# Patient Record
Sex: Male | Born: 1995 | Hispanic: No
Health system: Midwestern US, Community
[De-identification: ages and names within clinical notes are randomized; demographics above are authoritative.]

## PROBLEM LIST (undated history)

## (undated) ENCOUNTER — Emergency Department (HOSPITAL_COMMUNITY): Admission: EM | Payer: Self-pay

## (undated) DIAGNOSIS — J302 Other seasonal allergic rhinitis: Secondary | ICD-10-CM

## (undated) DIAGNOSIS — F201 Disorganized schizophrenia: Secondary | ICD-10-CM

## (undated) DIAGNOSIS — F5101 Primary insomnia: Secondary | ICD-10-CM

## (undated) HISTORY — PX: TONSILLECTOMY: SUR1361

---
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
## 2019-05-31 DIAGNOSIS — F29 Unspecified psychosis not due to a substance or known physiological condition: Secondary | ICD-10-CM

## 2019-05-31 HISTORY — DX: Unspecified psychosis not due to a substance or known physiological condition: F29

## 2019-06-14 NOTE — ED Notes (Signed)
 ED Patient Summary              [facility] Emergency Department  [address], [place] [address]  [phone]  Discharge Instructions (Patient)  _______________________________________     Name: Dale Maldonado, Dale Maldonado  DOB: 1995-11-21                   MRN: [id number]                   FIN: [id number]  Reason For Visit: WANTS SURGERY ON FINGERS- HAS NO PAIN  Final Diagnosis:      Visit Date: 06/14/2019 17:21:00  Address:   Phone:      Emergency Department Providers:         Primary Physician:            [facility] would like to thank you for allowing us  to assist you with your healthcare needs. The following includes patient education materials and information regarding your injury/illness.     Follow-up Instructions:  You were seen today on an emergency basis. Please contact your primary care doctor for a follow up appointment. If you received a referral to a specialist doctor, it is important you follow-up as instructed.    It is important that you call your follow-up doctor to schedule and confirm the location of your next appointment. Your doctor may practice at multiple locations. The office location of your follow-up appointment may be different to the one written on your discharge instructions.    If you do not have a primary care doctor, please call [phone] for help in finding a [facility] Provider. For help in finding a specialist doctor, please call [phone].    The [facility] "Ask a Nurse" line in staffed by Registered Nurses and is a free service to the community. We are available Monday - Friday from 8am to 5pm to answer your questions about your health. Please call [phone].    If your condition gets worse before your follow-up with your primary care doctor or specialist, please return to the Emergency Department.        Coronavirus 2019 (COVID-19) Reminders:     Patients 65 and older can contact their [facility] doctors' offices to schedule appointments to receive the COVID-19 vaccine at the [facility] or send us  an email at [email or web address].            Scan this code with your phone camera to send an email to the address above.      Patients who are 81 and older who do not have a [facility] physician can call [phone] to schedule vaccination appointments.         Follow Up Appointments:  Primary Care Provider:      Name: PCP,  NONE      Phone:           Printed Prescriptions:    Patient Education Materials:  Discharge Orders       Comment:             Allergy Info:      Medication Information:  [facility] provided you with a complete list of medications post discharge, if you have been instructed to stop taking a medication please ensure you also follow up with this information to your Primary Care Physician.  Unless otherwise noted, patient will continue to  take medications as prescribed prior to the Emergency Room visit.  Any specific questions regarding your chronic medications and dosages should be discussed with your physician(s) and pharmacist.          No Medications Documented      Medications Administered During Visit:       Major Tests and Procedures:  The following procedures and tests were performed during your ED visit.  COMMON PROCEDURES%>  COMMON PROCEDURES COMMENTS%>          Laboratory Orders  No laboratory orders were placed.              Radiology Orders  No radiology orders were placed.              Patient Care Orders  Name Status Details   ED Triage Adult Ordered 06/14/19 17:21:51 EST, 06/14/19 17:21:51 EST       ---------------------------------------------------------------------------------------------------------------------  [facility]) encourages you to self-enroll in the [facility] Patient Portal.  [facility] Patient Portal will allow you to manage your personal health information  securely from your own electronic device now and in the future.  To begin your Patient Portal enrollment process, please visit [email or web address] Click on "Sign up now" under [facility].  If you find that you need additional assistance on the [facility] Patient Portal or need a copy of your medical records, please call the [facility] Medical Records Office at [phone].  Comment:

## 2019-06-14 NOTE — ED Notes (Signed)
ED Patient Education Note     Patient Education Materials Follows:

## 2019-06-14 NOTE — Discharge Summary (Signed)
ED Clinical Summary                     [facility]  [address]  [place], [place] [address]  [phone]          PERSON INFORMATION  Name: Dale Maldonado, Dale Maldonado Age:  24 Years DOB: 1995/10/22   Sex: Male Language:  PCP: PCP,  NONE   Marital Status:  Phone:  Med Service: MED-Medicine   MRN: [id number] Acct# [id number] Arrival: 06/14/2019 17:21:00   Visit Reason: WANTS SURGERY ON FINGERS- HAS NO PAIN Acuity:  LOS: 000 00:13   Address:       Diagnosis:      Medications:    Medications Administered During Visit:              Allergies      No Allergies Documented      Major Tests and Procedures:  The following procedures and tests were performed during your ED visit.  COMMON PROCEDURES%>  COMMON PROCEDURES COMMENTS%>                PROVIDER INFORMATION     Attending Physician:  DEFAULT,  DOCTOR      Admit Doc  DEFAULT,  DOCTOR     Consulting Doc       VITALS INFORMATION  Vital Sign Triage Latest   Temp Oral ORAL_1%> ORAL%>   Temp Temporal TEMPORAL_1%> TEMPORAL%>   Temp Intravascular INTRAVASCULAR_1%> INTRAVASCULAR%>   Temp Axillary AXILLARY_1%> AXILLARY%>   Temp Rectal RECTAL_1%> RECTAL%>   02 Sat     Respiratory Rate RATE_1%> RATE%>   Peripheral Pulse Rate PULSE RATE_1%> PULSE RATE%>   Apical Heart Rate HEART RATE_1%> HEART RATE%>   Blood Pressure BLOOD PRESSURE_1%>/ BLOOD PRESSURE_1%> BLOOD PRESSURE%> / BLOOD PRESSURE%>                 Immunizations      No Immunizations Documented This Visit          DISCHARGE INFORMATION   Discharge Disposition: L Left Without Treatment   Discharge Location:  Left prior to triage   Discharge Date and Time:  06/14/2019 17:34:10   ED Checkout Date and Time:  06/14/2019 17:34:10     DEPART REASON INCOMPLETE INFORMATION               Depart Action Incomplete Reason   Interactive View/I&O Patient left prior to Dr exam   Diagnosis Patient left prior to Dr exam   Patient Education Patient left prior to Dr exam   Follow-up Patient left prior to Dr exam   Nursing  Admit/Discharge/Transfer Summary Patient left prior to Dr exam   Patient Understanding Patient left prior to Dr exam               Problems      No Problems Documented              Smoking Status      No Smoking Status Documented         PATIENT EDUCATION INFORMATION  Instructions:          Follow up:            ED PROVIDER DOCUMENTATION

## 2019-06-15 NOTE — ED Notes (Signed)
ED Triage Note       ED Secondary Triage Entered On:  06/15/2019 23:58 EST    Performed On:  06/15/2019 23:57 EST by Cliffton Asters, RN, Reola Mosher               General Information   Barriers to Learning :   Lack of insight   ED Home Meds Section :   Document assessment   [facility] ED Fall Risk Section :   Document assessment   ED Advance Directives Section :   Document assessment   ED Palliative Screen :   N/A (prefilled for <23yo)   Cliffton Asters RN, Reola Mosher - 06/15/2019 23:57 EST   (As Of: 06/15/2019 23:58:18 EST)   Diagnoses(Active)    Psychiatric problem  Date:   06/15/2019 ; Diagnosis Type:   Reason For Visit ; Confirmation:   Complaint of ; Clinical Dx:   Psychiatric problem ; Classification:   Medical ; Clinical Service:   Emergency medicine ; Code:   PNED ; Probability:   0 ; Diagnosis Code:   [id number]             -    Procedure History   (As Of: 06/15/2019 23:58:18 EST)     Phoebe Perch Fall Risk Assessment Tool   Hx of falling last 3 months ED Fall :   No   Patient confused or disoriented ED Fall :   No   Patient intoxicated or sedated ED Fall :   No   Patient impaired gait ED Fall :   No   Use a mobility assistance device ED Fall :   No   Patient altered elimination ED Fall :   No   [facility] ED Fall Score :   0    Guss Bunde - 06/15/2019 23:57 EST   ED Advance Directive   Advance Directive :   No   White, RN, Reola Mosher - 06/15/2019 23:57 EST   Med Hx   Medication List   (As Of: 06/15/2019 23:58:18 EST)   Normal Order    diphenhydrAMINE 50 mg/mL Inj Soln 1 mL  :   diphenhydrAMINE 50 mg/mL Inj Soln 1 mL ; Status:   Ordered ; Ordered As Mnemonic:   Benadryl ; Simple Display Line:   25 mg, 0.5 mL, IM, Once ; Ordering Provider:   Lucia Estelle E; Catalog Code:   diphenhydrAMINE ; Order Dt/Tm:   06/15/2019 22:28:41 EST          haloperidol 5 mg/mL Inj Soln 1 mL  :   haloperidol 5 mg/mL Inj Soln 1 mL ; Status:   Ordered ; Ordered As Mnemonic:   Haldol ; Simple Display Line:   5 mg, 1 mL, IM, Once ;  Ordering Provider:   Lucia Estelle E; Catalog Code:   haloperidol ; Order Dt/Tm:   06/15/2019 22:28:10 EST          LORazepam 2 mg/mL Inj Soln 1 mL  :   LORazepam 2 mg/mL Inj Soln 1 mL ; Status:   Ordered ; Ordered As Mnemonic:   Ativan ; Simple Display Line:   1 mg, 0.5 mL, IM, Once ; Ordering Provider:   Lucia Estelle E; Catalog Code:   LORazepam ; Order Dt/Tm:   06/15/2019 22:28:10 EST
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
## 2019-06-16 LAB — CBC WITH AUTO DIFFERENTIAL
Hematocrit: 46.2 % (ref 38.0–52.0)
Hemoglobin: 16 g/dL (ref 13.0–17.3)
MCH: 30.2 pg (ref 27.0–34.5)
MCHC: 34.6 g/dL (ref 32.0–36.0)
MCV: 87.2 fL (ref 84.0–100.0)
MPV: 11.1 fL (ref 7.2–13.2)
NRBC Absolute: 0 10*3/uL (ref 0.000–0.012)
NRBC Automated: 0 % (ref 0.0–0.2)
Platelets: 249 10*3/uL (ref 140–440)
RBC: 5.3 x10e6/mcL (ref 4.00–5.60)
RDW: 12.4 % (ref 11.0–16.0)
WBC: 4.3 10*3/uL (ref 3.8–10.6)

## 2019-06-16 LAB — BASIC METABOLIC PANEL
Anion Gap: 12 mmol/L (ref 2–17)
BUN: 9 mg/dL (ref 6–20)
CO2: 26 mmol/L (ref 22–29)
Calcium: 9.3 mg/dL (ref 8.6–10.0)
Chloride: 102 mmol/L (ref 98–107)
Creatinine: 0.6 mg/dL — ABNORMAL LOW (ref 0.7–1.3)
GFR African American: 164 mL/min/{1.73_m2} (ref >=90)
GFR Non-African American: 142 mL/min/{1.73_m2} (ref >=90)
Glucose: 86 mg/dL (ref 70–99)
OSMOLALITY CALCULATED: 277 mOsm/kg (ref 270–287)
Potassium: 3.8 mmol/L (ref 3.5–5.3)
Sodium: 140 mmol/L (ref 135–145)

## 2019-06-16 LAB — URINE DRUG SCREEN
Amphetamine Screen, Urine: NEGATIVE
Barbiturate Screen, Ur: NEGATIVE
Benzodiazepine Screen, Urine: NEGATIVE
Cannabinoid Scrn, Ur: NEGATIVE
Cocaine Screen Urine: NEGATIVE
Opiate Screen, Urine: NEGATIVE

## 2019-06-16 LAB — HBSAG: Hepatitis B Surface Ag: NONREACTIVE

## 2019-06-16 LAB — HIV 1/2 AG/AB COMBO: HIV Antigen Antibody: NONREACTIVE

## 2019-06-16 LAB — HEPATITIS B CORE ANTIBODY, IGM: Hep B Core Ab, IgM: NONREACTIVE

## 2019-06-16 LAB — HEPATITIS C ANTIBODY: Hep C Virus Ab: NONREACTIVE

## 2019-06-16 LAB — ETHANOL: Ethanol Lvl: 42.8 mg/dL — ABNORMAL HIGH (ref 0.0–10.0)

## 2019-06-16 LAB — HEPATITIS A ANTIBODY, IGM: Hep A IgM: NONREACTIVE

## 2019-06-16 NOTE — ED Notes (Signed)
 ED Note-Nursing               Pt became agitated when he asked to leave and he was told that he can not leave at this time.  Pt stated   Fine then, I'm just going to kill myself if y'all don't let me leave.  Pt denied a plan for SI; EOC conducted, and security at bedside aware of his verbal statement.  Case management notified of his statement.   Signature [employer]

## 2019-06-16 NOTE — ED Provider Notes (Signed)
Addendum *ED        Patient:   Dale Maldonado, Dale Maldonado             MRN: [id number]            FIN: [id number]               Age:   24 years     Sex:  Male     DOB:  11/28/1995   Associated Diagnoses:   Psychoses   Author:   Caleb Popp      Basic Information   Addendum: Time of addendum:: 06/16/2019 16:06:00 , Assumed care from: Kevin Fenton, Time  06/16/2019 06:00:00, Pertinent history: Patient awaiting consult.      Medical Decision Making   Results review:  Lab results : Lab View   06/15/2019 23:45 EST Estimated Creatinine Clearance 159.79 mL/min   06/15/2019 23:08 EST WBC 4.3 x10e3/mcL    RBC 5.30 x10e6/mcL    Hgb 16.0 g/dL    HCT 46.2 %    MCV 87.2 fL    MCH 30.2 pg    MCHC 34.6 g/dL    RDW 12.4 %    Platelet 249 x10e3/mcL    MPV 11.1 fL    NRBC Absolute Auto 0.000 x10e3/mcL    NRBC Percent Auto 0.0 %    Sodium Lvl 140 mmol/L    Potassium Lvl 3.8 mmol/L    Chloride 102 mmol/L    CO2 26 mmol/L    Glucose Random 86 mg/dL    BUN 9 mg/dL    Creatinine Lvl 0.6 mg/dL  LOW    AGAP 12 mmol/L    Osmolality Calc 277 mOsm/kg    Calcium Lvl 9.3 mg/dL    eGFR AA 164 mL/min/1.24m???    eGFR Non-AA 142 mL/min/1.747m??    Hep A IGM Ab Non-Reactive    HepBCore IGM Ab Non-Reactive    Hep B Surf Ag Non-Reactive    Hep C Ab Non-Reactive    HIV AG AB Nonreactive    Ethanol Lvl 42.8 mg/dL  HI    Amphetamine U Negative    Barbiturate U Negative    Benzodiazepin U Negative    Cannabinoid U Negative    Cocaine U Negative    Opiate U Negative   .      Reexamination/ Reevaluation   Vital signs   Basic Oxygen Information   06/16/2019 13:41 EST Oxygen Therapy Room air    SpO2 100 %   06/16/2019 7:50 EST Oxygen Therapy Room air    SpO2 100 %   06/15/2019 23:58 EST Oxygen Therapy Room air   06/15/2019 22:21 EST Oxygen Therapy Room air    SpO2 100 %      This patient with a known history of schizophrenia presented to the emergency room last night at which time he was found to be combative and agitated on part 1 papers from mobile crisis.  Patient had to  receive 25 mg of Benadryl parenterally, 1 mg of Haldol parenterally and otherwise 1 mg of Ativan parenterally.  Patient calmed down through the night and was assessed by psychiatrist on call including Dr. CoZola Buttonho felt that the patient lacked insight and judgment otherwise with noncompliance of medications at home.  She wanted the patient committed and the patient was committed however we were unable to find a facility on this patient i as he is uninsured.  Patient will be kept under observation until reevaluation in the morning for placement.  At the time of this note patient has part 1 commitment papers in part 2 commitment papers were done by me.      Impression and Plan   Diagnosis   Psychoses (ICD10-CM F29, Discharge, Medical)   Plan   Condition: Stable.    Disposition: Patient care transitioned to: Time: 06/16/2019 16:09:00, PARASCHOS-MD,  THEODORE.    Signature Line     Electronically Signed on 06/16/2019 04:09 PM EST   ________________________________________________   Caleb Popp

## 2019-06-16 NOTE — Case Communication (Signed)
 CM Discharge Planning Assessment - Text       CM Progress Note Entered On:  06/16/2019 18:37 EST    Performed On:  06/16/2019 18:25 EST by [facility]ERW[facility]ICE               CM Progress Note   CM Home/Lay Caregiver Name/Relationship :   Dale Maldonado  Dale Maldonado-[phone]   CM Progress Note :   06/16/2019 KJM/MSW consultation requested by MD for disposition of care needs.  Patient reportedly presenting from home with increased aggression and altered mental status.  Patient assessed by Dr. Heyward and placement indicated.  MSW initiated bed search and spoke with the patient's parents.  Mother and Father reporting confusion about the patient's behavior, talking out of his head, running away from the house, increased aggression and irritability.  MSW inquired if there had been a history of similar presentation which initially was denied but Father then did note a diagosis of Schizophrenia in the past and a hospitalization.  MSW informed them of MD plan of care.  Family asking about long term length of stay and MSW indicated stabilization is generally short term.     MSW unable to secure a bed for the patient.  [facility] had no beds and 12 holding in their ED.  [facility] was not able to review the chart because of their staffing issues.  No response from other facilities.  Patient is unfunded which is a barrier for placement as many facilities will not accept charity placement.  Will continue to work on placement.  Patient notified and parents updated that patient will remain in the ED overnight.  Further disposition pending.     Dale Maldonado - 06/16/2019 18:25 EST

## 2019-06-16 NOTE — ED Notes (Signed)
ED Note-Nursing               Per night shift hand off communication, pt was agreeable with staff in ED and was not given medications; provider notified by staff over night.    Signature [employer]

## 2019-06-17 LAB — COVID-19, SURVEILLANCE (ASYMPTOMATIC/NO EXPOSURE, OR TEST OF CURE)
Lot/Kit Number: 706271
SARS Cov2 Ag FIA: NEGATIVE

## 2019-06-17 NOTE — ED Provider Notes (Signed)
Addendum *ED        Patient:   Dale Maldonado, Dale Maldonado             MRN: [id number]            FIN: [id number]               Age:   24 years     Sex:  Male     DOB:  11/10/1995   Associated Diagnoses:   None   Author:   Yolanda Manges      Basic Information   Addendum: Time of addendum:: 06/17/2019 23:05:00 .      Medical Decision Making   Notes:  Pt still awaiting psych placement.  He is in no distress and will be watched overnight.[employer]ed forces training and education officer Line     Electronically Signed on 06/17/2019 11:06 PM EST   ________________________________________________   Yolanda Manges

## 2019-06-17 NOTE — ED Provider Notes (Signed)
Addendum *ED        Patient:   Dale Maldonado, Dale Maldonado             MRN: [id number]            FIN: [id number]               Age:   24 years     Sex:  Male     DOB:  May 19, 1995   Associated Diagnoses:   None   Author:   Kassie Mends      Basic Information   Addendum: Pertinent history: Patient signed out to me awaiting placement for psychiatric care no complaints through the night as such patient.to my successor pending case management and psychiatric disposition plan on part 1 part 2.      Reexamination/ Reevaluation   Vital signs   Basic Oxygen Information   06/16/2019 13:41 EST Oxygen Therapy Room air    SpO2 100 %   06/16/2019 7:50 EST Oxygen Therapy Room air    SpO2 100 %      Signature Line     Electronically Signed on 06/17/2019 01:47 AM EST   ________________________________________________   Kassie Mends, MD

## 2019-06-17 NOTE — Case Communication (Signed)
 CM Discharge Planning Assessment - Text       CM Progress Note Entered On:  06/17/2019 19:28 EST    Performed On:  06/17/2019 19:24 EST by [facility]               CM Progress Note   CM Home/Lay Caregiver Name/Relationship :   Orval Dortch  Zenineh Ochsner-Mother-[phone]   CM Progress Note :   06/16/2019 KJM/MSW consultation requested by MD for disposition of care needs.  Patient reportedly presenting from home with increased aggression and altered mental status.  Patient assessed by Dr. Heyward and placement indicated.  MSW initiated bed search and spoke with the patient's parents.  Mother and Father reporting confusion about the patient's behavior, talking out of his head, running away from the house, increased aggression and irritability.  MSW inquired if there had been a history of similar presentation which initially was denied but Father then did note a diagosis of Schizophrenia in the past and a hospitalization.  MSW informed them of MD plan of care.  Family asking about long term length of stay and MSW indicated stabilization is generally short term.     MSW unable to secure a bed for the patient.  [facility] had no beds and 12 holding in their ED.  [facility] was not able to review the chart because of their staffing issues.  No response from other facilities.  Patient is unfunded which is a barrier for placement as many facilities will not accept charity placement.  Will continue to work on placement.  Patient notified and parents updated that patient will remain in the ED overnight.  Further disposition pending.    06/17/2019 KJM/MSW follow up  consultation for placement of patient.  MSW resent referrals.  MSW spoke with [facility] and [facility], neither of them have any beds.  Options are limited due to lack of funding but will continue efforts.    Extensive phone calls this morning with the patient's father and then the patient's Mother.  MSW inquired if there had a concern about drugs in  the past and Father thought there may have been but he wasn't certain.  MSW inquired about symptoms:  He noted that the patient has no feelings one way or the other-noting lack of response when a friend died; he described the patient's moods as erratic and variable; he noted the impulsivity, poor judgement and elopement; he was not aware of any active hallucinations. He reported that over the past few weeks the patient's personal hygiene has declined and he will wear the same dirty clothes all week.  He reportedly will not bathe or brush his teeth.  Father denied that they were fearful for their safety in the home and reported that the patient has not been violent with them.  He continues to support placement and medications; MSW addressed limitations and with regard to medication noted, addressed how ownership of medication compliance will be with the patient which has posed problems in the past for compliance.  Patient's Mother continues to plead to see her son and MSW updates as often as able sometimes 3 times in the day.  MSW reviewed efforts to maintain patient's calm environment and paperwork.  He again notes that there was no indication of any of these behaviors when the patient was younger and references back to early adulthood with the onset.  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  MSW provided education and support regarding this diagnosis as the patient has refused medications in the past.    The patient remains evasive about his situation and presenting issues.  He was able to deescalated and has a moderate level of agitation/irritability.    1309 KJM/MSW follow up with [facility]; they have no beds and have one unit closed.  [facility]; they do not have any beds; [facility]: no beds.  [facility]-requested them to assess patient.  [facility]-no response.  Limitations are funding and lack of available beds.    1840 Bed situation reassessed in the  afternoon and again in the evening.  Patient restless about on going delays and assessed again by Dr. Andrey, who also conferred with his Father who came to the hospital.  Patient noted to be responding to internal stimuli during MD assessment this evening, which had not been noted earlier.  He continues to deny that he has any mental health issues and that he just was trying to leave this parent's home because they are so controlling.  However, the patient can not produce any other reliable party and no safety plan able to establish.  Per MD request, CM resubmitted referrals again to [facility], [facility] and [facility].  [facility] again has no beds and neither does [facility].  [facility] considering but family requesting closer to home.  Awaiting follow up from [facility] and then will notify family of acceptances.  Further disposition pending.     1924 [facility] has no beds and although [facility] has no charity beds, the patient's Father had thought he could do self pay. However, [facility] requires $4000.00 down and $980 a day; the upfront costs are not an option for the patient's Father who works as a Lawyer.  He has a PhD in Building surveyor but he is retired.  MSW noted that we would explore the options for beds.  Family remains focused on beds in [place] despite CM efforts to educate them regarding the limitations therein.  SBAR to Dr. Conny, Dr. Andrey and CM Dagoberto for further coordination of care needs.      [name],  [name] - 06/17/2019 19:24 EST

## 2019-06-17 NOTE — Case Communication (Signed)
 CM Discharge Planning Assessment - Text       CM Progress Note Entered On:  06/17/2019 10:48 EST    Performed On:  06/17/2019 10:35 EST by [facility]               CM Progress Note   CM Home/Lay Caregiver Name/Relationship :   Dale Maldonado  Dale Maldonado-[phone]   CM Progress Note :   06/16/2019 KJM/MSW consultation requested by MD for disposition of care needs.  Patient reportedly presenting from home with increased aggression and altered mental status.  Patient assessed by Dr. Heyward and placement indicated.  MSW initiated bed search and spoke with the patient's parents.  Mother and Father reporting confusion about the patient's behavior, talking out of his head, running away from the house, increased aggression and irritability.  MSW inquired if there had been a history of similar presentation which initially was denied but Father then did note a diagosis of Schizophrenia in the past and a hospitalization.  MSW informed them of MD plan of care.  Family asking about long term length of stay and MSW indicated stabilization is generally short term.     MSW unable to secure a bed for the patient.  [facility] had no beds and 12 holding in their ED.  [facility] was not able to review the chart because of their staffing issues.  No response from other facilities.  Patient is unfunded which is a barrier for placement as many facilities will not accept charity placement.  Will continue to work on placement.  Patient notified and parents updated that patient will remain in the ED overnight.  Further disposition pending.    06/17/2019 KJM/MSW follow up  consultation for placement of patient.  MSW resent referrals.  MSW spoke with [facility] and [facility], neither of them have any beds.  Options are limited due to lack of funding but will continue efforts.    Extensive phone calls this morning with the patient's father and then the patient's Mother.  MSW inquired if there had a concern about drugs in  the past and Father thought there may have been but he wasn't certain.  MSW inquired about symptoms:  He noted that the patient has no feelings one way or the other-noting lack of response when a friend died; he described the patient's moods as erratic and variable; he noted the impulsivity, poor judgement and elopement; he was not aware of any active hallucinations. He reported that over the past few weeks the patient's personal hygiene has declined and he will wear the same dirty clothes all week.  He reportedly will not bathe or brush his teeth.  Father denied that they were fearful for their safety in the home and reported that the patient has not been violent with them.  He continues to support placement and medications; MSW addressed limitations and with regard to medication noted, addressed how ownership of medication compliance will be with the patient which has posed problems in the past for compliance.  Patient's Mother continues to plead to see her son and MSW updates as often as able sometimes 3 times in the day.  MSW reviewed efforts to maintain patient's calm environment and paperwork.  He again notes that there was no indication of any of these behaviors when the patient was younger and references back to early adulthood with the onset.  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  MSW provided education and support regarding this diagnosis as the patient has refused medications in the past.    The patient remains evasive about his situation and presenting issues.  He was able to deescalated and has a moderate level of agitation/irritability.     Dale Maldonado - 06/17/2019 10:35 EST

## 2019-06-17 NOTE — Case Communication (Signed)
 CM Discharge Planning Assessment - Text       CM Progress Note Entered On:  06/17/2019 13:11 EST    Performed On:  06/17/2019 13:09 EST by [facility] SERV[facility]               CM Progress Note   CM Home/Lay Caregiver Name/Relationship :   Warwick Nick  Zenineh Gesner-Mother-[phone]   CM Progress Note :   06/16/2019 KJM/MSW consultation requested by MD for disposition of care needs.  Patient reportedly presenting from home with increased aggression and altered mental status.  Patient assessed by Dr. Heyward and placement indicated.  MSW initiated bed search and spoke with the patient's parents.  Mother and Father reporting confusion about the patient's behavior, talking out of his head, running away from the house, increased aggression and irritability.  MSW inquired if there had been a history of similar presentation which initially was denied but Father then did note a diagosis of Schizophrenia in the past and a hospitalization.  MSW informed them of MD plan of care.  Family asking about long term length of stay and MSW indicated stabilization is generally short term.     MSW unable to secure a bed for the patient.  [facility] had no beds and 12 holding in their ED.  [facility] was not able to review the chart because of their staffing issues.  No response from other facilities.  Patient is unfunded which is a barrier for placement as many facilities will not accept charity placement.  Will continue to work on placement.  Patient notified and parents updated that patient will remain in the ED overnight.  Further disposition pending.    06/17/2019 KJM/MSW follow up  consultation for placement of patient.  MSW resent referrals.  MSW spoke with [facility] and [facility], neither of them have any beds.  Options are limited due to lack of funding but will continue efforts.    Extensive phone calls this morning with the patient's father and then the patient's Mother.  MSW inquired if there had a concern about drugs in  the past and Father thought there may have been but he wasn't certain.  MSW inquired about symptoms:  He noted that the patient has no feelings one way or the other-noting lack of response when a friend died; he described the patient's moods as erratic and variable; he noted the impulsivity, poor judgement and elopement; he was not aware of any active hallucinations. He reported that over the past few weeks the patient's personal hygiene has declined and he will wear the same dirty clothes all week.  He reportedly will not bathe or brush his teeth.  Father denied that they were fearful for their safety in the home and reported that the patient has not been violent with them.  He continues to support placement and medications; MSW addressed limitations and with regard to medication noted, addressed how ownership of medication compliance will be with the patient which has posed problems in the past for compliance.  Patient's Mother continues to plead to see her son and MSW updates as often as able sometimes 3 times in the day.  MSW reviewed efforts to maintain patient's calm environment and paperwork.  He again notes that there was no indication of any of these behaviors when the patient was younger and references back to early adulthood with the onset.  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  MSW provided education and support regarding this diagnosis as the patient has refused medications in the past.    The patient remains evasive about his situation and presenting issues.  He was able to deescalated and has a moderate level of agitation/irritability.    1309 KJM/MSW follow up with [facility]; they have no beds and have one unit closed.  [facility]; they do not have any beds; [facility]: no beds.  [facility]-requested them to assess patient.  [facility]-no response.  Limitations are funding and lack of available beds.     [name],  [name] - 06/17/2019 13:09 EST

## 2019-06-17 NOTE — ED Notes (Signed)
ED Note-Nursing       ED RN Reassessment Entered On:  06/17/2019 19:06 EST    Performed On:  06/17/2019 19:05 EST by Milagros Evener, RN, BLAIR A               ED RN Reassessment   ED RN Progress Note :   Report from [place], [place], assumed care of pt.   Milagros Evener, RN, BLAIR A - 06/17/2019 19:05 EST

## 2019-06-17 NOTE — Case Communication (Signed)
 CM Discharge Planning Assessment - Text       CM Progress Note Entered On:  06/17/2019 18:45 EST    Performed On:  06/17/2019 18:39 EST by [facility] SERV[facility]               CM Progress Note   CM Home/Lay Caregiver Name/Relationship :   Arkel Cartwright  Zenineh Ratchford-Mother-[phone]   CM Progress Note :   06/16/2019 KJM/MSW consultation requested by MD for disposition of care needs.  Patient reportedly presenting from home with increased aggression and altered mental status.  Patient assessed by Dr. Heyward and placement indicated.  MSW initiated bed search and spoke with the patient's parents.  Mother and Father reporting confusion about the patient's behavior, talking out of his head, running away from the house, increased aggression and irritability.  MSW inquired if there had been a history of similar presentation which initially was denied but Father then did note a diagosis of Schizophrenia in the past and a hospitalization.  MSW informed them of MD plan of care.  Family asking about long term length of stay and MSW indicated stabilization is generally short term.     MSW unable to secure a bed for the patient.  [facility] had no beds and 12 holding in their ED.  [facility] was not able to review the chart because of their staffing issues.  No response from other facilities.  Patient is unfunded which is a barrier for placement as many facilities will not accept charity placement.  Will continue to work on placement.  Patient notified and parents updated that patient will remain in the ED overnight.  Further disposition pending.    06/17/2019 KJM/MSW follow up  consultation for placement of patient.  MSW resent referrals.  MSW spoke with [facility] and [facility], neither of them have any beds.  Options are limited due to lack of funding but will continue efforts.    Extensive phone calls this morning with the patient's father and then the patient's Mother.  MSW inquired if there had a concern about drugs in  the past and Father thought there may have been but he wasn't certain.  MSW inquired about symptoms:  He noted that the patient has no feelings one way or the other-noting lack of response when a friend died; he described the patient's moods as erratic and variable; he noted the impulsivity, poor judgement and elopement; he was not aware of any active hallucinations. He reported that over the past few weeks the patient's personal hygiene has declined and he will wear the same dirty clothes all week.  He reportedly will not bathe or brush his teeth.  Father denied that they were fearful for their safety in the home and reported that the patient has not been violent with them.  He continues to support placement and medications; MSW addressed limitations and with regard to medication noted, addressed how ownership of medication compliance will be with the patient which has posed problems in the past for compliance.  Patient's Mother continues to plead to see her son and MSW updates as often as able sometimes 3 times in the day.  MSW reviewed efforts to maintain patient's calm environment and paperwork.  He again notes that there was no indication of any of these behaviors when the patient was younger and references back to early adulthood with the onset.  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  MSW provided education and support regarding this diagnosis as the patient has refused medications in the past.    The patient remains evasive about his situation and presenting issues.  He was able to deescalated and has a moderate level of agitation/irritability.    1309 KJM/MSW follow up with [facility]; they have no beds and have one unit closed.  [facility]; they do not have any beds; [facility]: no beds.  [facility]-requested them to assess patient.  [facility]-no response.  Limitations are funding and lack of available beds.    1840 Bed situation reassessed in the  afternoon and again in the evening.  Patient restless about on going delays and assessed again by Dr. Andrey, who also conferred with his Father who came to the hospital.  Patient noted to be responding to internal stimuli during MD assessment this evening, which had not been noted earlier.  He continues to deny that he has any mental health issues and that he just was trying to leave this parent's home because they are so controlling.  However, the patient can not produce any other reliable party and no safety plan able to establish.  Per MD request, CM resubmitted referrals again to [facility] and [facility].  [facility] again has no beds and neither does [facility].  [facility] considering but family requesting closer to home.  Awaiting follow up from [facility] and then will notify family of acceptances.  Further disposition pending.      [name],  [name] - 06/17/2019 18:39 EST

## 2019-06-17 NOTE — ED Notes (Signed)
ED Note-Nursing       ED RN Reassessment Entered On:  06/17/2019 20:40 EST    Performed On:  06/17/2019 20:40 EST by Milagros Evener, RN, BLAIR A               ED RN Reassessment   ED Patient condition :   No change from previous assessment   ED Behavioral Activity Rating Scale :   4 - Quiet and awake (normal level of activity)   ED RN Progress Note :   Sandwich, chips and drink provided at pt request.   Milagros Evener, RN, BLAIR A - 06/17/2019 20:40 EST

## 2019-06-17 NOTE — ED Provider Notes (Signed)
Addendum *ED        Patient:   Dale Maldonado, Dale Maldonado             MRN: [id number]            FIN: [id number]               Age:   24 years     Sex:  Male     DOB:  [birth year]   Associated Diagnoses:   None   Author:   Grayce Sessions,  Braiden Presutti HOWARD      Medical Decision Making   Patient was signed out to me change of shift pending the completion formal psychiatric evaluation.  Patient was seen 2 times over the course of the day by Dr. Janina Mayo from department of psychiatry and it was felt that he does require inpatient placement.  Patient is under a part 1 and part 2 commitment.  Patient has not required additional medication however his behavior has become somewhat labile.  Ultimately patient was signed out to oncoming physician pending bed search.      Reexamination/ Reevaluation   Vital signs   Basic Oxygen Information   06/17/2019 8:00 EST Oxygen Therapy Room air    SpO2 99 %   06/17/2019 2:08 EST Oxygen Therapy Room air    SpO2 100 %   06/16/2019 13:41 EST Oxygen Therapy Room air    SpO2 100 %   06/16/2019 7:50 EST Oxygen Therapy Room air    SpO2 100 %      Signature Line     Electronically Signed on 06/17/2019 03:54 PM EST   ________________________________________________   Grayce Sessions,  Dolan Xia HOWARD

## 2019-06-18 NOTE — ED Provider Notes (Signed)
Addendum *ED        Patient:   BOLTON, CANUPP             MRN: [id number]            FIN: [id number]               Age:   24 years     Sex:  Male     DOB:  1995/08/04   Associated Diagnoses:   Psychoses   Author:   Jessy Oto      Medical Decision Making   Patient was signed out to me at change of pending completion of bed search.  This patient unfortunately is on his third day of awaiting bed search at the present time.  Unfortunately no beds are available at the present time and after patient was seen by psychiatry earlier today he was deemed to be too labile for discharge to home.  At this time the patient will be signed out to oncoming physician for further management.      Reexamination/ Reevaluation   Vital signs   Basic Oxygen Information   06/18/2019 8:21 EST Oxygen Therapy Room air    SpO2 100 %   06/17/2019 19:24 EST Oxygen Therapy Room air    SpO2 99 %   06/17/2019 8:00 EST Oxygen Therapy Room air    SpO2 99 %   06/17/2019 2:08 EST Oxygen Therapy Room air    SpO2 100 %         Impression and Plan   Diagnosis   Psychoses (ICD10-CM F29, Discharge, Medical)   Signature Line     Electronically Signed on 06/18/2019 03:19 PM EST   ________________________________________________   Grayce Sessions,  Keondra Haydu HOWARD

## 2019-06-18 NOTE — Case Communication (Signed)
 CM Discharge Planning Assessment - Text       CM Progress Note Entered On:  06/18/2019 11:30 EST    Performed On:  06/18/2019 11:27 EST by Dagoberto Fore Corinne               CM Progress Note   CM Home/Lay Caregiver Name/Relationship :   Osman Blinder-Father-[phone]  Zenineh Yjddjw-Fnuyzm-[phone]   CM Progress Note :   06/16/2019 KJM/MSW consultation requested by MD for disposition of care needs.  Patient reportedly presenting from home with increased aggression and altered mental status.  Patient assessed by Dr. Heyward and placement indicated.  MSW initiated bed search and spoke with the patient's parents.  Mother and Father reporting confusion about the patient's behavior, talking out of his head, running away from the house, increased aggression and irritability.  MSW inquired if there had been a history of similar presentation which initially was denied but Father then did note a diagosis of Schizophrenia in the past and a hospitalization.  MSW informed them of MD plan of care.  Family asking about long term length of stay and MSW indicated stabilization is generally short term.     MSW unable to secure a bed for the patient.  [facility] had no beds and 12 holding in their ED.  [facility] was not able to review the chart because of their staffing issues.  No response from other facilities.  Patient is unfunded which is a barrier for placement as many facilities will not accept charity placement.  Will continue to work on placement.  Patient notified and parents updated that patient will remain in the ED overnight.  Further disposition pending.    06/17/2019 KJM/MSW follow up  consultation for placement of patient.  MSW resent referrals.  MSW spoke with [facility] and [facility], neither of them have any beds.  Options are limited due to lack of funding but will continue efforts.    Extensive phone calls this morning with the patient's father and then the patient's Mother.  MSW inquired if there had a concern about  drugs in the past and Father thought there may have been but he wasn't certain.  MSW inquired about symptoms:  He noted that the patient has no feelings one way or the other-noting lack of response when a friend died; he described the patient's moods as erratic and variable; he noted the impulsivity, poor judgement and elopement; he was not aware of any active hallucinations. He reported that over the past few weeks the patient's personal hygiene has declined and he will wear the same dirty clothes all week.  He reportedly will not bathe or brush his teeth.  Father denied that they were fearful for their safety in the home and reported that the patient has not been violent with them.  He continues to support placement and medications; MSW addressed limitations and with regard to medication noted, addressed how ownership of medication compliance will be with the patient which has posed problems in the past for compliance.  Patient's Mother continues to plead to see her son and MSW updates as often as able sometimes 3 times in the day.  MSW reviewed efforts to maintain patient's calm environment and paperwork.  He again notes that there was no indication of any of these behaviors when the patient was younger and references back to early adulthood with the onset.  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  MSW provided education and support regarding this diagnosis as the patient has refused medications in the past.    The patient remains evasive about his situation and presenting issues.  He was able to deescalated and has a moderate level of agitation/irritability.    1309 KJM/MSW follow up with [facility]; they have no beds and have one unit closed.  [facility]; they do not have any beds; [facility]: no beds.  [facility]-requested them to assess patient.  [facility]-no response.  Limitations are funding and lack of available beds.    1840 Bed situation reassessed  in the afternoon and again in the evening.  Patient restless about on going delays and assessed again by Dr. Andrey, who also conferred with his Father who came to the hospital.  Patient noted to be responding to internal stimuli during MD assessment this evening, which had not been noted earlier.  He continues to deny that he has any mental health issues and that he just was trying to leave this parent's home because they are so controlling.  However, the patient can not produce any other reliable party and no safety plan able to establish.  Per MD request, CM resubmitted referrals again to [facility], [facility] and [facility].  [facility] again has no beds and neither does [facility].  [facility] considering but family requesting closer to home.  Awaiting follow up from [facility] and then will notify family of acceptances.  Further disposition pending.     1924 [facility] has no beds and although [facility] has no charity beds, the patient's Father had thought he could do self pay. However, [facility] requires $4000.00 down and $980 a day; the upfront costs are not an option for the patient's Father who works as a Lawyer.  He has a PhD in Building surveyor but he is retired.  MSW noted that we would explore the options for beds.  Family remains focused on beds in [place] despite CM efforts to educate them regarding the limitations therein.  SBAR to Dr. Conny, Dr. Andrey and CM Dagoberto for further coordination of care needs.     06/18/19  1128: [facility] still has no beds; [facility] will review again now and update cm with bed availability. cm rec'd call from [facility]- McKenzie who stated that pt's father called her upset bc someone called him and told him he was being discharged from ED due to no beds being availabile. cm updated MH that per [facility] placement was still needed and for cm to continue the pursut. Pt is being monitored and eval for possible bed at [facility] today as well.      Judy,  Deanie Corinne - 06/18/2019 11:27  EST

## 2019-06-18 NOTE — ED Notes (Signed)
ED Note-Nursing       ED RN Reassessment Entered On:  06/18/2019 21:22 EST    Performed On:  06/18/2019 20:15 EST by [facility], RN, ASHLEY C               ED RN Reassessment   Pasero Opioid Induced Sedation Scale :   1 = Awake and alert   ED Behavioral Activity Rating Scale :   6 - Extremely or continuously active, not requiring restraint   ED RN Progress Note :   Patient noted to be pacing room again, verbal redirection provided, patient appears to be escalating increasing agitation.     ED RN Misc Notes :   Security remains at bedside, safety maintained.    MALECKI, RN, ASHLEY C - 06/18/2019 21:19 EST

## 2019-06-18 NOTE — ED Provider Notes (Signed)
Addendum *ED        Patient:   Dale Maldonado, Dale Maldonado             MRN: [id number]            FIN: [id number]               Age:   24 years     Sex:  Male     DOB:  [birth year]   Associated Diagnoses:   None   Author:   Kassie Mends      Basic Information   Addendum: Pertinent history: Signed out to me pending placement by case management patient remained stable through the night signed out to my successor pending said case management.      Reexamination/ Reevaluation   Vital signs   Basic Oxygen Information   06/17/2019 19:24 EST Oxygen Therapy Room air    SpO2 99 %   06/17/2019 8:00 EST Oxygen Therapy Room air    SpO2 99 %   06/17/2019 2:08 EST Oxygen Therapy Room air    SpO2 100 %      Signature Line     Electronically Signed on 06/18/2019 02:11 AM EST   ________________________________________________   Kassie Mends, MD

## 2019-06-18 NOTE — Case Communication (Signed)
 CM Discharge Planning Assessment - Text       CM Progress Note Entered On:  06/18/2019 15:02 EST    Performed On:  06/18/2019 15:00 EST by Dagoberto Fore Corinne               CM Progress Note   CM Home/Lay Caregiver Name/Relationship :   Osman Lopezperez-Father-[phone]  Zenineh Yjddjw-Fnuyzm-[phone]   CM Progress Note :   06/16/2019 KJM/MSW consultation requested by MD for disposition of care needs.  Patient reportedly presenting from home with increased aggression and altered mental status.  Patient assessed by Dr. Heyward and placement indicated.  MSW initiated bed search and spoke with the patient's parents.  Mother and Father reporting confusion about the patient's behavior, talking out of his head, running away from the house, increased aggression and irritability.  MSW inquired if there had been a history of similar presentation which initially was denied but Father then did note a diagosis of Schizophrenia in the past and a hospitalization.  MSW informed them of MD plan of care.  Family asking about long term length of stay and MSW indicated stabilization is generally short term.     MSW unable to secure a bed for the patient.  [facility] had no beds and 12 holding in their ED.  [facility] was not able to review the chart because of their staffing issues.  No response from other facilities.  Patient is unfunded which is a barrier for placement as many facilities will not accept charity placement.  Will continue to work on placement.  Patient notified and parents updated that patient will remain in the ED overnight.  Further disposition pending.    06/17/2019 KJM/MSW follow up  consultation for placement of patient.  MSW resent referrals.  MSW spoke with [facility] and [facility], neither of them have any beds.  Options are limited due to lack of funding but will continue efforts.    Extensive phone calls this morning with the patient's father and then the patient's Mother.  MSW inquired if there had a concern about  drugs in the past and Father thought there may have been but he wasn't certain.  MSW inquired about symptoms:  He noted that the patient has no feelings one way or the other-noting lack of response when a friend died; he described the patient's moods as erratic and variable; he noted the impulsivity, poor judgement and elopement; he was not aware of any active hallucinations. He reported that over the past few weeks the patient's personal hygiene has declined and he will wear the same dirty clothes all week.  He reportedly will not bathe or brush his teeth.  Father denied that they were fearful for their safety in the home and reported that the patient has not been violent with them.  He continues to support placement and medications; MSW addressed limitations and with regard to medication noted, addressed how ownership of medication compliance will be with the patient which has posed problems in the past for compliance.  Patient's Mother continues to plead to see her son and MSW updates as often as able sometimes 3 times in the day.  MSW reviewed efforts to maintain patient's calm environment and paperwork.  He again notes that there was no indication of any of these behaviors when the patient was younger and references back to early adulthood with the onset.  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  MSW provided education and support regarding this diagnosis as the patient has refused medications in the past.    The patient remains evasive about his situation and presenting issues.  He was able to deescalated and has a moderate level of agitation/irritability.    1309 KJM/MSW follow up with [facility]; they have no beds and have one unit closed.  [facility]; they do not have any beds; [facility]: no beds.  [facility]-requested them to assess patient.  [facility]-no response.  Limitations are funding and lack of available beds.    1840 Bed situation reassessed  in the afternoon and again in the evening.  Patient restless about on going delays and assessed again by Dr. Andrey, who also conferred with his Father who came to the hospital.  Patient noted to be responding to internal stimuli during MD assessment this evening, which had not been noted earlier.  He continues to deny that he has any mental health issues and that he just was trying to leave this parent's home because they are so controlling.  However, the patient can not produce any other reliable party and no safety plan able to establish.  Per MD request, CM resubmitted referrals again to [facility], [facility] and [facility].  [facility] again has no beds and neither does [facility].  [facility] considering but family requesting closer to home.  Awaiting follow up from [facility] and then will notify family of acceptances.  Further disposition pending.     1924 [facility] has no beds and although [facility] has no charity beds, the patient's Father had thought he could do self pay. However, [facility] requires $4000.00 down and $980 a day; the upfront costs are not an option for the patient's Father who works as a Lawyer.  He has a PhD in Building surveyor but he is retired.  MSW noted that we would explore the options for beds.  Family remains focused on beds in [place] despite CM efforts to educate them regarding the limitations therein.  SBAR to Dr. Conny, Dr. Andrey and CM Dagoberto for further coordination of care needs.     06/18/19  1128: [facility] still has no beds; [facility] will review again now and update cm with bed availability. cm rec'd call from [facility]- McKenzie who stated that pt's father called her upset bc someone called him and told him he was being discharged from ED due to no beds being availabile. cm updated MH that per [facility] placement was still needed and for cm to continue the pursut. Pt is being monitored and eval for possible bed at [facility] today as well.     1500: [facility] doesn't have an appropriate  bed for pt.      Judy,  Deanie Corinne - 06/18/2019 15:00 EST

## 2019-06-18 NOTE — ED Notes (Signed)
ED Note-Nursing       ED RN Reassessment Entered On:  06/18/2019 21:19 EST    Performed On:  06/18/2019 20:01 EST by [facility], RN, Carlyon Shadow               ED RN Reassessment   ED Patient condition :   Alert   ED Behavioral Activity Rating Scale :   6 - Extremely or continuously active, not requiring restraint   ED RN Progress Note :   Patient remains alert, pacing room, able to redirect and provide verbal reassurance to patient. Cooperative, ambulates to bed, lies down, provided blanket.     ED RN Misc Notes :   Will continue to monitor - safety maintained, security at bedside.    MALECKI, RN, ASHLEY C - 06/18/2019 21:16 EST

## 2019-06-18 NOTE — ED Provider Notes (Signed)
Addendum *ED        Patient:   Dale Maldonado, Dale Maldonado             MRN: [id number]            FIN: [id number]               Age:   24 years     Sex:  Male     DOB:  09-01-1995   Associated Diagnoses:   Psychoses   Author:   Meryle Ready CLIVE      Basic Information   Addendum: Assumed care from: Glendell Docker, Pertinent history: Psychiatric placement pending.      Medical Decision Making   Documents reviewed:  Emergency department nurses' notes, emergency department records.    Results review:  Lab results : Lab View   06/17/2019 12:32 EST SARS Cov2 Ag FIA Negative   06/15/2019 23:45 EST Estimated Creatinine Clearance 159.79 mL/min   06/15/2019 23:08 EST WBC 4.3 x10e3/mcL    RBC 5.30 x10e6/mcL    Hgb 16.0 g/dL    HCT 46.2 %    MCV 87.2 fL    MCH 30.2 pg    MCHC 34.6 g/dL    RDW 12.4 %    Platelet 249 x10e3/mcL    MPV 11.1 fL    NRBC Absolute Auto 0.000 x10e3/mcL    NRBC Percent Auto 0.0 %    Sodium Lvl 140 mmol/L    Potassium Lvl 3.8 mmol/L    Chloride 102 mmol/L    CO2 26 mmol/L    Glucose Random 86 mg/dL    BUN 9 mg/dL    Creatinine Lvl 0.6 mg/dL  LOW    AGAP 12 mmol/L    Osmolality Calc 277 mOsm/kg    Calcium Lvl 9.3 mg/dL    eGFR AA 164 mL/min/1.41m???    eGFR Non-AA 142 mL/min/1.746m??    Hep A IGM Ab Non-Reactive    HepBCore IGM Ab Non-Reactive    Hep B Surf Ag Non-Reactive    Hep C Ab Non-Reactive    HIV AG AB Nonreactive    Ethanol Lvl 42.8 mg/dL  HI    Amphetamine U Negative    Barbiturate U Negative    Benzodiazepin U Negative    Cannabinoid U Negative    Cocaine U Negative    Opiate U Negative   .      Reexamination/ Reevaluation   Vital signs   Basic Oxygen Information   06/18/2019 18:08 EST Oxygen Therapy Room air    SpO2 100 %   06/18/2019 8:21 EST Oxygen Therapy Room air    SpO2 100 %   06/17/2019 19:24 EST Oxygen Therapy Room air    SpO2 99 %   06/17/2019 8:00 EST Oxygen Therapy Room air    SpO2 99 %   06/17/2019 2:08 EST Oxygen Therapy Room air    SpO2 100 %      per nurse's notes   Interventions: Order  Profile (Selected)   Inpatient Orders  Ordered  Ativan: 1 mg, 1 tabs, Oral, Once  Completed  Ativan: 1 mg, 0.5 mL, IM, Once  Benadryl: 25 mg, 0.5 mL, IM, Once  Haldol: 5 mg, 1 mL, IM, Once  Haldol: 5 mg, 1 mL, IM, Once.   Notes: MEDICAL DECISION MAKING.   DISCUSSION & PLAN     2020 extreme agitation.  Physically violent.  Required multiple security officers for physical restraint until he was able to be given IM Haldol,  Ativan, Benadryl and placed in four-point soft restraints.  Psychiatric placement pending,   06/16/19 psychiatric consultation note  Assessment/Plan 1. Psychoses He is guarded and minimally cooperative. He does not have a safety plan and was so agitated he required numerous staff and medications to help de-escalate patient. Continue to closely monitor. Not safe for discharge at this time. Per family he has episodes as he "runs off". Unclear if substance related though UDS -.   Psychiatric problem Since arrival he is now reporting SI and plan to harm self in ER. Security watch increased. continue haldol 66m/ativan 229mbenadryl 2533ms needed for agitation/psychosis. Pt will be held involuntarily and will seek inpatient treatment for acute stabilization.   case and plan discussed with CM and Dr. ChaFloydene Flocklease call with any questions  .      Impression and Plan   Diagnosis   Psychoses (ICD10-CM F29, Discharge, Medical)   Signature Line     Electronically Signed on 06/19/2019 12:12 AM EST   ________________________________________________   GREMeryle ReadyIVE               Modified by: GREMeryle ReadyIVE on 06/19/2019 12:12 AM EST

## 2019-06-18 NOTE — ED Notes (Signed)
ED Note-Nursing       ED RN Reassessment Entered On:  06/18/2019 21:24 EST    Performed On:  06/18/2019 20:25 EST by [facility], RN, Carlyon Shadow               ED RN Reassessment   ED Patient condition :   Alert   ED Behavioral Activity Rating Scale :   7 - Violent, requires restraint   ED RN Progress Note :   Patient unable to be redirected, refusing to calm down and get back in bed, attempts to flee room, security restrained patient.   Patient is physically swinging and kicking staff, spitting in the face of staff.  4-point restraints placed.    ED RN Misc Notes :   Security remains at bedside,    Lytle Michaels, RN, Carlyon Shadow - 06/18/2019 21:22 EST

## 2019-06-19 NOTE — ED Notes (Signed)
ED Note-Nursing       ED RN Reassessment Entered On:  06/19/2019 22:06 EST    Performed On:  06/19/2019 22:06 EST by Dan Humphreys, RN, [facility]               ED RN Reassessment   ED RN Progress Note :   RESTING WITHOUT DISTRESS OR DEFICITS--CALM, COOPERATIVE--SECURITY AT BEDSIDE.   Dan Humphreys RN, [facility] - 06/19/2019 22:06 EST

## 2019-06-19 NOTE — ED Provider Notes (Signed)
Addendum *ED        Patient:   Dale Maldonado, Dale Maldonado             MRN: [id number]            FIN: [id number]               Age:   24 years     Sex:  Male     DOB:  09-Mar-1996   Associated Diagnoses:   Psychoses   Author:   Soyla Dryer      Basic Information   Addendum: Time of addendum:: 06/19/2019 15:50:00 , Assumed care from: Sander Nephew, Time  06/19/2019 15:50:00.      Medical Decision Making   Radiology results:  Rad Results (ST)   No qualifying data available..      Reexamination/ Reevaluation   Vital signs   Basic Oxygen Information   06/19/2019 13:22 EST Oxygen Therapy Room air    SpO2 100 %   06/19/2019 6:20 EST Oxygen Therapy Room air    SpO2 99 %   06/19/2019 2:15 EST Oxygen Therapy Room air    SpO2 100 %   06/19/2019 0:35 EST Oxygen Therapy Room air    SpO2 100 %   06/18/2019 18:08 EST Oxygen Therapy Room air    SpO2 100 %   06/18/2019 8:21 EST Oxygen Therapy Room air    SpO2 100 %      This patient with a previous diagnosis of undifferentiated schizophrenia presented to the emergency room extremely agitated 3 to 4 days ago and was also found to be agitated last night.  This is day #4 for this patient in the emergency room and we are unable to place this patient at any facility at this point both for insurance reasons and otherwise bed unavailability.  This patient otherwise will be reassessed tomorrow morning for hopefully a placement in the morning.      Impression and Plan   Diagnosis   Psychoses (ICD10-CM F29, Discharge, Medical)   Plan   Disposition: Patient care transitioned to: Time: 06/19/2019 15:52:00, PARASCHOS-MD,  THEODORE.    Signature Line     Electronically Signed on 06/19/2019 03:52 PM EST   ________________________________________________   Soyla Dryer

## 2019-06-19 NOTE — ED Notes (Signed)
ED Note-Nursing       ED RN Reassessment Entered On:  06/19/2019 5:53 EST    Performed On:  06/19/2019 5:53 EST by Donnal Debar, RN, SCOTT A               ED RN Reassessment   ED RN Progress Note :   Pt asleep with even, unlabored respirations.  EOC safety verified with security at bedside.   BAYS, RN, SC[facility] A - 06/19/2019 5:53 EST

## 2019-06-19 NOTE — ED Notes (Signed)
ED Note-Nursing       ED RN Reassessment Entered On:  06/19/2019 20:55 EST    Performed On:  06/19/2019 20:54 EST by Dan Humphreys, RN, [facility]               ED RN Reassessment   ED RN Progress Note :   PT. AMBULATED TO BATHROOM WITH SECURITY --CALM, COOPERATIVE   Dan Humphreys RN, [facility] - 06/19/2019 20:54 EST

## 2019-06-19 NOTE — ED Notes (Signed)
ED Note-Nursing       ED RN Reassessment Entered On:  06/19/2019 22:51 EST    Performed On:  06/19/2019 22:51 EST by Dan Humphreys, RN, [facility]               ED RN Reassessment   ED RN Progress Note :   RESTING WITHOUT DISTRESS--SECURITY AT Sharlynn Oliphant, RN, [facility] - 06/19/2019 22:51 EST

## 2019-06-19 NOTE — ED Notes (Signed)
ED Note-Nursing       ED RN Reassessment Entered On:  06/19/2019 19:40 EST    Performed On:  06/19/2019 19:39 EST by Dan Humphreys, RN, [facility]               ED RN Reassessment   ED RN Progress Note :   RESTING WITHOUT DISTRESS--APPEARS TO BE SLEEPING--SECURITY AT [facility] IS SAFE   Dan Humphreys RN, [facility] - 06/19/2019 19:39 EST

## 2019-06-19 NOTE — ED Provider Notes (Signed)
Addendum *ED        Patient:   Dale Maldonado, Dale Maldonado             MRN: [id number]            FIN: [id number]               Age:   24 years     Sex:  Male     DOB:  1996/04/25   Associated Diagnoses:   Psychoses   Author:   Lucia Estelle E      Basic Information   Addendum: Time of addendum:: 06/19/2019 05:44:00 , Assumed care from: Leana Gamer, Time  06/19/2019 00:00:00, Pertinent history: Patient awaiting consult, Patient known to me from initial evaluation. Still awaiting plan/placement from psychiatry. He became aggressive and required sedation again. Will continue safe observation in the ER. Marland Kitchen      Reexamination/ Reevaluation   Vital signs   results included from flowsheet : Vital Signs   06/19/2019 2:15 EST Systolic Blood Pressure 109 mmHg    Diastolic Blood Pressure 71 mmHg    Heart Rate Monitored 72 bpm    Respiratory Rate 16 br/min    SpO2 100 %      Basic Oxygen Information   06/19/2019 2:15 EST Oxygen Therapy Room air    SpO2 100 %   06/19/2019 0:35 EST Oxygen Therapy Room air    SpO2 100 %   06/18/2019 18:08 EST Oxygen Therapy Room air    SpO2 100 %   06/18/2019 8:21 EST Oxygen Therapy Room air    SpO2 100 %      Course: progressing as expected.      Impression and Plan   Diagnosis   Psychoses (ICD10-CM F29, Discharge, Medical)   Plan   Condition: Stable.    Disposition: Patient care transitioned to: Time: 06/19/2019 06:00:00, CHAG-MD,  Gerre Scull    Signature Line     Electronically Signed on 06/19/2019 05:46 AM EST   ________________________________________________   Lucia Estelle E

## 2019-06-20 NOTE — ED Notes (Signed)
ED Note-Nursing       ED RN Reassessment Entered On:  06/20/2019 6:09 EST    Performed On:  06/20/2019 6:04 EST by Dan Humphreys, RN, [facility]               ED RN Reassessment   ED RN Progress Note :   ERROR--WRITEN ON WRONG PT.       Dan Humphreys, RN, [facility] - 06/20/2019 6:10 EST

## 2019-06-20 NOTE — ED Notes (Signed)
ED Note-Nursing       ED RN Reassessment Entered On:  06/20/2019 4:14 EST    Performed On:  06/20/2019 4:14 EST by Dan Humphreys, RN, [facility]               ED RN Reassessment   ED RN Progress Note :   NO CHANGES--SECURITY AT Sharlynn Oliphant, RN, [facility] - 06/20/2019 4:14 EST
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
## 2019-06-20 NOTE — ED Provider Notes (Signed)
Addendum *ED        Patient:   DENO, SIDA             MRN: [id number]            FIN: [id number]               Age:   24 years     Sex:  Male     DOB:  20-Jan-1996   Associated Diagnoses:   Psychoses   Author:   Lucia Estelle E      Basic Information   Addendum: Time of addendum:: 06/20/2019 05:29:00 , Assumed care from: PARASCHOS-MD,  THEODORE, Time  06/20/2019 00:00:00, Pertinent history: Patient known to me from previous night shift and my initial evaluation when he presented in the ER. He is still awaiting placement. .      Reexamination/ Reevaluation   Time: 06/20/2019 05:30:00 .   Vital signs   Basic Oxygen Information   06/19/2019 22:51 EST Oxygen Therapy Room air    SpO2 98 %   06/19/2019 13:22 EST Oxygen Therapy Room air    SpO2 100 %   06/19/2019 6:20 EST Oxygen Therapy Room air    SpO2 99 %   06/19/2019 2:15 EST Oxygen Therapy Room air    SpO2 100 %   06/19/2019 0:35 EST Oxygen Therapy Room air    SpO2 100 %      Course: progressing as expected.      Impression and Plan   Diagnosis   Psychoses (ICD10-CM F29, Discharge, Medical)   Plan   Disposition: Patient care transitioned to: Time: 06/20/2019 06:00:00, CHAG-MD,  Gerre Scull    Signature Line     Electronically Signed on 06/20/2019 07:12 AM EST   ________________________________________________   Lucia Estelle E

## 2019-06-20 NOTE — ED Notes (Signed)
ED Note-Nursing       ED RN Reassessment Entered On:  06/20/2019 11:56 EST    Performed On:  06/20/2019 11:55 EST by Jed Limerick, RN, [facility]               ED RN Reassessment   ED Patient condition :   Alert   ED RN Progress Note :   Pt belongings returned to patient including clothes, drivers license, and shoes. Pt discharged home with parents as primary care givers and transportation home. Discharge instructions reviewed with patient & patient parents. Verbal teach back provided.    Jed Limerick, RN, Victor[facility] Dike A - 06/20/2019 11:55 EST

## 2019-06-20 NOTE — ED Notes (Signed)
ED Note-Nursing       ED RN Reassessment Entered On:  06/20/2019 5:22 EST    Performed On:  06/20/2019 5:21 EST by Dan Humphreys, RN, [facility]               ED RN Reassessment   ED RN Progress Note :   [facility] WITHOUT DISTRESS--SECURITY AT Sharlynn Oliphant, RN, [facility] - 06/20/2019 5:21 EST

## 2019-06-20 NOTE — ED Notes (Signed)
ED Note-Nursing       ED RN Reassessment Entered On:  06/20/2019 1:37 EST    Performed On:  06/20/2019 1:36 EST by Dan Humphreys, RN, [facility]               ED RN Reassessment   ED RN Progress Note :   RESTING WITHOUT DISTRESS--APPEARS TO BE SLEEPING--GOOD RISE AND FALL OF CHEST--SECURITY AT [facility] IS SAFE   Dan Humphreys, RN, [facility] - 06/20/2019 1:36 EST

## 2019-06-20 NOTE — ED Provider Notes (Signed)
Addendum *ED        Patient:   Dale Maldonado, Dale Maldonado             MRN: [id number]            FIN: [id number]               Age:   24 years     Sex:  Male     DOB:  [birth year]   Associated Diagnoses:   Psychoses   Author:   Lucia Estelle E      Basic Information   Addendum: Time of addendum:: 06/20/2019 05:35:00 , Assumed care from: PARASCHOS-MD,  THEODORE, Time  06/20/2019 00:00:00, Pertinent history: Patient known to me from previous shift and my initial evaluation. He is still awaiting disposition plan per psychiatry.      Reexamination/ Reevaluation   Time: 06/20/2019 05:20:00 .   Vital signs   Basic Oxygen Information   06/19/2019 22:51 EST Oxygen Therapy Room air    SpO2 98 %   06/19/2019 13:22 EST Oxygen Therapy Room air    SpO2 100 %   06/19/2019 6:20 EST Oxygen Therapy Room air    SpO2 99 %   06/19/2019 2:15 EST Oxygen Therapy Room air    SpO2 100 %   06/19/2019 0:35 EST Oxygen Therapy Room air    SpO2 100 %      Course: progressing as expected.   Notes: No events over night.      Impression and Plan   Diagnosis   Psychoses (ICD10-CM F29, Discharge, Medical)   Plan   Disposition: Patient care transitioned to: Time: 06/20/2019 06:00:00, CHAG-MD,  Gerre Scull    Signature Line     Electronically Signed on 06/20/2019 05:38 AM EST   ________________________________________________   Lucia Estelle E

## 2019-06-20 NOTE — Case Communication (Signed)
 CM Discharge Planning Assessment - Text       CM Progress Note Entered On:  06/20/2019 10:17 EST    Performed On:  06/20/2019 10:06 EST by Dagoberto Fore Corinne               CM Progress Note   CM Home/Lay Caregiver Name/Relationship :   Osman Cahoon-[phone]  Zenineh Yjddjw-Fnuyzm-[phone]   CM Progress Note :   06/16/2019 KJM/MSW consultation requested by MD for disposition of care needs.  Patient reportedly presenting from home with increased aggression and altered mental status.  Patient assessed by Dr. Heyward and placement indicated.  MSW initiated bed search and spoke with the patient's parents.  Mother and Father reporting confusion about the patient's behavior, talking out of his head, running away from the house, increased aggression and irritability.  MSW inquired if there had been a history of similar presentation which initially was denied but Father then did note a diagosis of Schizophrenia in the past and a hospitalization.  MSW informed them of MD plan of care.  Family asking about long term length of stay and MSW indicated stabilization is generally short term.     MSW unable to secure a bed for the patient.  [facility] had no beds and 12 holding in their ED.  [facility] was not able to review the chart because of their staffing issues.  No response from other facilities.  Patient is unfunded which is a barrier for placement as many facilities will not accept charity placement.  Will continue to work on placement.  Patient notified and parents updated that patient will remain in the ED overnight.  Further disposition pending.    06/17/2019 KJM/MSW follow up  consultation for placement of patient.  MSW resent referrals.  MSW spoke with [facility] and [facility], neither of them have any beds.  Options are limited due to lack of funding but will continue efforts.    Extensive phone calls this morning with the patient's father and then the patient's Mother.  MSW inquired if there had a concern about  drugs in the past and Father thought there may have been but he wasn't certain.  MSW inquired about symptoms:  He noted that the patient has no feelings one way or the other-noting lack of response when a friend died; he described the patient's moods as erratic and variable; he noted the impulsivity, poor judgement and elopement; he was not aware of any active hallucinations. He reported that over the past few weeks the patient's personal hygiene has declined and he will wear the same dirty clothes all week.  He reportedly will not bathe or brush his teeth.  Father denied that they were fearful for their safety in the home and reported that the patient has not been violent with them.  He continues to support placement and medications; MSW addressed limitations and with regard to medication noted, addressed how ownership of medication compliance will be with the patient which has posed problems in the past for compliance.  Patient's Mother continues to plead to see her son and MSW updates as often as able sometimes 3 times in the day.  MSW reviewed efforts to maintain patient's calm environment and paperwork.  He again notes that there was no indication of any of these behaviors when the patient was younger and references back to early adulthood with the onset.  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  MSW provided education and support regarding this diagnosis as the patient has refused medications in the past.    The patient remains evasive about his situation and presenting issues.  He was able to deescalated and has a moderate level of agitation/irritability.    1309 KJM/MSW follow up with [facility]; they have no beds and have one unit closed.  [facility]; they do not have any beds; [facility]: no beds.  [facility]-requested them to assess patient.  [facility]-no response.  Limitations are funding and lack of available beds.    1840 Bed situation reassessed  in the afternoon and again in the evening.  Patient restless about on going delays and assessed again by Dr. Andrey, who also conferred with his Father who came to the hospital.  Patient noted to be responding to internal stimuli during MD assessment this evening, which had not been noted earlier.  He continues to deny that he has any mental health issues and that he just was trying to leave this parent's home because they are so controlling.  However, the patient can not produce any other reliable party and no safety plan able to establish.  Per MD request, CM resubmitted referrals again to [facility], [facility] and [facility].  [facility] again has no beds and neither does [facility].  [facility] considering but family requesting closer to home.  Awaiting follow up from [facility] and then will notify family of acceptances.  Further disposition pending.     1924 [facility] has no beds and although [facility] has no charity beds, the patient's Father had thought he could do self pay. However, [facility] requires $4000.00 down and $980 a day; the upfront costs are not an option for the patient's Father who works as a Lawyer.  He has a PhD in Building surveyor but he is retired.  MSW noted that we would explore the options for beds.  Family remains focused on beds in [place] despite CM efforts to educate them regarding the limitations therein.  SBAR to Dr. Conny, Dr. Andrey and CM Dagoberto for further coordination of care needs.     06/18/19  1128: [facility] still has no beds; [facility] will review again now and update cm with bed availability. cm rec'd call from [facility] [place] who stated that pt's father called her upset bc someone called him and told him he was being discharged from ED due to no beds being availabile. cm updated MH that per Jesus placement was still needed and for cm to continue the pursut. Pt is being monitored and eval for possible bed at [facility] today as well.     1500: [facility] doesn't have an appropriate  bed for pt.     06/19/19 (Late Entry) referral sent again and mulitple calls made to facilities- no beds for pt. pt in restraints last night so no acceptance--DJL    06/20/19: Pt remains in the ED overnight. No restraints last night and pt remains calm and cooperative and restless at times.     Referrals sent again this morning:   ---[facility]- NO BED  -- [facility]- NO Charity BED, asked cm to stop sending referral  ---[facility]- NO CHARITY BED- pt can self-pay (fam can't) $4000 down; $900+/day  ---[facility]- no answer on phone, referral faxed again  ---[facility]- NO bed today, have planned dc's on Monday but pt has been declined earlier in week d/t violence concerns  ---[facility]: NO bed at this time- will call cm back today (Sunday) if any thing opens up-- cm  will check in later this afternoon  --[facility]- no answer on phone, referral faxed- no return call  --[facility]- NO UNFUNDED  --[facility]- NO BEDS today    PLAN: call [facility], [facility], & [facility] back this afternoon for bed update; if no bed secured today- cm to f/u with [facility] about beds opening up Monday. CM to staff again with psych about [facility] bed.          Judy,  Deanie Corinne - 06/20/2019 10:06 EST

## 2019-06-20 NOTE — ED Notes (Signed)
ED Patient Education Note     Patient Education Materials Follows:  Behavioral Health     Psychosis    Psychosis refers to a severe loss of contact with reality. During a psychotic episode, a person is not able to think clearly, and his or her emotions and responses do not match up with what is actually happening. Someone may have false beliefs about what is happening or who they are (delusions). Someone may see, hear, taste, smell, or feel things that are not present (hallucinations).     Psychosis usually occurs with very serious mental health (psychiatric) conditions such as schizophrenia, bipolar disorder, or major depression. It can sometimes also be the result of drug use or certain medical conditions.    SYMPTOMS    Symptoms of a psychotic episode include:     Delusions, such as:    ? Feeling excessive fear or suspicion (paranoia).    ? Believing something that is odd, unrealistic, or false, such as having a false belief about being someone else.     Hallucinations.     Disorganized thinking, such as thoughts that jump from one to another that do not make sense to others.     Disorganized speech, such as saying things that do not make sense to others.     Inappropriate behavior, such as talking to oneself or intruding on unfamiliar people.    DIAGNOSIS    A diagnosis of psychosis is made through an assessment by a health care provider, who will ask questions about thoughts, feelings, behavior, drug use, and medical conditions. The health care provider may also do one or more of the following:     Physical exam.     Blood tests.     Brain imaging, such as a CT scan or MRI.     Brain wave study (EEG).    The health care provider may make a referral for further evaluation by a mental health professional.    TREATMENT    Treatment depends on the cause of the psychosis. Treatment may include one or more of the following:     Monitoring and supportive care in the emergency room or hospital. ?     Taking medicines  (antipsychotic medicine) to reduce symptoms and to balance chemicals in the brain.     Treating an underlying medical condition.     Stopping or reducing drugs that are causing psychosis.     Therapy and other supportive programs outside of the hospital.    HOME CARE INSTRUCTIONS     Over-the-counter and prescription medicines should be taken only as told by the health care provider.     The health care provider should be consulted before over-the-counter medicines, herbs, or supplements are used.     All follow-up visits should be kept as told by the health care provider. This is important.     A healthy lifestyle should be maintained. This includes:    ? Eating a healthy diet.    ? Getting enough sleep.    ? Exercising regularly.    ? Avoiding alcohol and recreational drugs as told by the health care provider.    SEEK MEDICAL CARE IF:     Medicines do not seem to be helping.     The person hears voices telling him or her to do things.     The person continues to see, smell, or feel things that are not there.     The person feels extremely fearful  and suspicious that someone or something will harm him or her.     The person feels unable to leave his or her house.     The person has trouble taking care of himself or herself.     The person experiences side effects of medicines, such as:    ? Changes in sleep patterns.    ? Dizziness.    ? Weight gain.    ? Restlessness.    ? Movement changes.    ? Muscle spasms.    ? Tremors.    SEEK IMMEDIATE MEDICAL CARE IF:     Serious thoughts occur about self-harm or about hurting others.     There are serious side effects of medicine, such as:    ? Swelling of the face, lips, tongue, or throat.    ? Fever, confusion, muscle spasms, or seizures.    This information is not intended to replace advice given to you by your health care provider. Make sure you discuss any questions you have with your health care provider.    Document Released: 10/03/2009 Document Revised: 08/30/2014  Document Reviewed: 04/19/2014  Elsevier Interactive Patient Education ?2016 [employer]
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
## 2019-06-20 NOTE — ED Notes (Signed)
ED Note-Nursing       ED RN Reassessment Entered On:  06/20/2019 8:48 EST    Performed On:  06/20/2019 8:47 EST by Lyndon Code, RN, Rolin Barry               ED RN Reassessment   ED Patient condition :   Awaiting Disposition, Alert   Numeric Rating Pain Scale :   0 = No pain   Pasero Opioid Induced Sedation Scale :   1 = Awake and alert   ED Behavioral Activity Rating Scale :   4 - Quiet and awake (normal level of activity)   ED RN Progress Note :   Breakfast tray given.  Pt. asks when he will be transferred.  Pt. advised the case mgmt. will be consulted.  Security remains at bedside.   Lyndon Code, RN, LAURA L - 06/20/2019 8:47 EST

## 2019-06-20 NOTE — ED Notes (Signed)
ED Note-Nursing       ED RN Reassessment Entered On:  06/20/2019 2:55 EST    Performed On:  06/20/2019 2:55 EST by Dan Humphreys, RN, [facility]               ED RN Reassessment   ED RN Progress Note :   RESTING WITHOUT DISTRESS--SLEEPING--SECURITY AT [facility].   Dan Humphreys RN, [facility] - 06/20/2019 2:55 EST

## 2019-06-20 NOTE — ED Notes (Signed)
 ED Patient Summary       ;       [facility] Emergency Department  [address] [place] [address]  [phone]  Discharge Instructions (Patient)  _______________________________________     Name: Dale Maldonado, Dale Maldonado  DOB: 1995/11/04                   MRN: [id number]                   FIN: [id number]  Reason For Visit: Psychiatric problem; [facility]  Final Diagnosis: 1:Psychoses     Visit Date: 06/15/2019 22:19:00  Address: [address] [place] [address]  Phone: [phone]     Emergency Department Providers:         Primary Physician:     RODGERS ILA LULLA [facility] would like to thank you for allowing us  to assist you with your healthcare needs. The following includes patient education materials and information regarding your injury/illness.     Follow-up Instructions:  You were seen today on an emergency basis. Please contact your primary care doctor for a follow up appointment. If you received a referral to a specialist doctor, it is important you follow-up as instructed.    It is important that you call your follow-up doctor to schedule and confirm the location of your next appointment. Your doctor may practice at multiple locations. The office location of your follow-up appointment may be different to the one written on your discharge instructions.    If you do not have a primary care doctor, please call [phone] for help in finding a [facility] Provider. For help in finding a specialist doctor, please call [phone].    The [facility] "Ask a Nurse" line in staffed by Registered Nurses and is a free service to the community. We are available Monday - Friday from 8am to 5pm to answer your questions about your health. Please call [phone].    If your condition gets worse before your follow-up with your primary care doctor or specialist, please return to the Emergency Department.        Coronavirus 2019 (COVID-19)  Reminders:     Patients 65 and older can contact their [facility] doctors' offices to schedule appointments to receive the COVID-19 vaccine at the [facility] or send us  an email at [email or web address].            Scan this code with your phone camera to send an email to the address above.      Patients who are 67 and older who do not have a [facility] physician can call [phone] to schedule vaccination appointments.         Follow Up Appointments:  Primary Care Provider:      Name: PCP,  NONE      Phone:                  With: Address: When:   [facility] [address] [place], [place] [address]  [phone] Business (1) Within 5 to 7 days       With: Address: When:   Follow-up in Emergency Department  , only if needed   Comments:   Return to ED if symptoms worsen     You were evaluated by psychiatrist on call including Dr. Jesus who feels  comfortable you being discharged as long as you are with the family member at home     You are strongly encouraged to take medications as prescribed as per Dr. Jesus     Follow-up to the emergency room if you have any thoughts of harming yourself or others you become agitated     Please make an appointment with [facility] mental health on Monday for a follow-up within the next 5 to 7 days              Printed Prescriptions:    Patient Education Materials:  Discharge Orders          Discharge Patient 06/20/19 11:36:00 EST         Comment:      Psychosis     Psychosis    Psychosis refers to a severe loss of contact with reality. During a psychotic episode, a person is not able to think clearly, and his or her emotions and responses do not match up with what is actually happening. Someone may have false beliefs about what is happening or who they are (delusions). Someone may see, hear, taste, smell, or feel things that are not present (hallucinations).     Psychosis usually occurs with very serious mental  health (psychiatric) conditions such as schizophrenia, bipolar disorder, or major depression. It can sometimes also be the result of drug use or certain medical conditions.    SYMPTOMS    Symptoms of a psychotic episode include:     Delusions, such as:    ? Feeling excessive fear or suspicion (paranoia).    ? Believing something that is odd, unrealistic, or false, such as having a false belief about being someone else.     Hallucinations.     Disorganized thinking, such as thoughts that jump from one to another that do not make sense to others.     Disorganized speech, such as saying things that do not make sense to others.     Inappropriate behavior, such as talking to oneself or intruding on unfamiliar people.    DIAGNOSIS    A diagnosis of psychosis is made through an assessment by a health care provider, who will ask questions about thoughts, feelings, behavior, drug use, and medical conditions. The health care provider may also do one or more of the following:     Physical exam.     Blood tests.     Brain imaging, such as a CT scan or MRI.     Brain wave study (EEG).    The health care provider may make a referral for further evaluation by a mental health professional.    TREATMENT    Treatment depends on the cause of the psychosis. Treatment may include one or more of the following:     Monitoring and supportive care in the emergency room or hospital. ?     Taking medicines (antipsychotic medicine) to reduce symptoms and to balance chemicals in the brain.     Treating an underlying medical condition.     Stopping or reducing drugs that are causing psychosis.     Therapy and other supportive programs outside of the hospital.    HOME CARE INSTRUCTIONS     Over-the-counter and prescription medicines should be taken only as told by the health care provider.     The health care provider should be consulted before over-the-counter medicines, herbs, or supplements are used.     All follow-up visits should be kept as  told by the health care provider. This is important.     A healthy lifestyle should be maintained. This includes:    ? Eating a healthy diet.    ? Getting enough sleep.    ? Exercising regularly.    ? Avoiding alcohol and recreational drugs as told by the health care provider.    SEEK MEDICAL CARE IF:     Medicines do not seem to be helping.     The person hears voices telling him or her to do things.     The person continues to see, smell, or feel things that are not there.     The person feels extremely fearful and suspicious that someone or something will harm him or her.     The person feels unable to leave his or her house.     The person has trouble taking care of himself or herself.     The person experiences side effects of medicines, such as:    ? Changes in sleep patterns.    ? Dizziness.    ? Weight gain.    ? Restlessness.    ? Movement changes.    ? Muscle spasms.    ? Tremors.    SEEK IMMEDIATE MEDICAL CARE IF:     Serious thoughts occur about self-harm or about hurting others.     There are serious side effects of medicine, such as:    ? Swelling of the face, lips, tongue, or throat.    ? Fever, confusion, muscle spasms, or seizures.    This information is not intended to replace advice given to you by your health care provider. Make sure you discuss any questions you have with your health care provider.    Document Released: 10/03/2009 Document Revised: 08/30/2014 Document Reviewed: 04/19/2014  Elsevier Interactive Patient Education ?2016 Elsevier Inc.         Allergy Info: No Known Allergies     Medication Information:  [facility] provided you with a complete list of medications post discharge, if you have been instructed to stop taking a medication please ensure you also follow up with this information to your Primary Care Physician.  Unless otherwise noted, patient will continue to take medications as prescribed prior to the Emergency Room visit.  Any specific questions  regarding your chronic medications and dosages should be discussed with your physician(s) and pharmacist.          risperiDONE (RisperDAL 2 mg oral tablet) 1 Tabs Oral (given by mouth) 2 times a day for 14 Days. Refills: 0.      Medications Administered During Visit:              Medication Dose Route   lorazepam 1 mg IM   haloperidol 5 mg IM   diphenhydrAMINE 25 mg IM   haloperidol 5 mg IM   risperidone 2 mg Oral          Major Tests and Procedures:  The following procedures and tests were performed during your ED visit.  COMMON PROCEDURES%>  COMMON PROCEDURES COMMENTS%>          Laboratory Orders  Name Status Details   BMP Completed Blood, Stat, ST - Stat, 06/15/19 22:28:00 EST, 06/15/19 22:28:00 EST, Nurse collect, MILLER-DO,  MEGAN E, Print label Y/N   CBCDIFF Completed Blood, Stat, ST - Stat, 06/15/19 22:28:00 EST, 06/15/19 22:28:00 EST, Nurse collect, MILLER-DO,  MEGAN E, Print label Y/N   COV FIA Completed Nasal  Swab, Stat, ST - Stat, 06/17/19 10:29:00 EST, 06/17/19 10:29:00 EST, Nurse collect, MANDEL-DO,  ADAM HOWARD, Print label Y/N   Ethanol Completed Blood, Stat, ST - Stat, 06/15/19 22:28:00 EST, 06/15/19 22:28:00 EST, Nurse collect, MILLER-DO,  MEGAN E, Print label Y/N   HAV IgM Completed Blood, Stat, ST - Stat, 06/15/19 22:39:00 EST, 06/15/19 22:39:00 EST, Nurse collect, MILLER-DO,  MEGAN E, Print label Y/N   HBCore IgM Completed Blood, Stat, ST - Stat, 06/15/19 22:39:00 EST, 06/15/19 22:39:00 EST, Nurse collect, MILLER-DO,  MEGAN E, Print label Y/N   HBSAG Completed Blood, Stat, ST - Stat, 06/15/19 22:39:00 EST, 06/15/19 22:39:00 EST, Nurse collect, MILLER-DO,  MEGAN E, Print label Y/N   HCV Completed Blood, Stat, ST - Stat, 06/15/19 22:39:00 EST, 06/15/19 22:39:00 EST, Nurse collect, MILLER-DO,  MEGAN E, Print label Y/N   HIV AB/AG Completed Blood, Stat, ST - Stat, 06/15/19 22:38:00 EST, 06/15/19 22:39:00 EST, Nurse collect, MILLER-DO,  MEGAN E, Print label Y/N   U Drug Completed Urine, Stat, ST -  Stat, 06/15/19 22:28:00 EST, 06/15/19 22:28:00 EST, Nurse collect               Radiology Orders  No radiology orders were placed.              Patient Care Orders  Name Status Details   Behavioral Health Precautions Completed 06/15/19 22:28:00 EST, Once, 06/15/19 22:28:00 EST   COVID-19 Status Ordered 06/17/19 10:29:56 EST, NOT VALID FOR pharmacy, laboratory, radiology., 06/17/19 10:29:56 EST, COVID-19 Not Detected   Communication to Nursing Completed 06/15/19 22:28:00 EST, Document Valuables and Belongings on [facility] form, 06/15/19 22:28:00 EST, 06/15/19 22:28:00 EST   DC ISO Order/Icons Ordered 06/17/19 12:58:34 EST, 06/17/19 12:58:34 EST   Discharge Patient Ordered 06/20/19 11:36:00 EST   ED Assessment Adult Completed 06/15/19 22:24:33 EST, 06/15/19 22:24:33 EST   ED Secondary Triage Completed 06/15/19 22:24:33 EST, 06/15/19 22:24:33 EST   ED Triage Adult Completed 06/15/19 22:19:50 EST, 06/15/19 22:19:50 EST   Immunizations Quality Measures Ordered 06/16/19 23:52:23 EST, 06/16/19 23:52:23 EST   Low Risk HD Precaution Ordered 06/19/19 13:25:02 EST, Constant Order   Notify Provider Completed 06/17/19 10:29:56 EST, This message can only be seen by Nursing, it is not visible to Pharmacy, Laboratory, or Radiology., 06/17/19 10:29:56 EST   Security to Bedside Completed 06/15/19 22:28:00 EST, This message can only be seen by Nursing, it is not visible to Pharmacy, Laboratory, or Radiology., 06/15/19 22:28:00 EST, 06/15/19 22:28:00 EST, Once   VTE Quality Measures Ordered 06/16/19 23:51:59 EST, 06/16/19 23:51:59 EST       ---------------------------------------------------------------------------------------------------------------------  [facility]) encourages you to self-enroll in the [facility] Patient Portal.  [facility] Patient Portal will allow you to manage your personal health information securely from your own electronic device now and in the future.  To begin your Patient  Portal enrollment process, please visit [email or web address] Click on "Sign up now" under [facility].  If you find that you need additional assistance on the [facility] Patient Portal or need a copy of your medical records, please call the [facility] Medical Records Office at [phone].  Comment:

## 2019-06-20 NOTE — Case Communication (Signed)
 CM Discharge Planning Assessment - Text       CM Discharge Plan Entered On:  06/20/2019 11:32 EST    Performed On:  06/20/2019 11:29 EST by Judy,  Deanie Corinne               CM Discharge Plan   Discharge To :   Family support   CM Discharge Service Agency Selected :   Out Patient services with [facility]    CM Home/Lay Caregiver Name/Relationship :   Christofer Shen [phone]   CM Progress Note :   06/16/2019 KJM/MSW consultation requested by MD for disposition of care needs.  Patient reportedly presenting from home with increased aggression and altered mental status.  Patient assessed by Dr. Heyward and placement indicated.  MSW initiated bed search and spoke with the patient's parents.  Mother and Father reporting confusion about the patient's behavior, talking out of his head, running away from the house, increased aggression and irritability.  MSW inquired if there had been a history of similar presentation which initially was denied but Father then did note a diagosis of Schizophrenia in the past and a hospitalization.  MSW informed them of MD plan of care.  Family asking about long term length of stay and MSW indicated stabilization is generally short term.     MSW unable to secure a bed for the patient.  [facility] had no beds and 12 holding in their ED.  [facility] was not able to review the chart because of their staffing issues.  No response from other facilities.  Patient is unfunded which is a barrier for placement as many facilities will not accept charity placement.  Will continue to work on placement.  Patient notified and parents updated that patient will remain in the ED overnight.  Further disposition pending.    06/17/2019 KJM/MSW follow up  consultation for placement of patient.  MSW resent referrals.  MSW spoke with [facility] and [facility], neither of them have any beds.  Options are limited due to lack of funding but will continue  efforts.    Extensive phone calls this morning with the patient's father and then the patient's Mother.  MSW inquired if there had a concern about drugs in the past and Father thought there may have been but he wasn't certain.  MSW inquired about symptoms:  He noted that the patient has no feelings one way or the other-noting lack of response when a friend died; he described the patient's moods as erratic and variable; he noted the impulsivity, poor judgement and elopement; he was not aware of any active hallucinations. He reported that over the past few weeks the patient's personal hygiene has declined and he will wear the same dirty clothes all week.  He reportedly will not bathe or brush his teeth.  Father denied that they were fearful for their safety in the home and reported that the patient has not been violent with them.  He continues to support placement and medications; MSW addressed limitations and with regard to medication noted, addressed how ownership of medication compliance will be with the patient which has posed problems in the past for compliance.  Patient's Mother continues to plead to see her son and MSW updates as often as able sometimes 3 times in the day.  MSW reviewed efforts to maintain patient's calm environment and paperwork.  He again notes that there was no indication of any of these behaviors when the patient was younger and references back  to early adulthood with the onset.  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  MSW provided education and support regarding this diagnosis as the patient has refused medications in the past.    The patient remains evasive about his situation and presenting issues.  He was able to deescalated and has a moderate level of agitation/irritability.    1309 KJM/MSW follow up with [facility]; they have no beds and have one unit closed.  [facility]; they do not have any beds; [facility]: no beds.   [facility]-requested them to assess patient.  [facility]-no response.  Limitations are funding and lack of available beds.    1840 Bed situation reassessed in the afternoon and again in the evening.  Patient restless about on going delays and assessed again by Dr. Andrey, who also conferred with his Father who came to the hospital.  Patient noted to be responding to internal stimuli during MD assessment this evening, which had not been noted earlier.  He continues to deny that he has any mental health issues and that he just was trying to leave this parent's home because they are so controlling.  However, the patient can not produce any other reliable party and no safety plan able to establish.  Per MD request, CM resubmitted referrals again to [facility], [facility] and [facility].  [facility] again has no beds and neither does [facility].  [facility] considering but family requesting closer to home.  Awaiting follow up from [facility] and then will notify family of acceptances.  Further disposition pending.     1924 [facility] has no beds and although [facility] has no charity beds, the patient's Father had thought he could do self pay. However, [facility] requires $4000.00 down and $980 a day; the upfront costs are not an option for the patient's Father who works as a Lawyer.  He has a PhD in Building surveyor but he is retired.  MSW noted that we would explore the options for beds.  Family remains focused on beds in [place] despite CM efforts to educate them regarding the limitations therein.  SBAR to Dr. Conny, Dr. Andrey and CM Dagoberto for further coordination of care needs.     06/18/19  1128: [facility] still has no beds; [facility] will review again now and update cm with bed availability. cm rec'd call from [facility] who stated that pt's father called her upset bc someone called him and told him he was being discharged from ED due to no beds being availabile. cm updated MH that per Jesus placement was still needed  and for cm to continue the pursut. Pt is being monitored and eval for possible bed at [facility] today as well.     1500: [facility] doesn't have an appropriate bed for pt.     06/19/19 (Late Entry) referral sent again and mulitple calls made to facilities- no beds for pt. pt in restraints last night so no acceptance--DJL    06/20/19: Pt remains in the ED overnight. No restraints last night and pt remains calm and cooperative and restless at times.     Referrals sent again this morning:   ---[facility]- NO BED  -- [facility]- NO Charity BED, asked cm to stop sending referral  ---[facility]- NO CHARITY BED- pt can self-pay (fam can't) $4000 down; $900+/day  ---[facility]- no answer on phone, referral faxed again  ---[facility]- NO bed today, have planned dc's on Monday but pt  has been declined earlier in week d/t violence concerns  ---[facility]: NO bed at this time- will call cm back today (Sunday) if any thing opens up-- cm will check in later this afternoon  --[facility]- no answer on phone, referral faxed- no return call  --[facility]- NO UNFUNDED  --[facility]- NO BEDS today    PLAN: call [facility], [facility], & [facility] back this afternoon for bed update; if no bed secured today- cm to f/u with [facility] about beds opening up Monday. CM to staff again with psych about [facility] bed.     11 30: pt seen by Dr Jesus again this morning. pt doing very well after few doses of Risperdal. pt is safe to dc to support of parents w/ continued Risperdal and f/u with [facility] outpatient on Monday. cm talked with mr Huss is agreeable to this plan and willing to take son home. cm rounded on pt and updated on him being dc today to parents and stressed the importance of continuing to take his medication. Rosen called and updated MD Chag. pt will be discharge. cm gave RN flyer for [employer] MH info. [facility]       Discharge Planning Assessment Complete :   Yes   Judy,  Deanie Corinne - 06/20/2019 11:29 EST

## 2019-09-05 ENCOUNTER — Other Ambulatory Visit: Payer: Self-pay

## 2019-09-05 ENCOUNTER — Emergency Department (HOSPITAL_COMMUNITY)
Admission: EM | Admit: 2019-09-05 | Discharge: 2019-09-07 | Disposition: A | Discharge status: Other Acute Inpatient Hospital | Payer: Self-pay | Attending: Emergency Medicine | Admitting: Emergency Medicine

## 2019-09-05 ENCOUNTER — Encounter (HOSPITAL_COMMUNITY): Payer: Self-pay | Admitting: *Deleted

## 2019-09-05 DIAGNOSIS — F23 Brief psychotic disorder: Secondary | ICD-10-CM | POA: Insufficient documentation

## 2019-09-05 DIAGNOSIS — G4700 Insomnia, unspecified: Secondary | ICD-10-CM | POA: Insufficient documentation

## 2019-09-05 DIAGNOSIS — Z20822 Contact with and (suspected) exposure to covid-19: Secondary | ICD-10-CM | POA: Insufficient documentation

## 2019-09-05 DIAGNOSIS — F17210 Nicotine dependence, cigarettes, uncomplicated: Secondary | ICD-10-CM | POA: Insufficient documentation

## 2019-09-05 DIAGNOSIS — R451 Restlessness and agitation: Secondary | ICD-10-CM

## 2019-09-05 LAB — RAPID URINE DRUG SCREEN, HOSP PERFORMED
Amphetamines: NOT DETECTED
Barbiturates: NOT DETECTED
Benzodiazepines: NOT DETECTED
Cocaine: NOT DETECTED
Opiates: NOT DETECTED
Tetrahydrocannabinol: NOT DETECTED

## 2019-09-05 LAB — COMPREHENSIVE METABOLIC PANEL
ALT: 12 U/L (ref 0–44)
AST: 18 U/L (ref 15–41)
Albumin: 4.5 g/dL (ref 3.5–5.0)
Alkaline Phosphatase: 79 U/L (ref 38–126)
Anion gap: 13 (ref 5–15)
BUN: 12 mg/dL (ref 6–20)
CO2: 24 mmol/L (ref 22–32)
Calcium: 9.5 mg/dL (ref 8.9–10.3)
Chloride: 102 mmol/L (ref 98–111)
Creatinine, Ser: 0.79 mg/dL (ref 0.61–1.24)
GFR calc Af Amer: >60 mL/min (ref >60)
GFR calc non Af Amer: >60 mL/min (ref >60)
Glucose, Bld: 94 mg/dL (ref 70–99)
Potassium: 4.1 mmol/L (ref 3.5–5.1)
Sodium: 139 mmol/L (ref 135–145)
Total Bilirubin: 0.7 mg/dL (ref 0.3–1.2)
Total Protein: 7.1 g/dL (ref 6.5–8.1)

## 2019-09-05 LAB — SALICYLATE LEVEL: Salicylate Lvl: <7 mg/dL — ABNORMAL LOW (ref 7.0–30.0)

## 2019-09-05 LAB — CBC
HCT: 49.1 % (ref 39.0–52.0)
Hemoglobin: 16.2 g/dL (ref 13.0–17.0)
MCH: 29.5 pg (ref 26.0–34.0)
MCHC: 33 g/dL (ref 30.0–36.0)
MCV: 89.3 fL (ref 80.0–100.0)
Platelets: 169 10*3/uL (ref 150–400)
RBC: 5.5 MIL/uL (ref 4.22–5.81)
RDW: 11.9 % (ref 11.5–15.5)
WBC: 4.6 10*3/uL (ref 4.0–10.5)
nRBC: 0 % (ref 0.0–0.2)

## 2019-09-05 LAB — ETHANOL: Alcohol, Ethyl (B): <10 mg/dL (ref <10)

## 2019-09-05 LAB — RESPIRATORY PANEL BY RT PCR (FLU A&B, COVID)
Influenza A by PCR: NEGATIVE
Influenza B by PCR: NEGATIVE
SARS Coronavirus 2 by RT PCR: NEGATIVE

## 2019-09-05 LAB — ACETAMINOPHEN LEVEL: Acetaminophen (Tylenol), Serum: <10 ug/mL — ABNORMAL LOW (ref 10–30)

## 2019-09-05 MED ORDER — ALUM & MAG HYDROXIDE-SIMETH 200-200-20 MG/5ML PO SUSP: 30.0000 mL | Freq: Four times a day (QID) | ORAL | Status: DC | PRN

## 2019-09-05 MED ORDER — LORAZEPAM 1 MG PO TABS
1.0000 mg | ORAL_TABLET | Freq: Once | ORAL | Status: DC
  Filled 2019-09-05: qty 1

## 2019-09-05 MED ORDER — ZOLPIDEM TARTRATE 5 MG PO TABS: 5.0000 mg | ORAL_TABLET | Freq: Every evening | ORAL | Status: DC | PRN

## 2019-09-05 MED ORDER — ONDANSETRON HCL 4 MG PO TABS: 4.0000 mg | ORAL_TABLET | Freq: Three times a day (TID) | ORAL | Status: DC | PRN

## 2019-09-05 MED ORDER — IBUPROFEN 400 MG PO TABS: 600.0000 mg | ORAL_TABLET | Freq: Three times a day (TID) | ORAL | Status: DC | PRN

## 2019-09-05 MED ORDER — ZIPRASIDONE MESYLATE 20 MG IM SOLR
20.0000 mg | Freq: Once | INTRAMUSCULAR | Status: AC
Start: 2019-09-05 — End: 2019-09-05
  Administered 2019-09-05: 08:00:00 20 mg via INTRAMUSCULAR
  Filled 2019-09-05: qty 20
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
## 2019-09-05 NOTE — ED Provider Notes (Signed)
  Physical Exam  BP 126/72   Pulse 77   Temp (!) 97.4 F (36.3 C) (Oral)   Resp 18   SpO2 99%   Physical Exam Vitals and nursing note reviewed.  Constitutional:      Appearance: Normal appearance.  HENT:     Head: Normocephalic.  Eyes:     Conjunctiva/sclera: Conjunctivae normal.  Pulmonary:     Effort: Pulmonary effort is normal.  Skin:    General: Skin is dry.  Neurological:     Mental Status: He is alert.  Psychiatric:        Mood and Affect: Mood normal.     ED Course/Procedures   Clinical Course as of Sep 05 1447  Wynelle Link Sep 05, 2019  9323 Patient more relaxed with geodon but still slightly agitated and pacing room and trying to leave. PO ativan offered but reports he wants anesthesia instead. Explained to him we cannot do this. Patient asked for a mattress to sleep on the floor. Advised we do not have that. Patient then put covers over his head and said he would try to sleep.    [KM]    Clinical Course User Index [KM] Arlyn Dunning, PA-C    Procedures  MDM  Care assumed from [facility] PA due to change of shift. See her note for full HPI. Briefly this ia an acutely agitated male who meets inpatient criteria for psych treatment. Currently under IVC, Geodon given. Awaiting psych placement.       Arlyn Dunning, PA-C 09/05/19 1449    Maia Plan, MD 09/09/19 [phone]

## 2019-09-05 NOTE — ED Notes (Signed)
Staffing Office aware pt IVC'd. No Sitter available at this time.
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
## 2019-09-05 NOTE — ED Triage Notes (Addendum)
Pt has been having anger problems and not being able to sleep per pt and parents. Pt said he does not want to hurt anyone or himself. Pt said not hearing any voices or hallucinations. Parents are saying he has psychosis issues and wanting him help. Pt came up and said he is not his real father and wanted him to leave him alone and started yelling.

## 2019-09-05 NOTE — ED Notes (Signed)
RN noted pt talking to mother in an agitated tone. Mother at bedside with head down on bed crying. Uncertain of topic of conversation. Pt agitated at that time.   Mother had left at this time. Taking all pt's belongings. Pt now calm.  PA Mia informed
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
## 2019-09-05 NOTE — ED Notes (Signed)
TTS at bedside. 

## 2019-09-05 NOTE — Progress Notes (Signed)
Nira Conn, NP recommends inpt tx. EDP McDonald, Mia A, PA-C has been advised

## 2019-09-05 NOTE — ED Notes (Addendum)
Pt's father brought D/C paperwork from [facility]  - [facility] [phone] - copy faxed to [facility] - and advised pt was there in 05/2019 and was diagnosed w/psychosis. States pt had agreed prior to be d/c'd that he would take his meds but pt failed to follow through and has not been taking them. States pt has not been sleeping and has been aggressive at home.

## 2019-09-05 NOTE — ED Notes (Signed)
Upon assessment pt states he has been feeling angry. He states he feels like he cannot control his emotions. He only feels sad and angry. Denies SI/HI. Pt cooperative with assessment. PA Mia informed.

## 2019-09-05 NOTE — ED Notes (Signed)
Pt woke - ate snack given - lying back down at this time.

## 2019-09-05 NOTE — Progress Notes (Signed)
Patient meets criteria for inpatient treatment per Nira Conn, NP. No appropriate beds at [facility] currently. CSW faxed referrals to the following facilities for review:  [facility]   [facility]   [facility]   [facility]   [facility]   [facility]   [facility]   [facility]   [facility]   [facility]     TTS will continue to seek bed placement.     Ruthann Cancer MSW, [facility] Social Worker Disposition  [facility] Ph: [phone] Fax: [phone] 09/05/2019 9:10 AM

## 2019-09-05 NOTE — ED Notes (Signed)
Pt coming to doorway frequently asking for something to help him sleep. Pt reports feeling paranoid and restless. Tresa Endo PA made aware

## 2019-09-06 ENCOUNTER — Emergency Department (HOSPITAL_COMMUNITY): Payer: Self-pay

## 2019-09-06 NOTE — ED Notes (Signed)
Pt awake and ambulated to restroom. Polite and pleasant at this time. Asked for something to eat and drink. Gave pt a coke and cheese and crackers. No sandwiches available at this time. Pt watching TV without complaints.

## 2019-09-06 NOTE — ED Notes (Signed)
Ate breakfast then returned to sleeping.

## 2019-09-06 NOTE — ED Notes (Signed)
Pt lying on bed - alert. Aware of CT ordered - Pt voiced understanding and agreement.

## 2019-09-06 NOTE — ED Notes (Signed)
Pt returned from CT °

## 2019-09-06 NOTE — ED Notes (Signed)
Pt being transported to [facility] [employer] w/pt.

## 2019-09-06 NOTE — ED Notes (Signed)
Pt talked on phone at nurses' desk w/father. Father aware of and agreement w/tx plan - BH seeking Inpt Placement.

## 2019-09-06 NOTE — Progress Notes (Signed)
Patient ID: Brendan Hogan, male   DOB: 06-11-1995, 24 y.o.   MRN: [id number]   Dr. Lucianne Muss has recommended CT of the head due to new onset of psychosis/paranoid delusions. Order has been placed.

## 2019-09-06 NOTE — ED Provider Notes (Signed)
CT scan of this patient was obtained showing:   IMPRESSION:  1. No acute intracranial pathology.  2. 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      Placido Sou, PA-C 09/06/19 1541    Pricilla Loveless, MD 09/07/19 [phone]

## 2019-09-06 NOTE — ED Notes (Signed)
Pt in shower.  

## 2019-09-06 NOTE — ED Notes (Signed)
CT-Head- IVC- Inpt Dinner ordered

## 2019-09-07 ENCOUNTER — Inpatient Hospital Stay (HOSPITAL_COMMUNITY)
Admission: AD | Admit: 2019-09-07 | Discharge: 2019-09-10 | DRG: 885 | Disposition: A | Discharge status: Home / Self Care | Payer: Federal, State, Local not specified - Other | Attending: Psychiatry | Admitting: Psychiatry

## 2019-09-07 ENCOUNTER — Encounter (HOSPITAL_COMMUNITY): Payer: Self-pay | Admitting: Psychiatry

## 2019-09-07 ENCOUNTER — Other Ambulatory Visit: Payer: Self-pay

## 2019-09-07 DIAGNOSIS — F3189 Other bipolar disorder: Secondary | ICD-10-CM | POA: Diagnosis not present

## 2019-09-07 DIAGNOSIS — F17210 Nicotine dependence, cigarettes, uncomplicated: Secondary | ICD-10-CM | POA: Diagnosis present

## 2019-09-07 DIAGNOSIS — F23 Brief psychotic disorder: Secondary | ICD-10-CM | POA: Diagnosis present

## 2019-09-07 DIAGNOSIS — F909 Attention-deficit hyperactivity disorder, unspecified type: Secondary | ICD-10-CM | POA: Diagnosis present

## 2019-09-07 MED ORDER — IBUPROFEN 600 MG PO TABS: 600.0000 mg | ORAL_TABLET | Freq: Three times a day (TID) | ORAL | Status: DC | PRN

## 2019-09-07 MED ORDER — OLANZAPINE 10 MG PO TBDP: 10.0000 mg | ORAL_TABLET | Freq: Three times a day (TID) | ORAL | Status: DC | PRN

## 2019-09-07 MED ORDER — ZIPRASIDONE MESYLATE 20 MG IM SOLR: 20.0000 mg | INTRAMUSCULAR | Status: DC | PRN

## 2019-09-07 MED ORDER — LORAZEPAM 1 MG PO TABS: 1.0000 mg | ORAL_TABLET | ORAL | Status: DC | PRN

## 2019-09-07 MED ORDER — OLANZAPINE 5 MG PO TABS
5.0000 mg | ORAL_TABLET | Freq: Every day | ORAL | Status: DC
  Administered 2019-09-07: 20:00:00 5 mg via ORAL
  Filled 2019-09-07 (×3): qty 1
  Filled 2019-09-07: qty 2

## 2019-09-07 MED ORDER — OLANZAPINE 5 MG PO TBDP
5.0000 mg | ORAL_TABLET | Freq: Three times a day (TID) | ORAL | Status: DC | PRN
  Administered 2019-09-08 – 2019-09-09 (×2): 5 mg via ORAL
  Filled 2019-09-07 (×2): qty 1

## 2019-09-07 MED ORDER — ONDANSETRON HCL 4 MG PO TABS: 4.0000 mg | ORAL_TABLET | Freq: Three times a day (TID) | ORAL | Status: DC | PRN

## 2019-09-07 MED ORDER — ALUM & MAG HYDROXIDE-SIMETH 200-200-20 MG/5ML PO SUSP: 30.0000 mL | Freq: Four times a day (QID) | ORAL | Status: DC | PRN

## 2019-09-07 MED ORDER — ZOLPIDEM TARTRATE 5 MG PO TABS: 5.0000 mg | ORAL_TABLET | Freq: Every evening | ORAL | Status: DC | PRN

## 2019-09-07 MED ORDER — OLANZAPINE 2.5 MG PO TABS
2.5000 mg | ORAL_TABLET | ORAL | Status: DC
  Administered 2019-09-08: 2.5 mg via ORAL
  Filled 2019-09-07 (×4): qty 1

## 2019-09-07 NOTE — Discharge Instructions (Signed)
Patient discharged to behavioral health under IVC order work in police custody

## 2019-09-07 NOTE — Progress Notes (Signed)
   09/07/19 2015  Psych Admission Type (Psych Patients Only)  Admission Status Involuntary  Psychosocial Assessment  Patient Complaints None  Eye Contact Brief  Facial Expression Anxious;Animated  Affect Appropriate to circumstance  Speech Pressured;Rapid;English as a second language teacher;Restless  Appearance/Hygiene In scrubs  Behavior Characteristics Anxious  Mood Anxious  Thought Process  Coherency Circumstantial  Content WDL  Delusions Paranoid  Perception WDL  Hallucination None reported or observed  Judgment Impaired  Confusion None  Danger to Self  Current suicidal ideation? Denies  Danger to Others  Danger to Others None reported or observed

## 2019-09-07 NOTE — Tx Team (Signed)
Initial Treatment Plan 09/07/2019 3:57 PM [name] [id number]    PATIENT STRESSORS: Financial difficulties Medication change or noncompliance Occupational concerns   PATIENT STRENGTHS: Capable of independent living Communication skills Physical Health Religious Affiliation Supportive family/friends Work skills   PATIENT IDENTIFIED PROBLEMS: Alterations in mood (Anxiety & Irritability) "no, I don't want you calling nobody, I said nobody, I don't have no one and I don't trust them anymore".    Risk for self harm (suspicious and impulsive)                 DISCHARGE CRITERIA:  Improved stabilization in mood, thinking, and/or behavior Verbal commitment to aftercare and medication compliance  PRELIMINARY DISCHARGE PLAN: Outpatient therapy Return to previous living arrangement  PATIENT/FAMILY INVOLVEMENT: This treatment plan has been presented to and reviewed with the patient, Brendan Hogan. The patient have been given the opportunity to ask questions and make suggestions.  Sherryl Manges, RN 09/07/2019, 3:57 PM

## 2019-09-07 NOTE — ED Notes (Signed)
Lunch Tray Ordered 
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
## 2019-09-07 NOTE — Progress Notes (Signed)
Admission DAR Note:  Pt is a 24 y/o male transferred to [facility] from [facility] for continuation of care. Presents animated, anxious / restless and irritable on interactions, especially when family is mentioned "I don't have nobody, I don't want to talk about them". Denies SI, HI, AVH and pain when assessed. Per pt "My family took me to the hospital and said I need to see the doctor and then I never left, I don't trust nobody" when asked about events leading to admission. Per pt he's originally from [place], [place] in 2000 and lived in [place] till March 2021 when they moved to [place]. Stated he recently stopped taking his medications about "one month ago because it makes me feel lazy, it made me sit home all the time, I could not do anything" however, pt could not recall any of his home medications to Clinical research associate. He also stated that he recently quit his job at [employer] street "because it was stressful" since then he stated that he has not been able to sleep for "two weeks now, I lie down sleep for one hour, wake up, sleep for one hour wake up; I can't sleep all through the night". Currently lives with parents and other relatives who are reportedly supportive towards him. Skin assessment done and pt's skin is intact without areas of breakdown. Pt had no belongings at time of admission to [facility]. Unit orientation done, routines discussed and care plan reviewed with pt, reinforcement needed. Q 15 minutes safety checks initiated without self harm gestures or outburst to note thus far.

## 2019-09-07 NOTE — Plan of Care (Signed)
Pt accepted to [facility]; bed 504-01   Garlan Fillers, NP is the accepting provider.     Dr.Kumar is the attending doctor.     Call report to [phone]    [facility] ED Nurse to be notified ASAP.      Pt is involuntary and will be transported via [name].   Pt is scheduled to arrive at [facility] at 1 pm   [facility] Bed Placement/Flow Manager [employer] [facility] [phone]

## 2019-09-07 NOTE — ED Provider Notes (Signed)
Emergency Medicine Observation Re-evaluation Note  Brendan Hogan is a 24 y.o. male, seen on rounds today.  Pt initially presented to the ED for complaints of Medical Clearance Currently, the patient is sleeping. Per RN he has been calm and cooperative. No overnight events reported  Physical Exam  BP 115/70 (BP Location: Right Arm)   Pulse 70   Temp 97.8 F (36.6 C) (Oral)   Resp 18   SpO2 100%  Physical Exam PE: Constitutional: well-developed, well-nourished, no apparent distress HENT: normocephalic, atraumatic.  Cardiovascular: normal rate  Pulmonary/Chest: effort normal Abdominal: soft and nontender Musculoskeletal: full ROM, no edema Neurological: sleeping Skin: warm and dry, no rash, no diaphoresis Psychiatric: normal mood   ED Course / MDM  EKG:   I have reviewed the labs performed to date as well as medications administered while in observation.  Recent changes in the last 24 hours include none. Plan  Current plan is for psych reassessment.  Patient is under full IVC at this time.   Sherene Sires, PA-C 09/07/19 6222    Pricilla Loveless, MD 09/07/19 1034

## 2019-09-07 NOTE — BH Assessment (Signed)
[facility] Assessment Progress Note  Per Denzil Magnuson, NP, this pt requires psychiatric hospitalization at this time.  Jasmine has assigned pt to [facility] Rm 504-1; [facility] will be ready to receive pt at 13:00.  Pt's nurse, Lanora Manis, has been notified, and agrees  to call report to [phone].  Doylene Canning, [place] Behavioral Health Coordinator [phone]

## 2019-09-08 LAB — LIPID PANEL
Cholesterol: 159 mg/dL (ref 0–200)
HDL: 53 mg/dL (ref >40)
LDL Cholesterol: 92 mg/dL (ref 0–99)
Total CHOL/HDL Ratio: 3 RATIO
Triglycerides: 68 mg/dL (ref <150)
VLDL: 14 mg/dL (ref 0–40)

## 2019-09-08 LAB — HEMOGLOBIN A1C
Hgb A1c MFr Bld: 5 % (ref 4.8–5.6)
Mean Plasma Glucose: 96.8 mg/dL

## 2019-09-08 LAB — TSH: TSH: 1.56 u[IU]/mL (ref 0.350–4.500)

## 2019-09-08 MED ORDER — OLANZAPINE 7.5 MG PO TABS
7.5000 mg | ORAL_TABLET | Freq: Every day | ORAL | Status: DC
  Administered 2019-09-08: 22:00:00 7.5 mg via ORAL
  Filled 2019-09-08 (×2): qty 1

## 2019-09-08 NOTE — Progress Notes (Signed)
   09/08/19 2030  Psych Admission Type (Psych Patients Only)  Admission Status Involuntary  Psychosocial Assessment  Patient Complaints Hyperactivity  Eye Contact Fair  Facial Expression Animated;Anxious  Affect Anxious;Appropriate to circumstance  Speech Logical/coherent;Rapid;Pressured  Interaction Assertive  Motor Activity Fidgety;Restless  Appearance/Hygiene In scrubs  Behavior Characteristics Cooperative;Fidgety;Guarded  Mood Pleasant;Anxious  Thought Process  Coherency Circumstantial  Content WDL  Delusions None reported or observed  Perception WDL  Hallucination None reported or observed  Judgment Poor  Confusion None  Danger to Self  Current suicidal ideation? Denies  Danger to Others  Danger to Others None reported or observed   Pt seen at nurse's station. Pt's hyperactivity more pronounced since dayroom closed today. Pt seen in dayroom (once reopened at 1900) interacting in milieu. Pt takes medication without issue. States that medication is making him tired but also keeping mood even.

## 2019-09-08 NOTE — Plan of Care (Signed)
Progress note  D: pt found in bed. Pt wouldn't respond to this writers voice. Upon awakening later in the day, the pt was found to be animated and hyperactive. Pt seemed preoccupied with wrestling and "[employer]". Pt needed constant verbal redirection and was compliant. Pt was also found to try and deescalate other pt's. Pt is pleasant though. Pt denies si/hi/ah/vh and verbally agrees to approach staff if these become apparent or before harming themself/others while at bhh.  A: Pt provided support and encouragement. Pt given medication per protocol and standing orders. Q15m safety checks implemented and continued.  R: Pt safe on the unit. Will continue to monitor.  Pt progressing in the following metrics  Problem: Education: Goal: Knowledge of [facility] [facility] Education information/materials will improve Outcome: Progressing Goal: Emotional status will improve Outcome: Progressing Goal: Mental status will improve Outcome: Progressing Goal: Verbalization of understanding the information provided will improve Outcome: Progressing

## 2019-09-08 NOTE — Progress Notes (Signed)
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  Patient reports today that he is doing good.  He denies any suicidal homicidal ideations and denies any hallucinations.  His only complaint is that he feels that the dose of Zyprexa he took in the morning made him sleep too long during the day.  He is in agreement with taking all of it at bedtime.  Patient denies any medication side effects other than drowsiness.  He reports that he does not feel paranoid.  He continues to report that the only reason he came to the hospital was because of he was not getting enough sleep at home.  Principal Problem: Brief psychotic disorder (HCC) Diagnosis: Principal Problem:   Brief psychotic disorder (HCC)  Total Time spent with patient: 30 minutes  Past Psychiatric History:  as per collateral information from father, patient had one prior  admission in [facility] for similar presentation . At the time was not admitted to a psychiatric inpatient unit but was in the ED for several days, treated with Risperidone. At the time was diagnosed with " Psychosis".Father reports patient did not take the medication following discharge . He has had prior episodes of mood instability /irritability over the last 2-3 years , but father states that they have tended to worsen recently.  He denies history of suicide attempts or of self injurious behaviors. Patient denies history of psychosis.  Past Medical History:  Past Medical History:  Diagnosis Date  . Psychosis [facility]) 05/2019   [facility] [place]   . Seasonal allergies     Past Surgical History:  Procedure Laterality Date  . TONSILLECTOMY     Family History:  Family History  Problem Relation Age of Onset  . Hypertension Mother    Family Psychiatric  History: Denies Social History:  Social History   Substance and Sexual Activity  Alcohol Use Not  Currently   Comment: rare     Social History   Substance and Sexual Activity  Drug Use No    Social History   Socioeconomic History  . Marital status: Single    Spouse name: Not on file  . Number of children: Not on file  . Years of education: Not on file  . Highest education level: Not on file  Occupational History  . Not on file  Tobacco Use  . Smoking status: Current Every Day Smoker    Types: Cigarettes  . Smokeless tobacco: Former Engineer, water and Sexual Activity  . Alcohol use: Not Currently    Comment: rare  . Drug use: No  . Sexual activity: Not on file  Other Topics Concern  . Not on file  Social History Narrative  . Not on file   Social Determinants of Health   Financial Resource Strain:   . Difficulty of Paying Living Expenses:   Food Insecurity:   . Worried About Programme researcher, broadcasting/film/video in the Last Year:   . Barista in the Last Year:   Transportation Needs:   . Freight forwarder (Medical):   Marland Kitchen Lack of Transportation (Non-Medical):   Physical Activity:   . Days of Exercise per Week:   . Minutes of Exercise per Session:   Stress:   . Feeling of Stress :   Social Connections:   . Frequency of Communication with Friends and Family:   . Frequency  of Social Gatherings with Friends and Family:   . Attends Religious Services:   . Active Member of Clubs or Organizations:   . Attends Archivist Meetings:   Marland Kitchen Marital Status:    Additional Social History:                         Sleep: Good  Appetite:  Good  Current Medications: Current Facility-Administered Medications  Medication Dose Route Frequency Provider Last Rate Last Admin  . ibuprofen (ADVIL) tablet 600 mg  600 mg Oral Q8H PRN Mordecai Maes, NP      . OLANZapine zydis (ZYPREXA) disintegrating tablet 5 mg  5 mg Oral Q8H PRN Cobos, Myer Peer, MD   5 mg at 09/08/19 1247   And  . LORazepam (ATIVAN) tablet 1 mg  1 mg Oral PRN Cobos, Myer Peer, MD       And   . ziprasidone (GEODON) injection 20 mg  20 mg Intramuscular PRN Cobos, Myer Peer, MD      . OLANZapine (ZYPREXA) tablet 7.5 mg  7.5 mg Oral QHS Sharlyne Koeneman, Lowry Ram, FNP        Lab Results:  Results for orders placed or performed during the hospital encounter of 09/07/19 (from the past 48 hour(s))  Hemoglobin A1c     Status: None   Collection Time: 09/08/19  6:28 AM  Result Value Ref Range   Hgb A1c MFr Bld 5.0 4.8 - 5.6 %    Comment: (NOTE) Pre diabetes:          5.7%-6.4% Diabetes:              >6.4% Glycemic control for   <7.0% adults with diabetes    Mean Plasma Glucose 96.8 mg/dL    Comment: Performed at [facility], [place] [address], [place], [place] [address]  Lipid panel     Status: None   Collection Time: 09/08/19  6:28 AM  Result Value Ref Range   Cholesterol 159 0 - 200 mg/dL   Triglycerides 68 <150 mg/dL   HDL 53 >40 mg/dL   Total CHOL/HDL Ratio 3.0 RATIO   VLDL 14 0 - 40 mg/dL   LDL Cholesterol 92 0 - 99 mg/dL    Comment:        Total Cholesterol/HDL:CHD Risk Coronary Heart Disease Risk Table                     Men   Women  1/2 Average Risk   3.4   3.3  Average Risk       5.0   4.4  2 X Average Risk   9.6   7.1  3 X Average Risk  23.4   11.0        Use the calculated Patient Ratio above and the CHD Risk Table to determine the patient's CHD Risk.        ATP III CLASSIFICATION (LDL):  <100     mg/dL   Optimal  100-129  mg/dL   Near or Above                    Optimal  130-159  mg/dL   Borderline  160-189  mg/dL   High  >190     mg/dL   Very High Performed at [facility] [address] [place], [place] [address]   TSH     Status: None   Collection Time:  09/08/19  6:28 AM  Result Value Ref Range   TSH 1.560 0.350 - 4.500 uIU/mL    Comment: Performed by a 3rd Generation assay with a functional sensitivity of <=0.01 uIU/mL. Performed at [facility], [address], [place], [place] [address]     Blood  Alcohol level:  Lab Results  Component Value Date   ETH <10 09/05/2019    Metabolic Disorder Labs: Lab Results  Component Value Date   HGBA1C 5.0 09/08/2019   MPG 96.8 09/08/2019   No results found for: PROLACTIN Lab Results  Component Value Date   CHOL 159 09/08/2019   TRIG 68 09/08/2019   HDL 53 09/08/2019   CHOLHDL 3.0 09/08/2019   VLDL 14 09/08/2019   LDLCALC 92 09/08/2019    Physical Findings: AIMS: Facial and Oral Movements Muscles of Facial Expression: None, normal Lips and Perioral Area: None, normal Jaw: None, normal Tongue: None, normal,Extremity Movements Upper (arms, wrists, hands, fingers): None, normal Lower (legs, knees, ankles, toes): None, normal, Trunk Movements Neck, shoulders, hips: None, normal, Overall Severity Severity of abnormal movements (highest score from questions above): None, normal Incapacitation due to abnormal movements: None, normal Patient's awareness of abnormal movements (rate only patient's report): No Awareness, Dental Status Current problems with teeth and/or dentures?: No Does patient usually wear dentures?: No  CIWA:    COWS:     Musculoskeletal: Strength & Muscle Tone: within normal limits Gait & Station: normal Patient leans: N/A  Psychiatric Specialty Exam: Physical Exam  Nursing note and vitals reviewed. Constitutional: He is oriented to person, place, and time. He appears well-developed and well-nourished.  Cardiovascular: Normal rate.  Respiratory: Effort normal.  Musculoskeletal:        General: Normal range of motion.  Neurological: He is alert and oriented to person, place, and time.  Skin: Skin is warm.    Review of Systems  Constitutional: Negative.   HENT: Negative.   Eyes: Negative.   Respiratory: Negative.   Cardiovascular: Negative.   Gastrointestinal: Negative.   Genitourinary: Negative.   Musculoskeletal: Negative.   Skin: Negative.   Neurological: Negative.   Psychiatric/Behavioral:  Negative.     Blood pressure 106/67, pulse 88, temperature 97.7 F (36.5 C), temperature source Oral, resp. rate 18, height 5\' 7"  (1.702 m), weight 56.7 kg, SpO2 100 %.Body mass index is 19.58 kg/m.  General Appearance: Casual  Eye Contact:  Good  Speech:  Clear and Coherent and Normal Rate  Volume:  Normal  Mood:  Euthymic  Affect:  Congruent  Thought Process:  Coherent and Descriptions of Associations: Intact  Orientation:  Full (Time, Place, and Person)  Thought Content:  WDL  Suicidal Thoughts:  No  Homicidal Thoughts:  No  Memory:  Immediate;   Fair Recent;   Fair Remote;   Fair  Judgement:  Fair  Insight:  Fair  Psychomotor Activity:  Normal  Concentration:  Concentration: Fair  Recall:  of Knowledge:  Fair  Language:  Fair  Akathisia:  No  Handed:  Right  AIMS (if indicated):     Assets:  Communication Skills Desire for Improvement Housing Social Support Transportation  ADL's:  Intact  Cognition:  WNL  Sleep:  Number of Hours: 7.25   Assessment: Patient presents in his room he was pleasant, calm, cooperative.  He is continued to deny any suicidal or homicidal ideations denies any hallucinations.  Patient denies having any type of paranoia.  He reports that he feels safe here and  reports that he is sleeping well and that he has a good appetite.  Patient does show improvement with a sleep of 7.25 hours.  Patient has not shown any aggressive or agitated behavior while he has been here and has been attending groups as well.  I will have his Zyprexa changed to 7.5 mg p.o. nightly and discontinue the 2.5 daily.  If patient continues to show progress and is a has since he admitted to the hospital expect possible discharge within the next 1 to 2 days.  Treatment Plan Summary: Daily contact with patient to assess and evaluate symptoms and progress in treatment and Medication management Discontinue Zyprexa 2.5 mg daily Increase Zyprexa 7.5 mg p.o. nightly for  psychotic features Continue agitation protocol of Zyprexa, Ativan, and Geodon Encourage group therapy to this patient Continue every 15 minute safety checks  Maryfrances Bunnell, FNP 09/08/2019, 3:22 PM

## 2019-09-08 NOTE — Progress Notes (Signed)
Recreation Therapy Notes  INPATIENT RECREATION THERAPY ASSESSMENT  Patient Details Name: Brendan Hogan MRN: [id number] DOB: 14-Apr-1996 Today's Date: 09/08/2019       Information Obtained From: Patient  Able to Participate in Assessment/Interview: Yes  Patient Presentation: Alert  Reason for Admission (Per Patient): Other (Comments)(Pt stated he was having trouble sleeping.)  Patient Stressors: (None identified)  Coping Skills:   Sports, TV, Arguments, Exercise, Impulsivity, Avoidance  Leisure Interests (2+):  Exercise - Paediatric nurse, Sports - Football, Sports - Other (Comment)(Soccer)  Frequency of Recreation/Participation: Other (Comment)(Daily)  Awareness of Community Resources:  No  Expressed Interest in [employer] Information: No  [employer] of Residence:  [place]  Patient Main Form of Transportation: Walk  Patient Strengths:  "I don't know"  Patient Identified Areas of Improvement:  "I don't know"  Patient Goal for Hospitalization:  None  Current SI (including self-harm):  No  Current HI:  No  Current AVH: No  Staff Intervention Plan: Group Attendance, Collaborate with Interdisciplinary Treatment Team  Consent to Intern Participation: N/Hogan   Caroll Rancher, LRT/CTRS  Lillia Abed, Brendan Hogan 09/08/2019, 1:28 PM

## 2019-09-08 NOTE — Tx Team (Signed)
Interdisciplinary Treatment and Diagnostic Plan Update  09/08/2019 Time of Session: Brendan Hogan MRN: [id number]  Principal Diagnosis: <principal problem not specified>  Secondary Diagnoses: Active Problems:   Brief psychotic disorder ([place])   Current Medications:  Current Facility-Administered Medications  Medication Dose Route Frequency Provider Last Rate Last Admin  . ibuprofen (ADVIL) tablet 600 mg  600 mg Oral Q8H PRN Mordecai Maes, NP      . OLANZapine zydis (ZYPREXA) disintegrating tablet 5 mg  5 mg Oral Q8H PRN Cobos, Myer Peer, MD       And  . LORazepam (ATIVAN) tablet 1 mg  1 mg Oral PRN Cobos, Myer Peer, MD       And  . ziprasidone (GEODON) injection 20 mg  20 mg Intramuscular PRN Cobos, Myer Peer, MD      . OLANZapine (ZYPREXA) tablet 2.5 mg  2.5 mg Oral BH-q7a Cobos, Myer Peer, MD   2.5 mg at 09/08/19 0630  . OLANZapine (ZYPREXA) tablet 5 mg  5 mg Oral QHS Cobos, Myer Peer, MD   5 mg at 09/07/19 2020   PTA Medications: No medications prior to admission.    Patient Stressors: Financial difficulties Medication change or noncompliance Occupational concerns  Patient Strengths: Capable of independent living Armed forces logistics/support/administrative officer Physical Health Religious Affiliation Supportive family/friends Work skills  Treatment Modalities: Medication Management, Group therapy, Case management,  1 to 1 session with clinician, Psychoeducation, Recreational therapy.   Physician Treatment Plan for Primary Diagnosis: <principal problem not specified> Long Term Goal(s): Improvement in symptoms so as ready for discharge Improvement in symptoms so as ready for discharge   Short Term Goals: Ability to identify changes in lifestyle to reduce recurrence of condition will improve Ability to verbalize feelings will improve Ability to disclose and discuss suicidal ideas Ability to demonstrate self-control will improve Ability to identify and develop effective coping  behaviors will improve Ability to maintain clinical measurements within normal limits will improve Compliance with prescribed medications will improve Ability to identify changes in lifestyle to reduce recurrence of condition will improve Ability to verbalize feelings will improve Ability to disclose and discuss suicidal ideas Ability to demonstrate self-control will improve Ability to identify and develop effective coping behaviors will improve Ability to maintain clinical measurements within normal limits will improve  Medication Management: Evaluate patient's response, side effects, and tolerance of medication regimen.  Therapeutic Interventions: 1 to 1 sessions, Unit Group sessions and Medication administration.  Evaluation of Outcomes: Not Met  Physician Treatment Plan for Secondary Diagnosis: Active Problems:   Brief psychotic disorder ([place])  Long Term Goal(s): Improvement in symptoms so as ready for discharge Improvement in symptoms so as ready for discharge   Short Term Goals: Ability to identify changes in lifestyle to reduce recurrence of condition will improve Ability to verbalize feelings will improve Ability to disclose and discuss suicidal ideas Ability to demonstrate self-control will improve Ability to identify and develop effective coping behaviors will improve Ability to maintain clinical measurements within normal limits will improve Compliance with prescribed medications will improve Ability to identify changes in lifestyle to reduce recurrence of condition will improve Ability to verbalize feelings will improve Ability to disclose and discuss suicidal ideas Ability to demonstrate self-control will improve Ability to identify and develop effective coping behaviors will improve Ability to maintain clinical measurements within normal limits will improve     Medication Management: Evaluate patient's response, side effects, and tolerance of medication  regimen.  Therapeutic Interventions: 1 to 1 sessions, Unit  Group sessions and Medication administration.  Evaluation of Outcomes: Not Met   RN Treatment Plan for Primary Diagnosis: <principal problem not specified> Long Term Goal(s): Knowledge of disease and therapeutic regimen to maintain health will improve  Short Term Goals: Ability to verbalize feelings will improve, Ability to identify and develop effective coping behaviors will improve and Compliance with prescribed medications will improve  Medication Management: RN will administer medications as ordered by provider, will assess and evaluate patient's response and provide education to patient for prescribed medication. RN will report any adverse and/or side effects to prescribing provider.  Therapeutic Interventions: 1 on 1 counseling sessions, Psychoeducation, Medication administration, Evaluate responses to treatment, Monitor vital signs and CBGs as ordered, Perform/monitor CIWA, COWS, AIMS and Fall Risk screenings as ordered, Perform wound care treatments as ordered.  Evaluation of Outcomes: Not Met   LCSW Treatment Plan for Primary Diagnosis: <principal problem not specified> Long Term Goal(s): Safe transition to appropriate next level of care at discharge, Engage patient in therapeutic group addressing interpersonal concerns.  Short Term Goals: Engage patient in aftercare planning with referrals and resources, Increase social support, Facilitate acceptance of mental health diagnosis and concerns, Identify triggers associated with mental health/substance abuse issues and Increase skills for wellness and recovery  Therapeutic Interventions: Assess for all discharge needs, 1 to 1 time with Social worker, Explore available resources and support systems, Assess for adequacy in community support network, Educate family and significant other(s) on suicide prevention, Complete Psychosocial Assessment, Interpersonal group  therapy.  Evaluation of Outcomes: Not Met   Progress in Treatment: Attending groups: No. New to unit. Participating in groups: No. Taking medication as prescribed: No. Toleration medication: No. Family/Significant other contact made: No, will contact:  supports if consents are granted. Patient understands diagnosis: No. Discussing patient identified problems/goals with staff: Yes. Medical problems stabilized or resolved: Yes. Denies suicidal/homicidal ideation: Yes. Issues/concerns per patient self-inventory: Yes.  New problem(s) identified: Yes, Describe:  aggression toward family.  New Short Term/Long Term Goal(s): medication management for mood stabilization; elimination of SI thoughts; development of comprehensive mental wellness/sobriety plan.  Patient Goals:  Invited to participate, declined to attend.  Discharge Plan or Barriers: CSW assessing for appropriate referrals.   Reason for Continuation of Hospitalization: Aggression Anxiety Delusions  Mania Medication stabilization  Estimated Length of Stay: 5-7 days  Attendees: Patient: 09/08/2019 9:42 AM  Physician:  09/08/2019 9:42 AM  Nursing:  09/08/2019 9:42 AM  RN Care Manager: 09/08/2019 9:42 AM  Social Worker: Stephanie Acre, Latanya Presser 09/08/2019 9:42 AM  Recreational Therapist:  09/08/2019 9:42 AM  Other: Darletta Moll, [place] 09/08/2019 9:42 AM  Other: Marvia Pickles, NP 09/08/2019 9:42 AM  Other: 09/08/2019 9:42 AM    Scribe for Treatment Team: Joellen Jersey, [place] 09/08/2019 9:42 AM
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
## 2019-09-08 NOTE — [facility] Group Notes (Signed)
LCSW Group Note  09/08/2019   Participation Level:  Construction in the dayroom prevented CSW from facilitating group. There is not an alterative group space on the unit.    Keyani Rigdon C Stryder Poitra, LCSWA  09/08/2019 2:57 PM 

## 2019-09-08 NOTE — [facility] Suicide Risk Assessment (Signed)
[facility] INPATIENT:  Family/Significant Other Suicide Prevention Education  Suicide Prevention Education:  Patient Refusal for Family/Significant Other Suicide Prevention Education: The patient Brendan Hogan has refused to provide written consent for family/significant other to be provided Family/Significant Other Suicide Prevention Education during admission and/or prior to discharge.  Physician notified.  Darreld Mclean 09/08/2019, 2:36 PM

## 2019-09-08 NOTE — Progress Notes (Signed)
Recreation Therapy Notes  Date: 5.12.21 Time: 1000 Location: 500 Hall  Group Topic: Communication, Team Building, Problem Solving  Goal Area(s) Addresses:  Patient will effectively work with peer towards shared goal.  Patient will identify skill used to make activity successful.  Patient will identify how skills used during activity can be used to reach post d/c goals.   Intervention: Rubber Discs  Activity: Sharks in the Water.  Each person was given a rubber disc plus one extra disc for the group.  As a group, patients were to use the rubber discs to manuver the group from one end of the hall to the other and back.  If anyone stepped off the disc, the group start over.  Education: Social Skills, Discharge Planning.   Education Outcome: Acknowledges education/In group clarification offered/Needs additional education.   Clinical Observations/Feedback: Pt did not attend group.     Estefan Pattison, LRT/CTRS         Nini Cavan A 09/08/2019 11:28 AM 

## 2019-09-09 LAB — PROLACTIN: Prolactin: 26.5 ng/mL — ABNORMAL HIGH (ref 4.0–15.2)

## 2019-09-09 MED ORDER — NICOTINE POLACRILEX 2 MG MT GUM
2.0000 mg | CHEWING_GUM | OROMUCOSAL | Status: DC | PRN
  Administered 2019-09-09 – 2019-09-10 (×6): 2 mg via ORAL

## 2019-09-09 MED ORDER — OLANZAPINE 5 MG PO TBDP
ORAL_TABLET | ORAL | Status: AC
  Filled 2019-09-09: qty 2

## 2019-09-09 MED ORDER — OLANZAPINE 5 MG PO TBDP
7.5000 mg | ORAL_TABLET | Freq: Every day | ORAL | Status: DC
  Administered 2019-09-09: 7.5 mg via ORAL
  Filled 2019-09-09 (×3): qty 1.5

## 2019-09-09 MED ORDER — OLANZAPINE 5 MG PO TBDP
5.0000 mg | ORAL_TABLET | Freq: Every day | ORAL | Status: DC
  Administered 2019-09-10: 5 mg via ORAL
  Filled 2019-09-09 (×4): qty 1

## 2019-09-09 NOTE — Progress Notes (Signed)
When asked what he learned today, he said, "nothing". He did share in group that he enjoyed playing basketball with his peers. He did not share a goal for tomorrow.

## 2019-09-09 NOTE — Plan of Care (Signed)
Progress note  D: pt found in bed; compliant with medication administration. Pt continued to be hyperactive this morning and this behavior was agitating other patients. Pt was compliant with PRN medication which proved to decrease their energy level and give better insight with intrusiveness. Pt denies any physical complaints or pain. Pt continues to pace but is pleasant. Pt denies si/hi/ah/vh and verbally agrees to approach staff if these become apparent or before harming themself/others while at [facility].  A: Pt provided support and encouragement. Pt given medication per protocol and standing orders. Q65m safety checks implemented and continued.  R: Pt safe on the unit. Will continue to monitor.  Pt progressing in the following metrics  Problem: Activity: Goal: Interest or engagement in activities will improve Outcome: Progressing Goal: Sleeping patterns will improve Outcome: Progressing   Problem: Coping: Goal: Ability to verbalize frustrations and anger appropriately will improve Outcome: Progressing Goal: Ability to demonstrate self-control will improve Outcome: Progressing

## 2019-09-09 NOTE — Progress Notes (Signed)
Pt has new order for Zypexa Zydis 7.5 mg at bedtime. This med not in Pyxis or pt bin. Sent message to pharmacy and was told to override and pull (2) 5 mg Zyprexa ODT tabs and half one. Pt given 1.5 tablets of Zyprexa ODT equalling 7.5 mg.

## 2019-09-09 NOTE — Progress Notes (Signed)
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  Patient reports today that he is feeling good except for he states that he feels a little groggy and he thinks he is getting too much medication.  Patient denies any suicidal or homicidal ideations and denies any hallucinations.  Patient reports that he feels he is getting closer to being ready to go home and that he plans on living with his parents when he discharges.  He states agreement with continue his medications and following up with outpatient once he discharges.  He reports that we are allowed to contact his parents to verify where he is going to stay.  Patient denies any paranoia.  He also denies having any medication side effects and reports sleeping and eating well.  Principal Problem: Brief psychotic disorder (HCC) Diagnosis: Principal Problem:   Brief psychotic disorder (HCC)  Total Time spent with patient: 30 minutes  Past Psychiatric History: as per collateral information from father, patient had one prior admission in [facility] for similar presentation . At the time was not admitted to a psychiatric inpatient unit but was in the ED for several days, treated with Risperidone. At the time was diagnosed with " Psychosis".Father reports patient did not take the medication following discharge . He has had prior episodes of mood instability /irritability over the last 2-3 years , but father states that they have tended to worsen recently. He denies history of suicide attempts or of self injurious behaviors. Patient denies history of psychosis  Past Medical History:  Past Medical History:  Diagnosis Date  . Psychosis [facility]) 05/2019   [facility]   . Seasonal allergies     Past Surgical History:  Procedure Laterality Date  . TONSILLECTOMY     Family History:  Family History  Problem Relation Age of Onset  .  Hypertension Mother    Family Psychiatric  History: Denies Social History:  Social History   Substance and Sexual Activity  Alcohol Use Not Currently   Comment: rare     Social History   Substance and Sexual Activity  Drug Use No    Social History   Socioeconomic History  . Marital status: Single    Spouse name: Not on file  . Number of children: Not on file  . Years of education: Not on file  . Highest education level: Not on file  Occupational History  . Not on file  Tobacco Use  . Smoking status: Current Every Day Smoker    Types: Cigarettes  . Smokeless tobacco: Former Engineer, water and Sexual Activity  . Alcohol use: Not Currently    Comment: rare  . Drug use: No  . Sexual activity: Not on file  Other Topics Concern  . Not on file  Social History Narrative  . Not on file   Social Determinants of Health   Financial Resource Strain:   . Difficulty of Paying Living Expenses:   Food Insecurity:   . Worried About Programme researcher, broadcasting/film/video in the Last Year:   . Barista in the Last Year:   Transportation Needs:   . Freight forwarder (Medical):   Marland Kitchen Lack of Transportation (Non-Medical):   Physical Activity:   . Days of Exercise per Week:   . Minutes of Exercise per Session:   Stress:   . Feeling of Stress :  Social Connections:   . Frequency of Communication with Friends and Family:   . Frequency of Social Gatherings with Friends and Family:   . Attends Religious Services:   . Active Member of Clubs or Organizations:   . Attends Archivist Meetings:   Marland Kitchen Marital Status:    Additional Social History:                         Sleep: Good  Appetite:  Good  Current Medications: Current Facility-Administered Medications  Medication Dose Route Frequency Provider Last Rate Last Admin  . ibuprofen (ADVIL) tablet 600 mg  600 mg Oral Q8H PRN Mordecai Maes, NP      . OLANZapine zydis (ZYPREXA) disintegrating tablet 5 mg  5 mg  Oral Q8H PRN Cobos, Myer Peer, MD   5 mg at 09/09/19 0731   And  . LORazepam (ATIVAN) tablet 1 mg  1 mg Oral PRN Cobos, Myer Peer, MD       And  . ziprasidone (GEODON) injection 20 mg  20 mg Intramuscular PRN Cobos, Myer Peer, MD      . nicotine polacrilex (NICORETTE) gum 2 mg  2 mg Oral PRN Kristee Angus, Lowry Ram, FNP   2 mg at 09/09/19 0905  . [START ON 09/10/2019] OLANZapine zydis (ZYPREXA) disintegrating tablet 5 mg  5 mg Oral Daily Olanna Percifield, Lowry Ram, FNP      . OLANZapine zydis (ZYPREXA) disintegrating tablet 7.5 mg  7.5 mg Oral QHS Carley Strickling, Lowry Ram, FNP        Lab Results:  Results for orders placed or performed during the hospital encounter of 09/07/19 (from the past 48 hour(s))  Hemoglobin A1c     Status: None   Collection Time: 09/08/19  6:28 AM  Result Value Ref Range   Hgb A1c MFr Bld 5.0 4.8 - 5.6 %    Comment: (NOTE) Pre diabetes:          5.7%-6.4% Diabetes:              >6.4% Glycemic control for   <7.0% adults with diabetes    Mean Plasma Glucose 96.8 mg/dL    Comment: Performed at [facility], [place] [address] [place], [place] [address]  Lipid panel     Status: None   Collection Time: 09/08/19  6:28 AM  Result Value Ref Range   Cholesterol 159 0 - 200 mg/dL   Triglycerides 68 <150 mg/dL   HDL 53 >40 mg/dL   Total CHOL/HDL Ratio 3.0 RATIO   VLDL 14 0 - 40 mg/dL   LDL Cholesterol 92 0 - 99 mg/dL    Comment:        Total Cholesterol/HDL:CHD Risk Coronary Heart Disease Risk Table                     Men   Women  1/2 Average Risk   3.4   3.3  Average Risk       5.0   4.4  2 X Average Risk   9.6   7.1  3 X Average Risk  23.4   11.0        Use the calculated Patient Ratio above and the CHD Risk Table to determine the patient's CHD Risk.        ATP III CLASSIFICATION (LDL):  <100     mg/dL   Optimal  100-129  mg/dL   Near or Above  Optimal  130-159  mg/dL   Borderline  626-948  mg/dL   High  >546     mg/dL   Very High Performed at [facility], [address] [place], [place] [address]   Prolactin     Status: Abnormal   Collection Time: 09/08/19  6:28 AM  Result Value Ref Range   Prolactin 26.5 (H) 4.0 - 15.2 ng/mL    Comment: (NOTE) Performed At: [facility] [address] [place], [place] [id number] Jolene Schimke MD HB:[id number]   TSH     Status: None   Collection Time: 09/08/19  6:28 AM  Result Value Ref Range   TSH 1.560 0.350 - 4.500 uIU/mL    Comment: Performed by a 3rd Generation assay with a functional sensitivity of <=0.01 uIU/mL. Performed at [facility], [address] [place], [place] [address]     Blood Alcohol level:  Lab Results  Component Value Date   ETH <10 09/05/2019    Metabolic Disorder Labs: Lab Results  Component Value Date   HGBA1C 5.0 09/08/2019   MPG 96.8 09/08/2019   Lab Results  Component Value Date   PROLACTIN 26.5 (H) 09/08/2019   Lab Results  Component Value Date   CHOL 159 09/08/2019   TRIG 68 09/08/2019   HDL 53 09/08/2019   CHOLHDL 3.0 09/08/2019   VLDL 14 09/08/2019   LDLCALC 92 09/08/2019    Physical Findings: AIMS: Facial and Oral Movements Muscles of Facial Expression: None, normal Lips and Perioral Area: None, normal Jaw: None, normal Tongue: None, normal,Extremity Movements Upper (arms, wrists, hands, fingers): None, normal Lower (legs, knees, ankles, toes): None, normal, Trunk Movements Neck, shoulders, hips: None, normal, Overall Severity Severity of abnormal movements (highest score from questions above): None, normal Incapacitation due to abnormal movements: None, normal Patient's awareness of abnormal movements (rate only patient's report): No Awareness, Dental Status Current problems with teeth and/or dentures?: No Does patient usually wear dentures?: No  CIWA:    COWS:     Musculoskeletal: Strength & Muscle Tone: within normal limits Gait & Station: normal Patient leans:  N/A  Psychiatric Specialty Exam: Physical Exam  Nursing note and vitals reviewed. Constitutional: He is oriented to person, place, and time. He appears well-developed and well-nourished.  Cardiovascular: Normal rate.  Respiratory: Effort normal.  Musculoskeletal:        General: Normal range of motion.  Neurological: He is alert and oriented to person, place, and time.  Skin: Skin is warm.  Psychiatric: His speech is rapid and/or pressured.    Review of Systems  Constitutional: Negative.   HENT: Negative.   Eyes: Negative.   Respiratory: Negative.   Cardiovascular: Negative.   Gastrointestinal: Negative.   Genitourinary: Negative.   Musculoskeletal: Negative.   Skin: Negative.   Neurological: Negative.   Psychiatric/Behavioral: Negative.     Blood pressure 110/77, pulse 74, temperature 97.9 F (36.6 C), temperature source Oral, resp. rate 18, height 5\' 7"  (1.702 m), weight 56.7 kg, SpO2 100 %.Body mass index is 19.58 kg/m.  General Appearance: Casual  Eye Contact:  Fair  Speech:  Pressured  Volume:  Normal  Mood:  Euthymic  Affect:  Congruent  Thought Process:  Coherent and Descriptions of Associations: Intact  Orientation:  Full (Time, Place, and Person)  Thought Content:  WDL  Suicidal Thoughts:  No  Homicidal Thoughts:  No  Memory:  Immediate;   Fair Recent;   Fair Remote;   Fair  Judgement:  Fair  Insight:  Fair  Psychomotor Activity:  Increased  Concentration:  Concentration: Fair  Recall:  [employer] of Knowledge:  Fair  Language:  Fair  Akathisia:  No  Handed:  Right  AIMS (if indicated):     Assets:  Desire for Improvement Housing Social Support Transportation  ADL's:  Intact  Cognition:  WNL  Sleep:  Number of Hours: 6.75   Assessment: Patient presents interacting with peers and staff on the milieu and is pleasant, calm, cooperative.  Patient does have a little bit of hyperactivity as well as some pressured speech.  He is able to carry on a  conversation though.  This morning the patient needed a as needed Zyprexa Zydis as he appear to be getting worked up per the report.  Patient was informed of the sedation of the medication and possibly feeling groggy the next morning.  However, his dose of Zyprexa was increased last night and he also had received another dose today.  We will change the Zyprexa to Zydis 7.5 mg nightly and 5 mg p.o. daily.  With continued improvement potential discharge over the weekend.  Treatment Plan Summary: Daily contact with patient to assess and evaluate symptoms and progress in treatment and Medication management Continue Nicorette gum 2 mg as needed for smoking cessation Change to Zyprexa Zydis 7.5 mg p.o. nightly for psychosis Start Zyprexa Zydis 5 mg p.o. daily for psychosis Continue agitation protocol of Zyprexa Zydis, Ativan, and Geodon Encourage group therapy participation Continue every 15 minute safety checks Potential discharge in the next 2 to 3 days  Maryfrances Bunnell, FNP 09/09/2019, 11:12 AM

## 2019-09-09 NOTE — Progress Notes (Signed)
   09/09/19 2100  Psych Admission Type (Psych Patients Only)  Admission Status Involuntary  Psychosocial Assessment  Patient Complaints Hyperactivity;Anxiety  Eye Contact Fair  Facial Expression Animated;Anxious  Affect Anxious  Speech Logical/coherent;Pressured  Interaction Assertive;Attention-seeking;Childlike  Motor Activity Fidgety;Restless  Appearance/Hygiene In scrubs;Unremarkable  Behavior Characteristics Cooperative;Fidgety  Mood Anxious;Pleasant  Thought Process  Coherency Circumstantial;Concrete thinking  Content Obsessions  Delusions None reported or observed  Perception WDL  Hallucination None reported or observed  Judgment Poor  Confusion None  Danger to Self  Current suicidal ideation? Denies  Danger to Others  Danger to Others None reported or observed

## 2019-09-09 NOTE — [facility] Group Notes (Signed)
[facility] Mental Health Association Group Therapy 09/09/2019 2:33 PM  Type of Therapy: Mental Health Association Presentation  Participation Level: Active  Participation Quality: Attentive  Affect: Appropriate  Cognitive: Oriented  Insight: Developing/Improving  Engagement in Therapy: Engaged  Modes of Intervention: Discussion, Education and Socialization   Summary of Progress/Problems: Mental Health Association (MHA) Speaker came to talk about his personal journey with mental health. The pt processed ways by which to relate to the speaker. MHA speaker provided handouts and educational information pertaining to groups and services offered by the MHA. Pt was engaged in speaker's presentation and was receptive to resources provided.   Brady Schiller, MSW, LCSWA 09/09/2019 2:33 PM 

## 2019-09-09 NOTE — Progress Notes (Signed)
Recreation Therapy Notes  Date: 5.13.21 Time: 1000 Location: 500 Hall  Group Topic: Coping Skills  Goal Area(s) Addresses:  Patient will identify positive coping skills. Patient will identify benefit of using coping skills post d/c.  Behavioral Response: Engaged  Intervention: Game  Activity: Chartered loss adjuster.  Patients would play game according to regular rules.  In addition to identifying a coping skill for each piece they move.  Patients could not repeat any coping skill that had been named.  If the [place] tower fell over, the game would start over.  Education: Pharmacologist, Building control surveyor.   Education Outcome: Acknowledges understanding/In group clarification offered/Needs additional education.   Clinical Observations/Feedback: Pt was appropriate and engaged with activity.  Pt expressed never playing Jenga before.  Pt seemed to enjoy himself.  Pt stated some of his coping skills were watching tv, playing sports, taking medication and talking to people.    Caroll Rancher, LRT/CTRS        Lillia Abed, Emiley Digiacomo A 09/09/2019 11:15 AM

## 2019-09-10 DIAGNOSIS — F3189 Other bipolar disorder: Secondary | ICD-10-CM

## 2019-09-10 MED ORDER — OLANZAPINE 5 MG PO TBDP: 7.5000 mg | ORAL_TABLET | Freq: Every day | ORAL | 1 refills | Status: DC

## 2019-09-10 MED ORDER — OLANZAPINE 5 MG PO TBDP: 5.0000 mg | ORAL_TABLET | Freq: Every day | ORAL | 1 refills | Status: DC

## 2019-09-10 NOTE — Progress Notes (Signed)
Recreation Therapy Notes  INPATIENT RECREATION TR PLAN  Patient Details Name: Brendan Hogan MRN: [id number] DOB: 12-26-1995 Today's Date: 09/10/2019  Rec Therapy Plan Is patient appropriate for Therapeutic Recreation?: Yes Treatment times per week: about 3 days Estimated Length of Stay: 5-7 days TR Treatment/Interventions: Group participation (Comment)  Discharge Criteria Pt will be discharged from therapy if:: Discharged Treatment plan/goals/alternatives discussed and agreed upon by:: Patient/family  Discharge Summary Short term goals set: See patient care plan Short term goals met: Complete Progress toward goals comments: Groups attended Which groups?: Coping skills, Other (Comment)(Team Building) Reason goals not met: None Therapeutic equipment acquired: N/A Reason patient discharged from therapy: Discharge from hospital Pt/family agrees with progress & goals achieved: Yes Date patient discharged from therapy: 09/10/19     [name], LRT/CTRS  [name], [name] 09/10/2019, 11:58 AM

## 2019-09-10 NOTE — [facility] Suicide Risk Assessment (Signed)
[facility] Discharge Suicide Risk Assessment   Principal Problem: Brief psychotic disorder [facility]) Discharge Diagnoses: Principal Problem:   Brief psychotic disorder ([place])   Total Time spent with patient: 30 minutes  Musculoskeletal: Strength & Muscle Tone: within normal limits Gait & Station: normal Patient leans: N/A  Psychiatric Specialty Exam: Review of Systems no chest pain, no shortness of breath, no nausea, no vomiting, no rash  Blood pressure 115/61, pulse 81, temperature 97.6 F (36.4 C), temperature source Oral, resp. rate 18, height 5\' 7"  (1.702 m), weight 56.7 kg, SpO2 100 %.Body mass index is 19.58 kg/m.  General Appearance: Improving grooming  Eye Contact::  Good  Speech:  Normal Rate-not loud or pressured at this time  Volume:  Normal  Mood:  Reports "I feel okay".  Denies depression  Affect:  Reactive, not irritable or as expansive today  Thought Process:  Linear and Descriptions of Associations: Intact  Orientation:  Other:  Fully alert and attentive  Thought Content:  No hallucinations, no delusions expressed  Suicidal Thoughts:  No currently denies any suicidal or self-injurious ideations.  Denies any homicidal or violent ideations.  Specifically also denies any violent or homicidal ideations towards family  Homicidal Thoughts:  No  Memory:  Recent and remote grossly intact  Judgement:  Other:  Fair/improving  Insight:  Fair/improving  Psychomotor Activity:  Normal no psychomotor agitation at present  Concentration:  Improving  Recall:  Good  Fund of Knowledge:Good  Language: Good  Akathisia:  Negative  Handed:  Right  AIMS (if indicated):     Assets:  Communication Skills Desire for Improvement Resilience  Sleep:  Number of Hours: 6.75  Cognition: WNL  ADL's:  Intact   Mental Status Per Nursing Assessment::   On Admission:  NA  Demographic Factors:  24 year old, single, no children, lives with parents, currently unemployed  Loss  Factors: Unemployment  Historical Factors: History of a prior psychiatric admission for similar presentation.  Denies alcohol or drug abuse.  Risk Reduction Factors:   Living with another person, especially a relative, Positive social support and Positive coping skills or problem solving skills  Continued Clinical Symptoms:  Today patient presents alert, pleasant on approach, without psychomotor agitation.  Does not exhibit pressured speech.  States his mood is "okay" "good".  Denies depression.  Affect is not currently irritable or as expansive.  Thought process appears linear.  Denies suicidal or homicidal ideations.  Denies hallucinations and does not appear internally preoccupied at this time.  No delusions are expressed.  He presents future oriented and looking forward to returning home. With his expressed consent I spoke with his mother via phone.  Mother corroborates that patient is doing better and is hoping for him to be discharged back home today.  Patient spoke with his mother on the phone and was noted to be much calmer and less irritable than he had been when first admitted. As per staff, patient has been visible in dayroom, going to some groups, interacting with peers, at times presenting with attention seeking/childlike behaviors but redirectable and no overt agitation or disruptive behaviors. He denies medication side effects.  We have reviewed side effects to include potential risk of motor/movement disorders/weight gain/metabolic disorders.  I have also reviewed the importance of continuing to take his medication and follow-up with outpatient psychiatric management to minimize risk of decompensation/recurrence of symptoms  Cognitive Features That Contribute To Risk:  No gross cognitive deficits noted upon discharge. Is alert , attentive, and oriented x 3  Suicide Risk:  Mild:  Suicidal ideation of limited frequency, intensity, duration, and specificity.  There are no  identifiable plans, no associated intent, mild dysphoria and related symptoms, good self-control (both objective and subjective assessment), few other risk factors, and identifiable protective factors, including available and accessible social support.  Follow-up Information    [facility] Follow up on 09/16/2019.   Why: You have an appointment on 09/16/19 at 11:30 pm.  This will be a Virtual Tele-health appointment.  Please have your discharge summary available.   Contact information: [address] [place] [address] [phone]           Plan Of Care/Follow-up recommendations:  Activity:  As tolerated Diet:  Regular Tests:  NA Other:  See below  There are no current grounds for further involuntary commitment.  Patient is expressing readiness for discharge and leaving unit in good spirits with a plan of returning home.  As noted his parents agree with discharge and are looking forward to him returning home as well.  Follow-up as above.  Craige Cotta, MD 09/10/2019, 10:57 AM

## 2019-09-10 NOTE — Plan of Care (Signed)
Pt was able to demonstrate no more than 2 impulsive behaviors at completion of recreation therapy group sessions.  Brendan Hogan, LRT/CTRS 

## 2019-09-10 NOTE — Plan of Care (Addendum)
Discharge note  Patient verbalizes readiness for discharge. Follow up plan explained, AVS, Transition record and SRA given. Prescriptions and teaching provided: teaching refused. Belongings returned and signed for: no belongings on discharge. Suicide safety plan completed and signed. Patient verbalizes understanding. Patient denies SI/HI and assures this Clinical research associate they will seek assistance should that change. Patient discharged to lobby where mother was waiting.   Problem: Education: Goal: Knowledge of [facility] Education information/materials will improve Outcome: Adequate for Discharge Goal: Emotional status will improve Outcome: Adequate for Discharge Goal: Mental status will improve Outcome: Adequate for Discharge Goal: Verbalization of understanding the information provided will improve Outcome: Adequate for Discharge   Problem: Activity: Goal: Interest or engagement in activities will improve Outcome: Adequate for Discharge Goal: Sleeping patterns will improve Outcome: Adequate for Discharge   Problem: Coping: Goal: Ability to verbalize frustrations and anger appropriately will improve Outcome: Adequate for Discharge Goal: Ability to demonstrate self-control will improve Outcome: Adequate for Discharge   Problem: Health Behavior/Discharge Planning: Goal: Identification of resources available to assist in meeting health care needs will improve Outcome: Adequate for Discharge Goal: Compliance with treatment plan for underlying cause of condition will improve Outcome: Adequate for Discharge   Problem: Physical Regulation: Goal: Ability to maintain clinical measurements within normal limits will improve Outcome: Adequate for Discharge   Problem: Safety: Goal: Periods of time without injury will increase Outcome: Adequate for Discharge   Problem: Medication: Goal: Compliance with prescribed medication regimen will improve Outcome: Adequate for Discharge   Problem:  Education: Goal: Ability to state activities that reduce stress will improve Outcome: Adequate for Discharge

## 2019-09-10 NOTE — Progress Notes (Signed)
  [facility] Adult Case Management Discharge Plan :  Will you be returning to the same living situation after discharge:  Yes,  with parents. At discharge, do you have transportation home?: Yes,  parents to pick up. Do you have the ability to pay for your medications: No.  Release of information consent forms completed and in the chart;  Patient's signature needed at discharge.  Patient to Follow up at: Follow-up Information    [facility] Follow up on 09/16/2019.   Why: You have an appointment on 09/16/19 at 11:30 pm.  This will be a Virtual Tele-health appointment.  Please have your discharge summary available.   Contact information: [address] [place] [address] [phone]           Next level of care provider has access to [facility] Link:no  Safety Planning and Suicide Prevention discussed: Yes,  with patient.  Have you used any form of tobacco in the last 30 days? (Cigarettes, Smokeless Tobacco, Cigars, and/or Pipes): Yes  Has patient been referred to the Quitline?: N/A patient is not a smoker  Patient has been referred for addiction treatment: N/A  Otelia Santee, LCSW 09/10/2019, 11:05 AM

## 2019-09-10 NOTE — Progress Notes (Signed)
Recreation Therapy Notes  Date: 5.14.21 Time: 1000 Location: [address]  Group Topic: Communication, Team Building, Problem Solving  Goal Area(s) Addresses:  Patient will effectively work with peer towards shared goal.  Patient will identify skill used to make activity successful.  Patient will identify how skills used during activity can be used to reach post d/c goals.   Behavioral Response: Engaged  Intervention: STEM Activity   Activity:  In groups, patients were asked to build a freestanding tower as tall as they could that could stand on its own using 12 pipe cleaners.  Along the way, the groups will experience budget cuts in which they will lose the use of one hand and the ability to talk to each other.  Patients will regain the use of these functions when money is added back to the budget.  Education: Pharmacist, community, Building control surveyor.   Education Outcome: Acknowledges education  Clinical Observations/Feedback: Pt had a hard time understanding the concept of the activity but soon gained understanding the more he worked with his group.  Pt took some time to calm down from being so anxious.  Pt expressed the group used teamwork to complete the activity.  Pt also expressed the beginning of the activity was hard because he didn't understand it.    Caroll Rancher, LRT/CTRS      Caroll Rancher A 09/10/2019 11:37 AM

## 2019-09-10 NOTE — Discharge Summary (Addendum)
Physician Discharge Summary Note  Patient:  Brendan Hogan is an 24 y.o., male MRN:  [id number] DOB:  09-28-1995 Patient phone:  [phone] (home)  Patient address:   [place] [address],  Total Time spent with patient: 30 minutes  Date of Admission:  09/07/2019 Date of Discharge: 09/10/19  Reason for Admission:  24 year old male . Lives with parents . Presents as fair historian. States " my parents brought me to the hospital because of my sleep", but that he does not feel he needs to be on an inpatient unit. States " my sleep cycle has been weird- I can't sleep for a full 5-6 hours", and explains he wakes up after an hour or two. He explains this has been going on for about two weeks. He states he does not know why his sleep has been poor. As per ED notes, his  parents expressed concern that he has been easily angered /agitated and as per ED note, patient became loud and angry with his parents as they attempted to provide collateral information.He presented with rapid , pressured speech, agitation in ED , and was given Geodon for agitation.  Principal Problem: Brief psychotic disorder [facility]) Discharge Diagnoses: Principal Problem:   Brief psychotic disorder [facility])   Past Psychiatric History: as per collateral information from father, patient had one prior  admission in [facility] for similar presentation . At the time was not admitted to a psychiatric inpatient unit but was in the ED for several days, treated with Risperidone. At the time was diagnosed with " Psychosis".Father reports patient did not take the medication following discharge . He has had prior episodes of mood instability /irritability over the last 2-3 years , but father states that they have tended to worsen recently.  He denies history of suicide attempts or of self injurious behaviors. Patient denies history of psychosis.   Past Medical History:  Past Medical History:  Diagnosis Date  . Psychosis  [facility]) 05/2019   [place]   . Seasonal allergies     Past Surgical History:  Procedure Laterality Date  . TONSILLECTOMY     Family History:  Family History  Problem Relation Age of Onset  . Hypertension Mother    Family Psychiatric  History: Denies Social History:  Social History   Substance and Sexual Activity  Alcohol Use Not Currently   Comment: rare     Social History   Substance and Sexual Activity  Drug Use No    Social History   Socioeconomic History  . Marital status: Single    Spouse name: Not on file  . Number of children: Not on file  . Years of education: Not on file  . Highest education level: Not on file  Occupational History  . Not on file  Tobacco Use  . Smoking status: Current Every Day Smoker    Types: Cigarettes  . Smokeless tobacco: Former Network engineer and Sexual Activity  . Alcohol use: Not Currently    Comment: rare  . Drug use: No  . Sexual activity: Not on file  Other Topics Concern  . Not on file  Social History Narrative  . Not on file   Social Determinants of Health   Financial Resource Strain:   . Difficulty of Paying Living Expenses:   Food Insecurity:   . Worried About Charity fundraiser in the Last Year:   . [place] in the  Last Year:   Transportation Needs:   . Freight forwarder (Medical):   Marland Kitchen Lack of Transportation (Non-Medical):   Physical Activity:   . Days of Exercise per Week:   . Minutes of Exercise per Session:   Stress:   . Feeling of Stress :   Social Connections:   . Frequency of Communication with Friends and Family:   . Frequency of Social Gatherings with Friends and Family:   . Attends Religious Services:   . Active Member of Clubs or Organizations:   . Attends Banker Meetings:   Marland Kitchen Marital Status:     Hospital Course:  Patient remained on the [facility] unit for 2 days. The patient stabilized on medication and therapy. Patient was discharged on Zyprexa 5 mg QAM  and 7.5 mg QHS. Patient has shown improvement with improved mood, affect, sleep, appetite, and interaction. Patient has attended group and participated. Patient has been seen in the day room interacting with peers and staff appropriately. Patient denies any SI/HI/AVH and contracts for safety. Patient agrees to follow up at [facility]. Patient is provided with prescriptions for their medications upon discharge.  Physical Findings: AIMS: Facial and Oral Movements Muscles of Facial Expression: None, normal Lips and Perioral Area: None, normal Jaw: None, normal Tongue: None, normal,Extremity Movements Upper (arms, wrists, hands, fingers): None, normal Lower (legs, knees, ankles, toes): None, normal, Trunk Movements Neck, shoulders, hips: None, normal, Overall Severity Severity of abnormal movements (highest score from questions above): None, normal Incapacitation due to abnormal movements: None, normal Patient's awareness of abnormal movements (rate only patient's report): No Awareness, Dental Status Current problems with teeth and/or dentures?: No Does patient usually wear dentures?: No  CIWA:    COWS:     Musculoskeletal: Strength & Muscle Tone: within normal limits Gait & Station: normal Patient leans: N/A  Psychiatric Specialty Exam: Physical Exam  Nursing note and vitals reviewed. Constitutional: He is oriented to person, place, and time. He appears well-developed and well-nourished.  Cardiovascular: Normal rate.  Respiratory: Effort normal.  Musculoskeletal:        General: Normal range of motion.  Neurological: He is alert and oriented to person, place, and time.  Skin: Skin is warm.    Review of Systems  Constitutional: Negative.   HENT: Negative.   Eyes: Negative.   Respiratory: Negative.   Cardiovascular: Negative.   Gastrointestinal: Negative.   Genitourinary: Negative.   Musculoskeletal: Negative.   Skin: Negative.   Neurological: Negative.    Psychiatric/Behavioral: Negative.     Blood pressure 115/61, pulse 81, temperature 97.6 F (36.4 C), temperature source Oral, resp. rate 18, height 5\' 7"  (1.702 m), weight 56.7 kg, SpO2 100 %.Body mass index is 19.58 kg/m.   General Appearance: Improving grooming  Eye Contact::  Good  Speech:  Normal Rate-not loud or pressured at this time  Volume:  Normal  Mood:  Reports "I feel okay".  Denies depression  Affect:  Reactive, not irritable or as expansive today  Thought Process:  Linear and Descriptions of Associations: Intact  Orientation:  Other:  Fully alert and attentive  Thought Content:  No hallucinations, no delusions expressed  Suicidal Thoughts:  No currently denies any suicidal or self-injurious ideations.  Denies any homicidal or violent ideations.  Specifically also denies any violent or homicidal ideations towards family  Homicidal Thoughts:  No  Memory:  Recent and remote grossly intact  Judgement:  Other:  Fair/improving  Insight:  Fair/improving  Psychomotor Activity:  Normal no psychomotor agitation at present  Concentration:  Improving  Recall:  Good  Fund of Knowledge:Good  Language: Good  Akathisia:  Negative  Handed:  Right  AIMS (if indicated):     Assets:  Communication Skills Desire for Improvement Resilience  Sleep:  Number of Hours: 6.75  Cognition: WNL  ADL's:  Intact   Have you used any form of tobacco in the last 30 days? (Cigarettes, Smokeless Tobacco, Cigars, and/or Pipes): Yes  Has this patient used any form of tobacco in the last 30 days? (Cigarettes, Smokeless Tobacco, Cigars, and/or Pipes) Yes, Yes, A prescription for an FDA-approved tobacco cessation medication was offered at discharge and the patient refused  Blood Alcohol level:  Lab Results  Component Value Date   [facility] <10 09/05/2019    Metabolic Disorder Labs:  Lab Results  Component Value Date   HGBA1C 5.0 09/08/2019   MPG 96.8 09/08/2019   Lab Results  Component Value Date    PROLACTIN 26.5 (H) 09/08/2019   Lab Results  Component Value Date   CHOL 159 09/08/2019   TRIG 68 09/08/2019   HDL 53 09/08/2019   CHOLHDL 3.0 09/08/2019   VLDL 14 09/08/2019   LDLCALC 92 09/08/2019    See Psychiatric Specialty Exam and Suicide Risk Assessment completed by Attending Physician prior to discharge.  Discharge destination:  Home  Is patient on multiple antipsychotic therapies at discharge:  No   Has Patient had three or more failed trials of antipsychotic monotherapy by history:  No  Recommended Plan for Multiple Antipsychotic Therapies: NA   Allergies as of 09/10/2019   No Known Allergies     Medication List    TAKE these medications     Indication  OLANZapine zydis 5 MG disintegrating tablet Commonly known as: ZYPREXA Take 1.5 tablets (7.5 mg total) by mouth at bedtime.  Indication: Psychosis   OLANZapine zydis 5 MG disintegrating tablet Commonly known as: ZYPREXA Take 1 tablet (5 mg total) by mouth daily. Start taking on: Sep 11, 2019  Indication: Psychosis      Follow-up Information    [facility] [employer]. Go to.   Specialty: Professional Counselor Why: Please go to this provider for services during their walk in hours.  Walk in hours are Monday through Friday, from 8:30 am to 12:00 pm and 1:00 pm to 2:30 pm.  Contact information: [facility] [place] [address] [place] [address] [phone]           Follow-up recommendations:  Continue activity as tolerated. Continue diet as recommended by your PCP. Ensure to keep all appointments with outpatient providers.  Comments:  Patient is instructed prior to discharge to: Take all medications as prescribed by his/her mental healthcare provider. Report any adverse effects and or reactions from the medicines to his/her outpatient provider promptly. Patient has been instructed & cautioned: To not engage in alcohol and or illegal drug use while on  prescription medicines. In the event of worsening symptoms, patient is instructed to call the crisis hotline, 911 and or go to the nearest ED for appropriate evaluation and treatment of symptoms. To follow-up with his/her primary care provider for your other medical issues, concerns and or health care needs.    Signed: Gerlene Burdock Money, FNP 09/10/2019, 9:18 AM   Patient seen, Suicide Assessment Completed.  Disposition Plan Reviewed

## 2019-10-18 ENCOUNTER — Other Ambulatory Visit: Payer: Self-pay

## 2019-10-18 ENCOUNTER — Emergency Department (HOSPITAL_COMMUNITY)
Admission: EM | Admit: 2019-10-18 | Discharge: 2019-10-19 | Disposition: A | Discharge status: Other Acute Inpatient Hospital | Payer: Self-pay | Attending: Emergency Medicine | Admitting: Emergency Medicine

## 2019-10-18 ENCOUNTER — Encounter (HOSPITAL_COMMUNITY): Payer: Self-pay | Admitting: Emergency Medicine

## 2019-10-18 DIAGNOSIS — F250 Schizoaffective disorder, bipolar type: Secondary | ICD-10-CM | POA: Insufficient documentation

## 2019-10-18 DIAGNOSIS — Z8659 Personal history of other mental and behavioral disorders: Secondary | ICD-10-CM

## 2019-10-18 DIAGNOSIS — Z20822 Contact with and (suspected) exposure to covid-19: Secondary | ICD-10-CM | POA: Insufficient documentation

## 2019-10-18 DIAGNOSIS — Z046 Encounter for general psychiatric examination, requested by authority: Secondary | ICD-10-CM

## 2019-10-18 DIAGNOSIS — F17210 Nicotine dependence, cigarettes, uncomplicated: Secondary | ICD-10-CM | POA: Insufficient documentation

## 2019-10-18 DIAGNOSIS — R456 Violent behavior: Secondary | ICD-10-CM | POA: Insufficient documentation

## 2019-10-18 DIAGNOSIS — R440 Auditory hallucinations: Secondary | ICD-10-CM | POA: Insufficient documentation

## 2019-10-18 NOTE — ED Triage Notes (Signed)
Patient arrived with 2 [name] officers from home IVC for psychiatric treatment of his schizophrenia with auditory hallucinations , violent behavior towards family and non compliance with medications .

## 2019-10-19 ENCOUNTER — Inpatient Hospital Stay (HOSPITAL_COMMUNITY)
Admission: EM | Admit: 2019-10-19 | Discharge: 2019-10-25 | DRG: 885 | Disposition: A | Discharge status: Home / Self Care | Payer: Federal, State, Local not specified - Other | Attending: Psychiatry | Admitting: Psychiatry

## 2019-10-19 ENCOUNTER — Encounter (HOSPITAL_COMMUNITY): Payer: Self-pay | Admitting: Psychiatry

## 2019-10-19 DIAGNOSIS — G4700 Insomnia, unspecified: Secondary | ICD-10-CM | POA: Diagnosis present

## 2019-10-19 DIAGNOSIS — F201 Disorganized schizophrenia: Secondary | ICD-10-CM | POA: Diagnosis present

## 2019-10-19 DIAGNOSIS — Z9114 Patient's other noncompliance with medication regimen: Secondary | ICD-10-CM | POA: Diagnosis not present

## 2019-10-19 DIAGNOSIS — F209 Schizophrenia, unspecified: Secondary | ICD-10-CM | POA: Diagnosis present

## 2019-10-19 DIAGNOSIS — F419 Anxiety disorder, unspecified: Secondary | ICD-10-CM | POA: Diagnosis present

## 2019-10-19 DIAGNOSIS — Z20822 Contact with and (suspected) exposure to covid-19: Secondary | ICD-10-CM | POA: Diagnosis present

## 2019-10-19 DIAGNOSIS — Z79899 Other long term (current) drug therapy: Secondary | ICD-10-CM

## 2019-10-19 DIAGNOSIS — F23 Brief psychotic disorder: Secondary | ICD-10-CM | POA: Diagnosis present

## 2019-10-19 DIAGNOSIS — F17210 Nicotine dependence, cigarettes, uncomplicated: Secondary | ICD-10-CM | POA: Diagnosis present

## 2019-10-19 DIAGNOSIS — F200 Paranoid schizophrenia: Secondary | ICD-10-CM | POA: Diagnosis not present

## 2019-10-19 DIAGNOSIS — F29 Unspecified psychosis not due to a substance or known physiological condition: Secondary | ICD-10-CM | POA: Diagnosis present

## 2019-10-19 LAB — COMPREHENSIVE METABOLIC PANEL
ALT: 12 U/L (ref 0–44)
AST: 19 U/L (ref 15–41)
Albumin: 4.5 g/dL (ref 3.5–5.0)
Alkaline Phosphatase: 95 U/L (ref 38–126)
Anion gap: 12 (ref 5–15)
BUN: 12 mg/dL (ref 6–20)
CO2: 22 mmol/L (ref 22–32)
Calcium: 9.4 mg/dL (ref 8.9–10.3)
Chloride: 105 mmol/L (ref 98–111)
Creatinine, Ser: 1.03 mg/dL (ref 0.61–1.24)
GFR calc Af Amer: >60 mL/min (ref >60)
GFR calc non Af Amer: >60 mL/min (ref >60)
Glucose, Bld: 106 mg/dL — ABNORMAL HIGH (ref 70–99)
Potassium: 3.8 mmol/L (ref 3.5–5.1)
Sodium: 139 mmol/L (ref 135–145)
Total Bilirubin: 0.8 mg/dL (ref 0.3–1.2)
Total Protein: 7.2 g/dL (ref 6.5–8.1)

## 2019-10-19 LAB — CBC
HCT: 51.4 % (ref 39.0–52.0)
Hemoglobin: 16.8 g/dL (ref 13.0–17.0)
MCH: 29.3 pg (ref 26.0–34.0)
MCHC: 32.7 g/dL (ref 30.0–36.0)
MCV: 89.5 fL (ref 80.0–100.0)
Platelets: 205 10*3/uL (ref 150–400)
RBC: 5.74 MIL/uL (ref 4.22–5.81)
RDW: 12.3 % (ref 11.5–15.5)
WBC: 4.8 10*3/uL (ref 4.0–10.5)
nRBC: 0 % (ref 0.0–0.2)

## 2019-10-19 LAB — ETHANOL: Alcohol, Ethyl (B): <10 mg/dL (ref <10)

## 2019-10-19 LAB — SARS CORONAVIRUS 2 BY RT PCR (HOSPITAL ORDER, PERFORMED IN ~~LOC~~ HOSPITAL LAB): SARS Coronavirus 2: NEGATIVE

## 2019-10-19 LAB — RAPID URINE DRUG SCREEN, HOSP PERFORMED
Amphetamines: NOT DETECTED
Barbiturates: NOT DETECTED
Benzodiazepines: NOT DETECTED
Cocaine: NOT DETECTED
Opiates: NOT DETECTED
Tetrahydrocannabinol: NOT DETECTED

## 2019-10-19 LAB — SALICYLATE LEVEL: Salicylate Lvl: <7 mg/dL — ABNORMAL LOW (ref 7.0–30.0)

## 2019-10-19 LAB — ACETAMINOPHEN LEVEL: Acetaminophen (Tylenol), Serum: <10 ug/mL — ABNORMAL LOW (ref 10–30)

## 2019-10-19 MED ORDER — MAGNESIUM HYDROXIDE 400 MG/5ML PO SUSP: 30.0000 mL | Freq: Every day | ORAL | Status: DC | PRN

## 2019-10-19 MED ORDER — NICOTINE POLACRILEX 2 MG MT GUM
2.0000 mg | CHEWING_GUM | OROMUCOSAL | Status: DC | PRN
  Administered 2019-10-20 – 2019-10-24 (×9): 2 mg via ORAL
  Filled 2019-10-19 (×4): qty 1

## 2019-10-19 MED ORDER — ALUM & MAG HYDROXIDE-SIMETH 200-200-20 MG/5ML PO SUSP: 30.0000 mL | ORAL | Status: DC | PRN

## 2019-10-19 MED ORDER — ZIPRASIDONE MESYLATE 20 MG IM SOLR: 20.0000 mg | INTRAMUSCULAR | Status: DC | PRN

## 2019-10-19 MED ORDER — LORAZEPAM 1 MG PO TABS: 1.0000 mg | ORAL_TABLET | ORAL | Status: DC | PRN

## 2019-10-19 MED ORDER — OLANZAPINE 10 MG PO TBDP: 10.0000 mg | ORAL_TABLET | Freq: Three times a day (TID) | ORAL | Status: DC | PRN

## 2019-10-19 MED ORDER — OLANZAPINE 5 MG PO TBDP
7.5000 mg | ORAL_TABLET | Freq: Every day | ORAL | Status: DC
  Filled 2019-10-19 (×3): qty 1.5

## 2019-10-19 MED ORDER — TRAZODONE HCL 100 MG PO TABS
100.0000 mg | ORAL_TABLET | Freq: Every evening | ORAL | Status: DC | PRN
  Administered 2019-10-24: 100 mg via ORAL
  Filled 2019-10-19: qty 1

## 2019-10-19 MED ORDER — ENSURE ENLIVE PO LIQD
237.0000 mL | Freq: Two times a day (BID) | ORAL | Status: DC
  Administered 2019-10-19 – 2019-10-24 (×8): 237 mL via ORAL

## 2019-10-19 MED ORDER — HYDROXYZINE HCL 25 MG PO TABS: 25.0000 mg | ORAL_TABLET | Freq: Three times a day (TID) | ORAL | Status: DC | PRN

## 2019-10-19 MED ORDER — OLANZAPINE 5 MG PO TBDP
5.0000 mg | ORAL_TABLET | Freq: Every day | ORAL | Status: DC
  Administered 2019-10-19 – 2019-10-20 (×2): 5 mg via ORAL
  Filled 2019-10-19 (×5): qty 1

## 2019-10-19 MED ORDER — ACETAMINOPHEN 325 MG PO TABS: 650.0000 mg | ORAL_TABLET | Freq: Four times a day (QID) | ORAL | Status: DC | PRN

## 2019-10-19 NOTE — ED Notes (Signed)
Mother updated on pt acceptance at [facility]

## 2019-10-19 NOTE — Progress Notes (Signed)
Pt accepted to [facility] bed 503-1  Shuvon Rankin, NP is the accepting provider.    Dr. Lucianne Muss is the attending provider.    Call report to [phone]    Tobi Bastos @ [facility] ED notified.     Pt is involuntary and will be transported by law enforcement.   Pt is scheduled to arrive at [facility] after 2pm. Pt will also need a negative Covid test result before being transported.   Wells Guiles, LCSW, LCAS Disposition CSW [facility]/TTS [phone]

## 2019-10-19 NOTE — ED Provider Notes (Signed)
[facility] EMERGENCY DEPARTMENT Provider Note   CSN: [id number] Arrival date & time: 10/18/19  2324     History Chief Complaint  Patient presents with   Schizophrenia / IVC    Brendan Hogan is a 24 y.o. male.  Patient with history of schizophrenia presents with [name] under Involuntary Petition stating he has been having auditory hallucinations and becoming violent with family members after medication noncompliance. The patient does not provide any history. He does deny pain, vomiting, recent illness.   The history is provided by the police. No language interpreter was used.       Past Medical History:  Diagnosis Date   Psychosis (HCC) 05/2019   [facility]    Seasonal allergies     Patient Active Problem List   Diagnosis Date Noted   Other bipolar disorder [facility])    Brief psychotic disorder (HCC) 09/07/2019    Past Surgical History:  Procedure Laterality Date   TONSILLECTOMY         Family History  Problem Relation Age of Onset   Hypertension Mother     Social History   Tobacco Use   Smoking status: Current Every Day Smoker    Types: Cigarettes   Smokeless tobacco: Former Neurosurgeon  Substance Use Topics   Alcohol use: Not Currently    Comment: rare   Drug use: No    Home Medications Prior to Admission medications   Medication Sig Start Date End Date Taking? Authorizing Provider  OLANZapine zydis (ZYPREXA) 5 MG disintegrating tablet Take 1 tablet (5 mg total) by mouth daily. 09/11/19   Money, Gerlene Burdock, FNP  OLANZapine zydis (ZYPREXA) 5 MG disintegrating tablet Take 1.5 tablets (7.5 mg total) by mouth at bedtime. 09/10/19   Money, Gerlene Burdock, FNP    Allergies    Patient has no known allergies.  Review of Systems   Review of Systems  Unable to perform ROS: Psychiatric disorder    Physical Exam Updated Vital Signs BP 131/90 (BP Location: Left Arm)    Pulse 100    Temp 98.8 F (37.1 C) (Oral)    Resp 16    Ht  5\' 9"  (1.753 m)    Wt 62 kg    SpO2 100%    BMI 20.18 kg/m   Physical Exam Constitutional:      General: He is not in acute distress.    Appearance: He is well-developed.  HENT:     Head: Atraumatic.  Cardiovascular:     Rate and Rhythm: Normal rate.  Pulmonary:     Effort: Pulmonary effort is normal.  Musculoskeletal:        General: Normal range of motion.     Cervical back: Normal range of motion.  Skin:    General: Skin is warm and dry.  Neurological:     Mental Status: He is alert and oriented to person, place, and time.  Psychiatric:     Comments: Patient largely nonverbal, not answering questions.     ED Results / Procedures / Treatments   Labs (all labs ordered are listed, but only abnormal results are displayed) Labs Reviewed  COMPREHENSIVE METABOLIC PANEL - Abnormal; Notable for the following components:      Result Value   Glucose, Bld 106 (*)    All other components within normal limits  SALICYLATE LEVEL - Abnormal; Notable for the following components:   Salicylate Lvl <7.0 (*)    All other components within normal limits  ACETAMINOPHEN LEVEL - Abnormal; Notable for the following components:   Acetaminophen (Tylenol), Serum <10 (*)    All other components within normal limits  SARS CORONAVIRUS 2 BY RT PCR (HOSPITAL ORDER, [place] [facility])  ETHANOL  CBC  RAPID URINE DRUG SCREEN, HOSP PERFORMED    EKG None  Radiology No results found.  Procedures Procedures (including critical care time)  Medications Ordered in ED Medications - No data to display  ED Course  I have reviewed the triage vital signs and the nursing notes.  Pertinent labs & imaging results that were available during my care of the patient were reviewed by me and considered in my medical decision making (see chart for details).    MDM Rules/Calculators/A&P                          Patient to ED under IVC for uncontrolled schizophrenia and medication  noncompliance, including violent behavior toward family.   VSS. Labs reviewed and are essentially normal. TTS requested to assist in disposition.  Final Clinical Impression(s) / ED Diagnoses Final diagnoses:  None   1. IVC 2. History of schizophrenia  Rx / DC Orders ED Discharge Orders    None       Charlann Lange, PA-C 10/19/19 0303    Merryl Hacker, MD 10/19/19 [phone]
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
## 2019-10-19 NOTE — ED Notes (Signed)
Lunch Tray Ordered @ 1048. °

## 2019-10-19 NOTE — ED Notes (Addendum)
Pt mother Zeinab cell [phone]  Would like to be updated frequently on pt stay at [facility]. All pt belongings sent with mother Zeinab.

## 2019-10-19 NOTE — ED Notes (Signed)
Called [name] for transport of pt to [facility].

## 2019-10-19 NOTE — Progress Notes (Signed)
Pt refused HS Zyprexa. Pt encouraged to talk to the doctor     10/19/19 2300  Psych Admission Type (Psych Patients Only)  Admission Status Involuntary  Psychosocial Assessment  Patient Complaints Anxiety;Hyperactivity  Eye Contact Fair  Facial Expression Animated;Anxious  Affect Anxious  Speech Rapid;Pressured  Interaction Assertive;Avoidant;Childlike  Motor Activity Fidgety;Restless  Appearance/Hygiene In scrubs  Behavior Characteristics Appropriate to situation  Mood Anxious;Suspicious;Preoccupied  Thought Administrator, sports thinking  Content WDL  Delusions None reported or observed  Perception WDL  Hallucination None reported or observed  Judgment Poor  Confusion None  Danger to Self  Current suicidal ideation? Denies  Danger to Others  Danger to Others None reported or observed
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
## 2019-10-19 NOTE — ED Notes (Signed)
b[facility] 503-1 arrive after 2p.

## 2019-10-19 NOTE — Tx Team (Signed)
Initial Treatment Plan 10/19/2019 4:55 PM Brendan Hogan VTX:[id number]    PATIENT STRESSORS: Financial difficulties Health problems Medication change or noncompliance   PATIENT STRENGTHS: Average or above average intelligence Supportive family/friends   PATIENT IDENTIFIED PROBLEMS: Psychosis  Anxiety  Medication noncompliance                 DISCHARGE CRITERIA:  Ability to meet basic life and health needs Adequate post-discharge living arrangements Motivation to continue treatment in a less acute level of care  PRELIMINARY DISCHARGE PLAN: Attend aftercare/continuing care group Outpatient therapy Return to previous living arrangement  PATIENT/FAMILY INVOLVEMENT: This treatment plan has been presented to and reviewed with the patient, Brendan Hogan, and/or family member.  The patient and family have been given the opportunity to ask questions and make suggestions.  Clarene Critchley, RN 10/19/2019, 4:55 PM

## 2019-10-19 NOTE — Progress Notes (Signed)
Pt very paranoid on the unit, pt refused night Zyprexa this evening. Pt informed to talk to the doctor in the morning.

## 2019-10-19 NOTE — ED Notes (Signed)
Report given to BH RN.  

## 2019-10-20 DIAGNOSIS — F200 Paranoid schizophrenia: Secondary | ICD-10-CM

## 2019-10-20 NOTE — Plan of Care (Signed)
Progress note  D: pt found in bed; noncompliant with morning medication administration so far. Pt was guarded and minimal during morning assessment. Pt had selective mutism. Pt stated they were sleeping and would come for medications later. Pt denies any physical complaints or pain. Pt denies si/hi/ah/vh and verbally agrees to approach staff if these become apparent or before harming themself/others while at bhh.  A: Pt provided support and encouragement. Pt given medication per protocol and standing orders. Q50m safety checks implemented and continued.  R: Pt safe on the unit. Will continue to monitor.  Pt progressing in the following metrics  Problem: Education: Goal: Knowledge of [place] [facility] Education information/materials will improve Outcome: Progressing Goal: Emotional status will improve Outcome: Progressing Goal: Mental status will improve Outcome: Progressing Goal: Verbalization of understanding the information provided will improve Outcome: Progressing

## 2019-10-20 NOTE — Progress Notes (Signed)
Recreation Therapy Notes  Patient admitted to unit 6.22.21. Due to admission within last year, no new assessment conducted at this time. Last assessment conducted 5.12.21. Patient reports no current stressors.  Pt stated reason for admission was "parents got mad at me".  Pt stated coping skills continue to be sports, tv, arguments, exercise, impulsivity and avoidance.  Leisure interests were identified as lifting weights, football and soccer.  Pt stated he doesn't do activities very often.  Pt identified strengths as math.  Area of improvement identified as "not coming into the hospital".  Patient denies SI, HI, AVH at this time. Patient reports goal of "take as little medication as I can".    Caroll Rancher, LRT/CTRS   Lillia Abed, Mortimer Bair A 10/20/2019 2:10 PM

## 2019-10-20 NOTE — BHH Suicide Risk Assessment (Signed)
[facility] INPATIENT:  Family/Significant Other Suicide Prevention Education  Suicide Prevention Education:  Patient Refusal for Family/Significant Other Suicide Prevention Education: The patient Brendan Hogan has refused to provide written consent for family/significant other to be provided Family/Significant Other Suicide Prevention Education during admission and/or prior to discharge.  Physician notified.   SPE completed with patient, as patient refused to consent to family contact. SPI pamphlet provided to pt and pt was encouraged to share information with support network, ask questions, and talk about any concerns relating to SPE. Patient denies access to guns/firearms and verbalized understanding of information provided. Mobile Crisis information also provided to patient.   Ruthann Cancer MSW, [employer] Clincal Social Worker  [facility]

## 2019-10-20 NOTE — B[facility] Group Notes (Signed)
LCSW Group Therapy Notes 10/20/2019 2:02 PM  Type of Therapy and Topic: Group Therapy: Overcoming Obstacles  Participation Level: Active  Description of Group:  In this group patients will be encouraged to explore what they see as obstacles to their own wellness and recovery. They will be guided to discuss their thoughts, feelings, and behaviors related to these obstacles. The group will process together ways to cope with barriers, with attention given to specific choices patients can make. Each patient will be challenged to identify changes they are motivated to make in order to overcome their obstacles. This group will be process-oriented, with patients participating in exploration of their own experiences as well as giving and receiving support and challenge from other group members.  Therapeutic Goals: 1. Patient will identify personal and current obstacles as they relate to admission. 2. Patient will identify barriers that currently interfere with their wellness or overcoming obstacles.  3. Patient will identify feelings, thought process and behaviors related to these barriers. 4. Patient will identify two changes they are willing to make to overcome these obstacles:   Summary of Patient Progress Jacobey remained present in group throughout the duration. 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   Therapeutic Modalities:  Cognitive Behavioral Therapy Solution Focused Therapy Motivational Interviewing Relapse Prevention Therapy  Enid Cutter, MSW, [facility] 10/20/2019 2:02 PM

## 2019-10-20 NOTE — Progress Notes (Signed)
Nutrition Brief Note RD working remotely.   Patient identified on the Malnutrition Screening Tool (MST) Report  Wt Readings from Last 15 Encounters:  10/19/19 56.7 kg  10/18/19 62 kg  09/07/19 56.7 kg  09/17/11 48.5 kg (14 %, Z= -1.07)*   * Growth percentiles are based on CDC (Boys, 2-20 Years) data.    Body mass index is 19.58 kg/m. Patient meets criteria for normal weight based on current BMI.  Current diet order is Regular and patient is eating as desired for meals and snacks. Labs and medications reviewed.   No nutrition interventions warranted at this time. If nutrition issues arise, please consult RD.      Trenton Gammon, MS, RD, LDN, CNSC Inpatient Clinical Dietitian RD pager # available in [facility]  After hours/weekend pager # available in [facility]
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
## 2019-10-20 NOTE — Tx Team (Signed)
clarInterdisciplinary Treatment and Diagnostic Plan Update  10/20/2019 Time of Session: 9:15am Brendan Hogan MRN: [id number]  Principal Diagnosis: <principal problem not specified>  Secondary Diagnoses: Active Problems:   Schizophrenia ([place])   Psychosis ([place])   Current Medications:  Current Facility-Administered Medications  Medication Dose Route Frequency Provider Last Rate Last Admin  . acetaminophen (TYLENOL) tablet 650 mg  650 mg Oral Q6H PRN Mordecai Maes, NP      . alum & mag hydroxide-simeth (MAALOX/MYLANTA) 200-200-20 MG/5ML suspension 30 mL  30 mL Oral Q4H PRN Mordecai Maes, NP      . feeding supplement (ENSURE ENLIVE) (ENSURE ENLIVE) liquid 237 mL  237 mL Oral BID BM Sharma Covert, MD   237 mL at 10/19/19 1745  . hydrOXYzine (ATARAX/VISTARIL) tablet 25 mg  25 mg Oral TID PRN Sharma Covert, MD      . OLANZapine zydis (ZYPREXA) disintegrating tablet 10 mg  10 mg Oral Q8H PRN Sharma Covert, MD       And  . LORazepam (ATIVAN) tablet 1 mg  1 mg Oral PRN Sharma Covert, MD       And  . ziprasidone (GEODON) injection 20 mg  20 mg Intramuscular PRN Sharma Covert, MD      . magnesium hydroxide (MILK OF MAGNESIA) suspension 30 mL  30 mL Oral Daily PRN Sharma Covert, MD      . nicotine polacrilex (NICORETTE) gum 2 mg  2 mg Oral PRN Anike, Adaku C, NP      . OLANZapine zydis (ZYPREXA) disintegrating tablet 5 mg  5 mg Oral Daily Sharma Covert, MD   5 mg at 10/19/19 1745  . OLANZapine zydis (ZYPREXA) disintegrating tablet 7.5 mg  7.5 mg Oral QHS Sharma Covert, MD      . traZODone (DESYREL) tablet 100 mg  100 mg Oral QHS PRN Sharma Covert, MD       PTA Medications: Medications Prior to Admission  Medication Sig Dispense Refill Last Dose  . OLANZapine zydis (ZYPREXA) 5 MG disintegrating tablet Take 1 tablet (5 mg total) by mouth daily. (Patient not taking: Reported on 10/19/2019) 30 tablet 1   . OLANZapine zydis (ZYPREXA) 5 MG  disintegrating tablet Take 1.5 tablets (7.5 mg total) by mouth at bedtime. (Patient not taking: Reported on 10/19/2019) 45 tablet 1     Patient Stressors: Financial difficulties Health problems Medication change or noncompliance  Patient Strengths: Average or above average intelligence Supportive family/friends  Treatment Modalities: Medication Management, Group therapy, Case management,  1 to 1 session with clinician, Psychoeducation, Recreational therapy.   Physician Treatment Plan for Primary Diagnosis: <principal problem not specified> Long Term Goal(s):     Short Term Goals:    Medication Management: Evaluate patient's response, side effects, and tolerance of medication regimen.  Therapeutic Interventions: 1 to 1 sessions, Unit Group sessions and Medication administration.  Evaluation of Outcomes: Not Met  Physician Treatment Plan for Secondary Diagnosis: Active Problems:   Schizophrenia ([place])   Psychosis ([place])  Long Term Goal(s):     Short Term Goals:       Medication Management: Evaluate patient's response, side effects, and tolerance of medication regimen.  Therapeutic Interventions: 1 to 1 sessions, Unit Group sessions and Medication administration.  Evaluation of Outcomes: Not Met   RN Treatment Plan for Primary Diagnosis: <principal problem not specified> Long Term Goal(s): Knowledge of disease and therapeutic regimen to maintain health will improve  Short Term Goals: Ability to remain  free from injury will improve, Ability to verbalize frustration and anger appropriately will improve, Ability to identify and develop effective coping behaviors will improve and Compliance with prescribed medications will improve  Medication Management: RN will administer medications as ordered by provider, will assess and evaluate patient's response and provide education to patient for prescribed medication. RN will report any adverse and/or side effects to prescribing  provider.  Therapeutic Interventions: 1 on 1 counseling sessions, Psychoeducation, Medication administration, Evaluate responses to treatment, Monitor vital signs and CBGs as ordered, Perform/monitor CIWA, COWS, AIMS and Fall Risk screenings as ordered, Perform wound care treatments as ordered.  Evaluation of Outcomes: Not Met   LCSW Treatment Plan for Primary Diagnosis: <principal problem not specified> Long Term Goal(s): Safe transition to appropriate next level of care at discharge, Engage patient in therapeutic group addressing interpersonal concerns.  Short Term Goals: Engage patient in aftercare planning with referrals and resources, Increase social support, Increase emotional regulation, Identify triggers associated with mental health/substance abuse issues and Increase skills for wellness and recovery  Therapeutic Interventions: Assess for all discharge needs, 1 to 1 time with Social worker, Explore available resources and support systems, Assess for adequacy in community support network, Educate family and significant other(s) on suicide prevention, Complete Psychosocial Assessment, Interpersonal group therapy.  Evaluation of Outcomes: Not Met   Progress in Treatment: Attending groups: No. Participating in groups: No. Taking medication as prescribed: Yes. Toleration medication: Yes. Family/Significant other contact made: No, will contact:  if consent is given. Patient understands diagnosis: Yes. Discussing patient identified problems/goals with staff: Yes. Medical problems stabilized or resolved: Yes. Denies suicidal/homicidal ideation: Yes. Issues/concerns per patient self-inventory: No. Other: none  New problem(s) identified: No, Describe:  CSW will continue to assess.  New Short Term/Long Term Goal(s): medication stabilization, elimination of SI thoughts, development of comprehensive mental wellness plan.   Patient Goals:    Discharge Plan or Barriers: Patient recently  admitted. CSW will continue to follow and assess for appropriate referrals and possible discharge planning.   Reason for Continuation of Hospitalization: Delusions  Hallucinations Medication stabilization  Estimated Length of Stay: 3-5 days   Attendees: Patient: 10/20/2019   Physician:  10/20/2019   Nursing:  10/20/2019   RN Care Manager: 10/20/2019   Social Worker: Darletta Moll, Latanya Presser 10/20/2019   Recreational Therapist:  10/20/2019   Other:  10/20/2019   Other:  10/20/2019   Other: 10/20/2019     Scribe for Treatment Team: Vassie Moselle, [place] 10/20/2019 10:08 AM

## 2019-10-20 NOTE — Progress Notes (Signed)
Pt. Did not attend group. 
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
## 2019-10-21 MED ORDER — RISPERIDONE 2 MG PO TBDP
2.0000 mg | ORAL_TABLET | Freq: Every day | ORAL | Status: DC
  Administered 2019-10-21 – 2019-10-22 (×2): 2 mg via ORAL
  Filled 2019-10-21 (×3): qty 1

## 2019-10-21 MED ORDER — OLANZAPINE 10 MG PO TBDP: 10.0000 mg | ORAL_TABLET | Freq: Every day | ORAL | Status: DC

## 2019-10-21 MED ORDER — RISPERIDONE 1 MG PO TBDP
1.0000 mg | ORAL_TABLET | Freq: Every day | ORAL | Status: DC
  Administered 2019-10-21 – 2019-10-23 (×3): 1 mg via ORAL
  Filled 2019-10-21 (×4): qty 1

## 2019-10-21 MED ORDER — CARBAMAZEPINE 100 MG PO CHEW
100.0000 mg | CHEWABLE_TABLET | Freq: Two times a day (BID) | ORAL | Status: DC
  Administered 2019-10-21 – 2019-10-22 (×2): 100 mg via ORAL
  Filled 2019-10-21 (×4): qty 1
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
## 2019-10-21 NOTE — Progress Notes (Signed)
Pt continues to stated he wants to refuse his medication "I have the right to refuse medication" writer tied to educate pt on his IVC status    10/21/19 2000  Psych Admission Type (Psych Patients Only)  Admission Status Involuntary  Psychosocial Assessment  Patient Complaints Anxiety  Eye Contact Fair  Facial Expression Animated;Anxious;Pensive  Affect Anxious  Speech Argumentative;Logical/coherent  Interaction Assertive;Avoidant;Childlike  Motor Activity Fidgety;Restless  Appearance/Hygiene In scrubs  Behavior Characteristics Unwilling to participate  Mood Suspicious;Preoccupied  Thought Process  Coherency Concrete thinking  Content Paranoia  Delusions Paranoid  Perception WDL  Hallucination None reported or observed  Judgment Limited  Confusion None  Danger to Self  Current suicidal ideation? Denies  Danger to Others  Danger to Others None reported or observed

## 2019-10-21 NOTE — Progress Notes (Signed)
   10/21/19 0900  Psych Admission Type (Psych Patients Only)  Admission Status Involuntary  Psychosocial Assessment  Patient Complaints Anxiety  Eye Contact Fair  Facial Expression Animated;Anxious;Pensive  Affect Anxious  Speech Argumentative;Logical/coherent  Interaction Assertive;Avoidant;Childlike  Motor Activity Fidgety;Restless  Appearance/Hygiene In scrubs  Behavior Characteristics Unwilling to participate  Mood Preoccupied  Thought Process  Coherency Concrete thinking  Content Paranoia  Delusions Paranoid  Perception WDL  Hallucination None reported or observed  Judgment Limited  Confusion None  Danger to Self  Current suicidal ideation? Denies  Danger to Others  Danger to Others None reported or observed

## 2019-10-21 NOTE — Progress Notes (Signed)
Recreation Therapy Notes  Date: 6.24.21 Time: 0945 Location: [address] Dayroom  Group Topic: Self-Esteem  Goal Area(s) Addresses:  Patient will successfully identify positive attributes about themselves.  Patient will successfully identify benefit of improved self-esteem.   Intervention: Blank crest, markers, colored pencils, music  Activity: Crest of Arms.  Patients were given a blank crest.  In that crest, they were to identify four positive things that helped to make them who they are.  Patients could identify positive qualities, accomplishments, important dates, important people, etc.  LRT also played music requested by patients as they worked.  Education:  Self-Esteem, Building control surveyor.   Education Outcome: Acknowledges education/In group clarification offered/Needs additional education  Clinical Observations/Feedback: Pt did not attend group session.    Caroll Rancher, LRT/CTRS         Lillia Abed, Anessia [place] A 10/21/2019 10:45 AM

## 2019-10-21 NOTE — [facility] Group Notes (Signed)
Adult Psychoeducational Group Note  Date:  10/21/2019 Time:  9:53 PM  Group Topic/Focus:  Wrap-Up Group:   The focus of this group is to help patients review their daily goal of treatment and discuss progress on daily workbooks.    Participation Level:  Active  Participation Quality:  Attentive  Affect:  Appropriate  Cognitive:  Alert  Insight: Good  Engagement in Group:  Engaged  Modes of Intervention:  Education  Additional Comment  Jacalyn Lefevre 10/21/2019, 9:53 PM

## 2019-10-22 MED ORDER — CARBAMAZEPINE 100 MG PO CHEW
200.0000 mg | CHEWABLE_TABLET | Freq: Two times a day (BID) | ORAL | Status: DC
  Administered 2019-10-22 – 2019-10-23 (×2): 200 mg via ORAL
  Filled 2019-10-22 (×5): qty 2

## 2019-10-22 MED ORDER — HALOPERIDOL LACTATE 5 MG/ML IJ SOLN
10.0000 mg | Freq: Two times a day (BID) | INTRAMUSCULAR | Status: DC
  Filled 2019-10-22 (×8): qty 2

## 2019-10-22 MED ORDER — HALOPERIDOL 5 MG PO TABS
10.0000 mg | ORAL_TABLET | Freq: Two times a day (BID) | ORAL | Status: DC
  Administered 2019-10-22 – 2019-10-25 (×6): 10 mg via ORAL
  Filled 2019-10-22 (×9): qty 2

## 2019-10-22 NOTE — Progress Notes (Signed)
[facility] MD Progress Note  10/22/2019 1:40 PM Brendan Hogan  MRN:  [id number] Subjective:  Patient is a 24 year old male with a reported past psychiatric history significant for schizophrenia who presented to the [facility] emergency department under involuntary commitment by his father on 10/19/2019.  He apparently had been noncompliant with medications, and become physically aggressive and violent towards his father.  Objective: Patient is seen and examined.  Patient is a 24 year old male with the above-stated past psychiatric history who is seen in follow-up.  Nursing reported continued irritability this morning.  Patient apparently refuses medications last night.  His vital signs are stable, he is afebrile.  He slept 7.25 hours last night.  We discussed the possibility of long-acting antipsychotic medications, but he refuses for now.  The patient is on involuntary commitment at this point.  No new laboratories.  Principal Problem: <principal problem not specified> Diagnosis: Active Problems:   Schizophrenia ([place])   Psychosis ([place])  Total Time spent with patient: 15 minutes  Past Psychiatric History: See admission H&P  Past Medical History:  Past Medical History:  Diagnosis Date   Psychosis ([place]) 05/2019   [place]    Seasonal allergies     Past Surgical History:  Procedure Laterality Date   TONSILLECTOMY     Family History:  Family History  Problem Relation Age of Onset   Hypertension Mother    Family Psychiatric  History: The admission H&P Social History:  Social History   Substance and Sexual Activity  Alcohol Use Not Currently   Comment: rare     Social History   Substance and Sexual Activity  Drug Use No    Social History   Socioeconomic History   Marital status: Single    Spouse name: Not on file   Number of children: Not on file   Years of education: Not on file   Highest education level: Not on file  Occupational History   Not on  file  Tobacco Use   Smoking status: Current Every Day Smoker    Types: Cigarettes   Smokeless tobacco: Former Network engineer and Sexual Activity   Alcohol use: Not Currently    Comment: rare   Drug use: No   Sexual activity: Not on file  Other Topics Concern   Not on file  Social History Narrative   Not on file   Social Determinants of Health   Financial Resource Strain:    Difficulty of Paying Living Expenses:   Food Insecurity:    Worried About Charity fundraiser in the Last Year:    Arboriculturist in the Last Year:   Transportation Needs:    Film/video editor (Medical):    Lack of Transportation (Non-Medical):   Physical Activity:    Days of Exercise per Week:    Minutes of Exercise per Session:   Stress:    Feeling of Stress :   Social Connections:    Frequency of Communication with Friends and Family:    Frequency of Social Gatherings with Friends and Family:    Attends Religious Services:    Active Member of Clubs or Organizations:    Attends Archivist Meetings:    Marital Status:    Additional Social History:                         Sleep: Good  Appetite:  Good  Current Medications: Current Facility-Administered Medications  Medication Dose Route Frequency Provider Last Rate Last Admin   acetaminophen (TYLENOL) tablet 650 mg  650 mg Oral Q6H PRN Denzil Magnuson, NP       alum & mag hydroxide-simeth (MAALOX/MYLANTA) 200-200-20 MG/5ML suspension 30 mL  30 mL Oral Q4H PRN Denzil Magnuson, NP       carbamazepine (TEGRETOL) chewable tablet 200 mg  200 mg Oral BID Antonieta Pert, MD       feeding supplement (ENSURE ENLIVE) (ENSURE ENLIVE) liquid 237 mL  237 mL Oral BID BM Antonieta Pert, MD   237 mL at 10/21/19 1647   hydrOXYzine (ATARAX/VISTARIL) tablet 25 mg  25 mg Oral TID PRN Antonieta Pert, MD       OLANZapine zydis (ZYPREXA) disintegrating tablet 10 mg  10 mg Oral Q8H PRN Antonieta Pert, MD       And   LORazepam (ATIVAN) tablet 1 mg  1 mg Oral PRN Antonieta Pert, MD       And   ziprasidone (GEODON) injection 20 mg  20 mg Intramuscular PRN Antonieta Pert, MD       magnesium hydroxide (MILK OF MAGNESIA) suspension 30 mL  30 mL Oral Daily PRN Antonieta Pert, MD       nicotine polacrilex (NICORETTE) gum 2 mg  2 mg Oral PRN Anike, Adaku C, NP   2 mg at 10/22/19 1225   risperiDONE (RISPERDAL M-TABS) disintegrating tablet 1 mg  1 mg Oral Daily Antonieta Pert, MD   1 mg at 10/22/19 0836   risperiDONE (RISPERDAL M-TABS) disintegrating tablet 2 mg  2 mg Oral QHS Antonieta Pert, MD   2 mg at 10/21/19 2021   traZODone (DESYREL) tablet 100 mg  100 mg Oral QHS PRN Antonieta Pert, MD        Lab Results: No results found for this or any previous visit (from the past 48 hour(s)).  Blood Alcohol level:  Lab Results  Component Value Date   ETH <10 10/18/2019   ETH <10 09/05/2019    Metabolic Disorder Labs: Lab Results  Component Value Date   HGBA1C 5.0 09/08/2019   MPG 96.8 09/08/2019   Lab Results  Component Value Date   PROLACTIN 26.5 (H) 09/08/2019   Lab Results  Component Value Date   CHOL 159 09/08/2019   TRIG 68 09/08/2019   HDL 53 09/08/2019   CHOLHDL 3.0 09/08/2019   VLDL 14 09/08/2019   LDLCALC 92 09/08/2019    Physical Findings: AIMS: Facial and Oral Movements Muscles of Facial Expression: None, normal Lips and Perioral Area: None, normal Jaw: None, normal Tongue: None, normal,Extremity Movements Upper (arms, wrists, hands, fingers): None, normal Lower (legs, knees, ankles, toes): None, normal, Trunk Movements Neck, shoulders, hips: None, normal, Overall Severity Severity of abnormal movements (highest score from questions above): None, normal Incapacitation due to abnormal movements: None, normal Patient's awareness of abnormal movements (rate only patient's report): No Awareness, Dental Status Current problems with  teeth and/or dentures?: No Does patient usually wear dentures?: No  CIWA:    COWS:     Musculoskeletal: Strength & Muscle Tone: within normal limits Gait & Station: normal Patient leans: N/A  Psychiatric Specialty Exam: Physical Exam  Nursing note and vitals reviewed. Constitutional: He is oriented to person, place, and time.  HENT:  Head: Normocephalic and atraumatic.  Respiratory: Effort normal.  GI: Normal appearance.  Neurological: He is oriented to person, place, and time.    Review of  Systems  Blood pressure 106/61, pulse 91, temperature (!) 97.5 F (36.4 C), temperature source Oral, resp. rate 18, height 5\' 7"  (1.702 m), weight 56.7 kg, SpO2 100 %.Body mass index is 19.58 kg/m.  General Appearance: Casual  Eye Contact:  Minimal  Speech:  Pressured  Volume:  Increased  Mood:  Dysphoric and Irritable  Affect:  Congruent  Thought Process:  Goal Directed and Descriptions of Associations: Circumstantial  Orientation:  Negative  Thought Content:  Delusions  Suicidal Thoughts:  No  Homicidal Thoughts:  No  Memory:  Immediate;   Poor Recent;   Poor Remote;   Poor  Judgement:  Impaired  Insight:  Lacking  Psychomotor Activity:  Increased  Concentration:  Concentration: Fair and Attention Span: Fair  Recall:  of Knowledge:  Fair  Language:  Good  Akathisia:  Negative  Handed:  Right  AIMS (if indicated):     Assets:  Desire for Improvement Resilience  ADL's:  Intact  Cognition:  WNL  Sleep:  Number of Hours: 7.25     Treatment Plan Summary: Daily contact with patient to assess and evaluate symptoms and progress in treatment, Medication management and Plan : Patient is seen and examined.  Patient is a 24 year old male with the above-stated past psychiatric history who is seen in follow-up.   Diagnosis: 1.  Schizophrenia versus schizoaffective disorder 2.  Cannabis use disorder  Pertinent findings on examination today: 1.  Continued  irritability. 2.  Refused medications last p.m. 3.  Complete lack of insight  Plan: 1.  Medication was switched from Zyprexa to Risperdal secondary to his complaint of oversedation and also the plan that if he would agree to it long-acting injectable antipsychotics versus oral antipsychotics given his history of noncompliance. 2.  Continue Risperdal 1 mg p.o. daily and 2 mg p.o. nightly for psychosis and mood stability. 3.  If patient refuses oral medication then we will give haloperidol 10 mg p.o. or IM twice daily.  This is for psychosis and mood stability. 4.  Increase Tegretol to 200 mg p.o. twice daily for mood stability. 5.  Continue agitation protocol. 6.  Continue to encourage acceptance of long-acting injectable medications given history of noncompliance. 7.  Forced medication protocol if necessary. 8.  Disposition planning-in progress.  30, MD 10/22/2019, 1:40 PM

## 2019-10-23 MED ORDER — RISPERIDONE 2 MG PO TBDP
3.0000 mg | ORAL_TABLET | Freq: Every day | ORAL | Status: DC
  Administered 2019-10-23 – 2019-10-24 (×2): 3 mg via ORAL
  Filled 2019-10-23 (×3): qty 1

## 2019-10-23 MED ORDER — CARBAMAZEPINE 200 MG PO TABS
200.0000 mg | ORAL_TABLET | Freq: Two times a day (BID) | ORAL | Status: DC
  Administered 2019-10-23 – 2019-10-25 (×4): 200 mg via ORAL
  Filled 2019-10-23 (×7): qty 1

## 2019-10-23 NOTE — Progress Notes (Signed)
[facility] MD Progress Note  10/23/2019 11:54 AM Brendan Hogan  MRN:  [id number] Subjective:  Patient is a 24 year old male with a reported past psychiatric history significant for schizophrenia who presented to the [facility] emergency department under involuntary commitment by his father on 10/19/2019. He apparently had been noncompliant with medications, and become physically aggressive and violent towards his father.  Objective: Patient is seen and examined.  Patient is a 24 year old male with the above-stated past psychiatric history who is seen in follow-up.  He ended up taking the Risperdal last night.  He continues to complain of sedation with it.  He has no insight towards his illness.  He stated that this is all planned by his father.  He stated his father just wants him to make money for him to be rich.  His vital signs are stable, he is afebrile.  Nursing notes reflect that he slept 6 hours last night.  No new laboratories.  He denied auditory or visual hallucinations.  He denied suicidal or homicidal ideation.  Principal Problem: <principal problem not specified> Diagnosis: Active Problems:   Schizophrenia ([facility])   Psychosis ([facility])  Total Time spent with patient: 15 minutes  Past Psychiatric History: See admission H&P  Past Medical History:  Past Medical History:  Diagnosis Date   Psychosis ([facility]) 05/2019   [facility]    Seasonal allergies     Past Surgical History:  Procedure Laterality Date   TONSILLECTOMY     Family History:  Family History  Problem Relation Age of Onset   Hypertension Mother    Family Psychiatric  History: See admission H&P Social History:  Social History   Substance and Sexual Activity  Alcohol Use Not Currently   Comment: rare     Social History   Substance and Sexual Activity  Drug Use No    Social History   Socioeconomic History   Marital status: Single    Spouse name: Not on file   Number of children: Not on file    Years of education: Not on file   Highest education level: Not on file  Occupational History   Not on file  Tobacco Use   Smoking status: Current Every Day Smoker    Types: Cigarettes   Smokeless tobacco: Former Engineer, water and Sexual Activity   Alcohol use: Not Currently    Comment: rare   Drug use: No   Sexual activity: Not on file  Other Topics Concern   Not on file  Social History Narrative   Not on file   Social Determinants of Health   Financial Resource Strain:    Difficulty of Paying Living Expenses:   Food Insecurity:    Worried About Programme researcher, broadcasting/film/video in the Last Year:    Barista in the Last Year:   Transportation Needs:    Freight forwarder (Medical):    Lack of Transportation (Non-Medical):   Physical Activity:    Days of Exercise per Week:    Minutes of Exercise per Session:   Stress:    Feeling of Stress :   Social Connections:    Frequency of Communication with Friends and Family:    Frequency of Social Gatherings with Friends and Family:    Attends Religious Services:    Active Member of Clubs or Organizations:    Attends Banker Meetings:    Marital Status:    Additional Social History:  Sleep: Good  Appetite:  Good  Current Medications: Current Facility-Administered Medications  Medication Dose Route Frequency Provider Last Rate Last Admin   acetaminophen (TYLENOL) tablet 650 mg  650 mg Oral Q6H PRN Mordecai Maes, NP       alum & mag hydroxide-simeth (MAALOX/MYLANTA) 200-200-20 MG/5ML suspension 30 mL  30 mL Oral Q4H PRN Mordecai Maes, NP       feeding supplement (ENSURE ENLIVE) (ENSURE ENLIVE) liquid 237 mL  237 mL Oral BID BM Sharma Covert, MD   237 mL at 10/22/19 1430   haloperidol (HALDOL) tablet 10 mg  10 mg Oral BID Sharma Covert, MD   10 mg at 10/23/19 3536   Or   haloperidol lactate (HALDOL) injection 10 mg  10 mg  Intramuscular BID Sharma Covert, MD       hydrOXYzine (ATARAX/VISTARIL) tablet 25 mg  25 mg Oral TID PRN Sharma Covert, MD       OLANZapine zydis (ZYPREXA) disintegrating tablet 10 mg  10 mg Oral Q8H PRN Sharma Covert, MD       And   LORazepam (ATIVAN) tablet 1 mg  1 mg Oral PRN Sharma Covert, MD       And   ziprasidone (GEODON) injection 20 mg  20 mg Intramuscular PRN Sharma Covert, MD       magnesium hydroxide (MILK OF MAGNESIA) suspension 30 mL  30 mL Oral Daily PRN Sharma Covert, MD       nicotine polacrilex (NICORETTE) gum 2 mg  2 mg Oral PRN Anike, Adaku C, NP   2 mg at 10/22/19 1431   risperiDONE (RISPERDAL M-TABS) disintegrating tablet 3 mg  3 mg Oral QHS Sharma Covert, MD       traZODone (DESYREL) tablet 100 mg  100 mg Oral QHS PRN Sharma Covert, MD        Lab Results: No results found for this or any previous visit (from the past 106 hour(s)).  Blood Alcohol level:  Lab Results  Component Value Date   ETH <10 10/18/2019   ETH <10 14/43/1540    Metabolic Disorder Labs: Lab Results  Component Value Date   HGBA1C 5.0 09/08/2019   MPG 96.8 09/08/2019   Lab Results  Component Value Date   PROLACTIN 26.5 (H) 09/08/2019   Lab Results  Component Value Date   CHOL 159 09/08/2019   TRIG 68 09/08/2019   HDL 53 09/08/2019   CHOLHDL 3.0 09/08/2019   VLDL 14 09/08/2019   LDLCALC 92 09/08/2019    Physical Findings: AIMS: Facial and Oral Movements Muscles of Facial Expression: None, normal Lips and Perioral Area: None, normal Jaw: None, normal Tongue: None, normal,Extremity Movements Upper (arms, wrists, hands, fingers): None, normal Lower (legs, knees, ankles, toes): None, normal, Trunk Movements Neck, shoulders, hips: None, normal, Overall Severity Severity of abnormal movements (highest score from questions above): None, normal Incapacitation due to abnormal movements: None, normal Patient's awareness of abnormal  movements (rate only patient's report): No Awareness, Dental Status Current problems with teeth and/or dentures?: No Does patient usually wear dentures?: No  CIWA:    COWS:     Musculoskeletal: Strength & Muscle Tone: within normal limits Gait & Station: normal Patient leans: N/A  Psychiatric Specialty Exam: Physical Exam Vitals and nursing note reviewed.  HENT:     Head: Normocephalic and atraumatic.  Pulmonary:     Effort: Pulmonary effort is normal.  Neurological:     General: No  focal deficit present.     Mental Status: He is alert and oriented to person, place, and time.     Review of Systems  Blood pressure 100/65, pulse 92, temperature 97.7 F (36.5 C), temperature source Oral, resp. rate 18, height 5\' 7"  (1.702 m), weight 56.7 kg, SpO2 100 %.Body mass index is 19.58 kg/m.  General Appearance: Disheveled  Eye Contact:  Minimal  Speech:  Normal Rate  Volume:  Increased  Mood:  Dysphoric and Irritable  Affect:  Congruent  Thought Process:  Coherent and Descriptions of Associations: Circumstantial  Orientation:  Full (Time, Place, and Person)  Thought Content:  Delusions and Paranoid Ideation  Suicidal Thoughts:  No  Homicidal Thoughts:  No  Memory:  Immediate;   Fair Recent;   Fair Remote;   Fair  Judgement:  Impaired  Insight:  Lacking  Psychomotor Activity:  Normal  Concentration:  Concentration: Fair and Attention Span: Fair  Recall:  of Knowledge:  Fair  Language:  Fair  Akathisia:  Negative  Handed:  Right  AIMS (if indicated):     Assets:  Desire for Improvement Housing Resilience  ADL's:  Intact  Cognition:  WNL  Sleep:  Number of Hours: 6     Treatment Plan Summary: Daily contact with patient to assess and evaluate symptoms and progress in treatment, Medication management and Plan : Patient is seen and examined.  Patient is a 24 year old male with the above-stated past psychiatric history who is seen in follow-up.  Diagnosis: 1.   Schizophrenia versus schizoaffective disorder 2.  Cannabis use disorder  Pertinent findings on examination today: 1.  Continued irritability and lack of insight. 2.  Patient did accept his medications last night.  Plan: 1.  Change Risperdal to 3 mg p.o. nightly for DC daytime dosage.  This is done in an attempt to decrease his sense of sedation during the day.  I do not think you will matter, but I am attempting to improve his compliance. 2.  Stop Tegretol.  He will most likely stop this right after discharge. 3.  Continue to attempt to convince to take the long-acting Risperdal injection. 4.  Continue Haldol 10 mg p.o. or IM twice daily if refuses oral medications. 5.  Disposition planning-in progress. 30, MD 10/23/2019, 11:54 AM

## 2019-10-23 NOTE — [facility] Group Notes (Signed)
[facility] Group Notes: (Clinical Social Work)   10/23/2019      Type of Therapy:  Group Therapy   Participation Level:  Did Not Attend - was invited individually by Nurse/MHT and chose not to attend.   Leisa Lenz, LCSW 10/23/2019  12:22 PM

## 2019-10-23 NOTE — [facility] Group Notes (Signed)
Adult Psychoeducational Group Note  Date:  10/23/2019 Time:  11:56 AM  Group Topic/Focus:  Goals Group:   The focus of this group is to help patients establish daily goals to achieve during treatment and discuss how the patient can incorporate goal setting into their daily lives to aide in recovery.  Participation Level:  Did Not Attend    [name] 10/23/2019, 11:56 AM

## 2019-10-23 NOTE — Progress Notes (Signed)
   10/22/19 2200  Psych Admission Type (Psych Patients Only)  Admission Status Involuntary  Psychosocial Assessment  Patient Complaints None  Eye Contact Fair  Facial Expression Animated;Anxious;Pensive  Affect Anxious  Speech Argumentative;Logical/coherent  Interaction Assertive;Avoidant;Childlike  Motor Activity Fidgety;Restless  Appearance/Hygiene In scrubs  Behavior Characteristics Cooperative  Mood Anxious  Thought Process  Coherency Concrete thinking  Content Paranoia  Delusions Paranoid  Perception WDL  Hallucination None reported or observed  Judgment Limited  Confusion None  Danger to Self  Current suicidal ideation? Denies  Danger to Others  Danger to Others None reported or observed

## 2019-10-23 NOTE — Progress Notes (Signed)
   10/22/19 2120  COVID-19 Daily Checkoff  Have you had a fever (temp > 37.80C/100F)  in the past 24 hours?  No  If you have had runny nose, nasal congestion, sneezing in the past 24 hours, has it worsened? No  COVID-19 EXPOSURE  Have you traveled outside the state in the past 14 days? No  Have you been in contact with someone with a confirmed diagnosis of COVID-19 or PUI in the past 14 days without wearing appropriate PPE? No  Have you been living in the same home as a person with confirmed diagnosis of COVID-19 or a PUI (household contact)? No

## 2019-10-23 NOTE — Plan of Care (Signed)
  Problem: Education: Goal: Knowledge of [facility] Education information/materials will improve Outcome: Progressing Goal: Emotional status will improve Outcome: Progressing Goal: Mental status will improve Outcome: Progressing Goal: Verbalization of understanding the information provided will improve Outcome: Progressing   Problem: Coping: Goal: Ability to identify and develop effective coping behavior will improve Outcome: Progressing   Problem: Health Behavior/Discharge Planning: Goal: Compliance with prescribed medication regimen will improve Outcome: Progressing   Problem: Nutritional: Goal: Ability to achieve adequate nutritional intake will improve Outcome: Progressing   Problem: Role Relationship: Goal: Ability to communicate needs accurately will improve Outcome: Progressing Goal: Ability to interact with others will improve Outcome: Progressing   Problem: Safety: Goal: Ability to redirect hostility and anger into socially appropriate behaviors will improve Outcome: Progressing Goal: Ability to remain free from injury will improve Outcome: Progressing   Problem: Self-Care: Goal: Ability to participate in self-care as condition permits will improve Outcome: Progressing  Patient denies SI, HI and AVH.  Patient has been more cooperative with treatment, compliant with medications and needing less redirection.  Patient engaged in groups and unit activities without incident this shift.  Patient has had no behavioral dyscontrol and required minimal redirection. Continue to monitor as planned.

## 2019-10-24 MED ORDER — RISPERIDONE MICROSPHERES ER 25 MG IM SRER
25.0000 mg | INTRAMUSCULAR | Status: DC
  Administered 2019-10-24: 25 mg via INTRAMUSCULAR
  Filled 2019-10-24: qty 2

## 2019-10-24 NOTE — [facility] Group Notes (Signed)
[facility] LCSW Group Therapy Note  Date/Time:  10/24/2019 11:15-12:00PM  Type of Therapy and Topic:  Group Therapy:  Healthy and Unhealthy Supports  Participation Level:  Active   Description of Group:  Patients in this group were introduced to the idea of adding a variety of healthy supports to address the various needs in their lives.Patients discussed what additional healthy supports could be helpful in their recovery and wellness after discharge in order to prevent future hospitalizations.   An emphasis was placed on using counselor, doctor, therapy groups, 12-step groups, and problem-specific support groups to expand supports.  They also worked as a group on developing a specific plan for several patients to deal with unhealthy supports through boundary-setting, psychoeducation with loved ones, and even termination of relationships.   Therapeutic Goals:   1)  discuss importance of adding supports to stay well once out of the hospital  2)  compare healthy versus unhealthy supports and identify some examples of each  3)  generate ideas and descriptions of healthy supports that can be added  4)  offer mutual support about how to address unhealthy supports  5)  encourage active participation in and adherence to discharge plan    Summary of Patient Progress:  The patient willingly participated in introductory check-in, sharing of feeling "6/10, I'm feeling good, I'm on my medication and I'm feeling uplifted". Pt openly shared his understanding of support to be "help", further engaging in the exploration and discussion of other group members definitions. Pt openly detailed his beliefs on the importance of adding supports, sharing reasoning to be "People are going to need help" and proved receptive to other group members reasons. Pt identified trustworthiness to be characteristics he expects of healthy supports. Pt engaged in discussion surrounding where such supports can be found, detailing  recreational teams, book clubs, libraries and other community locations. Pt identified means of addressing unhealthy supports by "reporting them", specifically referencing professional supports. Pt proved receptive to other participating group members input and feedback from CSW.   Therapeutic Modalities:   Motivational Interviewing Brief Solution-Focused Therapy  Leisa Lenz, LCSW 10/24/2019  12:55 PM

## 2019-10-24 NOTE — Progress Notes (Signed)
Patient has been up in the dayroom watching tv and interacting with Summer. He reports having had a good day other than feeling that his incident with Jonny Ruiz. Writer explained to him that some patient are sicker that others on this hall. Writer explained that some patients are very paranoid and may feel that others are laughing or talking about them. He was more understanding of why Jonny Ruiz may have acted that way he did this afternoon. He was compliant with his medications and is hopeful to discharge on Monday. Safety maintained on unit with 15 min checks.

## 2019-10-24 NOTE — Progress Notes (Signed)
   10/23/19 2130  COVID-19 Daily Checkoff  Have you had a fever (temp > 37.80C/100F)  in the past 24 hours?  No  If you have had runny nose, nasal congestion, sneezing in the past 24 hours, has it worsened? No  COVID-19 EXPOSURE  Have you traveled outside the state in the past 14 days? No  Have you been in contact with someone with a confirmed diagnosis of COVID-19 or PUI in the past 14 days without wearing appropriate PPE? No  Have you been living in the same home as a person with confirmed diagnosis of COVID-19 or a PUI (household contact)? No  Have you been diagnosed with COVID-19? No

## 2019-10-24 NOTE — Progress Notes (Signed)
Progress note  Other pt's complained that this pt was threatening to "fight" other pt's on the hallway and was expressing aggressive behavior.

## 2019-10-24 NOTE — Plan of Care (Signed)
Progress note  D: pt found in bed; allowed to rest. Upon awakening, pt compliant with medication administration. Pt continues to be isolative after medication pass. Pt also continues to be guarded in their assessment. Pt has been without incident today. Pt seen in the dayroom interacting with peers. Pt denies si/hi/ah/vh and verbally agrees to approach staff if these become apparent or before harming themself/others while at bhh.  A: Pt provided support and encouragement. Pt given medication per protocol and standing orders. Q70m safety checks implemented and continued.  R: Pt safe on the unit. Will continue to monitor.  Pt progressing in the following metrics  Problem: Health Behavior/Discharge Planning: Goal: Compliance with prescribed medication regimen will improve Outcome: Progressing   Problem: Role Relationship: Goal: Ability to communicate needs accurately will improve Outcome: Progressing   Problem: Safety: Goal: Ability to remain free from injury will improve Outcome: Progressing   Problem: Self-Care: Goal: Ability to participate in self-care as condition permits will improve Outcome: Progressing

## 2019-10-24 NOTE — Progress Notes (Signed)
[facility] MD Progress Note  10/24/2019 10:25 AM ACEY WOODFIELD  MRN:  [id number] Subjective:  Patient is a 24 year old male with a reported past psychiatric history significant for schizophrenia who presented to the [facility] emergency department under involuntary commitment by his father on 10/19/2019. He apparently had been noncompliant with medications, and become physically aggressive and violent towards his father.  Objective: Patient is seen and examined.  Patient is a 24 year old male with the above-stated past psychiatric history who is seen in follow-up.  He actually looks a little bit better today.  He is more pleasant and less irritable.  He stated he is not having as much daytime sedation with the changes in his medications.  He denied auditory or visual hallucinations.  He denied suicidal or homicidal ideation.  We discussed the fact that we need to contact his family if that is where he is can return home to.  He is not really happy about that.  His vital signs are stable, he is afebrile.  He slept 7.5 hours last night.  No new laboratories.  Principal Problem: <principal problem not specified> Diagnosis: Active Problems:   Schizophrenia (HCC)   Psychosis (HCC)  Total Time spent with patient: 20 minutes  Past Psychiatric History: See admission H&P  Past Medical History:  Past Medical History:  Diagnosis Date  . Psychosis [facility]) 05/2019   [facility]   . Seasonal allergies     Past Surgical History:  Procedure Laterality Date  . TONSILLECTOMY     Family History:  Family History  Problem Relation Age of Onset  . Hypertension Mother    Family Psychiatric  History: See admission H&P Social History:  Social History   Substance and Sexual Activity  Alcohol Use Not Currently   Comment: rare     Social History   Substance and Sexual Activity  Drug Use No    Social History   Socioeconomic History  . Marital status: Single    Spouse name: Not on file  .  Number of children: Not on file  . Years of education: Not on file  . Highest education level: Not on file  Occupational History  . Not on file  Tobacco Use  . Smoking status: Current Every Day Smoker    Types: Cigarettes  . Smokeless tobacco: Former Engineer, water and Sexual Activity  . Alcohol use: Not Currently    Comment: rare  . Drug use: No  . Sexual activity: Not on file  Other Topics Concern  . Not on file  Social History Narrative  . Not on file   Social Determinants of Health   Financial Resource Strain:   . Difficulty of Paying Living Expenses:   Food Insecurity:   . Worried About Programme researcher, broadcasting/film/video in the Last Year:   . Barista in the Last Year:   Transportation Needs:   . Freight forwarder (Medical):   Marland Kitchen Lack of Transportation (Non-Medical):   Physical Activity:   . Days of Exercise per Week:   . Minutes of Exercise per Session:   Stress:   . Feeling of Stress :   Social Connections:   . Frequency of Communication with Friends and Family:   . Frequency of Social Gatherings with Friends and Family:   . Attends Religious Services:   . Active Member of Clubs or Organizations:   . Attends Banker Meetings:   Marland Kitchen Marital Status:    Additional  Social History:                         Sleep: Good  Appetite:  Good  Current Medications: Current Facility-Administered Medications  Medication Dose Route Frequency Provider Last Rate Last Admin  . acetaminophen (TYLENOL) tablet 650 mg  650 mg Oral Q6H PRN Denzil Magnuson, NP      . alum & mag hydroxide-simeth (MAALOX/MYLANTA) 200-200-20 MG/5ML suspension 30 mL  30 mL Oral Q4H PRN Denzil Magnuson, NP      . carbamazepine (TEGRETOL) tablet 200 mg  200 mg Oral BID Antonieta Pert, MD   200 mg at 10/23/19 1638  . feeding supplement (ENSURE ENLIVE) (ENSURE ENLIVE) liquid 237 mL  237 mL Oral BID BM Antonieta Pert, MD   237 mL at 10/23/19 1508  . haloperidol (HALDOL) tablet  10 mg  10 mg Oral BID Antonieta Pert, MD   10 mg at 10/23/19 1638   Or  . haloperidol lactate (HALDOL) injection 10 mg  10 mg Intramuscular BID Antonieta Pert, MD      . hydrOXYzine (ATARAX/VISTARIL) tablet 25 mg  25 mg Oral TID PRN Antonieta Pert, MD      . OLANZapine zydis (ZYPREXA) disintegrating tablet 10 mg  10 mg Oral Q8H PRN Antonieta Pert, MD       And  . LORazepam (ATIVAN) tablet 1 mg  1 mg Oral PRN Antonieta Pert, MD       And  . ziprasidone (GEODON) injection 20 mg  20 mg Intramuscular PRN Antonieta Pert, MD      . magnesium hydroxide (MILK OF MAGNESIA) suspension 30 mL  30 mL Oral Daily PRN Antonieta Pert, MD      . nicotine polacrilex (NICORETTE) gum 2 mg  2 mg Oral PRN Anike, Adaku C, NP   2 mg at 10/23/19 1639  . risperiDONE (RISPERDAL M-TABS) disintegrating tablet 3 mg  3 mg Oral QHS Antonieta Pert, MD   3 mg at 10/23/19 2109  . traZODone (DESYREL) tablet 100 mg  100 mg Oral QHS PRN Antonieta Pert, MD        Lab Results: No results found for this or any previous visit (from the past 48 hour(s)).  Blood Alcohol level:  Lab Results  Component Value Date   ETH <10 10/18/2019   ETH <10 09/05/2019    Metabolic Disorder Labs: Lab Results  Component Value Date   HGBA1C 5.0 09/08/2019   MPG 96.8 09/08/2019   Lab Results  Component Value Date   PROLACTIN 26.5 (H) 09/08/2019   Lab Results  Component Value Date   CHOL 159 09/08/2019   TRIG 68 09/08/2019   HDL 53 09/08/2019   CHOLHDL 3.0 09/08/2019   VLDL 14 09/08/2019   LDLCALC 92 09/08/2019    Physical Findings: AIMS: Facial and Oral Movements Muscles of Facial Expression: None, normal Lips and Perioral Area: None, normal Jaw: None, normal Tongue: None, normal,Extremity Movements Upper (arms, wrists, hands, fingers): None, normal Lower (legs, knees, ankles, toes): None, normal, Trunk Movements Neck, shoulders, hips: None, normal, Overall Severity Severity of abnormal  movements (highest score from questions above): None, normal Incapacitation due to abnormal movements: None, normal Patient's awareness of abnormal movements (rate only patient's report): No Awareness, Dental Status Current problems with teeth and/or dentures?: No Does patient usually wear dentures?: No  CIWA:    COWS:     Musculoskeletal: Strength &  Muscle Tone: within normal limits Gait & Station: normal Patient leans: N/A  Psychiatric Specialty Exam: Physical Exam Vitals and nursing note reviewed.  Constitutional:      Appearance: Normal appearance.  HENT:     Head: Normocephalic and atraumatic.  Pulmonary:     Effort: Pulmonary effort is normal.  Neurological:     General: No focal deficit present.     Mental Status: He is alert and oriented to person, place, and time.     Review of Systems  Blood pressure 115/77, pulse 88, temperature 97.8 F (36.6 C), temperature source Oral, resp. rate 18, height 5\' 7"  (1.702 m), weight 56.7 kg, SpO2 100 %.Body mass index is 19.58 kg/m.  General Appearance: Disheveled  Eye Contact:  Fair  Speech:  Normal Rate  Volume:  Normal  Mood:  Anxious  Affect:  Congruent  Thought Process:  Coherent and Descriptions of Associations: Circumstantial  Orientation:  Full (Time, Place, and Person)  Thought Content:  Logical  Suicidal Thoughts:  No  Homicidal Thoughts:  No  Memory:  Immediate;   Fair Recent;   Fair Remote;   Fair  Judgement:  Intact  Insight:  Lacking  Psychomotor Activity:  Normal  Concentration:  Concentration: Fair and Attention Span: Fair  Recall:  [employer] of Knowledge:  Fair  Language:  Good  Akathisia:  Negative  Handed:  Right  AIMS (if indicated):     Assets:  Desire for Improvement Housing Resilience  ADL's:  Intact  Cognition:  WNL  Sleep:  Number of Hours: 7.5     Treatment Plan Summary: Daily contact with patient to assess and evaluate symptoms and progress in treatment, Medication management and  Plan : Patient is seen and examined.  Patient is a 24 year old male with the above-stated past psychiatric history who is seen in follow-up.   Diagnosis: 1. Schizophrenia versus schizoaffective disorder 2. Cannabis use disorder  Pertinent findings on examination today: 1.  Decreased irritability 2.  Denies psychotic symptoms. 3.  Compliant with medications. 4.  Denies suicidal or homicidal ideation.  Plan: 1.  Continue Risperdal 3 mg p.o. nightly for mood stability and psychosis. 2.  Continue Tegretol 200 mg p.o. twice daily for mood stability and irritability. 3.  Order Tegretol level, liver function enzymes and CBC in a.m. tomorrow. 4.  Continue to attempt to get patient to agree to the long-acting Risperdal injection. 5.  Disposition planning-in progress.  Sharma Covert, MD 10/24/2019, 10:25 AM

## 2019-10-25 LAB — HEPATIC FUNCTION PANEL
ALT: 17 U/L (ref 0–44)
AST: 20 U/L (ref 15–41)
Albumin: 4.4 g/dL (ref 3.5–5.0)
Alkaline Phosphatase: 76 U/L (ref 38–126)
Bilirubin, Direct: 0.1 mg/dL (ref 0.0–0.2)
Indirect Bilirubin: 0.4 mg/dL (ref 0.3–0.9)
Total Bilirubin: 0.5 mg/dL (ref 0.3–1.2)
Total Protein: 7.3 g/dL (ref 6.5–8.1)

## 2019-10-25 LAB — CBC WITH DIFFERENTIAL/PLATELET
Abs Immature Granulocytes: 0.02 10*3/uL (ref 0.00–0.07)
Basophils Absolute: 0 10*3/uL (ref 0.0–0.1)
Basophils Relative: 1 %
Eosinophils Absolute: 0.2 10*3/uL (ref 0.0–0.5)
Eosinophils Relative: 4 %
HCT: 47.1 % (ref 39.0–52.0)
Hemoglobin: 15.8 g/dL (ref 13.0–17.0)
Immature Granulocytes: 1 %
Lymphocytes Relative: 43 %
Lymphs Abs: 1.9 10*3/uL (ref 0.7–4.0)
MCH: 29.1 pg (ref 26.0–34.0)
MCHC: 33.5 g/dL (ref 30.0–36.0)
MCV: 86.7 fL (ref 80.0–100.0)
Monocytes Absolute: 0.5 10*3/uL (ref 0.1–1.0)
Monocytes Relative: 12 %
Neutro Abs: 1.7 10*3/uL (ref 1.7–7.7)
Neutrophils Relative %: 39 %
Platelets: 184 10*3/uL (ref 150–400)
RBC: 5.43 MIL/uL (ref 4.22–5.81)
RDW: 12.8 % (ref 11.5–15.5)
WBC: 4.3 10*3/uL (ref 4.0–10.5)
nRBC: 0 % (ref 0.0–0.2)

## 2019-10-25 LAB — CARBAMAZEPINE LEVEL, TOTAL: Carbamazepine Lvl: 7.7 ug/mL (ref 4.0–12.0)

## 2019-10-25 MED ORDER — RISPERIDONE 3 MG PO TBDP
3.0000 mg | ORAL_TABLET | Freq: Every day | ORAL | Status: DC
  Filled 2019-10-25: qty 7

## 2019-10-25 MED ORDER — HALOPERIDOL 10 MG PO TABS: 10.0000 mg | ORAL_TABLET | Freq: Two times a day (BID) | ORAL | 0 refills | Status: DC

## 2019-10-25 MED ORDER — CARBAMAZEPINE 200 MG PO TABS: 200.0000 mg | ORAL_TABLET | Freq: Two times a day (BID) | ORAL | 0 refills | Status: DC

## 2019-10-25 MED ORDER — RISPERIDONE MICROSPHERES ER 25 MG IM SRER: 25.0000 mg | INTRAMUSCULAR | 0 refills | Status: DC

## 2019-10-25 MED ORDER — RISPERIDONE 3 MG PO TBDP: 3.0000 mg | ORAL_TABLET | Freq: Every day | ORAL | 0 refills | Status: DC

## 2019-10-25 MED ORDER — NICOTINE POLACRILEX 2 MG MT GUM: 2.0000 mg | CHEWING_GUM | OROMUCOSAL | 0 refills | Status: DC | PRN

## 2019-10-25 NOTE — Progress Notes (Signed)
Pt discharged to lobby. Pt was stable and appreciative at that time. All papers, samples and prescriptions were given and valuables returned. Verbal understanding expressed. Denies SI/HI and A/VH. Pt given opportunity to express concerns and ask questions.  

## 2019-10-25 NOTE — B[facility] Group Notes (Signed)
The focus of this group is to help patients establish daily goals to achieve during treatment and discuss how the patient can incorporate goal setting into their daily lives to aide in recovery.  Pt did not attend group 

## 2019-10-25 NOTE — Progress Notes (Signed)
Patient has been up in the dayroom watching tv, minimal to no interaction with other patients. He is hopeful to discharge on tomorrow. Safety maintained on unit with 15 min checks.

## 2019-10-25 NOTE — Tx Team (Signed)
clarInterdisciplinary Treatment and Diagnostic Plan Update  10/25/2019 Time of Session: 9:15am Brendan Hogan MRN: [id number]  Principal Diagnosis: <principal problem not specified>  Secondary Diagnoses: Active Problems:   Schizophrenia ([place])   Psychosis ([place])   Current Medications:  Current Facility-Administered Medications  Medication Dose Route Frequency Provider Last Rate Last Admin  . acetaminophen (TYLENOL) tablet 650 mg  650 mg Oral Q6H PRN Mordecai Maes, NP      . alum & mag hydroxide-simeth (MAALOX/MYLANTA) 200-200-20 MG/5ML suspension 30 mL  30 mL Oral Q4H PRN Mordecai Maes, NP      . carbamazepine (TEGRETOL) tablet 200 mg  200 mg Oral BID Sharma Covert, MD   200 mg at 10/25/19 0827  . feeding supplement (ENSURE ENLIVE) (ENSURE ENLIVE) liquid 237 mL  237 mL Oral BID BM Sharma Covert, MD   237 mL at 10/24/19 1507  . haloperidol (HALDOL) tablet 10 mg  10 mg Oral BID Sharma Covert, MD   10 mg at 10/25/19 0827   Or  . haloperidol lactate (HALDOL) injection 10 mg  10 mg Intramuscular BID Sharma Covert, MD      . hydrOXYzine (ATARAX/VISTARIL) tablet 25 mg  25 mg Oral TID PRN Sharma Covert, MD      . OLANZapine zydis (ZYPREXA) disintegrating tablet 10 mg  10 mg Oral Q8H PRN Sharma Covert, MD       And  . LORazepam (ATIVAN) tablet 1 mg  1 mg Oral PRN Sharma Covert, MD       And  . ziprasidone (GEODON) injection 20 mg  20 mg Intramuscular PRN Sharma Covert, MD      . magnesium hydroxide (MILK OF MAGNESIA) suspension 30 mL  30 mL Oral Daily PRN Sharma Covert, MD      . nicotine polacrilex (NICORETTE) gum 2 mg  2 mg Oral PRN Anike, Adaku C, NP   2 mg at 10/24/19 1917  . risperiDONE (RISPERDAL M-TABS) disintegrating tablet 3 mg  3 mg Oral QHS Sharma Covert, MD   3 mg at 10/24/19 2100  . risperiDONE microspheres (RISPERDAL CONSTA) injection 25 mg  25 mg Intramuscular Q14 Days Sharma Covert, MD   25 mg at 10/24/19 1501  .  traZODone (DESYREL) tablet 100 mg  100 mg Oral QHS PRN Sharma Covert, MD   100 mg at 10/24/19 2100   PTA Medications: Medications Prior to Admission  Medication Sig Dispense Refill Last Dose  . OLANZapine zydis (ZYPREXA) 5 MG disintegrating tablet Take 1 tablet (5 mg total) by mouth daily. (Patient not taking: Reported on 10/19/2019) 30 tablet 1   . OLANZapine zydis (ZYPREXA) 5 MG disintegrating tablet Take 1.5 tablets (7.5 mg total) by mouth at bedtime. (Patient not taking: Reported on 10/19/2019) 45 tablet 1     Patient Stressors: Financial difficulties Health problems Medication change or noncompliance  Patient Strengths: Average or above average intelligence Supportive family/friends  Treatment Modalities: Medication Management, Group therapy, Case management,  1 to 1 session with clinician, Psychoeducation, Recreational therapy.   Physician Treatment Plan for Primary Diagnosis: <principal problem not specified> Long Term Goal(s): Improvement in symptoms so as ready for discharge Improvement in symptoms so as ready for discharge   Short Term Goals: Ability to identify changes in lifestyle to reduce recurrence of condition will improve Ability to verbalize feelings will improve Ability to demonstrate self-control will improve Ability to identify and develop effective coping behaviors will improve Ability to maintain  clinical measurements within normal limits will improve Compliance with prescribed medications will improve Ability to identify triggers associated with substance abuse/mental health issues will improve Ability to identify changes in lifestyle to reduce recurrence of condition will improve Ability to verbalize feelings will improve Ability to demonstrate self-control will improve Ability to identify and develop effective coping behaviors will improve Ability to maintain clinical measurements within normal limits will improve Compliance with prescribed medications  will improve Ability to identify triggers associated with substance abuse/mental health issues will improve  Medication Management: Evaluate patient's response, side effects, and tolerance of medication regimen.  Therapeutic Interventions: 1 to 1 sessions, Unit Group sessions and Medication administration.  Evaluation of Outcomes: Adequate for Discharge  Physician Treatment Plan for Secondary Diagnosis: Active Problems:   Schizophrenia (HCC)   Psychosis (HCC)  Long Term Goal(s): Improvement in symptoms so as ready for discharge Improvement in symptoms so as ready for discharge   Short Term Goals: Ability to identify changes in lifestyle to reduce recurrence of condition will improve Ability to verbalize feelings will improve Ability to demonstrate self-control will improve Ability to identify and develop effective coping behaviors will improve Ability to maintain clinical measurements within normal limits will improve Compliance with prescribed medications will improve Ability to identify triggers associated with substance abuse/mental health issues will improve Ability to identify changes in lifestyle to reduce recurrence of condition will improve Ability to verbalize feelings will improve Ability to demonstrate self-control will improve Ability to identify and develop effective coping behaviors will improve Ability to maintain clinical measurements within normal limits will improve Compliance with prescribed medications will improve Ability to identify triggers associated with substance abuse/mental health issues will improve     Medication Management: Evaluate patient's response, side effects, and tolerance of medication regimen.  Therapeutic Interventions: 1 to 1 sessions, Unit Group sessions and Medication administration.  Evaluation of Outcomes: Adequate for Discharge   RN Treatment Plan for Primary Diagnosis: <principal problem not specified> Long Term Goal(s): Knowledge  of disease and therapeutic regimen to maintain health will improve  Short Term Goals: Ability to remain free from injury will improve, Ability to verbalize frustration and anger appropriately will improve, Ability to identify and develop effective coping behaviors will improve and Compliance with prescribed medications will improve  Medication Management: RN will administer medications as ordered by provider, will assess and evaluate patient's response and provide education to patient for prescribed medication. RN will report any adverse and/or side effects to prescribing provider.  Therapeutic Interventions: 1 on 1 counseling sessions, Psychoeducation, Medication administration, Evaluate responses to treatment, Monitor vital signs and CBGs as ordered, Perform/monitor CIWA, COWS, AIMS and Fall Risk screenings as ordered, Perform wound care treatments as ordered.  Evaluation of Outcomes: Adequate for Discharge   LCSW Treatment Plan for Primary Diagnosis: <principal problem not specified> Long Term Goal(s): Safe transition to appropriate next level of care at discharge, Engage patient in therapeutic group addressing interpersonal concerns.  Short Term Goals: Engage patient in aftercare planning with referrals and resources, Increase social support, Increase emotional regulation, Identify triggers associated with mental health/substance abuse issues and Increase skills for wellness and recovery  Therapeutic Interventions: Assess for all discharge needs, 1 to 1 time with Social worker, Explore available resources and support systems, Assess for adequacy in community support network, Educate family and significant other(s) on suicide prevention, Complete Psychosocial Assessment, Interpersonal group therapy.  Evaluation of Outcomes: Adequate for Discharge   Progress in Treatment: Attending groups: Yes. Participating in groups: Yes.  Taking medication as prescribed: Yes. Toleration medication:  Yes. Family/Significant other contact made: No, will contact:  if consent is given. Patient understands diagnosis: Yes. Discussing patient identified problems/goals with staff: Yes. Medical problems stabilized or resolved: Yes. Denies suicidal/homicidal ideation: Yes. Issues/concerns per patient self-inventory: No. Other: none  New problem(s) identified: No, Describe:  CSW will continue to assess.  New Short Term/Long Term Goal(s): medication stabilization, elimination of SI thoughts, development of comprehensive mental wellness plan.   Patient Goals:    Discharge Plan or Barriers: Declined referrals. Provided walk in information for the [facility]  Reason for Continuation of Hospitalization: Delusions   Estimated Length of Stay: discharging today   Attendees: Patient: 10/20/2019   Physician:  10/20/2019   Nursing:  10/20/2019   RN Care Manager: 10/20/2019   Social Worker: Ruthann Cancer, LCSWA 10/20/2019   Recreational Therapist:  10/20/2019   Other:  10/20/2019   Other:  10/20/2019   Other: 10/20/2019     Scribe for Treatment Team: Darreld Mclean, LCSWAA 10/25/2019 11:30 AM

## 2019-10-25 NOTE — Progress Notes (Signed)
  [facility] Adult Case Management Discharge Plan :  Will you be returning to the same living situation after discharge:  Yes,  to parents home. At discharge, do you have transportation home?: Yes,  parents to pick up. Do you have the ability to pay for your medications: No. Samples to be provided at discharge.   Release of information consent forms completed and in the chart;  Patient's signature needed at discharge.  Patient to Follow up at:  Follow-up Information    [facility]. Go to.   Specialty: Behavioral Health Why: Please visit this provider during their walk in hours of 2:00 pm to 5:00 pm, Monday through Friday for medication management and therapy services.  Contact information: [address] [place] [address] [phone]              Next level of care provider has access to [facility] Link:no  Safety Planning and Suicide Prevention discussed: Yes,  with patient.     Has patient been referred to the Quitline?: N/A patient is not a smoker  Patient has been referred for addiction treatment: N/A  Otelia Santee, LCSWA 10/25/2019, 9:25 AM

## 2019-10-25 NOTE — Progress Notes (Signed)
   10/24/19 2100  COVID-19 Daily Checkoff  Have you had a fever (temp > 37.80C/100F)  in the past 24 hours?  No  If you have had runny nose, nasal congestion, sneezing in the past 24 hours, has it worsened? No  COVID-19 EXPOSURE  Have you traveled outside the state in the past 14 days? No  Have you been in contact with someone with a confirmed diagnosis of COVID-19 or PUI in the past 14 days without wearing appropriate PPE? No  Have you been diagnosed with COVID-19? No

## 2019-10-25 NOTE — BHH Suicide Risk Assessment (Signed)
[facility] Discharge Suicide Risk Assessment   Principal Problem: <principal problem not specified> Discharge Diagnoses: Active Problems:   Schizophrenia (HCC)   Psychosis (HCC)   Total Time spent with patient: 15 minutes  Musculoskeletal: Strength & Muscle Tone: within normal limits Gait & Station: normal Patient leans: N/A  Psychiatric Specialty Exam: Review of Systems  All other systems reviewed and are negative.   Blood pressure 99/61, pulse (!) 102, temperature 98.4 F (36.9 C), temperature source Oral, resp. rate 18, height 5\' 7"  (1.702 m), weight 56.7 kg, SpO2 100 %.Body mass index is 19.58 kg/m.  General Appearance: Casual  Eye Contact::  Fair  Speech:  Normal Rate409  Volume:  Normal  Mood:  Anxious  Affect:  Congruent  Thought Process:  Coherent and Descriptions of Associations: Intact  Orientation:  Full (Time, Place, and Person)  Thought Content:  Logical  Suicidal Thoughts:  No  Homicidal Thoughts:  No  Memory:  Immediate;   Fair Recent;   Fair Remote;   Fair  Judgement:  Intact  Insight:  Fair  Psychomotor Activity:  Normal  Concentration:  Fair  Recall:  [id number] of Knowledge:Good  Language: Good  Akathisia:  Negative  Handed:  Right  AIMS (if indicated):     Assets:  Desire for Improvement Housing Resilience  Sleep:  Number of Hours: 6.75  Cognition: WNL  ADL's:  Intact   Mental Status Per Nursing Assessment::   On Admission:  NA  Demographic Factors:  Male, Caucasian and Unemployed  Loss Factors: NA  Historical Factors: Impulsivity  Risk Reduction Factors:   Living with another person, especially a relative and Positive social support  Continued Clinical Symptoms:  Schizophrenia:   Less than 30 years old Paranoid or undifferentiated type  Cognitive Features That Contribute To Risk:  Closed-mindedness    Suicide Risk:  Minimal: No identifiable suicidal ideation.  Patients presenting with no risk factors but with morbid  ruminations; may be classified as minimal risk based on the severity of the depressive symptoms   Follow-up Information    [facility]. Go to.   Specialty: Behavioral Health Why: A referral has been made to this provider on your behalf.  Please contact to obtain an appointment or visit during their walk in hours of 2:00 pm to 5:00 pm, Monday through Friday for medication management and therapy services.  Contact information: [phone] [place] [phone]              Plan Of Care/Follow-up recommendations:  Activity:  ad lib  [phone], MD 10/25/2019, 11:27 AM

## 2019-10-25 NOTE — Progress Notes (Signed)
   10/24/19 2100  Psych Admission Type (Psych Patients Only)  Admission Status Involuntary  Psychosocial Assessment  Patient Complaints None  Eye Contact Fair  Facial Expression Animated  Affect Labile  Speech Logical/coherent  Interaction Attention-seeking;Childlike;Forwards little  Motor Activity Fidgety;Restless  Appearance/Hygiene Unremarkable  Behavior Characteristics Appropriate to situation;Cooperative  Mood Preoccupied  Aggressive Behavior  Targets Self  Type of Behavior Verbal;Threatening  Effect No apparent injury  Thought Process  Coherency Concrete thinking  Content Blaming others  Delusions Controlled;Persecutory  Perception WDL  Hallucination None reported or observed  Judgment Limited  Confusion None  Danger to Self  Current suicidal ideation? Denies  Danger to Others  Danger to Others None reported or observed
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
## 2019-11-01 ENCOUNTER — Emergency Department (HOSPITAL_COMMUNITY)
Admission: EM | Admit: 2019-11-01 | Discharge: 2019-11-03 | Disposition: A | Discharge status: Other Acute Inpatient Hospital | Payer: Self-pay | Attending: Emergency Medicine | Admitting: Emergency Medicine

## 2019-11-01 DIAGNOSIS — F29 Unspecified psychosis not due to a substance or known physiological condition: Secondary | ICD-10-CM

## 2019-11-01 DIAGNOSIS — F17210 Nicotine dependence, cigarettes, uncomplicated: Secondary | ICD-10-CM | POA: Insufficient documentation

## 2019-11-01 DIAGNOSIS — Z20822 Contact with and (suspected) exposure to covid-19: Secondary | ICD-10-CM | POA: Insufficient documentation

## 2019-11-01 DIAGNOSIS — Z79899 Other long term (current) drug therapy: Secondary | ICD-10-CM | POA: Insufficient documentation

## 2019-11-01 DIAGNOSIS — F250 Schizoaffective disorder, bipolar type: Secondary | ICD-10-CM | POA: Insufficient documentation

## 2019-11-01 LAB — CBC WITH DIFFERENTIAL/PLATELET
Abs Immature Granulocytes: 0.02 10*3/uL (ref 0.00–0.07)
Basophils Absolute: 0.1 10*3/uL (ref 0.0–0.1)
Basophils Relative: 1 %
Eosinophils Absolute: 0.2 10*3/uL (ref 0.0–0.5)
Eosinophils Relative: 4 %
HCT: 46.8 % (ref 39.0–52.0)
Hemoglobin: 15.6 g/dL (ref 13.0–17.0)
Immature Granulocytes: 0 %
Lymphocytes Relative: 29 %
Lymphs Abs: 1.7 10*3/uL (ref 0.7–4.0)
MCH: 29 pg (ref 26.0–34.0)
MCHC: 33.3 g/dL (ref 30.0–36.0)
MCV: 87 fL (ref 80.0–100.0)
Monocytes Absolute: 0.6 10*3/uL (ref 0.1–1.0)
Monocytes Relative: 9 %
Neutro Abs: 3.5 10*3/uL (ref 1.7–7.7)
Neutrophils Relative %: 57 %
Platelets: 246 10*3/uL (ref 150–400)
RBC: 5.38 MIL/uL (ref 4.22–5.81)
RDW: 12.4 % (ref 11.5–15.5)
WBC: 6.1 10*3/uL (ref 4.0–10.5)
nRBC: 0 % (ref 0.0–0.2)

## 2019-11-01 LAB — COMPREHENSIVE METABOLIC PANEL
ALT: 17 U/L (ref 0–44)
AST: 23 U/L (ref 15–41)
Albumin: 4.6 g/dL (ref 3.5–5.0)
Alkaline Phosphatase: 96 U/L (ref 38–126)
Anion gap: 16 — ABNORMAL HIGH (ref 5–15)
BUN: 14 mg/dL (ref 6–20)
CO2: 20 mmol/L — ABNORMAL LOW (ref 22–32)
Calcium: 9.2 mg/dL (ref 8.9–10.3)
Chloride: 103 mmol/L (ref 98–111)
Creatinine, Ser: 1.16 mg/dL (ref 0.61–1.24)
GFR calc Af Amer: >60 mL/min (ref >60)
GFR calc non Af Amer: >60 mL/min (ref >60)
Glucose, Bld: 101 mg/dL — ABNORMAL HIGH (ref 70–99)
Potassium: 3.1 mmol/L — ABNORMAL LOW (ref 3.5–5.1)
Sodium: 139 mmol/L (ref 135–145)
Total Bilirubin: 0.8 mg/dL (ref 0.3–1.2)
Total Protein: 7.5 g/dL (ref 6.5–8.1)

## 2019-11-01 LAB — RAPID URINE DRUG SCREEN, HOSP PERFORMED
Amphetamines: NOT DETECTED
Barbiturates: NOT DETECTED
Benzodiazepines: NOT DETECTED
Cocaine: NOT DETECTED
Opiates: NOT DETECTED
Tetrahydrocannabinol: NOT DETECTED

## 2019-11-01 LAB — SARS CORONAVIRUS 2 BY RT PCR (HOSPITAL ORDER, PERFORMED IN ~~LOC~~ HOSPITAL LAB): SARS Coronavirus 2: NEGATIVE

## 2019-11-01 MED ORDER — LORAZEPAM 2 MG/ML IJ SOLN
2.0000 mg | Freq: Once | INTRAMUSCULAR | Status: AC
  Administered 2019-11-01: 2 mg via INTRAMUSCULAR
  Filled 2019-11-01: qty 1

## 2019-11-01 MED ORDER — HALOPERIDOL LACTATE 5 MG/ML IJ SOLN
5.0000 mg | Freq: Once | INTRAMUSCULAR | Status: AC
  Administered 2019-11-01: 5 mg via INTRAMUSCULAR
  Filled 2019-11-01: qty 1

## 2019-11-01 NOTE — ED Triage Notes (Signed)
BIB [name] under IVC. Family states patient has been noncompliant with medication and has been aggressive and hallucinating. Tased by [name] before arrival. Uncooperative during triage
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
## 2019-11-01 NOTE — ED Notes (Signed)
Pt placed in burgundy scrubs, wanded by security.

## 2019-11-01 NOTE — ED Notes (Signed)
3 copies of IVC paperwork made, original faxed to [facility]

## 2019-11-02 MED ORDER — RISPERIDONE 2 MG PO TBDP
3.0000 mg | ORAL_TABLET | Freq: Every day | ORAL | Status: DC
  Administered 2019-11-02 (×2): 3 mg via ORAL
  Filled 2019-11-02 (×2): qty 1

## 2019-11-02 MED ORDER — POTASSIUM CHLORIDE CRYS ER 20 MEQ PO TBCR
40.0000 meq | EXTENDED_RELEASE_TABLET | Freq: Once | ORAL | Status: AC
  Administered 2019-11-02: 40 meq via ORAL
  Filled 2019-11-02: qty 2

## 2019-11-02 MED ORDER — HALOPERIDOL 5 MG PO TABS
10.0000 mg | ORAL_TABLET | Freq: Two times a day (BID) | ORAL | Status: DC
  Administered 2019-11-02 – 2019-11-03 (×3): 10 mg via ORAL
  Filled 2019-11-02 (×3): qty 2

## 2019-11-02 MED ORDER — CARBAMAZEPINE 200 MG PO TABS
200.0000 mg | ORAL_TABLET | Freq: Two times a day (BID) | ORAL | Status: DC
  Administered 2019-11-02 – 2019-11-03 (×3): 200 mg via ORAL
  Filled 2019-11-02 (×3): qty 1

## 2019-11-02 MED ORDER — NICOTINE 21 MG/24HR TD PT24
21.0000 mg | MEDICATED_PATCH | Freq: Every day | TRANSDERMAL | Status: DC
  Administered 2019-11-02: 21 mg via TRANSDERMAL
  Filled 2019-11-02: qty 1

## 2019-11-02 MED ORDER — RISPERIDONE MICROSPHERES ER 25 MG IM SRER: 25.0000 mg | INTRAMUSCULAR | Status: DC

## 2019-11-02 NOTE — ED Notes (Signed)
Received a call from [place] from [facility]. Pt still sleeping, will be unable to participate in TTS. Will call [facility] when pt wakes up and able to do TTS.

## 2019-11-02 NOTE — Progress Notes (Signed)
Pt meets inpatient criteria per Denzil Magnuson, NP. Referral information has been sent to the following hospitals for review:  [facility]  [facility]  [facility]  [facility]  [facility]  [facility]  [facility]      Disposition will continue to assist with inpatient placement needs.   Wells Guiles, LCSW, LCAS Disposition CSW [facility]/TTS [phone]

## 2019-11-02 NOTE — ED Notes (Signed)
Updated pt's father on plan of care over phone

## 2019-11-02 NOTE — ED Notes (Signed)
TTS completed. 

## 2019-11-02 NOTE — BH Assessment (Addendum)
Per Drue Flirt, RN recieved Hadol 5 mg and Ativan 2 mg, earlier. Pt is currently sleeping and unable to engage in TTS assessment. RN to call once pt is able to be assessed.    Redmond Pulling, MS, [facility], [facility] Triage Specialist [phone].

## 2019-11-02 NOTE — ED Notes (Signed)
Pt walked over to purple zone. Calm and cooperative at this time. Dressed in purple scrubs. Pt very sleepy. Pt states that he would just like to go to sleep. Turned TV on and answered questions for pt.
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
## 2019-11-02 NOTE — ED Notes (Signed)
Breakfast ordered--Taiya Nutting  

## 2019-11-02 NOTE — ED Provider Notes (Signed)
Emergency Medicine Observation Re-evaluation Note  Brendan Hogan is a 24 y.o. male, seen on rounds today.  Pt initially presented to the ED for complaints of IVC Currently, the patient is resting in bed calmly.  Physical Exam  BP 131/74 (BP Location: Right Arm)   Pulse (!) 113   Temp 99.1 F (37.3 C) (Oral)   Resp 18   Ht 5\' 7"  (1.702 m)   Wt 56.7 kg   SpO2 97%   BMI 19.58 kg/m  Physical Exam Resting calmly, respirations even and unlabored. Reports compliance with medications at home and would like to be discharged home. ED Course / MDM  EKG:    I have reviewed the labs performed to date as well as medications administered while in observation.  Recent changes in the last 24 hours include TTS evaluation, recommends IP placement. Plan  Current plan is for IP. Patient is under full IVC at this time. Given nicotine patch, reports using chewing tobacco daily.    , PA-C 11/02/19 1201    01/03/20, MD 11/02/19 [phone]

## 2019-11-03 MED ORDER — RISPERIDONE MICROSPHERES ER 25 MG IM SRER
25.0000 mg | INTRAMUSCULAR | Status: DC
  Administered 2019-11-03: 25 mg via INTRAMUSCULAR
  Filled 2019-11-03: qty 2

## 2019-11-03 NOTE — Progress Notes (Signed)
Patient seen. Chart reviewed. Brendan Hogan is a 24 year old male with history of schizophrenia. He is familiar to this Clinical research associate due to 2 recent hospitalizations at [facility] since May 2021. He was last discharged on 10/25/19 and received LAI Risperdal Consta 25 mg on 10/24/19. He-re-presented to [facility] on 11/01/19 under IVC from his father for non-compliance with his medications and aggressive behaviors. He required IM medications for agitation at that time.  On assessment today, Brendan Hogan seen sitting calmly in bed. He admits to being angry on admission to the ED because he did not want to be back in the hospital. He admits to conflict with his father at home. Per his family's report, patient has not been compliant with oral medications. Patient states he has been compliant with medications and that family commit him "because they just don't want me at home." He is unable to explain his acute agitation yesterday. Denies drug use and UDS is negative. He states that he feels better now after a full night of sleep. He denies any SI/HI/AVH. He shows no signs of responding to internal stimuli. No paranoid or delusional thought content expressed. Nursing reports patient has been calm and cooperative today. Based on my familiarity with this patient, he appears to be at his baseline mental status. He continues to state that he does not want to be in the hospital and states "I promise I'll take these medications if you just let me out of here." He is agreeable to following up outpatient at [facility].  Patient does not meet criteria for involuntary commitment. He is psych cleared for discharge. Consulted with Dr. Lucianne Muss. I have ordered patient's Risperdal Consta injection for today. Will consult with CSW for [facility] outpatient appointment. EDP notified.

## 2019-11-03 NOTE — ED Provider Notes (Signed)
Patient brought here under IVC for aggressive behavior and noncompliance with psychiatric medications.  Patient medicated here in the ER and psychiatric medications reimplemented.  Patient has been evaluated by TTS who feels as though he is suitable for discharge.  Patient cooperative and does not express any suicidal or homicidal ideation.   Geoffery Lyons, MD 11/03/19 1150

## 2019-11-04 ENCOUNTER — Other Ambulatory Visit: Payer: Self-pay

## 2019-11-04 ENCOUNTER — Encounter (HOSPITAL_COMMUNITY): Payer: Self-pay | Admitting: Psychiatry

## 2019-11-04 ENCOUNTER — Ambulatory Visit (INDEPENDENT_AMBULATORY_CARE_PROVIDER_SITE_OTHER): Payer: No Payment, Other | Admitting: Psychiatry

## 2019-11-04 DIAGNOSIS — F5101 Primary insomnia: Secondary | ICD-10-CM

## 2019-11-04 MED ORDER — TRAZODONE HCL 50 MG PO TABS: 25.0000 mg | ORAL_TABLET | Freq: Every day | ORAL | 1 refills | Status: DC

## 2019-11-04 NOTE — Progress Notes (Signed)
Psychiatric Initial Adult Assessment   Patient Identification: Brendan Hogan MRN:  [id number] Date of Evaluation:  11/04/2019 Referral Source: [facility] Chief Complaint:  " My only issue is poor sleep" Visit Diagnosis:    ICD-10-CM   1. Primary insomnia  F51.01 traZODone (DESYREL) 50 MG tablet    History of Present Illness: 24 year old male seen today for initial psychiatric evaluation.  He was referred to outpatient psychiatry after being discharged from [facility].  He has a psychiatric history of brief psychotic disorder, bipolar disorder, and schizophrenia.  On 11/01/2019 he was IVC for aggressive behavior and disorganized thinking.  He was also IVC'd on 10/18/2019 for aggression and auditory hallucination.  On 11/02/2019 patient was started on Tegretol 200 mg twice daily, Haldol 10 mg twice daily, risperidone 3 mg at bedtime and Risperdal Consta 25 mg q. 14 on.  He notes that since his discharge he has not picked up thes prescription and refuses to take any medications as he feels that he does not need them.  Today he endorses insomnia, stating that he sleeps 3 to 5 hours a night.  Patient notes that he has had poor sleep on and off for years.  Patient was seen with his father who reports that at times patient stays awake for 24 hours.  He became irritable when his father began to speak and notes that he is lying about his mental health status.  He notes that he feels like his father is out to get him.  Patient's father reports that he just wants him to be productive and healthy.  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  Provider gave patient resources for online job sites. Today he also denies symptoms of depression and anxiety became anxious when he received his Risperdal injection in the hospital.  He notes that time impulsive and excessively spends money.  He denies SI/HI/VH.  Patient is agreeable to starting trazodone 25  mg at bedtime for sleep.  He will also follow-up with outpatient psychiatry in 1 month for reevaluation of mental health condition as well as medication adjustments.  No other concerns noted at this time.  Associated Signs/Symptoms: Depression Symptoms:  insomnia, (Hypo) Manic Symptoms:  Financial Extravagance, Impulsivity, Anxiety Symptoms:  denies Psychotic Symptoms:  Paranoia, Notes feels like father is out to get him PTSD Symptoms: NA  Past Psychiatric History: Psychotic disorder, bipolar disorder, schizophrenia, and psychosis.  Previous Psychotropic Medications: Yes   Substance Abuse History in the last 12 months:  No.  Consequences of Substance Abuse: NA  Past Medical History:  Past Medical History:  Diagnosis Date  . Psychosis [facility]) 05/2019   [facility]   . Seasonal allergies     Past Surgical History:  Procedure Laterality Date  . TONSILLECTOMY      Family Psychiatric History: Denies  Family History:  Family History  Problem Relation Age of Onset  . Hypertension Mother     Social History:   Social History   Socioeconomic History  . Marital status: Single    Spouse name: Not on file  . Number of children: Not on file  . Years of education: Not on file  . Highest education level: Not on file  Occupational History  . Not on file  Tobacco Use  . Smoking status: Current Every Day Smoker    Types: Cigarettes  . Smokeless tobacco: Former Engineer, water and Sexual  Activity  . Alcohol use: Not Currently    Comment: rare  . Drug use: No  . Sexual activity: Not on file  Other Topics Concern  . Not on file  Social History Narrative  . Not on file   Social Determinants of Health   Financial Resource Strain:   . Difficulty of Paying Living Expenses:   Food Insecurity:   . Worried About Programme researcher, broadcasting/film/video in the Last Year:   . Barista in the Last Year:   Transportation Needs:   . Freight forwarder (Medical):   Marland Kitchen Lack of  Transportation (Non-Medical):   Physical Activity:   . Days of Exercise per Week:   . Minutes of Exercise per Session:   Stress:   . Feeling of Stress :   Social Connections:   . Frequency of Communication with Friends and Family:   . Frequency of Social Gatherings with Friends and Family:   . Attends Religious Services:   . Active Member of Clubs or Organizations:   . Attends Banker Meetings:   Marland Kitchen Marital Status:     Additional Social History: Patient resides in [place] with his father.  He is currently single and has no children.  He is currently unemployed.  He denies alcohol or illicit drug use.  He endorses using chewing tobacco occasionally.  Allergies:  No Known Allergies  Metabolic Disorder Labs: Lab Results  Component Value Date   HGBA1C 5.0 09/08/2019   MPG 96.8 09/08/2019   Lab Results  Component Value Date   PROLACTIN 26.5 (H) 09/08/2019   Lab Results  Component Value Date   CHOL 159 09/08/2019   TRIG 68 09/08/2019   HDL 53 09/08/2019   CHOLHDL 3.0 09/08/2019   VLDL 14 09/08/2019   LDLCALC 92 09/08/2019   Lab Results  Component Value Date   TSH 1.560 09/08/2019    Therapeutic Level Labs: No results found for: LITHIUM Lab Results  Component Value Date   CBMZ 7.7 10/25/2019   No results found for: VALPROATE  Current Medications: Current Outpatient Medications  Medication Sig Dispense Refill  . carbamazepine (TEGRETOL) 200 MG tablet Take 1 tablet (200 mg total) by mouth 2 (two) times daily. (Patient not taking: Reported on 11/02/2019) 60 tablet 0  . haloperidol (HALDOL) 10 MG tablet Take 1 tablet (10 mg total) by mouth 2 (two) times daily. (Patient not taking: Reported on 11/02/2019) 60 tablet 0  . nicotine polacrilex (NICORETTE) 2 MG gum Take 1 each (2 mg total) by mouth as needed for smoking cessation. (Patient not taking: Reported on 11/02/2019) 100 tablet 0  . risperiDONE (RISPERDAL M-TABS) 3 MG disintegrating tablet Take 1 tablet (3 mg  total) by mouth at bedtime. (Patient not taking: Reported on 11/02/2019) 30 tablet 0  . [START ON 11/07/2019] risperiDONE microspheres (RISPERDAL CONSTA) 25 MG injection Inject 2 mLs (25 mg total) into the muscle every 14 (fourteen) days. 1 each 0  . traZODone (DESYREL) 50 MG tablet Take 0.5 tablets (25 mg total) by mouth at bedtime. 30 tablet 1   No current facility-administered medications for this visit.    Musculoskeletal: Strength & Muscle Tone: within normal limits Gait & Station: normal Patient leans: N/A  Psychiatric Specialty Exam: Review of Systems  There were no vitals taken for this visit.There is no height or weight on file to calculate BMI.  General Appearance: Well Groomed  Eye Contact:  Good  Speech:  Clear and Coherent and Normal Rate  Volume:  Normal  Mood:  Euthymic  Affect:  Congruent  Thought Process:  Coherent, Goal Directed and Linear  Orientation:  Full (Time, Place, and Person)  Thought Content:  WDL and Logical  Suicidal Thoughts:  No  Homicidal Thoughts:  No  Memory:  Immediate;   Good Recent;   Good Remote;   Good  Judgement:  Fair  Insight:  Fair  Psychomotor Activity:  Normal  Concentration:  Concentration: Good and Attention Span: Good  Recall:  Good  Fund of Knowledge:Good  Language: Good  Akathisia:  No  Handed:  Right  AIMS (if indicated):  not done  Assets:  Communication Skills Desire for Improvement Housing Social Support  ADL's:  Intact  Cognition: WNL  Sleep:  Poor   Screenings: AIMS     [facility]) [facility]  AIMS Total Score 0 0    AUDIT     [facility]) [facility]  Alcohol Use Disorder Identification Test Final Score (AUDIT) 0 0       Assessment and Plan: Patient reports that he has been experiencing insomnia.  He is agreeable to starting trazodone 25 mg at bedtime.  Patient was recently started on Tegretol 200 mg twice daily, Haldol 10 mg twice daily, risperidone 3 mg at bedtime and Risperdal Consta 25 mg q. 14 on 11/02/2019. Todays he however reports that since discharge he has not taken medications that were prescribed and state that he refuses to take medications as he feels he only needs medication for sleep.  1. Primary insomnia  Start- traZODone (DESYREL) 50 MG tablet; Take 0.5 tablets (25 mg total) by mouth at bedtime.  Dispense: 30 tablet; Refill: 1  Follow-up in 1 month    Shanna Cisco, NP 7/8/20216:31 PM

## 2019-11-15 ENCOUNTER — Encounter (HOSPITAL_COMMUNITY): Payer: Self-pay | Admitting: Emergency Medicine

## 2019-11-15 ENCOUNTER — Emergency Department (HOSPITAL_COMMUNITY)
Admission: EM | Admit: 2019-11-15 | Discharge: 2019-11-16 | Disposition: A | Discharge status: Other Acute Inpatient Hospital | Payer: Self-pay | Attending: Emergency Medicine | Admitting: Emergency Medicine

## 2019-11-15 DIAGNOSIS — F29 Unspecified psychosis not due to a substance or known physiological condition: Secondary | ICD-10-CM | POA: Insufficient documentation

## 2019-11-15 DIAGNOSIS — Z20822 Contact with and (suspected) exposure to covid-19: Secondary | ICD-10-CM | POA: Insufficient documentation

## 2019-11-15 DIAGNOSIS — F259 Schizoaffective disorder, unspecified: Secondary | ICD-10-CM | POA: Insufficient documentation

## 2019-11-15 DIAGNOSIS — F23 Brief psychotic disorder: Secondary | ICD-10-CM | POA: Insufficient documentation

## 2019-11-15 DIAGNOSIS — F17210 Nicotine dependence, cigarettes, uncomplicated: Secondary | ICD-10-CM | POA: Insufficient documentation

## 2019-11-15 DIAGNOSIS — R4689 Other symptoms and signs involving appearance and behavior: Secondary | ICD-10-CM

## 2019-11-15 DIAGNOSIS — R456 Violent behavior: Secondary | ICD-10-CM | POA: Insufficient documentation

## 2019-11-15 DIAGNOSIS — Z79899 Other long term (current) drug therapy: Secondary | ICD-10-CM | POA: Insufficient documentation

## 2019-11-15 LAB — CBC WITH DIFFERENTIAL/PLATELET
Abs Immature Granulocytes: 0.01 10*3/uL (ref 0.00–0.07)
Basophils Absolute: 0 10*3/uL (ref 0.0–0.1)
Basophils Relative: 0 %
Eosinophils Absolute: 0 10*3/uL (ref 0.0–0.5)
Eosinophils Relative: 0 %
HCT: 43.8 % (ref 39.0–52.0)
Hemoglobin: 14.7 g/dL (ref 13.0–17.0)
Immature Granulocytes: 0 %
Lymphocytes Relative: 16 %
Lymphs Abs: 0.7 10*3/uL (ref 0.7–4.0)
MCH: 29.1 pg (ref 26.0–34.0)
MCHC: 33.6 g/dL (ref 30.0–36.0)
MCV: 86.7 fL (ref 80.0–100.0)
Monocytes Absolute: 0.5 10*3/uL (ref 0.1–1.0)
Monocytes Relative: 10 %
Neutro Abs: 3.4 10*3/uL (ref 1.7–7.7)
Neutrophils Relative %: 74 %
Platelets: 209 10*3/uL (ref 150–400)
RBC: 5.05 MIL/uL (ref 4.22–5.81)
RDW: 12.3 % (ref 11.5–15.5)
WBC: 4.6 10*3/uL (ref 4.0–10.5)
nRBC: 0 % (ref 0.0–0.2)

## 2019-11-15 LAB — COMPREHENSIVE METABOLIC PANEL
ALT: 16 U/L (ref 0–44)
AST: 27 U/L (ref 15–41)
Albumin: 4.7 g/dL (ref 3.5–5.0)
Alkaline Phosphatase: 104 U/L (ref 38–126)
Anion gap: 14 (ref 5–15)
BUN: 9 mg/dL (ref 6–20)
CO2: 22 mmol/L (ref 22–32)
Calcium: 9.4 mg/dL (ref 8.9–10.3)
Chloride: 102 mmol/L (ref 98–111)
Creatinine, Ser: 0.91 mg/dL (ref 0.61–1.24)
GFR calc Af Amer: >60 mL/min (ref >60)
GFR calc non Af Amer: >60 mL/min (ref >60)
Glucose, Bld: 110 mg/dL — ABNORMAL HIGH (ref 70–99)
Potassium: 3.6 mmol/L (ref 3.5–5.1)
Sodium: 138 mmol/L (ref 135–145)
Total Bilirubin: 1.2 mg/dL (ref 0.3–1.2)
Total Protein: 7.2 g/dL (ref 6.5–8.1)

## 2019-11-15 LAB — URINALYSIS, COMPLETE (UACMP) WITH MICROSCOPIC
Bacteria, UA: NONE SEEN
Bilirubin Urine: NEGATIVE
Glucose, UA: NEGATIVE mg/dL
Ketones, ur: NEGATIVE mg/dL
Leukocytes,Ua: NEGATIVE
Nitrite: NEGATIVE
Protein, ur: NEGATIVE mg/dL
Specific Gravity, Urine: 1.004 — ABNORMAL LOW (ref 1.005–1.030)
pH: 6 (ref 5.0–8.0)

## 2019-11-15 LAB — ETHANOL: Alcohol, Ethyl (B): 50 mg/dL — ABNORMAL HIGH (ref <10)

## 2019-11-15 LAB — SARS CORONAVIRUS 2 BY RT PCR (HOSPITAL ORDER, PERFORMED IN ~~LOC~~ HOSPITAL LAB): SARS Coronavirus 2: NEGATIVE

## 2019-11-15 MED ORDER — ONDANSETRON HCL 4 MG PO TABS: 4.0000 mg | ORAL_TABLET | Freq: Three times a day (TID) | ORAL | Status: DC | PRN

## 2019-11-15 MED ORDER — ZIPRASIDONE MESYLATE 20 MG IM SOLR
20.0000 mg | Freq: Once | INTRAMUSCULAR | Status: DC
  Filled 2019-11-15: qty 20

## 2019-11-15 MED ORDER — TRAZODONE HCL 50 MG PO TABS
25.0000 mg | ORAL_TABLET | Freq: Every day | ORAL | Status: DC
  Administered 2019-11-15: 25 mg via ORAL
  Filled 2019-11-15: qty 1

## 2019-11-15 MED ORDER — CARBAMAZEPINE 200 MG PO TABS
200.0000 mg | ORAL_TABLET | Freq: Two times a day (BID) | ORAL | Status: DC
  Administered 2019-11-15 – 2019-11-16 (×3): 200 mg via ORAL
  Filled 2019-11-15 (×3): qty 1

## 2019-11-15 MED ORDER — HALOPERIDOL LACTATE 5 MG/ML IJ SOLN: 5.0000 mg | Freq: Once | INTRAMUSCULAR | Status: DC

## 2019-11-15 MED ORDER — ALUM & MAG HYDROXIDE-SIMETH 200-200-20 MG/5ML PO SUSP: 30.0000 mL | Freq: Four times a day (QID) | ORAL | Status: DC | PRN

## 2019-11-15 MED ORDER — RISPERIDONE 2 MG PO TBDP
3.0000 mg | ORAL_TABLET | Freq: Every day | ORAL | Status: DC
  Administered 2019-11-15: 3 mg via ORAL
  Filled 2019-11-15 (×2): qty 1

## 2019-11-15 MED ORDER — LORAZEPAM 2 MG/ML IJ SOLN
2.0000 mg | Freq: Once | INTRAMUSCULAR | Status: AC
  Administered 2019-11-15: 2 mg via INTRAMUSCULAR
  Filled 2019-11-15: qty 1

## 2019-11-15 MED ORDER — ACETAMINOPHEN 325 MG PO TABS: 650.0000 mg | ORAL_TABLET | ORAL | Status: DC | PRN

## 2019-11-15 MED ORDER — NICOTINE POLACRILEX 2 MG MT GUM
2.0000 mg | CHEWING_GUM | OROMUCOSAL | Status: DC | PRN
  Administered 2019-11-15: 2 mg via ORAL
  Filled 2019-11-15: qty 1

## 2019-11-15 MED ORDER — LORAZEPAM 1 MG PO TABS: 1.0000 mg | ORAL_TABLET | ORAL | Status: DC | PRN

## 2019-11-15 MED ORDER — STERILE WATER FOR INJECTION IJ SOLN
INTRAMUSCULAR | Status: AC
  Filled 2019-11-15: qty 10

## 2019-11-15 MED ORDER — NICOTINE 21 MG/24HR TD PT24
21.0000 mg | MEDICATED_PATCH | Freq: Every day | TRANSDERMAL | Status: DC
  Administered 2019-11-15: 21 mg via TRANSDERMAL
  Filled 2019-11-15: qty 1

## 2019-11-15 MED ORDER — HALOPERIDOL 5 MG PO TABS
10.0000 mg | ORAL_TABLET | Freq: Two times a day (BID) | ORAL | Status: DC
  Administered 2019-11-15 – 2019-11-16 (×3): 10 mg via ORAL
  Filled 2019-11-15 (×3): qty 2

## 2019-11-15 MED ORDER — ZIPRASIDONE MESYLATE 20 MG IM SOLR: 20.0000 mg | Freq: Two times a day (BID) | INTRAMUSCULAR | Status: DC | PRN

## 2019-11-15 MED ORDER — ZIPRASIDONE MESYLATE 20 MG IM SOLR
20.0000 mg | Freq: Once | INTRAMUSCULAR | Status: AC
  Administered 2019-11-15: 20 mg via INTRAMUSCULAR

## 2019-11-15 NOTE — ED Notes (Signed)
Sitter arrived to bedside.  

## 2019-11-15 NOTE — ED Triage Notes (Signed)
PT arrived with [name] X4 and IVC Patient is aggressive and hallucinating. In handcuff

## 2019-11-15 NOTE — ED Notes (Signed)
TTS in progress 
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
## 2019-11-15 NOTE — ED Notes (Signed)
Patient in room talking to him self and yelling; pt waiting for Nicotine gum to come from [facility]
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
## 2019-11-15 NOTE — BH Assessment (Signed)
Patient referred to the to the following facilities for consideration of bed placement.                           [facility]       [facility]      [facility]     [facility]      [facility]      [facility]       [facility]     [facility]       [facility]      [facility]      [facility]      [facility]

## 2019-11-15 NOTE — ED Notes (Signed)
Original IVC paper placed in red folder

## 2019-11-15 NOTE — ED Notes (Signed)
Pt made phone call home to family

## 2019-11-15 NOTE — ED Notes (Signed)
Pt showered

## 2019-11-15 NOTE — ED Notes (Signed)
Approached by pt father outside of purple [facility]. Father reports pt being noncompliant with medication causing extreme aggressive behavior and psychosis. Pt father requesting long term placement.

## 2019-11-16 ENCOUNTER — Encounter (HOSPITAL_COMMUNITY): Payer: Self-pay | Admitting: Registered Nurse

## 2019-11-16 ENCOUNTER — Other Ambulatory Visit: Payer: Self-pay

## 2019-11-16 ENCOUNTER — Inpatient Hospital Stay (HOSPITAL_COMMUNITY)
Admission: AD | Admit: 2019-11-16 | Discharge: 2019-11-22 | DRG: 885 | Disposition: A | Discharge status: Home / Self Care | Payer: Federal, State, Local not specified - Other | Attending: Psychiatry | Admitting: Psychiatry

## 2019-11-16 DIAGNOSIS — F17200 Nicotine dependence, unspecified, uncomplicated: Secondary | ICD-10-CM | POA: Diagnosis present

## 2019-11-16 DIAGNOSIS — Z9114 Patient's other noncompliance with medication regimen: Secondary | ICD-10-CM

## 2019-11-16 DIAGNOSIS — Z8249 Family history of ischemic heart disease and other diseases of the circulatory system: Secondary | ICD-10-CM | POA: Diagnosis not present

## 2019-11-16 DIAGNOSIS — F259 Schizoaffective disorder, unspecified: Secondary | ICD-10-CM | POA: Diagnosis present

## 2019-11-16 DIAGNOSIS — G4700 Insomnia, unspecified: Secondary | ICD-10-CM | POA: Diagnosis present

## 2019-11-16 DIAGNOSIS — Z560 Unemployment, unspecified: Secondary | ICD-10-CM | POA: Diagnosis not present

## 2019-11-16 DIAGNOSIS — F250 Schizoaffective disorder, bipolar type: Secondary | ICD-10-CM | POA: Diagnosis not present

## 2019-11-16 MED ORDER — TRAZODONE 25 MG HALF TABLET
25.0000 mg | ORAL_TABLET | Freq: Every day | ORAL | Status: DC
  Filled 2019-11-16: qty 1

## 2019-11-16 MED ORDER — RISPERIDONE 1 MG PO TBDP
3.0000 mg | ORAL_TABLET | Freq: Every day | ORAL | Status: DC
  Administered 2019-11-16 – 2019-11-21 (×5): 3 mg via ORAL
  Filled 2019-11-16 (×6): qty 3
  Filled 2019-11-16: qty 7
  Filled 2019-11-16 (×2): qty 3

## 2019-11-16 MED ORDER — ALUM & MAG HYDROXIDE-SIMETH 200-200-20 MG/5ML PO SUSP: 30.0000 mL | ORAL | Status: DC | PRN

## 2019-11-16 MED ORDER — LORAZEPAM 1 MG PO TABS: 1.0000 mg | ORAL_TABLET | ORAL | Status: DC | PRN

## 2019-11-16 MED ORDER — MAGNESIUM HYDROXIDE 400 MG/5ML PO SUSP: 30.0000 mL | Freq: Every day | ORAL | Status: DC | PRN

## 2019-11-16 MED ORDER — CARBAMAZEPINE 200 MG PO TABS
200.0000 mg | ORAL_TABLET | Freq: Two times a day (BID) | ORAL | Status: DC
  Administered 2019-11-16 – 2019-11-20 (×8): 200 mg via ORAL
  Filled 2019-11-16 (×13): qty 1

## 2019-11-16 MED ORDER — HALOPERIDOL 5 MG PO TABS
10.0000 mg | ORAL_TABLET | Freq: Two times a day (BID) | ORAL | Status: DC
  Administered 2019-11-16 – 2019-11-22 (×11): 10 mg via ORAL
  Filled 2019-11-16 (×3): qty 2
  Filled 2019-11-16: qty 28
  Filled 2019-11-16: qty 2
  Filled 2019-11-16: qty 28
  Filled 2019-11-16 (×12): qty 2

## 2019-11-16 MED ORDER — ZIPRASIDONE MESYLATE 20 MG IM SOLR: 20.0000 mg | Freq: Two times a day (BID) | INTRAMUSCULAR | Status: DC | PRN

## 2019-11-16 MED ORDER — ACETAMINOPHEN 325 MG PO TABS: 650.0000 mg | ORAL_TABLET | Freq: Four times a day (QID) | ORAL | Status: DC | PRN

## 2019-11-16 MED ORDER — NICOTINE POLACRILEX 2 MG MT GUM
2.0000 mg | CHEWING_GUM | OROMUCOSAL | Status: DC | PRN
  Administered 2019-11-16 – 2019-11-22 (×14): 2 mg via ORAL
  Filled 2019-11-16 (×7): qty 1

## 2019-11-16 NOTE — Tx Team (Signed)
Initial Treatment Plan 11/16/2019 11:15 AM Brendan Hogan [id number]:[id number]    PATIENT STRESSORS: Health problems Medication change or noncompliance Substance abuse   PATIENT STRENGTHS: Physical Health Supportive family/friends   PATIENT IDENTIFIED PROBLEMS: "To be normal"  Anxiety  Psychosis  Medication Noncompliance               DISCHARGE CRITERIA:  Adequate post-discharge living arrangements Safe-care adequate arrangements made  PRELIMINARY DISCHARGE PLAN: Outpatient therapy Return to previous living arrangement  PATIENT/FAMILY INVOLVEMENT: This treatment plan has been presented to and reviewed with the patient, Brendan Hogan, and/or family member.  The patient and family have been given the opportunity to ask questions and make suggestions.  Clarene Critchley, RN 11/16/2019, 11:15 AM
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
## 2019-11-16 NOTE — Progress Notes (Signed)
Admission Note: Patient is a 24 year old male admitted to the unit from [facility] under IVC status for medication noncompliance, not sleeping or eating for 2 days.  States goal is to "live a normal life." Patient presents with anxious affect and paranoid behavior.  Appears drowsy and fidgety at the same time.  Complain about medication causing drowsiness.  Admission plan of care reviewed and consent signed.  Skin assessment and personal belongings completed.  Skin is dry and intact.  No contraband found.  Routine safety checks initiated.  Patient oriented to the unit, staff and room.  Patient became more paranoid, walked out of his room when introduced to his roommate and stated, "oh no this is not going to work." Patient became irritable agitated and started pacing the hallway while posturing in an angry gesture.   Patient had to be placed in a quiet room for safety while staff arrange for a different room.

## 2019-11-16 NOTE — Progress Notes (Signed)
Pt very paranoid on the unit.    11/16/19 2100  Psych Admission Type (Psych Patients Only)  Admission Status Involuntary  Psychosocial Assessment  Patient Complaints Anxiety;Irritability  Eye Contact Fair  Facial Expression Animated;Anxious;Pensive  Affect Anxious;Irritable;Labile  Speech Argumentative;Logical/coherent  Interaction Attention-seeking;Childlike  Motor Activity Fidgety;Restless;Unsteady  Appearance/Hygiene In scrubs  Behavior Characteristics Cooperative  Mood Anxious  Thought Process  Coherency Concrete thinking;Disorganized  Content Blaming others;Paranoia  Delusions Controlled;Paranoid;Persecutory  Perception WDL  Hallucination None reported or observed  Judgment Limited  Confusion Mild  Danger to Self  Current suicidal ideation? Denies  Danger to Others  Danger to Others None reported or observed

## 2019-11-16 NOTE — ED Notes (Addendum)
Pt attempting to ambulate in hallway - pt returned to room as directed.
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
## 2019-11-16 NOTE — Progress Notes (Signed)
Pt accepted to [facility] Room 503-02 to service of [facility] after 0900 hours.  Report may be called to [phone] when transportation has been arranged.

## 2019-11-16 NOTE — Progress Notes (Signed)
°  Recreation Therapy Notes  Patient admitted to unit 7.20.21. Due to admission within last year, no new assessment conducted at this time. Last assessment updated 6.22.21. Patient reports not knowing if he has any stressors.  Patient also states "I don't know" when asked about coping skills.  Leisure interests were still lifting weights, football and soccer.  When asked about strengths and areas of improvement, patient stated "I don't know".    Patient denies SI, HI, AVH at this time. When asked about goal for current admission, patient states "I don't know".    Caroll Rancher, LRT/CTRS

## 2019-11-16 NOTE — ED Notes (Signed)
Pt called his father and advised of tx plan - Accepted to [facility] - Pt changed into burgundy scrubs - ALL belongings - 1 labeled belongings bag - LEO - Pt aware.

## 2019-11-17 MED ORDER — BENZTROPINE MESYLATE 0.5 MG PO TABS
0.5000 mg | ORAL_TABLET | Freq: Two times a day (BID) | ORAL | Status: DC
  Administered 2019-11-17 – 2019-11-22 (×9): 0.5 mg via ORAL
  Filled 2019-11-17 (×2): qty 1
  Filled 2019-11-17: qty 14
  Filled 2019-11-17 (×6): qty 1
  Filled 2019-11-17: qty 14
  Filled 2019-11-17 (×5): qty 1

## 2019-11-17 NOTE — Tx Team (Cosign Needed)
Initial Treatment Plan 11/17/2019 8:10 AM [name] NTI:[id number]    Interdisciplinary Treatment and Diagnostic Plan Update  11/17/2019 Time of Session: 8:10 AM  Brendan Hogan MRN: [id number]  Principal Diagnosis: <principal problem not specified>  Secondary Diagnoses: Active Problems:   Schizoaffective disorder (HCC)   Current Medications:  Current Facility-Administered Medications  Medication Dose Route Frequency Provider Last Rate Last Admin  . acetaminophen (TYLENOL) tablet 650 mg  650 mg Oral Q6H PRN Nira Conn A, NP      . alum & mag hydroxide-simeth (MAALOX/MYLANTA) 200-200-20 MG/5ML suspension 30 mL  30 mL Oral Q4H PRN Nira Conn A, NP      . carbamazepine (TEGRETOL) tablet 200 mg  200 mg Oral BID Nira Conn A, NP   200 mg at 11/16/19 1656  . haloperidol (HALDOL) tablet 10 mg  10 mg Oral BID Nira Conn A, NP   10 mg at 11/16/19 1656  . ziprasidone (GEODON) injection 20 mg  20 mg Intramuscular Q12H PRN Jackelyn Poling, NP       And  . LORazepam (ATIVAN) tablet 1 mg  1 mg Oral PRN Nira Conn A, NP      . magnesium hydroxide (MILK OF MAGNESIA) suspension 30 mL  30 mL Oral Daily PRN Nira Conn A, NP      . nicotine polacrilex (NICORETTE) gum 2 mg  2 mg Oral PRN Cobos, Rockey Situ, MD   2 mg at 11/16/19 1316  . risperiDONE (RISPERDAL M-TABS) disintegrating tablet 3 mg  3 mg Oral QHS Nira Conn A, NP   3 mg at 11/16/19 2116    PTA Medications: Medications Prior to Admission  Medication Sig Dispense Refill Last Dose  . carbamazepine (TEGRETOL) 200 MG tablet Take 1 tablet (200 mg total) by mouth 2 (two) times daily. (Patient not taking: Reported on 11/15/2019) 60 tablet 0   . haloperidol (HALDOL) 10 MG tablet Take 1 tablet (10 mg total) by mouth 2 (two) times daily. (Patient not taking: Reported on 11/15/2019) 60 tablet 0   . nicotine polacrilex (NICORETTE) 2 MG gum Take 1 each (2 mg total) by mouth as needed for smoking cessation. (Patient not taking:  Reported on 11/15/2019) 100 tablet 0   . OLANZapine (ZYPREXA) 5 MG tablet Take 5-7.5 mg by mouth See admin instructions. Take 5 mg by mouth in the morning and 7.5 mg at bedtime (Patient not taking: Reported on 11/15/2019)     . risperiDONE (RISPERDAL M-TABS) 3 MG disintegrating tablet Take 1 tablet (3 mg total) by mouth at bedtime. (Patient not taking: Reported on 11/15/2019) 30 tablet 0   . risperiDONE microspheres (RISPERDAL CONSTA) 25 MG injection Inject 2 mLs (25 mg total) into the muscle every 14 (fourteen) days. (Patient not taking: Reported on 11/15/2019) 1 each 0   . traZODone (DESYREL) 50 MG tablet Take 0.5 tablets (25 mg total) by mouth at bedtime. (Patient not taking: Reported on 11/15/2019) 30 tablet 1     Patient Stressors: Health problems Medication change or noncompliance Substance abuse  Patient Strengths: Physical Health Supportive family/friends  Treatment Modalities: Medication Management, Group therapy, Case management,  1 to 1 session with clinician, Psychoeducation, Recreational therapy.   Physician Treatment Plan for Primary Diagnosis: <principal problem not specified> Long Term Goal(s): Improvement in symptoms so as ready for discharge  Short Term Goals: Ability to identify changes in lifestyle to reduce recurrence of condition will improve Ability to verbalize feelings will improve Ability to demonstrate self-control will improve Ability  to identify and develop effective coping behaviors will improve Ability to maintain clinical measurements within normal limits will improve  Medication Management: Evaluate patient's response, side effects, and tolerance of medication regimen.  Therapeutic Interventions: 1 to 1 sessions, Unit Group sessions and Medication administration.  Evaluation of Outcomes: Progressing  Physician Treatment Plan for Secondary Diagnosis: Active Problems:   Schizoaffective disorder (HCC)   Long Term Goal(s): Improvement in symptoms so as  ready for discharge  Short Term Goals: Ability to identify changes in lifestyle to reduce recurrence of condition will improve Ability to verbalize feelings will improve Ability to demonstrate self-control will improve Ability to identify and develop effective coping behaviors will improve Ability to maintain clinical measurements within normal limits will improve  Medication Management: Evaluate patient's response, side effects, and tolerance of medication regimen.  Therapeutic Interventions: 1 to 1 sessions, Unit Group sessions and Medication administration.  Evaluation of Outcomes: Progressing   RN Treatment Plan for Primary Diagnosis: <principal problem not specified> Long Term Goal(s): Knowledge of disease and therapeutic regimen to maintain health will improve  Short Term Goals: Ability to identify and develop effective coping behaviors will improve and Compliance with prescribed medications will improve  Medication Management: RN will administer medications as ordered by provider, will assess and evaluate patient's response and provide education to patient for prescribed medication. RN will report any adverse and/or side effects to prescribing provider.  Therapeutic Interventions: 1 on 1 counseling sessions, Psychoeducation, Medication administration, Evaluate responses to treatment, Monitor vital signs and CBGs as ordered, Perform/monitor CIWA, COWS, AIMS and Fall Risk screenings as ordered, Perform wound care treatments as ordered.  Evaluation of Outcomes: Progressing   LCSW Treatment Plan for Primary Diagnosis: <principal problem not specified> Long Term Goal(s): Safe transition to appropriate next level of care at discharge, Engage patient in therapeutic group addressing interpersonal concerns.  Short Term Goals: Engage patient in aftercare planning with referrals and resources  Therapeutic Interventions: Assess for all discharge needs, 1 to 1 time with Social worker,  Explore available resources and support systems, Assess for adequacy in community support network, Educate family and significant other(s) on suicide prevention, Complete Psychosocial Assessment, Interpersonal group therapy.  Evaluation of Outcomes: Progressing  Patient is referred to [facility] and [facility].   Progress in Treatment: Attending groups: Yes Participating in groups: Yes Taking medication as prescribed: Yes Toleration medication: Yes, no side effects reported at this time Family/Significant other contact made:  Patient understands diagnosis: Yes AEB Discussing patient identified problems/goals with staff: Yes Medical problems stabilized or resolved: Yes Denies suicidal/homicidal ideation: Yes Issues/concerns per patient self-inventory: None Other: N/A  New problem(s) identified: None identified at this time.   New Short Term/Long Term Goal(s): None identified at this time.   Discharge Plan or Barriers:   Reason for Continuation of Hospitalization:  Hallucinations  Medication stabilization   Estimated Length of Stay: 3-5 days  Attendees: Patient: Colucci 11/17/2019  8:10 AM  Physician:Cobos , MD 11/17/2019  8:10 AM  Nursing: Vikki Ports RN 11/17/2019  8:10 AM  Other:   Social Worker: Richelle Ito 11/17/2019  8:10 AM  Recreational Therapist:  11/17/2019  8:10 AM          Scribe for Treatment Team:  Daryel Gerald LCSW 11/17/2019 8:10 AM    Ida Rogue, LCSW 11/17/2019, 8:10 AM

## 2019-11-17 NOTE — [facility] Suicide Risk Assessment (Signed)
[facility] INPATIENT:  Family/Significant Other Suicide Prevention Education  Suicide Prevention Education:  Education Completed; Brendan Hogan, father, [phone] has been identified by the patient as the family member/significant other with whom the patient will be residing, and identified as the person(s) who will aid the patient in the event of a mental health crisis (suicidal ideations/suicide attempt).  With written consent from the patient, the family member/significant other has been provided the following suicide prevention education, prior to the and/or following the discharge of the patient.  The suicide prevention education provided includes the following:  Suicide risk factors  Suicide prevention and interventions  National Suicide Hotline telephone number  [facility] assessment telephone number  [facility] Emergency Assistance 911  [facility] and/or Residential Mobile Crisis Unit telephone number  Request made of family/significant other to:  Remove weapons (e.g., guns, rifles, knives), all items previously/currently identified as safety concern.    Remove drugs/medications (over-the-counter, prescriptions, illicit drugs), all items previously/currently identified as a safety concern.  The family member/significant other verbalizes understanding of the suicide prevention education information provided.  The family member/significant other agrees to remove the items of safety concern listed above.  Brendan Hogan [facility] 11/17/2019, 10:18 AM

## 2019-11-17 NOTE — Clinical Social Work Note (Signed)
Met with parents who produced documentation of guardianship just granted on 11/12/19.  Father, Brendan Hogan, is listed as guardian.  They shared their multiple concerns about their son, all symptoms of Schizophrenia.  They are particularly concerned due to medication non-compliance, and are wanting him to be in "long term hospitalization."  I explained that such a thing does not exist, and assured them we would set him up for services at d/c they will help if he chooses to utilize.  They expressed interest in PSR.
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
## 2019-11-17 NOTE — Plan of Care (Signed)
Progress note  D: pt found in bed; allowed to rest. Upon awakening pt continues to be demanding, attention seeking, and rude to staff. Pt denies any physical complaints or pain. Pt does present anxious and is pacing/fidgety. Pt continues to demand no roommate though verbalization of why can be obtained. Pt to be moved to 502-2. Pt also continues to show little insight into their admission and their care plan once discharged. Pt denies si/hi/ah/vh and verbally agrees to approach staff if these become apparent or before harming themself/others while at [facility].  A: Pt provided support and encouragement. Pt given medication per protocol and standing orders. Q47m safety checks implemented and continued.  R: Pt safe on the unit. Will continue to monitor.  Pt progressing in the following metrics  Problem: Self-Concept: Goal: Level of anxiety will decrease Outcome: Progressing Goal: Ability to modify response to factors that promote anxiety will improve Outcome: Progressing   Problem: Education: Goal: Knowledge of [facility] Education information/materials will improve Outcome: Progressing Goal: Verbalization of understanding the information provided will improve Outcome: Progressing

## 2019-11-17 NOTE — Clinical Social Work Note (Signed)
I called [facility] to try to get appointment for patient when I found out they can work with [employer].  Spoke to [facility] who stated she would call me back to complete intake.

## 2019-11-17 NOTE — Progress Notes (Signed)
   11/17/19 2200  Psych Admission Type (Psych Patients Only)  Admission Status Involuntary  Psychosocial Assessment  Patient Complaints Anxiety;Agitation  Eye Contact Fair  Facial Expression Angry;Animated;Anxious;Pinched  Affect Angry;Anxious;Irritable;Labile  Speech Argumentative;Logical/coherent  Interaction Arrogant;Attention-seeking;Demanding  Motor Activity Fidgety;Restless  Appearance/Hygiene In scrubs  Behavior Characteristics Agressive verbally  Mood Anxious;Labile  Thought Process  Coherency Concrete thinking;Disorganized  Content Blaming others;Paranoia  Delusions Controlled;Paranoid;Persecutory  Perception WDL  Hallucination None reported or observed  Judgment Limited  Confusion None  Danger to Self  Current suicidal ideation? Denies  Danger to Others  Danger to Others None reported or observed
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
## 2019-11-17 NOTE — Progress Notes (Signed)
[facility]  11/17/2019 1:27 PM Brendan Hogan  MRN:  [id number]  Subjective: Brendan Hogan reports, "I don't why I'm here. My parents committed me here. I'm doing wonderfully well. Where is the Dr.?. I need to see him because I'm ready to be discharged. I'm ready to go home".  Objective: [age] y old single male, lives with parents, currently unemployed. He presented to ED on 7/19 under IVC generated by father. In ED exhibited disorganized behavior, pacing, yelling. Family member reported patient had not slept for 2 nights, had not been eating and had had bit father. He was non-compliant with medications. Patient is known to our unit from prior psychiatric admissions in May/2021 and in June 2021. He was also seen in ED on 11/03/19 at which time he received Risperidone Consta injection and was discharged. Most recentl admission was  10/19/19 under IVC generated by parents, for similar presentation(insomnia, psychosis, exhibiting aggression at home).  Brendan Hogan is seen, chart reviewed. The chart findings discussed with the treatment team. He presents calm, but a bit uncooperative in answering the assessment questions. He says he still does not know the reason for his admission only that his parents involuntarily committed him here. He says he is doing wonderfully well. He denies any complaints. He says he is doing wonderfully well. Apparently per the admission notes, patient was admitted to the hospital under an IVC petition by his parents for disorganized & aggressive behaviors, excessive insomnia, not eating & being non-compliant to his treatment regimen. Patient did says during this follow-up care assessment that his medications are making him drowsy. Per documentation, he is sleeping well at night here compared to how much he was sleeping at home. Brendan Hogan is visible on the unit, attending group sessions. He currently denies any SIHI, AVH, delusional thoughts or paranoia. He does however, present guarded,  suspicious & a bit paranoid. Labs reviewed, new lab orers noted, waiting to be drawn. He says he would like to see the doctor for his discharge.     Principal Problem: Schizoaffective disorder (HCC)  Diagnosis: Principal Problem:   Schizoaffective disorder (HCC)  Total Time spent with patient: 25 minutes  Past Psychiatric History: See H&P  Past Medical History:  Past Medical History:  Diagnosis Date  . Psychosis [facility](HCC) 05/2019   [facility]   . Seasonal allergies     Past Surgical History:  Procedure Laterality Date  . TONSILLECTOMY     Family History:  Family History  Problem Relation Age of Onset  . Hypertension Mother    Family Psychiatric  History: See H&P  Social History:  Social History   Substance and Sexual Activity  Alcohol Use Not Currently   Comment: rare     Social History   Substance and Sexual Activity  Drug Use No    Social History   Socioeconomic History  . Marital status: Single    Spouse name: Not on file  . Number of children: Not on file  . Years of education: Not on file  . Highest education level: Not on file  Occupational History  . Not on file  Tobacco Use  . Smoking status: Current Every Day Smoker    Packs/day: 1.00    Types: Cigarettes  . Smokeless tobacco: Former Clinical biochemistUser  Vaping Use  . Vaping Use: Never used  Substance and Sexual Activity  . Alcohol use: Not Currently    Comment: rare  . Drug use: No  . Sexual activity: Not Currently  Other Topics Concern  . Not on file  Social History Narrative  . Not on file   Social Determinants of Health   Financial Resource Strain:   . Difficulty of Paying Living Expenses:   Food Insecurity:   . Worried About Programme researcher, broadcasting/film/video in the Last Year:   . Barista in the Last Year:   Transportation Needs:   . Freight forwarder (Medical):   Marland Kitchen Lack of Transportation (Non-Medical):   Physical Activity:   . Days of Exercise per Week:   . Minutes of Exercise  per Session:   Stress:   . Feeling of Stress :   Social Connections:   . Frequency of Communication with Friends and Family:   . Frequency of Social Gatherings with Friends and Family:   . Attends Religious Services:   . Active Member of Clubs or Organizations:   . Attends Banker Meetings:   Marland Kitchen Marital Status:    Additional Social History:   Sleep: Good  Appetite:  Good  Current Medications: Current Facility-Administered Medications  Medication Dose Route Frequency Provider Last Rate Last Admin  . acetaminophen (TYLENOL) tablet 650 mg  650 mg Oral Q6H PRN Nira Conn A, NP      . alum & mag hydroxide-simeth (MAALOX/MYLANTA) 200-200-20 MG/5ML suspension 30 mL  30 mL Oral Q4H PRN Nira Conn A, NP      . carbamazepine (TEGRETOL) tablet 200 mg  200 mg Oral BID Nira Conn A, NP   200 mg at 11/17/19 1610  . haloperidol (HALDOL) tablet 10 mg  10 mg Oral BID Nira Conn A, NP   10 mg at 11/17/19 9604  . ziprasidone (GEODON) injection 20 mg  20 mg Intramuscular Q12H PRN Jackelyn Poling, NP       And  . LORazepam (ATIVAN) tablet 1 mg  1 mg Oral PRN Nira Conn A, NP      . magnesium hydroxide (MILK OF MAGNESIA) suspension 30 mL  30 mL Oral Daily PRN Nira Conn A, NP      . nicotine polacrilex (NICORETTE) gum 2 mg  2 mg Oral PRN Cobos, Rockey Situ, MD   2 mg at 11/17/19 1305  . risperiDONE (RISPERDAL M-TABS) disintegrating tablet 3 mg  3 mg Oral QHS Nira Conn A, NP   3 mg at 11/16/19 2116   Lab Results:  Results for orders placed or performed during the hospital encounter of 11/15/19 (from the past 48 hour(s))  Urinalysis, Complete w Microscopic     Status: Abnormal   Collection Time: 11/15/19  2:44 PM  Result Value Ref Range   Color, Urine STRAW (A) YELLOW   APPearance CLEAR CLEAR   Specific Gravity, Urine 1.004 (L) 1.005 - 1.030   pH 6.0 5.0 - 8.0   Glucose, UA NEGATIVE NEGATIVE mg/dL   Hgb urine dipstick SMALL (A) NEGATIVE   Bilirubin Urine NEGATIVE NEGATIVE    Ketones, ur NEGATIVE NEGATIVE mg/dL   Protein, ur NEGATIVE NEGATIVE mg/dL   Nitrite NEGATIVE NEGATIVE   Leukocytes,Ua NEGATIVE NEGATIVE   RBC / HPF 0-5 0 - 5 RBC/hpf   WBC, UA 0-5 0 - 5 WBC/hpf   Bacteria, UA NONE SEEN NONE SEEN    Comment: Performed at [facility], [address] [place], [place] [address]  Comprehensive metabolic panel     Status: Abnormal   Collection Time: 11/15/19  2:47 PM  Result Value Ref Range   Sodium 138 135 -  145 mmol/L   Potassium 3.6 3.5 - 5.1 mmol/L   Chloride 102 98 - 111 mmol/L   CO2 22 22 - 32 mmol/L   Glucose, Bld 110 (H) 70 - 99 mg/dL    Comment: Glucose reference range applies only to samples taken after fasting for at least 8 hours.   BUN 9 6 - 20 mg/dL   Creatinine, Ser 2.70 0.61 - 1.24 mg/dL   Calcium 9.4 8.9 - 62.3 mg/dL   Total Protein 7.2 6.5 - 8.1 g/dL   Albumin 4.7 3.5 - 5.0 g/dL   AST 27 15 - 41 U/L   ALT 16 0 - 44 U/L   Alkaline Phosphatase 104 38 - 126 U/L   Total Bilirubin 1.2 0.3 - 1.2 mg/dL   GFR calc non Af Amer >60 >60 mL/min   GFR calc Af Amer >60 >60 mL/min   Anion gap 14 5 - 15    Comment: Performed at [facility], [address], [place], [place] [address]  Ethanol     Status: Abnormal   Collection Time: 11/15/19  2:47 PM  Result Value Ref Range   Alcohol, Ethyl (B) 50 (H) <10 mg/dL    Comment: (NOTE) Lowest detectable limit for serum alcohol is 10 mg/dL.  For medical purposes only. Performed at [name], [address] [place], [place] [address]   CBC with Diff     Status: None   Collection Time: 11/15/19  2:47 PM  Result Value Ref Range   WBC 4.6 4.0 - 10.5 K/uL   RBC 5.05 4.22 - 5.81 MIL/uL   Hemoglobin 14.7 13.0 - 17.0 g/dL   HCT 16.0 39 - 52 %   MCV 86.7 80.0 - 100.0 fL   MCH 29.1 26.0 - 34.0 pg   MCHC 33.6 30.0 - 36.0 g/dL   RDW 73.7 10.6 - 26.9 %   Platelets 209 150 - 400 K/uL   nRBC 0.0 0.0 - 0.2 %   Neutrophils Relative % 74 %   Neutro Abs 3.4 1.7 - 7.7 K/uL    Lymphocytes Relative 16 %   Lymphs Abs 0.7 0.7 - 4.0 K/uL   Monocytes Relative 10 %   Monocytes Absolute 0.5 0 - 1 K/uL   Eosinophils Relative 0 %   Eosinophils Absolute 0.0 0 - 0 K/uL   Basophils Relative 0 %   Basophils Absolute 0.0 0 - 0 K/uL   Immature Granulocytes 0 %   Abs Immature Granulocytes 0.01 0.00 - 0.07 K/uL    Comment: Performed at [facility], [address] [place], [place] [address]  SARS Coronavirus 2 by RT PCR (hospital order, performed in [facility] hospital lab) Nasopharyngeal Nasopharyngeal Swab     Status: None   Collection Time: 11/15/19  3:04 PM   Specimen: Nasopharyngeal Swab  Result Value Ref Range   SARS Coronavirus 2 NEGATIVE NEGATIVE    Comment: (NOTE) SARS-CoV-2 target nucleic acids are NOT DETECTED.  The SARS-CoV-2 RNA is generally detectable in upper and lower respiratory specimens during the acute phase of infection. The lowest concentration of SARS-CoV-2 viral copies this assay can detect is 250 copies / mL. A negative result does not preclude SARS-CoV-2 infection and should not be used as the sole basis for treatment or other patient management decisions.  A negative result may occur with improper specimen collection / handling, submission of specimen other than nasopharyngeal swab, presence of viral mutation(s) within the areas targeted by this  assay, and inadequate number of viral copies (<250 copies / mL). A negative result must be combined with clinical observations, patient history, and epidemiological information.  Fact Sheet for Patients:   BoilerBrush.com.cy  Fact Sheet for Healthcare Providers: https://pope.com/  This test is not yet approved or  cleared by the [place] FDA and has been authorized for detection and/or diagnosis of SARS-CoV-2 by FDA under an Emergency Use Authorization (EUA).  This EUA will remain in effect (meaning this test can be used) for the duration  of the COVID-19 declaration under Section 564(b)(1) of the Act, 21 U.S.C. section 360bbb-3(b)(1), unless the authorization is terminated or revoked sooner.  Performed at [facility], [address], [place], [place] [address]    Blood Alcohol level:  Lab Results  Component Value Date   ETH 50 (H) 11/15/2019   ETH <10 10/18/2019   Metabolic Disorder Labs: Lab Results  Component Value Date   HGBA1C 5.0 09/08/2019   MPG 96.8 09/08/2019   Lab Results  Component Value Date   PROLACTIN 26.5 (H) 09/08/2019   Lab Results  Component Value Date   CHOL 159 09/08/2019   TRIG 68 09/08/2019   HDL 53 09/08/2019   CHOLHDL 3.0 09/08/2019   VLDL 14 09/08/2019   LDLCALC 92 09/08/2019   Physical Findings: AIMS: Facial and Oral Movements Muscles of Facial Expression: None, normal Lips and Perioral Area: None, normal Jaw: None, normal Tongue: None, normal,Extremity Movements Upper (arms, wrists, hands, fingers): None, normal Lower (legs, knees, ankles, toes): None, normal, Trunk Movements Neck, shoulders, hips: None, normal, Overall Severity Severity of abnormal movements (highest score from questions above): None, normal Incapacitation due to abnormal movements: None, normal Patient's awareness of abnormal movements (rate only patient's report): No Awareness, Dental Status Current problems with teeth and/or dentures?: No Does patient usually wear dentures?: No  CIWA:    COWS:     Musculoskeletal: Strength & Muscle Tone: within normal limits Gait & Station: normal Patient leans: N/A  Psychiatric Specialty Exam: Physical Exam Vitals and nursing note reviewed.  HENT:     Head: Normocephalic.     Nose: Nose normal.     Mouth/Throat:     Pharynx: Oropharynx is clear.  Cardiovascular:     Rate and Rhythm: Normal rate.     Pulses: Normal pulses.  Pulmonary:     Effort: Pulmonary effort is normal.  Genitourinary:    Comments: Deferred Musculoskeletal:        General:  Normal range of motion.     Cervical back: Normal range of motion.  Skin:    General: Skin is warm and dry.  Neurological:     Mental Status: He is alert and oriented to person, place, and time.     Review of Systems  Constitutional: Negative for chills, diaphoresis and fever.  HENT: Negative for congestion, rhinorrhea, sneezing and sore throat.   Eyes: Negative for discharge.  Respiratory: Negative for cough, chest tightness, shortness of breath and wheezing.   Cardiovascular: Negative for chest pain and palpitations.  Gastrointestinal: Negative for diarrhea, nausea and vomiting.  Endocrine: Negative for cold intolerance.  Genitourinary: Negative for difficulty urinating.  Musculoskeletal: Negative for arthralgias and myalgias.  Skin: Negative.   Allergic/Immunologic:       Allergies: NKDA  Neurological: Negative for dizziness, tremors, seizures, syncope, numbness and headaches.  Psychiatric/Behavioral: Positive for dysphoric mood. Negative for agitation, behavioral problems, confusion, decreased concentration, hallucinations (patient currently denies), sleep disturbance and suicidal ideas. The patient  is not nervous/anxious and is not hyperactive.     Blood pressure 110/70, pulse (!) 106, temperature 98.5 F (36.9 C), temperature source Oral, resp. rate 18, height 5\' 7"  (1.702 m), weight 56.7 kg, SpO2 100 %.Body mass index is 19.58 kg/m.  General Appearance: Casual  Eye Contact:  Minimal  Speech:  Slow  Volume:  Decreased  Mood:  Dysphoric  Affect:  Congruent  Thought Process:  Goal Directed and Descriptions of Associations: Circumstantial  Orientation:  Negative  Thought Content:  Delusions, Hallucinations: Auditory and Paranoid Ideation  Suicidal Thoughts:  No  Homicidal Thoughts:  No  Memory:  Immediate;   Poor Recent;   Poor Remote;   Poor  Judgement:  Impaired  Insight:  Lacking  Psychomotor Activity:  Decreased  Concentration:  Concentration: Fair and Attention  Span: Fair  Recall:  of Knowledge:  Fair  Language:  Fair  Akathisia:  Negative  Handed:  Right  AIMS (if indicated):     Assets:  Desire for Improvement Housing Resiliencecogentin  ADL's:  Intact  Cognition:  WNL    Sleep:  Number of Hours: 9.5   Treatment Plan Summary: Daily contact with patient to assess and evaluate symptoms and progress in treatment and Medication management.  - Continue inpatient hospitalization. - Will continue today 11/17/2019 plan as below except where it is noted.  Mood control.    - Continue Haldol 10 mg po bid.    - Initiated Cogentin 0.5 mg po bid for EPS prevention.    - Continue Risperdal M-tab po Q bedtime.  Mood stabilization.    - Continue tegretol 200 mg po bid.  Agitation/psychosis protocols.    - Continue Geodon 20 mg po IM Q 12 hrs prn.       &     - Lorazepam 1 mg po prn x 1 dose.  - Continue Nicorette gum 2 mg prn for nicotine withdrawal symptom. - Continue Mylanta 30 ml po Q 4 hrs prn for indigestion. - Encourage group session attendance & participation. - Discharge disposition in progress.  11/19/2019, NP, PMHNP, FNP-BC 11/17/2019, 1:27 PM

## 2019-11-18 DIAGNOSIS — F250 Schizoaffective disorder, bipolar type: Secondary | ICD-10-CM

## 2019-11-18 NOTE — Progress Notes (Signed)
[facility] MD Progress Note  11/18/2019 2:22 PM DRAYKE GRABEL  MRN:  [id number]  Subjective: Gervis reports, "I'm doing wonderful. When am I getting discharged? I feel ready to go home".  Objective: 24 y old single male, lives with parents, currently unemployed. He presented to ED on 7/19 under IVC generated by father. In ED exhibited disorganized behavior, pacing, yelling. Family member reported patient had not slept for 2 nights, had not been eating and had had bit father. He was non-compliant with medications. Patient is known to our unit from prior psychiatric admissions in May/2021 and in June 2021. He was also seen in ED on 11/03/19 at which time he received Risperidone Consta injection and was discharged. Most recentl admission was  10/19/19 under IVC generated by parents, for similar presentation(insomnia, psychosis, exhibiting aggression at home).  Aubrey is seen, chart reviewed. The chart findings discussed with the treatment team. He presents calm & cooperative in answering the assessment questions. He maintained that he still does not know the reason for his admission only that his parents involuntarily committed him here. He says he is doing wonderfully well. He denies any complaints. Apparently per the admission notes, patient was admitted to the hospital under an IVC petition by his parents for disorganized & aggressive behaviors, excessive insomnia, not eating & being non-compliant to his treatment regimen. Patient did says again during this follow-up care assessment that his medications are making him drowsy. But, he presents awake & alert during this follow-up care interview. Per documentation, he is sleeping well at night here compared to how much he was sleeping at home. Menashe is visible on the unit, attending group sessions. He currently denies any SIHI, AVH, delusional thoughts or paranoia. He does however, present guarded, suspicious & a bit paranoid. Labs reviewed, new lab orers noted,  waiting to be drawn. He says he would like to see the doctor for his discharge.     Principal Problem: Schizoaffective disorder (HCC)  Diagnosis: Principal Problem:   Schizoaffective disorder (HCC)  Total Time spent with patient: 15  Past Psychiatric History: See H&P  Past Medical History:  Past Medical History:  Diagnosis Date  . Psychosis [facility]) 05/2019   [facility] [place]   . Seasonal allergies     Past Surgical History:  Procedure Laterality Date  . TONSILLECTOMY     Family History:  Family History  Problem Relation Age of Onset  . Hypertension Mother    Family Psychiatric  History: See H&P  Social History:  Social History   Substance and Sexual Activity  Alcohol Use Not Currently   Comment: rare     Social History   Substance and Sexual Activity  Drug Use No    Social History   Socioeconomic History  . Marital status: Single    Spouse name: Not on file  . Number of children: Not on file  . Years of education: Not on file  . Highest education level: Not on file  Occupational History  . Not on file  Tobacco Use  . Smoking status: Current Every Day Smoker    Packs/day: 1.00    Types: Cigarettes  . Smokeless tobacco: Former Clinical biochemist  . Vaping Use: Never used  Substance and Sexual Activity  . Alcohol use: Not Currently    Comment: rare  . Drug use: No  . Sexual activity: Not Currently  Other Topics Concern  . Not on file  Social History Narrative  . Not on  file   Social Determinants of Health   Financial Resource Strain:   . Difficulty of Paying Living Expenses:   Food Insecurity:   . Worried About Programme researcher, broadcasting/film/video in the Last Year:   . Barista in the Last Year:   Transportation Needs:   . Freight forwarder (Medical):   Marland Kitchen Lack of Transportation (Non-Medical):   Physical Activity:   . Days of Exercise per Week:   . Minutes of Exercise per Session:   Stress:   . Feeling of Stress :   Social Connections:    . Frequency of Communication with Friends and Family:   . Frequency of Social Gatherings with Friends and Family:   . Attends Religious Services:   . Active Member of Clubs or Organizations:   . Attends Banker Meetings:   Marland Kitchen Marital Status:    Additional Social History:   Sleep: Good  Appetite:  Good  Current Medications: Current Facility-Administered Medications  Medication Dose Route Frequency Provider Last Rate Last Admin  . acetaminophen (TYLENOL) tablet 650 mg  650 mg Oral Q6H PRN Jackelyn Poling, NP      . alum & mag hydroxide-simeth (MAALOX/MYLANTA) 200-200-20 MG/5ML suspension 30 mL  30 mL Oral Q4H PRN Nira Conn A, NP      . benztropine (COGENTIN) tablet 0.5 mg  0.5 mg Oral BID Armandina Stammer I, NP   0.5 mg at 11/18/19 0944  . carbamazepine (TEGRETOL) tablet 200 mg  200 mg Oral BID Nira Conn A, NP   200 mg at 11/18/19 0944  . haloperidol (HALDOL) tablet 10 mg  10 mg Oral BID Nira Conn A, NP   10 mg at 11/18/19 0944  . ziprasidone (GEODON) injection 20 mg  20 mg Intramuscular Q12H PRN Jackelyn Poling, NP       And  . LORazepam (ATIVAN) tablet 1 mg  1 mg Oral PRN Nira Conn A, NP      . magnesium hydroxide (MILK OF MAGNESIA) suspension 30 mL  30 mL Oral Daily PRN Nira Conn A, NP      . nicotine polacrilex (NICORETTE) gum 2 mg  2 mg Oral PRN Cobos, Rockey Situ, MD   2 mg at 11/18/19 1331  . risperiDONE (RISPERDAL M-TABS) disintegrating tablet 3 mg  3 mg Oral QHS Nira Conn A, NP   3 mg at 11/16/19 2116   Lab Results:  No results found for this or any previous visit (from the past 48 hour(s)). Blood Alcohol level:  Lab Results  Component Value Date   ETH 50 (H) 11/15/2019   ETH <10 10/18/2019   Metabolic Disorder Labs: Lab Results  Component Value Date   HGBA1C 5.0 09/08/2019   MPG 96.8 09/08/2019   Lab Results  Component Value Date   PROLACTIN 26.5 (H) 09/08/2019   Lab Results  Component Value Date   CHOL 159 09/08/2019   TRIG 68  09/08/2019   HDL 53 09/08/2019   CHOLHDL 3.0 09/08/2019   VLDL 14 09/08/2019   LDLCALC 92 09/08/2019   Physical Findings: AIMS: Facial and Oral Movements Muscles of Facial Expression: None, normal Lips and Perioral Area: None, normal Jaw: None, normal Tongue: None, normal,Extremity Movements Upper (arms, wrists, hands, fingers): None, normal Lower (legs, knees, ankles, toes): None, normal, Trunk Movements Neck, shoulders, hips: None, normal, Overall Severity Severity of abnormal movements (highest score from questions above): None, normal Incapacitation due to abnormal movements: None, normal Patient's awareness of abnormal  movements (rate only patient's report): No Awareness, Dental Status Current problems with teeth and/or dentures?: No Does patient usually wear dentures?: No  CIWA:    COWS:     Musculoskeletal: Strength & Muscle Tone: within normal limits Gait & Station: normal Patient leans: N/A  Psychiatric Specialty Exam: Physical Exam Vitals and nursing note reviewed.  HENT:     Head: Normocephalic.     Nose: Nose normal.     Mouth/Throat:     Pharynx: Oropharynx is clear.  Cardiovascular:     Rate and Rhythm: Normal rate.     Pulses: Normal pulses.  Pulmonary:     Effort: Pulmonary effort is normal.  Genitourinary:    Comments: Deferred Musculoskeletal:        General: Normal range of motion.     Cervical back: Normal range of motion.  Skin:    General: Skin is warm and dry.  Neurological:     Mental Status: He is alert and oriented to person, place, and time.     Review of Systems  Constitutional: Negative for chills, diaphoresis and fever.  HENT: Negative for congestion, rhinorrhea, sneezing and sore throat.   Eyes: Negative for discharge.  Respiratory: Negative for cough, chest tightness, shortness of breath and wheezing.   Cardiovascular: Negative for chest pain and palpitations.  Gastrointestinal: Negative for diarrhea, nausea and vomiting.   Endocrine: Negative for cold intolerance.  Genitourinary: Negative for difficulty urinating.  Musculoskeletal: Negative for arthralgias and myalgias.  Skin: Negative.   Allergic/Immunologic:       Allergies: NKDA  Neurological: Negative for dizziness, tremors, seizures, syncope, numbness and headaches.  Psychiatric/Behavioral: Positive for dysphoric mood. Negative for agitation, behavioral problems, confusion, decreased concentration, hallucinations (patient currently denies), sleep disturbance and suicidal ideas. The patient is not nervous/anxious and is not hyperactive.     Blood pressure 110/70, pulse (!) 106, temperature 98.5 F (36.9 C), temperature source Oral, resp. rate 18, height 5\' 7"  (1.702 m), weight 56.7 kg, SpO2 100 %.Body mass index is 19.58 kg/m.  General Appearance: Casual  Eye Contact:  Minimal  Speech:  Slow  Volume:  Decreased  Mood:  Dysphoric  Affect:  Congruent  Thought Process:  Goal Directed and Descriptions of Associations: Circumstantial  Orientation:  Negative  Thought Content:  Delusions, Hallucinations: Auditory and Paranoid Ideation  Suicidal Thoughts:  No  Homicidal Thoughts:  No  Memory:  Immediate;   Poor Recent;   Poor Remote;   Poor  Judgement:  Impaired  Insight:  Lacking  Psychomotor Activity:  Decreased  Concentration:  Concentration: Fair and Attention Span: Fair  Recall:  of Knowledge:  Fair  Language:  Fair  Akathisia:  Negative  Handed:  Right  AIMS (if indicated):     Assets:  Desire for Improvement Housing Resiliencecogentin  ADL's:  Intact  Cognition:  WNL    Sleep:  Number of Hours: 9   Treatment Plan Summary: Daily contact with patient to assess and evaluate symptoms and progress in treatment and Medication management.  - Continue inpatient hospitalization. - Will continue today 11/18/2019 plan as below except where it is noted.  Mood control.    - Continue Haldol 10 mg po bid.    - Continue Cogentin 0.5  mg po bid for EPS prevention.    - Continue Risperdal M-tab 3 mg po Q bedtime.  Mood stabilization.    - Continue tegretol 200 mg po bid.  Agitation/psychosis protocols.    - Continue Geodon 20  mg po IM Q 12 hrs prn.       &     - Lorazepam 1 mg po prn x 1 dose.  - Continue Nicorette gum 2 mg prn for nicotine withdrawal symptom. - Continue Mylanta 30 ml po Q 4 hrs prn for indigestion. - Encourage group session attendance & participation. - Discharge disposition in progress.  Armandina StammerAgnes Liller Yohn, NP, PMHNP, FNP-BC 11/18/2019, 2:22 PMPatient ID: Shelly CossMustafa O Boehme, male   DOB: 08/06/1995, 24 y.o.   MRN: [id number]

## 2019-11-18 NOTE — [facility] Group Notes (Signed)
Occupational Therapy Group Note Date: 11/18/2019 Group Topic/Focus: Socialization/Social Skills  Group Description: Group encouraged increased engagement and participation through discussion/interactive activity focused on socialization and communication. Group members picked from a stack of topics and were challenged to toss a stress ball around in a complete circle while offering a relevant response to identified topic without repeating any answers from peers. Activity focused on use of problem-solving, communication, and social skills.  Participation Level: Active   Participation Quality: Minimal Cues   Behavior: Alert, Cooperative and Interactive   Speech/Thought Process: Disorganized   Affect/Mood: Full range   Insight: Limited   Judgement: Limited   Individualization: Brendan Hogan was active in his participation of discussion/activity, though notably distracted by the actions of peers. With min cues for verbal redirection, pt was able to re-focus back to task and identified relevant contributions to discussion. Pt was observed to laugh out loud inappropriately, though at times appeared to be directed at peers' behaviors, other times appearing to be responding to internal stimuli. Pt remained pleasant and cooperative throughout.   Modes of Intervention: Activity, Discussion, Education and Socialization  Patient Response to Interventions:  Attentive and Engaged   Plan: Continue to engage patient in OT groups 2 - 3x/week.  Donne Hazel, MOT, OTR/L

## 2019-11-18 NOTE — Progress Notes (Signed)
Recreation Therapy Notes  Date: 7.22.21 Time: 1015 Location: [address] Dayroom  Group Topic: Triggers  Goal Area(s) Addresses:  Patient will identify biggest triggers. Patient will identify how to avoid triggers. Patient will identify how to deal with triggers head on.  Behavioral Response: None  Intervention: Worksheet, pencils  Activity: Triggers.  Patients were to identify their 3 biggest triggers.  Patients were to then identify three ways in which those triggers could be avoided and three ways they can be dealt with head on.  Education: Triggers, Discharge Planning  Education Outcome: Acknowledges understanding/In group clarification offered/Needs additional education.   Clinical Observations/Feedback: Pt came into group late.  Pt did not participate.  Pt was smiling and talking about a saying in the [place] ron that spoke about smiling.    Caroll Rancher, LRT/CTRS         Lillia Abed, Christo Hain A 11/18/2019 11:07 AM

## 2019-11-18 NOTE — Progress Notes (Signed)
   11/18/19 2300  Psych Admission Type (Psych Patients Only)  Admission Status Involuntary  Psychosocial Assessment  Eye Contact Fair  Facial Expression Angry;Animated;Anxious;Pinched  Affect Angry;Anxious;Irritable;Labile  Speech Argumentative;Logical/coherent  Interaction Arrogant;Attention-seeking;Demanding  Motor Activity Fidgety;Restless  Appearance/Hygiene In scrubs  Behavior Characteristics Cooperative  Mood Anxious  Thought Process  Coherency Concrete thinking;Disorganized  Content Blaming others;Paranoia  Delusions Controlled;Paranoid;Persecutory  Perception WDL  Hallucination None reported or observed  Judgment Limited  Confusion None  Danger to Self  Current suicidal ideation? Denies  Danger to Others  Danger to Others None reported or observed
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
## 2019-11-19 NOTE — Tx Team (Cosign Needed)
Interdisciplinary Treatment and Diagnostic Plan Update  11/19/2019 Time of Session: 9:05am Brendan Hogan MRN: [id number]  Principal Diagnosis: Schizoaffective disorder [facility])  Secondary Diagnoses: Principal Problem:   Schizoaffective disorder ([place])   Current Medications:  Current Facility-Administered Medications  Medication Dose Route Frequency Provider Last Rate Last Admin  . acetaminophen (TYLENOL) tablet 650 mg  650 mg Oral Q6H PRN Rozetta Nunnery, NP      . alum & mag hydroxide-simeth (MAALOX/MYLANTA) 200-200-20 MG/5ML suspension 30 mL  30 mL Oral Q4H PRN Lindon Romp A, NP      . benztropine (COGENTIN) tablet 0.5 mg  0.5 mg Oral BID Lindell Spar I, NP   0.5 mg at 11/18/19 1704  . carbamazepine (TEGRETOL) tablet 200 mg  200 mg Oral BID Lindon Romp A, NP   200 mg at 11/18/19 1704  . haloperidol (HALDOL) tablet 10 mg  10 mg Oral BID Lindon Romp A, NP   10 mg at 11/18/19 1704  . ziprasidone (GEODON) injection 20 mg  20 mg Intramuscular Q12H PRN Rozetta Nunnery, NP       And  . LORazepam (ATIVAN) tablet 1 mg  1 mg Oral PRN Lindon Romp A, NP      . magnesium hydroxide (MILK OF MAGNESIA) suspension 30 mL  30 mL Oral Daily PRN Lindon Romp A, NP      . nicotine polacrilex (NICORETTE) gum 2 mg  2 mg Oral PRN Cobos, Myer Peer, MD   2 mg at 11/18/19 2139  . risperiDONE (RISPERDAL M-TABS) disintegrating tablet 3 mg  3 mg Oral QHS Lindon Romp A, NP   3 mg at 11/18/19 2042   PTA Medications: Medications Prior to Admission  Medication Sig Dispense Refill Last Dose  . carbamazepine (TEGRETOL) 200 MG tablet Take 1 tablet (200 mg total) by mouth 2 (two) times daily. (Patient not taking: Reported on 11/15/2019) 60 tablet 0   . haloperidol (HALDOL) 10 MG tablet Take 1 tablet (10 mg total) by mouth 2 (two) times daily. (Patient not taking: Reported on 11/15/2019) 60 tablet 0   . nicotine polacrilex (NICORETTE) 2 MG gum Take 1 each (2 mg total) by mouth as needed for smoking cessation. (Patient not  taking: Reported on 11/15/2019) 100 tablet 0   . OLANZapine (ZYPREXA) 5 MG tablet Take 5-7.5 mg by mouth See admin instructions. Take 5 mg by mouth in the morning and 7.5 mg at bedtime (Patient not taking: Reported on 11/15/2019)     . risperiDONE (RISPERDAL M-TABS) 3 MG disintegrating tablet Take 1 tablet (3 mg total) by mouth at bedtime. (Patient not taking: Reported on 11/15/2019) 30 tablet 0   . risperiDONE microspheres (RISPERDAL CONSTA) 25 MG injection Inject 2 mLs (25 mg total) into the muscle every 14 (fourteen) days. (Patient not taking: Reported on 11/15/2019) 1 each 0   . traZODone (DESYREL) 50 MG tablet Take 0.5 tablets (25 mg total) by mouth at bedtime. (Patient not taking: Reported on 11/15/2019) 30 tablet 1     Patient Stressors: Health problems Medication change or noncompliance Substance abuse  Patient Strengths: Physical Health Supportive family/friends  Treatment Modalities: Medication Management, Group therapy, Case management,  1 to 1 session with clinician, Psychoeducation, Recreational therapy.   Physician Treatment Plan for Primary Diagnosis: Schizoaffective disorder [facility]) Long Term Goal(s): Improvement in symptoms so as ready for discharge Improvement in symptoms so as ready for discharge   Short Term Goals: Ability to identify changes in lifestyle to reduce recurrence of condition will  improve Ability to verbalize feelings will improve Ability to demonstrate self-control will improve Ability to identify and develop effective coping behaviors will improve Ability to maintain clinical measurements within normal limits will improve  Medication Management: Evaluate patient's response, side effects, and tolerance of medication regimen.  Therapeutic Interventions: 1 to 1 sessions, Unit Group sessions and Medication administration.  Evaluation of Outcomes: Not Met  Physician Treatment Plan for Secondary Diagnosis: Principal Problem:   Schizoaffective disorder  ([place])  Long Term Goal(s): Improvement in symptoms so as ready for discharge Improvement in symptoms so as ready for discharge   Short Term Goals: Ability to identify changes in lifestyle to reduce recurrence of condition will improve Ability to verbalize feelings will improve Ability to demonstrate self-control will improve Ability to identify and develop effective coping behaviors will improve Ability to maintain clinical measurements within normal limits will improve     Medication Management: Evaluate patient's response, side effects, and tolerance of medication regimen.  Therapeutic Interventions: 1 to 1 sessions, Unit Group sessions and Medication administration.  Evaluation of Outcomes: Not Met   RN Treatment Plan for Primary Diagnosis: Schizoaffective disorder ([place]) Long Term Goal(s): Knowledge of disease and therapeutic regimen to maintain health will improve  Short Term Goals: Ability to remain free from injury will improve, Ability to demonstrate self-control, Ability to identify and develop effective coping behaviors will improve and Compliance with prescribed medications will improve  Medication Management: RN will administer medications as ordered by provider, will assess and evaluate patient's response and provide education to patient for prescribed medication. RN will report any adverse and/or side effects to prescribing provider.  Therapeutic Interventions: 1 on 1 counseling sessions, Psychoeducation, Medication administration, Evaluate responses to treatment, Monitor vital signs and CBGs as ordered, Perform/monitor CIWA, COWS, AIMS and Fall Risk screenings as ordered, Perform wound care treatments as ordered.  Evaluation of Outcomes: Not Met   LCSW Treatment Plan for Primary Diagnosis: Schizoaffective disorder ([place]) Long Term Goal(s): Safe transition to appropriate next level of care at discharge, Engage patient in therapeutic group addressing interpersonal  concerns.  Short Term Goals: Engage patient in aftercare planning with referrals and resources, Increase social support, Facilitate acceptance of mental health diagnosis and concerns, Identify triggers associated with mental health/substance abuse issues and Increase skills for wellness and recovery  Therapeutic Interventions: Assess for all discharge needs, 1 to 1 time with Social worker, Explore available resources and support systems, Assess for adequacy in community support network, Educate family and significant other(s) on suicide prevention, Complete Psychosocial Assessment, Interpersonal group therapy.  Evaluation of Outcomes: Not Met   Progress in Treatment: Attending groups: Yes. Participating in groups: Yes. Taking medication as prescribed: Yes. Toleration medication: Yes. Family/Significant other contact made: Yes, individual(s) contacted:  father. Patient understands diagnosis: No. Discussing patient identified problems/goals with staff: No. Medical problems stabilized or resolved: Yes. Denies suicidal/homicidal ideation: Yes. Issues/concerns per patient self-inventory: No.   New problem(s) identified: No, Describe:  none.  New Short Term/Long Term Goal(s): medication stabilization, elimination of SI thoughts, development of comprehensive mental wellness plan.   Patient Goals:  Patient did not attend.   Discharge Plan or Barriers:   Reason for Continuation of Hospitalization: Delusions  [place]ia Medication stabilization  Estimated Length of Stay: 3-5 days  Attendees: Patient: Did not attend 11/19/2019  Physician:  11/19/2019   Nursing:  11/19/2019  RN Care Manager: 11/19/2019   Social Worker: Darletta Moll, LCSW 11/19/2019   Recreational Therapist:  11/19/2019   Other:  11/19/2019  Other:  11/19/2019   Other: 11/19/2019       Scribe for Treatment Team: Vassie Moselle, LCSW 11/19/2019 9:21 AM

## 2019-11-19 NOTE — Progress Notes (Signed)
SPIRITUALITY GROUP NOTE  Pt attended spirituality group facilitated by Wilkie Aye, MDIv, BCC.  Group Description:  Group focused on topic of hope.  Patients participated in facilitated discussion around topic, connecting with one another around experiences and definitions for hope.  Group members engaged with visual explorer photos, reflecting on what hope looks like for them today.  Group engaged in discussion around how their definitions of hope are present today in hospital.   Modalities: Psycho-social ed, Adlerian, Narrative, MI  Patient Progress: Brendan Hogan was present throughout group.  Limited engagement, Brendan Hogan stated that smiling is an indication of his hope.

## 2019-11-19 NOTE — Progress Notes (Signed)
   11/19/19 1200  Psych Admission Type (Psych Patients Only)  Admission Status Involuntary  Psychosocial Assessment  Patient Complaints Anxiety  Eye Contact Fair  Facial Expression Angry;Animated;Anxious;Pinched  Affect Angry;Anxious;Irritable;Labile  Speech Argumentative;Logical/coherent  Interaction Arrogant;Attention-seeking;Demanding  Motor Activity Fidgety;Restless  Appearance/Hygiene In scrubs  Behavior Characteristics Cooperative  Mood Anxious  Thought Process  Coherency Concrete thinking;Disorganized  Content Blaming others;Paranoia  Delusions Controlled;Paranoid;Persecutory  Perception WDL  Hallucination None reported or observed  Judgment Limited  Confusion None  Danger to Self  Current suicidal ideation? Denies  Danger to Others  Danger to Others None reported or observed

## 2019-11-20 LAB — CARBAMAZEPINE LEVEL, TOTAL: Carbamazepine Lvl: 2.5 ug/mL — ABNORMAL LOW (ref 4.0–12.0)

## 2019-11-20 MED ORDER — CARBAMAZEPINE 200 MG PO TABS
300.0000 mg | ORAL_TABLET | Freq: Every day | ORAL | Status: DC
  Administered 2019-11-20 – 2019-11-21 (×2): 300 mg via ORAL
  Filled 2019-11-20: qty 1.5
  Filled 2019-11-20: qty 2
  Filled 2019-11-20: qty 11
  Filled 2019-11-20: qty 2
  Filled 2019-11-20 (×2): qty 1.5

## 2019-11-20 MED ORDER — CARBAMAZEPINE 200 MG PO TABS
200.0000 mg | ORAL_TABLET | ORAL | Status: DC
  Administered 2019-11-21 – 2019-11-22 (×2): 200 mg via ORAL
  Filled 2019-11-20: qty 7
  Filled 2019-11-20 (×4): qty 1

## 2019-11-20 MED ORDER — RISPERIDONE 2 MG PO TBDP: 3.0000 mg | ORAL_TABLET | Freq: Two times a day (BID) | ORAL | Status: DC

## 2019-11-20 NOTE — B[facility] Group Notes (Signed)
Adult Psychoeducational Group Note  Date:  11/20/2019 Time:  12:51 PM  Group Topic/Focus:  Goals Group:   The focus of this group is to help patients establish daily goals to achieve during treatment and discuss how the patient can incorporate goal setting into their daily lives to aide in recovery.  Participation Level:  Did Not Attend  [name] 11/20/2019, 12:51 PM

## 2019-11-20 NOTE — Progress Notes (Signed)
Pt's parents picked up a tan blanket and a tan towel that was with belongings.

## 2019-11-20 NOTE — Progress Notes (Signed)
D. Pt presents with an anxious affect/ mood- friendly upon approach. Pt  has been calm and cooperative, and visible in the dayroom attending groups this am. Per pt's self inventory, pt rated his depression, hopelessness and anxiety all 0's. Pt writes that his goal today is "to make it through the day without any problems" , and writes that he will "stay to myself" to help him to meet that goal. Pt currently denies SI/HI and AVH   A. Labs and vitals monitored. Pt compliant with medications. Pt supported emotionally and encouraged to express concerns and ask questions.   R. Pt remains safe with 15 minute checks. Will continue POC.

## 2019-11-20 NOTE — Progress Notes (Signed)
D: Pt presents pleasant and denies SI/HI/AVH or self harm at this time. He reports feelings of anxiety but generally states feeling much better and ready to go home. He denies any discomfort/pain.   A: Scheduled medications administered to patient per MD order. Support and encouragement provided. Routine safety checks conducted every 15 minutes. Patient informed to notify staff with any concerns.   R:Pt remains compliant with medications and treatment plan. Patient receptive, calm, and cooperative. Patient interacts well with others on the unit. Patient remains safe at this time and voices no concerns.

## 2019-11-20 NOTE — [facility] Group Notes (Signed)
°  [facility] LCSW Group Therapy Note  Date/Time:  11/20/2019 11:15AM-12:00PM  Type of Therapy and Topic:  Group Therapy:  Self-Care after Hospital Discharge  Participation Level:  Minimal   Description of Group This process group involved patients discussing how they plan to take care of themselves in a better manner when they get home from the hospital.  The group started with patients listing one healthy and one unhealthy way they took care of themselves prior to hospitalization.  A discussion ensued about the differences in healthy and unhealthy coping skills.  Group members shared ideas about making changes when they return home so that they can stay well and in recovery.  The white board was used to list ideas so that patients can continue to see these ideas throughout the day.  Therapeutic Goals Patient will identify and describe one healthy and one unhealthy coping technique used prior to hospitalization Patient will participate in generating ideas about healthy self-care options when they return to the community Patients will be supportive of one another and receive said support from others Patient will identify one healthy self-care activity to add to his/her post-hospitalization life that can help in recovery  Summary of Patient Progress:  The patient expressed that prior to hospitalization on healthy self-care activity he engaged in was not drinking, while one unhealthy self-care activity was drinking.  Patient's participation in group was minimal, as he was hyper-focused on when group would be over so he could get lunch.   Patient identified exercising as another self-care activity to add in his pursuit of recovery post-discharge.   Therapeutic Modalities Brief Solution-Focused Therapy Motivational Interviewing Psychoeducation   Ambrose Mantle, LCSW 11/20/2019, 12:00pm

## 2019-11-20 NOTE — Progress Notes (Signed)
°   11/20/19 2000  Precautions / Armbands  Precautions None  Patient armbands applied: Patient Identification (White)  [facility] Fall Risk Assessment  Age 24  Mental Status 0  Physical Satus 0  Elimination 0  Sensory Impairments 0  Gait or Balance 0  History or falls in past 6 months 0  Mood Stabilizer Medications 2  Benzodiazepines 0  Narcotics 0  Sedatives/Hypnotics 0  Atypical Anti Psychotics 2  Detox Protocol (Alcohol, Narcotics, etc.) 0  Total Score 4  Patient Fall Risk Level Low fall risk  Required Bundle Interventions *See Row Information* Low fall risk - low requirements implemented  Screening for Fall Injury Risk (To be completed on HIGH fall risk patients) - Assessing Need for Low Bed  Risk For Fall Injury- Low Bed Criteria None identified - Continue screening  Screening for Fall Injury Risk (To be completed on HIGH fall risk patients who do not meet crieteria for Low Bed) - Assessing Need for Floor Mats Only  Risk For Fall Injury- Criteria for Floor Mats None identified - No additional interventions needed  Safe Patient Handling Required Documentation-Repositioning Needs  Assist with movement in bed No  Fragile skin with pressure ulcer No  Unresponsive No  Safe Patient Handling Assessment  Ambulates independently Yes, no lift needed

## 2019-11-20 NOTE — Progress Notes (Addendum)
EKG results placed on the outside of pt's shadow chart: Normal sinus rhythm Normal ECG QT/QTc  364/387 ms

## 2019-11-20 NOTE — Plan of Care (Signed)
  Problem: Self-Concept: Goal: Level of anxiety will decrease Outcome: Progressing   Problem: Activity: Goal: Interest or engagement in activities will improve Outcome: Progressing   Problem: Activity: Goal: Sleeping patterns will improve Outcome: Progressing
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
## 2019-11-20 NOTE — Progress Notes (Signed)
Patient observed sitting in the dayroom drinking coffee and watching tv. Writer spoke with him about his day. He reports that his day was good and he sleeps really good at night. He reports that he plans on negotiating with the person that put him here, whoever that is he reports. 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 He reports his medications causes him to sleep really hard that he doesn't get up for breakfast. Support given and safety maintained with 15 min checks.

## 2019-11-21 NOTE — Progress Notes (Signed)
[place] NOVEL CORONAVIRUS (COVID-19) DAILY CHECK-OFF SYMPTOMS - answer yes or no to each - every day NO YES  Have you had a fever in the past 24 hours?  . Fever (Temp > 37.80C / 100F) X   Have you had any of these symptoms in the past 24 hours? . New Cough .  Sore Throat  .  Shortness of Breath .  Difficulty Breathing .  Unexplained Body Aches   X   Have you had any one of these symptoms in the past 24 hours not related to allergies?   . Runny Nose .  Nasal Congestion .  Sneezing   X   If you have had runny nose, nasal congestion, sneezing in the past 24 hours, has it worsened?  X   EXPOSURES - check yes or no X   Have you traveled outside the state in the past 14 days?  X   Have you been in contact with someone with a confirmed diagnosis of COVID-19 or PUI in the past 14 days without wearing appropriate PPE?  X   Have you been living in the same home as a person with confirmed diagnosis of COVID-19 or a PUI (household contact)?    X   Have you been diagnosed with COVID-19?    X              What to do next: Answered NO to all: Answered YES to anything:   Proceed with unit schedule Follow the [facility] Inpatient Flowsheet.   

## 2019-11-21 NOTE — [facility] Group Notes (Signed)
[facility] Group Notes: (Clinical Social Work)   11/21/2019      Type of Therapy:  Group Therapy   Participation Level:  Did Not Attend - was invited both individually by [facility] and by overhead announcement, chose not to attend.   Ambrose Mantle, LCSW 11/21/2019, 3:10 PM
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
## 2019-11-21 NOTE — Progress Notes (Addendum)
   11/21/19 0900  Psych Admission Type (Psych Patients Only)  Admission Status Involuntary  Psychosocial Assessment  Patient Complaints Anxiety  Eye Contact Fair  Facial Expression Anxious  Affect Anxious;Preoccupied  Speech Logical/coherent  Interaction Assertive  Motor Activity Fidgety;Restless  Appearance/Hygiene Improved  Behavior Characteristics Cooperative;Appropriate to situation  Mood Anxious  Aggressive Behavior  Targets Self  Type of Behavior Verbal;Threatening  Effect No apparent injury  Thought Process  Coherency Concrete thinking  Content WDL  Delusions None reported or observed  Perception WDL  Hallucination None reported or observed  Judgment Limited  Confusion None  Danger to Self  Current suicidal ideation? Denies  Danger to Others  Danger to Others None reported or observed   Patient has been friendly and polite during interactions- pt is calm and cooperative with no behavioral issues noted on the unit. Per pt's self inventory, pt rated his depression, hopelessness and anxiety all 0's. Pt currently denies SI/HI and A/VH. Pt is compliant with meds, although pt has complained of being overly sleepy on Tegretol.

## 2019-11-22 MED ORDER — HALOPERIDOL 10 MG PO TABS: 10.0000 mg | ORAL_TABLET | Freq: Two times a day (BID) | ORAL | 0 refills | Status: DC

## 2019-11-22 MED ORDER — CARBAMAZEPINE 200 MG PO TABS: 300.0000 mg | ORAL_TABLET | Freq: Every day | ORAL | 0 refills | Status: DC

## 2019-11-22 MED ORDER — BENZTROPINE MESYLATE 0.5 MG PO TABS: 0.5000 mg | ORAL_TABLET | Freq: Two times a day (BID) | ORAL | 0 refills | Status: DC

## 2019-11-22 MED ORDER — CARBAMAZEPINE 200 MG PO TABS: 200.0000 mg | ORAL_TABLET | ORAL | 0 refills | Status: DC

## 2019-11-22 MED ORDER — RISPERIDONE 3 MG PO TBDP: 3.0000 mg | ORAL_TABLET | Freq: Every day | ORAL | 0 refills | Status: DC

## 2019-11-22 MED ORDER — RISPERIDONE 3 MG PO TBDP
3.0000 mg | ORAL_TABLET | Freq: Every day | ORAL | Status: DC
  Filled 2019-11-22: qty 7
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
## 2019-11-22 NOTE — Plan of Care (Signed)
Pt was able to focus on task with minimal prompting from LRT at completion of recreation therapy group sessions.    Caroll Rancher, LRT/CTRS

## 2019-11-22 NOTE — Progress Notes (Signed)
Discharge note:  Patient discharged home with parent.  Patient denied SI and HI.  Denied A/V hallucinations.  Suicide prevention information given and discussed with patient who stated he understood and had no questions.  Patient stated he received all his belongings, etc.  Patient stated he appreciated all assistance received from [facility] staff.  All required discharge information given to patient at discharge.

## 2019-11-22 NOTE — Progress Notes (Signed)
D:  Patient denied SI and HI, contracts for safety.  Denied A/V hallucinations.   A:  Medications administered per MD orders.  Emotional support and encouragement given patient. R:  Safety maintained with 15 minute checks.  

## 2019-11-22 NOTE — Progress Notes (Signed)
Recreation Therapy Notes  INPATIENT RECREATION TR PLAN  Patient Details Name: Brendan Hogan MRN: [id number] DOB: 11-19-1995 Today's Date: 11/22/2019  Rec Therapy Plan Is patient appropriate for Therapeutic Recreation?: Yes Treatment times per week: about 3 days Estimated Length of Stay: 5-7 days TR Treatment/Interventions: Group participation (Comment)  Discharge Criteria Pt will be discharged from therapy if:: Discharged Treatment plan/goals/alternatives discussed and agreed upon by:: Patient/family  Discharge Summary Short term goals set: See patient care plan Short term goals met: Adequate for discharge Progress toward goals comments: Groups attended Which groups?: Wellness, Other (Comment) (Triggers) Reason goals not met: None Therapeutic equipment acquired: N/A Reason patient discharged from therapy: Discharge from hospital Pt/family agrees with progress & goals achieved: Yes Date patient discharged from therapy: 11/22/19    [name], LRT/CTRS  [name], Lowell Makara A 11/22/2019, 12:38 PM

## 2019-11-22 NOTE — Progress Notes (Signed)
  [facility] Adult Case Management Discharge Plan :  Will you be returning to the same living situation after discharge:  Yes,  with parents. At discharge, do you have transportation home?: Yes,  father to pick patient up. Do you have the ability to pay for your medications: No. Samples to be provided at discharge.  Release of information consent forms completed and in the chart;  Patient's signature needed at discharge.  Patient to Follow up at:  Follow-up Information    [facility] Follow up.   Why: Heidi will call the parents and set up an appointment for intake Contact information: [address] [place], [place] [address]  P: [phone] F: [phone]       [facility]. Go on 12/01/2019.   Specialty: Behavioral Health Why: You have an appointment on 12/01/19 at 8:00 am for therapy services.  You also have an appointment for medication management 12/20/19 at 4:30 pm. These appointments will be held in person.  Please arrive 15 minutes prior to your appointment. Contact information: [address] [place] [address] [phone]              Next level of care provider has access to [facility] Link:yes  Safety Planning and Suicide Prevention discussed: Yes,  with father.     Has patient been referred to the Quitline?: N/A patient is not a smoker  Patient has been referred for addiction treatment: Pt. refused referral  Otelia Santee, LCSW 11/22/2019, 10:16 AM

## 2019-11-22 NOTE — Progress Notes (Signed)
   11/21/19 2100  Psych Admission Type (Psych Patients Only)  Admission Status Involuntary  Psychosocial Assessment  Patient Complaints Anxiety  Eye Contact Fair  Facial Expression Anxious  Affect Anxious;Preoccupied  Speech Logical/coherent  Interaction Assertive  Motor Activity Fidgety  Appearance/Hygiene Improved  Behavior Characteristics Appropriate to situation;Cooperative  Aggressive Behavior  Targets Self  Type of Behavior Verbal;Threatening  Effect No apparent injury  Thought Process  Coherency Concrete thinking  Content WDL  Delusions None reported or observed  Perception WDL  Hallucination None reported or observed  Judgment Limited  Confusion None  Danger to Self  Current suicidal ideation? Denies  Danger to Others  Danger to Others None reported or observed

## 2019-11-22 NOTE — Progress Notes (Signed)
   11/22/19 0116  COVID-19 Daily Checkoff  Have you had a fever (temp > 37.80C/100F)  in the past 24 hours?  No  If you have had runny nose, nasal congestion, sneezing in the past 24 hours, has it worsened? No  COVID-19 EXPOSURE  Have you traveled outside the state in the past 14 days? No  Have you been in contact with someone with a confirmed diagnosis of COVID-19 or PUI in the past 14 days without wearing appropriate PPE? No  Have you been living in the same home as a person with confirmed diagnosis of COVID-19 or a PUI (household contact)? No  Have you been diagnosed with COVID-19? No

## 2019-11-22 NOTE — Progress Notes (Signed)
Recreation Therapy Notes  Date: 7.26.21 Time: 1000 Location: 500 Hall Dayroom  Group Topic: Wellness  Goal Area(s) Addresses:  Patient will define components of whole wellness. Patient will verbalize benefit of whole wellness.  Behavioral Response: Engaged  Intervention: Music  Activity: Exercise.  LRT led patients in a series of stretches to loosen up.  Patients then took turns leading group in various exercises.  Patients were told to get water and take breaks as needed.  Patients were also given the opportunity to request songs they wanted to hear when the exercises were done.  Education: Wellness, Building control surveyor.   Education Outcome: Acknowledges education/In group clarification offered/Needs additional education.   Clinical Observations/Feedback: Pt was engaged and fully participated.  Pt led group in doing jumping jacks and some leg stretches.  Pt also requested two songs he wanted to hear once exercises were completed.     Caroll Rancher, LRT/CTRS    Lillia Abed, Archana Eckman A 11/22/2019 12:12 PM

## 2019-11-22 NOTE — BHH Suicide Risk Assessment (Addendum)
[facility] Discharge Suicide Risk Assessment   Principal Problem: Schizoaffective disorder [facility]) Discharge Diagnoses: Principal Problem:   Schizoaffective disorder (HCC)   Total Time spent with patient: 30 minutes  Musculoskeletal: Strength & Muscle Tone: within normal limits Gait & Station: normal Patient leans: N/A  Psychiatric Specialty Exam: Review of Systems no headache, no blurry vision, no chest pain, no cough, no vomiting, no rash  Blood pressure 107/71, pulse 80, temperature 98.1 F (36.7 C), temperature source Oral, resp. rate 18, height 5\' 7"  (1.702 m), weight 56.7 kg, SpO2 100 %.Body mass index is 19.58 kg/m.  General Appearance: improved grooming   Eye Contact::  Fair, improves during session  Speech:  Normal Rate  Not loud or pressured at this time  Volume:  Normal  Mood:  improving mood, denies feeling depressed, denies feeling angry , describes mood as 7/10 with 10 being best   Affect:  not irritable or angry, vaguely blunted  Thought Process:  Linear and Descriptions of Associations: Circumstantial  Orientation:  Full (Time, Place, and Person)  Thought Content:  no hallucinations, no delusions, not internally preoccupied   Suicidal Thoughts:  No denies suicidal or self injurious ideations, denies homicidal or violent ideations, and specifically also denies any thoughts of violence or HI towards his family/parents   Homicidal Thoughts:  No  Memory:  recent and remote grossly intact   Judgement:  Other:  improving   Insight:  Fair  Psychomotor Activity:  Normal  Concentration:  Good  Recall:  Good  Fund of Knowledge:Good  Language: Good  Akathisia:  Negative  Handed:  Right  AIMS (if indicated):     Assets:  Communication Skills Resilience  Sleep:  Number of Hours: 6  Cognition: WNL  ADL's:  Intact   Mental Status Per Nursing Assessment::   On Admission:  NA  Demographic Factors:  10 y old single male, lives with parents   Loss Factors: History of mental  illness , schizoaffective disorder   Historical Factors: History of Schizoaffective Disorder , history of prior admissions   Risk Reduction Factors:   Sense of responsibility to family, Living with another person, especially a relative, Positive social support and Positive coping skills or problem solving skills  Continued Clinical Symptoms:  Currently patient is alert, attentive, calm, polite on approach, speech normal/not loud or pressured, affect improved- not expansive or irritable, thought process linear/circumstantial. No suicidal ideations, future oriented, stating he is going to be applying for jobs after discharge and is also hoping to be able to play soccer with his friends later this week. Denies any homicidal or violent ideations. Denies hallucinations, and does not appaer internally preoccupied, no delusions are currently expressed . Ox 3 . With his express consent I spoke with his father on the phone, whom I also spoke with 2-3 days ago. Mr. Grassia states Gerrit appears improved compared to admission and is in agreement with discharge today.  Patient has declined scheduled IM depot Antipsychotic ( Risperidone Consta, which was due 7/22) but has been compliant with PO medications ( Risperidone and Tegretol). Father states he thinks patient actually does better with PO medications and that parents will encourage him to comply /continue taking medications. No disruptive or agitated behaviors on unit, cooperative on approach. Side effects, including risk of severe rash , hematologic side effects on Tegretol and risk of motor/metabolic side effects from Risperidone have been reviewed . * Patient is on two antipsychotics ( Haloperidol, Risperidone) . These medications have been well tolerated and  it is felt he has responded to the combination. EKGs 7/19 QTc 446 and 7/24 QTc 387)    Cognitive Features That Contribute To Risk:  No gross cognitive deficits noted upon discharge. Is alert ,  attentive, and oriented x 3   Suicide Risk:  Mild:  Suicidal ideation of limited frequency, intensity, duration, and specificity.  There are no identifiable plans, no associated intent, mild dysphoria and related symptoms, good self-control (both objective and subjective assessment), few other risk factors, and identifiable protective factors, including available and accessible social support.   Follow-up Information    [facility] Follow up.   Why: Heidi will call the parents and set up an appointment for intake Contact information: [address] [place], [place] [address]  P: [phone] F: [phone]       [facility]. Go on 12/01/2019.   Specialty: Behavioral Health Why: You have an appointment on 12/01/19 at 8:00 am for therapy services.  You also have an appointment for medication management 12/20/19 at 4:30 pm. These appointments will be held in person.  Please arrive 15 minutes prior to your appointment. Contact information: [address] [place] [address] [phone]              Plan Of Care/Follow-up recommendations:  Activity:  as tolerated Diet:  regular Tests:  NA Other:  See below  Patient is expressing readiness for discharge and there are no further grounds for involuntary involuntary commitment at this time. He is leaving unit in good spirits .Plans to return home. Father will pick him up later today.  Craige Cotta, MD 11/22/2019, 8:11 AM

## 2019-12-01 ENCOUNTER — Ambulatory Visit (HOSPITAL_COMMUNITY): Payer: No Payment, Other | Admitting: Licensed Clinical Social Worker

## 2019-12-12 ENCOUNTER — Encounter (HOSPITAL_COMMUNITY): Payer: Self-pay | Admitting: Student

## 2019-12-12 ENCOUNTER — Other Ambulatory Visit: Payer: Self-pay

## 2019-12-12 ENCOUNTER — Emergency Department (HOSPITAL_COMMUNITY)
Admission: EM | Admit: 2019-12-12 | Discharge: 2019-12-12 | Disposition: A | Discharge status: Home / Self Care | Payer: Self-pay | Attending: Emergency Medicine | Admitting: Emergency Medicine

## 2019-12-12 DIAGNOSIS — I10 Essential (primary) hypertension: Secondary | ICD-10-CM | POA: Insufficient documentation

## 2019-12-12 DIAGNOSIS — F259 Schizoaffective disorder, unspecified: Secondary | ICD-10-CM | POA: Insufficient documentation

## 2019-12-12 DIAGNOSIS — F17210 Nicotine dependence, cigarettes, uncomplicated: Secondary | ICD-10-CM | POA: Insufficient documentation

## 2019-12-12 DIAGNOSIS — G479 Sleep disorder, unspecified: Secondary | ICD-10-CM

## 2019-12-12 DIAGNOSIS — Z20822 Contact with and (suspected) exposure to covid-19: Secondary | ICD-10-CM | POA: Insufficient documentation

## 2019-12-12 DIAGNOSIS — F5105 Insomnia due to other mental disorder: Secondary | ICD-10-CM | POA: Insufficient documentation

## 2019-12-12 DIAGNOSIS — Z79899 Other long term (current) drug therapy: Secondary | ICD-10-CM | POA: Insufficient documentation

## 2019-12-12 LAB — SARS CORONAVIRUS 2 BY RT PCR (HOSPITAL ORDER, PERFORMED IN ~~LOC~~ HOSPITAL LAB): SARS Coronavirus 2: NEGATIVE

## 2019-12-12 NOTE — ED Notes (Signed)
This RN reviewed discharge instructions with pt. Pt and family verbalized understanding. Pt stable and ambulatory.

## 2019-12-12 NOTE — ED Provider Notes (Signed)
[facility] EMERGENCY DEPARTMENT Provider Note   CSN: [id number] Arrival date & time: 12/12/19  0005     History Chief Complaint  Patient presents with  . Psychiatric Evaluation    Brendan Hogan is a 24 y.o. male with a history of bipolar disorder, schizophrenia, & psychosis who presents to the ED with his father for evaluation of insomnia for the past 2 days.  Patient states that he has not slept in 2 days, he states when he tries to lay down he cannot fall asleep, he is up pacing, no alleviating or aggravating factors.  His father states that he tried giving him Tylenol PM without relief.  Patient states he is not taking any prescription medications, his father confirms this, it appears that he was prescribed several medications at discharge from behavioral health last night, his father states that the patient feels he does not need them.  The patient denies any SI, HI, or hallucinations.  His father does state that he does seem to be responding to internal stimuli at times but not significantly.  HPI     Past Medical History:  Diagnosis Date  . Psychosis [facility]) 05/2019   [facility]   . Seasonal allergies     Patient Active Problem List   Diagnosis Date Noted  . Schizoaffective disorder (HCC) 11/16/2019  . Primary insomnia 11/04/2019  . Schizophrenia (HCC) 10/19/2019  . Psychosis (HCC) 10/19/2019  . Other bipolar disorder (HCC)   . 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 TONSILLECTOMY         Family History  Problem Relation Age of Onset  . Hypertension Mother     Social History   Tobacco Use  . Smoking status: Current Every Day Smoker    Packs/day: 1.00    Types: Cigarettes  . Smokeless tobacco: Former Clinical biochemist  . Vaping Use: Never used  Substance Use Topics  . Alcohol use: Not Currently    Comment: rare  . Drug use: No    Home Medications Prior to  Admission medications   Medication Sig Start Date End Date Taking? Authorizing Provider  benztropine (COGENTIN) 0.5 MG tablet Take 1 tablet (0.5 mg total) by mouth 2 (two) times daily. 11/22/19   Thermon Leyland, NP  carbamazepine (TEGRETOL) 200 MG tablet Take 1 tablet (200 mg total) by mouth every morning. 11/22/19   Thermon Leyland, NP  carbamazepine (TEGRETOL) 200 MG tablet Take 1.5 tablets (300 mg total) by mouth at bedtime. 11/22/19   Thermon Leyland, NP  haloperidol (HALDOL) 10 MG tablet Take 1 tablet (10 mg total) by mouth 2 (two) times daily. 11/22/19   Thermon Leyland, NP  nicotine polacrilex (NICORETTE) 2 MG gum Take 1 each (2 mg total) by mouth as needed for smoking cessation. Patient not taking: Reported on 11/15/2019 10/25/19   Aldean Baker, NP  risperidone (RISPERDAL M-TABS) 3 MG disintegrating tablet Take 1 tablet (3 mg total) by mouth at bedtime. 11/22/19   Thermon Leyland, NP  risperiDONE microspheres (RISPERDAL CONSTA) 25 MG injection Inject 2 mLs (25 mg total) into the muscle every 14 (fourteen) days. Patient not taking: Reported on 11/15/2019 11/07/19   Aldean Baker, NP    Allergies    Patient has no known allergies.  Review of Systems   Review of Systems  Constitutional: Negative for chills and fever.  Respiratory: Negative for  shortness of breath.   Cardiovascular: Negative for chest pain.  Gastrointestinal: Negative for abdominal pain.  Neurological: Negative for syncope.  Psychiatric/Behavioral: Positive for sleep disturbance. Negative for hallucinations and suicidal ideas.  All other systems reviewed and are negative.   Physical Exam Updated Vital Signs BP 132/83 (BP Location: Right Arm)   Pulse 66   Temp 97.7 F (36.5 C) (Oral)   Resp 16   SpO2 100%   Physical Exam Vitals and nursing note reviewed.  Constitutional:      General: He is not in acute distress.    Appearance: He is well-developed. He is not toxic-appearing.  HENT:     Head: Normocephalic and  atraumatic.  Eyes:     General:        Right eye: No discharge.        Left eye: No discharge.     Conjunctiva/sclera: Conjunctivae normal.  Cardiovascular:     Rate and Rhythm: Normal rate and regular rhythm.  Pulmonary:     Effort: Pulmonary effort is normal. No respiratory distress.     Breath sounds: Normal breath sounds. No wheezing, rhonchi or rales.  Abdominal:     General: There is no distension.     Palpations: Abdomen is soft.     Tenderness: There is no abdominal tenderness.  Musculoskeletal:     Cervical back: Neck supple.  Skin:    General: Skin is warm and dry.     Findings: No rash.  Neurological:     Mental Status: He is alert.     Comments: Clear speech.   Psychiatric:        Thought Content: Thought content does not include homicidal or suicidal ideation.     Comments: Patient speech is a bit rapid, he does pace the halls at times waiting for evaluation.  Does not appear to be actively responding to internal stimuli.     ED Results / Procedures / Treatments   Labs (all labs ordered are listed, but only abnormal results are displayed) Labs Reviewed - No data to display  EKG None  Radiology No results found.  Procedures Procedures (including critical care time)  Medications Ordered in ED Medications - No data to display  ED Course  I have reviewed the triage vital signs and the nursing notes.  Pertinent labs & imaging results that were available during my care of the patient were reviewed by me and considered in my medical decision making (see chart for details).    MDM Rules/Calculators/A&P                         Patient presents to the emergency department for evaluation of difficulty sleeping over the past 2 days, his father relays that he may be responding to internal stimuli at times but overall has seemed okay, no SI or HI reported.  It appears he was just discharged from behavioral health with several prescription medications, he is not  taking any of these.  I called and spoke with Nira Conn NP with [facility]-accepts patient in transfer to behavioral health urgent care for evaluation after Covid test is obtained. Patient is sleeping some in the ED currently.   Following initial discussion with patient & his father they decided they did not wish to go to [facility] or remain in the ED for TTS assessment. Patient is without SI or HI, he does not appear to have significant acute psychosis- does not meet IVC criteria. Discharged  with behavioral health information for follow up information.   Final Clinical Impression(s) / ED Diagnoses Final diagnoses:  Sleep trouble  Insomnia due to other mental disorder    Rx / DC Orders ED Discharge Orders    None       Cherly Anderson, PA-C 12/12/19 0411    Tilden Fossa, MD 12/12/19 [id number]

## 2019-12-12 NOTE — ED Triage Notes (Signed)
Patient brought in voluntary by parents. They state patient has not slept for 2 days. History of aggressive behavior and psychosis. Patient is calm but paranoid.

## 2019-12-12 NOTE — ED Notes (Signed)
Pt was advised to seek treatment at the [facility]al Urgent Care. Pt stated he had "plans" and could not stay over night. Pt will seek treatment "soon".

## 2019-12-20 ENCOUNTER — Encounter (HOSPITAL_COMMUNITY): Payer: Federal, State, Local not specified - Other | Admitting: Psychiatry

## 2020-05-29 ENCOUNTER — Other Ambulatory Visit: Payer: Self-pay

## 2020-05-29 ENCOUNTER — Encounter (HOSPITAL_COMMUNITY): Payer: Self-pay | Admitting: Emergency Medicine

## 2020-05-29 ENCOUNTER — Ambulatory Visit (HOSPITAL_COMMUNITY)
Admission: EM | Admit: 2020-05-29 | Discharge: 2020-05-29 | Disposition: A | Discharge status: Home / Self Care | Payer: 59 | Source: Home / Self Care | Attending: Psychiatry | Admitting: Psychiatry

## 2020-05-29 DIAGNOSIS — F250 Schizoaffective disorder, bipolar type: Secondary | ICD-10-CM

## 2020-05-29 DIAGNOSIS — G4700 Insomnia, unspecified: Secondary | ICD-10-CM | POA: Diagnosis not present

## 2020-05-29 MED ORDER — QUETIAPINE FUMARATE 100 MG PO TABS
100.0000 mg | ORAL_TABLET | Freq: Every day | ORAL | 0 refills | Status: DC
Start: 2020-05-29 — End: 2021-05-14

## 2020-05-29 NOTE — ED Provider Notes (Signed)
[facility] Medical Screening Exam  Patient Name: Brendan Hogan MRN: [id number] Date of Evaluation: 05/29/20 Chief Complaint:   Diagnosis:  Final diagnoses:  Insomnia, unspecified type    History of Present illness: LEVEN HOEL is a 25 y.o. male with a history of schizophrenia spectrum disorder per chart who presents voluntarily accompanied by mother for trouble sleeping. Patient interviewed with mother present. Pt states that he presented today because "I am having sleep problems but its not every day". He states that he has been awake since 10 am/11 am yesterday and has not slept since. He states that he took melatonin and hydroxyzine although it has not been effective. He denies SI/HI/AVH. He denies psychotic sx. Pt's biggest concern is sleep. This is confirmed with mother. Patient recalls hospitalization in 10/2019, he states that this hospitalization was mostly due to his drinking (this is not c/w chart review as patient presented psychotic) and that he has not taken any of the medications that were prescribed to him at time of discharge.  Mother has no safety concerns and is only concerned about poor sleep.  Patient is focused on receiving controlled medications; repeatedly asks about xanax despite  explaining to patient that controlled medications, like xanax, will not prescribed out of the ER. Pt then requests medications that "people get before surgery" to help  Him sleep. Discussed options. Patient amenable to seroquel at bedtime although continued to request xanax.   Psychiatric Specialty Exam  Presentation  General Appearance:Appropriate for Environment; Casual  Eye Contact:Fair  Speech:Clear and Coherent; Normal Rate  Speech Volume:Normal  Handedness:No data recorded  Mood and Affect  Mood:Irritable  Affect:Appropriate; Congruent   Thought Process  Thought Processes:Linear; Goal Directed  Descriptions of Associations:Intact  Orientation:Full  (Time, Place and Person)  Thought Content:WDL  Hallucinations:None  Ideas of Reference:None  Suicidal Thoughts:No  Homicidal Thoughts:No   Sensorium  Memory:Immediate Good; Recent Good; Remote Fair  Judgment:Fair  Insight:Fair   Executive Functions  Concentration:Fair  Attention Span:Fair  Recall:Fair  Fund of Knowledge:Fair  Language:Fair   Psychomotor Activity  Psychomotor Activity:Normal   Assets  Assets:Communication Skills; Desire for Improvement; Housing; Resilience   Sleep  Sleep:Poor  Number of hours: No data recorded  Physical Exam: Physical Exam Constitutional:      Appearance: Normal appearance. He is normal weight.  HENT:     Head: Normocephalic and atraumatic.  Eyes:     Extraocular Movements: Extraocular movements intact.  Pulmonary:     Effort: Pulmonary effort is normal.  Neurological:     Mental Status: He is alert.    Review of Systems  Constitutional: Negative for chills and fever.  Eyes: Negative for discharge and redness.  Respiratory: Negative.   Cardiovascular: Negative.   Neurological: Negative for focal weakness.  Psychiatric/Behavioral: Negative for depression and suicidal ideas. The patient has insomnia.    Blood pressure 119/79, pulse 80, temperature 97.9 F (36.6 C), temperature source Oral, resp. rate 18, height 5\' 8"  (1.727 m), weight 65.8 kg, SpO2 100 %. Body mass index is 22.05 kg/m.  Musculoskeletal: Strength & Muscle Tone: within normal limits Gait & Station: normal Patient leans: N/A   [facility] MSE Discharge Disposition for Follow up and Recommendations: Based on my evaluation the patient does not appear to have an emergency medical condition and can be discharged with resources and follow up care in outpatient services for Medication Management  seroquel appears to be a good choice as patient has a h/o psychotic disorder and reporting  insomnia.    Provided 7 day rx of seroquel 100 mg qhs. Recommended  that patient establish care with outpatient provider if this persists     Estella Husk, MD 05/29/2020, 1:32 PM

## 2020-05-29 NOTE — Discharge Instructions (Signed)
Please come to [facility]al Health Urgent Care (this facility) during walk in hours for appointment with psychiatrist for further medication management and for therapy.   Walk in hours are 8-11 AM Monday through Thursday for medication management.It is first come, first -serve; it is best to arrive by 7:30 AM. On Friday from 1 pm to 4 pm for therapy intake only. Please arrive by 12:00 pm as it is  first come, first -serve.   When you arrive please go upstairs for your appointment. If you are unsure of where to go, inform the front desk that you are here for a walk in appointment and they will assist you with directions upstairs.  Address:  [address], in [place], [address] Ph: [phone]   

## 2020-05-29 NOTE — ED Notes (Signed)
Patient A&O x 4, ambulatory. Patient discharged in no acute distress. Patient denied SI/HI, A/VH upon discharge. Patient verbalized understanding of instructions reviewed on AVS per staff. Patient escorted to lobby via staff for transport to destination. Safety maintained.

## 2020-05-29 NOTE — ED Triage Notes (Signed)
Pt presents to [facility] as a walk-in accompanied my mother with complaints of not being able to sleep. Per pt, he started having problems with sleep when he stopped drinking. Pt is requesting evaluation.Pt denies SI, HI and AVH at this time.

## 2020-07-19 ENCOUNTER — Ambulatory Visit (INDEPENDENT_AMBULATORY_CARE_PROVIDER_SITE_OTHER): Payer: Self-pay | Admitting: Family Medicine

## 2020-07-19 ENCOUNTER — Other Ambulatory Visit: Payer: Self-pay

## 2020-07-19 ENCOUNTER — Encounter: Payer: Self-pay | Admitting: Family Medicine

## 2020-07-19 VITALS — BP 119/66 | HR 87 | Ht 71.0 in | Wt 124.4 lb

## 2020-07-19 DIAGNOSIS — F5101 Primary insomnia: Secondary | ICD-10-CM

## 2020-07-19 DIAGNOSIS — F209 Schizophrenia, unspecified: Secondary | ICD-10-CM

## 2020-07-19 MED ORDER — HYDROXYZINE HCL 25 MG PO TABS: 50.0000 mg | ORAL_TABLET | Freq: Every day | ORAL | 1 refills | Status: DC

## 2020-07-19 NOTE — Patient Instructions (Addendum)
It was great to see you! Thank you for allowing me to participate in your care!  I recommend that you always bring your medications to each appointment as this makes it easy to ensure we are on the correct medications and helps [place] not miss when refills are needed.  Our plans for today:  -I am refilling your hydroxyzine, however I think it would be best for your health if we did not continue with your Ambien. -I am providing some resources below for psychiatry and think these would be beneficial for you to reach out to to get a regular provider to help with your insomnia and with your history of delusions.   Take care and seek immediate care sooner if you develop any concerns.   Dr. Jackelyn Poling, DO [facility]   Psychiatry Resource List (Adults and Children) Most of these providers will take Medicaid. please consult your insurance for a complete and updated list of available providers. When calling to make an appointment have your insurance information available to confirm you are covered.   [facility]:Psychiatry and Counseling [address] [place], [place] [address] [phone]  [facility]  [address] [place], [place] [phone] Crisis [phone]   [name]:   [facility]: [address]     [phone]   [facility]: [address] [place]. [place],        [phone] [place]: [phone],    [phone] [place]: [phone] [address],                   [phone] Children: [facility] [address]         [phone]   [facility]  (Psychiatry only; Adults /children 12 and over, will take Medicaid)  [address] Laurell Josephs [address], [place], [place] [address]       [phone]   [facility] (Psychiatry & counseling ; adults & children ; will take Medicaid [address]  [place]  [place] [address]  Go on-line to complete referral ( [email or web address]://[email or web address].[email or web address]/en/make-[email or web address] 4452366809    (Spanish speaking therapists)  [facility]  Psychiatry & counseling; Adults and children;  Call Registration prior to scheduling an appointment [phone] [address]    [place], [place] [address]    [phone]  [facility] (Psychiatry & counseling; adults & children; Medicare no Medicaid)  [address]   [place], [place]  [address]      [phone]    [facility] (up to age 59)  Psychiatry & counseling ,will take Medicaid, must do counseling to receive psychiatry services  [address] [place] [address]        [phone]  [facility] (Psychiatry & counseling; adults & children; will take Medicaid) Will need a referral from provider [address],  [place], [place]  [phone]   [facility] --- Walk-In Mon-Friday 8am-3pm ( will take Medicaid, Psychiatry, Adults & children,  [address], [place], [place]   [phone]   [facility] [place], Walk-in M-F 8am-12pm and 1pm -3pm   (Counseling, Psychiatry, will take Medicaid, adults & children)  [address] [place], [place]  [phone]       If you are feeling suicidal or depression symptoms worsen please immediately go to:  24 Hour Availability [facility]  [address] [place], [place] [phone] Crisis [phone]    . If you are thinking about harming yourself or having thoughts of suicide, or if you know someone who is, seek help right away. . Call your doctor or mental health care provider. . Call 911 or go to a hospital emergency room to get immediate help, or ask a friend or family member to help you do these things. . Call the [place] National Suicide Prevention Lifeline's toll-free, 24-hour hotline at 1-800-273-TALK (502)841-7075) or TTY:  1-800-799-4 TTY (979-126-4317) to talk to a trained counselor. . If you are in crisis, make sure you are not left alone.  . If someone else is in crisis, make sure he or she is not left alone   [facility] [employer] (Domestic Violence, Rape & Victim Assistance [phone]  [facility]    (ONLY from 8am-4pm)    [phone]  [facility] (24/7)   [phone]  [place] National Suicide Hotline   [phone] Len Childs)

## 2020-07-19 NOTE — Progress Notes (Signed)
   Subjective:    Patient ID: Brendan Hogan, male    DOB: Oct 30, 1995, 24 y.o.   MRN: [id number]   CC: Establish care  HPI:  Brendan Hogan is a very pleasant 25 y.o. male who presents today to establish care.  Initial concerns: Requests refills on hydroxyzine 50mg  and zolpidem 10mg  at night. Poor sleep. Only sleeping 2-3 hours at a time even with the medications. He states that he couldn't sleep at all Saturday night and had to wait until Sunday night to sleep. He states he does have a history of delusions but no hallucinations. Patient continues to express that his main concern is his delusions which limit his ability to sleep and that he wants to talk to someone about that. He describes the delusions as "negative thoughts" and things he "would never do".   Past medical history: Past medical history includes brief psychotic disorder, schizophrenia.  The patient has been seen multiple times in the emergency department over the last 12 months.  Previously followed with psychiatry but is not currently going to them.   Past surgical history: Nose surgery years ago.  Current medications: Requests refills on hydroxyzine 50mg  and zolpidem 10mg  at night  Family history: Mother with hypertension  Social history: Lives at home with parents.  He states he smokes on occasion socially and dips tobacco.  He denies recreational drug use.  He states he no longer drinks but prior to last August he did drink a lot on a regular basis.  ROS: pertinent noted in the HPI   Objective:  BP 119/66   Pulse 87   Ht 5\' 11"  (1.803 m)   Wt 124 lb 6 oz (56.4 kg)   SpO2 98%   BMI 17.35 kg/m   Vitals and nursing note reviewed  General: NAD, pleasant, able to participate in exam HEENT: No pharyngeal erythema Cardiac: RRR, S1 S2 present. normal heart sounds, no murmurs. Respiratory: CTAB, normal effort, No wheezes, rales or rhonchi Abdomen: Bowel sounds present, non-tender, non-distended, no  hepatosplenomegaly Extremities: no edema or cyanosis. Skin: warm and dry, no rashes noted Neuro: alert, no obvious focal deficits Psych: Normal affect and mood   Assessment & Plan:    Insomnia and history of delusions: Assessment: 25 year old male with significant psych history who is not currently following with psychiatry and not currently taking his psychiatric medications.  He denies any SI/HI today.  He his main complaint is delusions which she states he has nightly which limits his ability to sleep.  He denies any hallucinations.  States his delusions are essentially "bad thoughts" to make it challenging for him to sleep. Plan: -We will refer for psychiatry -As patient has no insurance coverage I will provide him with a list of resources that he can use in the area -We will refill patient's hydroxyzine for his sleep, I would not refill his Ambien for his sleep   , DO [facility] PGY-2

## 2020-10-25 ENCOUNTER — Encounter (HOSPITAL_COMMUNITY): Payer: Self-pay | Admitting: Student

## 2020-10-25 ENCOUNTER — Emergency Department (HOSPITAL_COMMUNITY)
Admission: EM | Admit: 2020-10-25 | Discharge: 2020-10-27 | Disposition: A | Discharge status: Home / Self Care | Payer: Self-pay | Attending: Emergency Medicine | Admitting: Emergency Medicine

## 2020-10-25 ENCOUNTER — Other Ambulatory Visit: Payer: Self-pay

## 2020-10-25 DIAGNOSIS — F201 Disorganized schizophrenia: Secondary | ICD-10-CM | POA: Diagnosis present

## 2020-10-25 DIAGNOSIS — F29 Unspecified psychosis not due to a substance or known physiological condition: Secondary | ICD-10-CM | POA: Insufficient documentation

## 2020-10-25 DIAGNOSIS — Z79899 Other long term (current) drug therapy: Secondary | ICD-10-CM | POA: Insufficient documentation

## 2020-10-25 DIAGNOSIS — Z20822 Contact with and (suspected) exposure to covid-19: Secondary | ICD-10-CM | POA: Insufficient documentation

## 2020-10-25 DIAGNOSIS — F209 Schizophrenia, unspecified: Secondary | ICD-10-CM | POA: Insufficient documentation

## 2020-10-25 DIAGNOSIS — Z046 Encounter for general psychiatric examination, requested by authority: Secondary | ICD-10-CM

## 2020-10-25 DIAGNOSIS — F23 Brief psychotic disorder: Secondary | ICD-10-CM

## 2020-10-25 DIAGNOSIS — F17210 Nicotine dependence, cigarettes, uncomplicated: Secondary | ICD-10-CM | POA: Insufficient documentation

## 2020-10-25 LAB — CBC
HCT: 48.7 % (ref 39.0–52.0)
Hemoglobin: 16.5 g/dL (ref 13.0–17.0)
MCH: 29.5 pg (ref 26.0–34.0)
MCHC: 33.9 g/dL (ref 30.0–36.0)
MCV: 87 fL (ref 80.0–100.0)
Platelets: 251 10*3/uL (ref 150–400)
RBC: 5.6 MIL/uL (ref 4.22–5.81)
RDW: 12.2 % (ref 11.5–15.5)
WBC: 8.7 10*3/uL (ref 4.0–10.5)
nRBC: 0 % (ref 0.0–0.2)

## 2020-10-25 LAB — ETHANOL: Alcohol, Ethyl (B): <10 mg/dL (ref <10)

## 2020-10-25 LAB — COMPREHENSIVE METABOLIC PANEL
ALT: 11 U/L (ref 0–44)
AST: 23 U/L (ref 15–41)
Albumin: 4.5 g/dL (ref 3.5–5.0)
Alkaline Phosphatase: 100 U/L (ref 38–126)
Anion gap: 17 — ABNORMAL HIGH (ref 5–15)
BUN: 12 mg/dL (ref 6–20)
CO2: 16 mmol/L — ABNORMAL LOW (ref 22–32)
Calcium: 9.7 mg/dL (ref 8.9–10.3)
Chloride: 105 mmol/L (ref 98–111)
Creatinine, Ser: 1.18 mg/dL (ref 0.61–1.24)
GFR, Estimated: >60 mL/min (ref >60)
Glucose, Bld: 155 mg/dL — ABNORMAL HIGH (ref 70–99)
Potassium: 3.3 mmol/L — ABNORMAL LOW (ref 3.5–5.1)
Sodium: 138 mmol/L (ref 135–145)
Total Bilirubin: 1.1 mg/dL (ref 0.3–1.2)
Total Protein: 7.2 g/dL (ref 6.5–8.1)

## 2020-10-25 LAB — RESP PANEL BY RT-PCR (FLU A&B, COVID) ARPGX2
Influenza A by PCR: NEGATIVE
Influenza B by PCR: NEGATIVE
SARS Coronavirus 2 by RT PCR: NEGATIVE

## 2020-10-25 LAB — RAPID URINE DRUG SCREEN, HOSP PERFORMED
Amphetamines: NOT DETECTED
Barbiturates: NOT DETECTED
Benzodiazepines: NOT DETECTED
Cocaine: NOT DETECTED
Opiates: NOT DETECTED
Tetrahydrocannabinol: NOT DETECTED

## 2020-10-25 LAB — SALICYLATE LEVEL: Salicylate Lvl: <7 mg/dL — ABNORMAL LOW (ref 7.0–30.0)

## 2020-10-25 LAB — ACETAMINOPHEN LEVEL: Acetaminophen (Tylenol), Serum: <10 ug/mL — ABNORMAL LOW (ref 10–30)

## 2020-10-25 LAB — CK: Total CK: 112 U/L (ref 49–397)

## 2020-10-25 MED ORDER — SODIUM CHLORIDE 0.9 % IV BOLUS
1000.0000 mL | Freq: Once | INTRAVENOUS | Status: AC
  Administered 2020-10-25: 1000 mL via INTRAVENOUS

## 2020-10-25 MED ORDER — BENZTROPINE MESYLATE 0.5 MG PO TABS
0.5000 mg | ORAL_TABLET | Freq: Two times a day (BID) | ORAL | Status: DC
Start: 2020-10-25 — End: 2020-10-27
  Administered 2020-10-25 – 2020-10-27 (×4): 0.5 mg via ORAL
  Filled 2020-10-25 (×7): qty 1

## 2020-10-25 MED ORDER — POTASSIUM CHLORIDE CRYS ER 20 MEQ PO TBCR
40.0000 meq | EXTENDED_RELEASE_TABLET | Freq: Once | ORAL | Status: AC
  Administered 2020-10-25: 40 meq via ORAL
  Filled 2020-10-25: qty 2

## 2020-10-25 MED ORDER — STERILE WATER FOR INJECTION IJ SOLN
INTRAMUSCULAR | Status: AC
  Filled 2020-10-25: qty 10

## 2020-10-25 MED ORDER — HALOPERIDOL 5 MG PO TABS
5.0000 mg | ORAL_TABLET | Freq: Two times a day (BID) | ORAL | Status: DC
  Administered 2020-10-25 – 2020-10-27 (×4): 5 mg via ORAL
  Filled 2020-10-25 (×4): qty 1

## 2020-10-25 MED ORDER — SODIUM CHLORIDE 0.9 % IV BOLUS
1000.0000 mL | Freq: Once | INTRAVENOUS | Status: AC
  Administered 2020-10-25: 10:00:00 1000 mL via INTRAVENOUS

## 2020-10-25 MED ORDER — QUETIAPINE FUMARATE 100 MG PO TABS
100.0000 mg | ORAL_TABLET | Freq: Every day | ORAL | Status: DC
  Administered 2020-10-26: 100 mg via ORAL
  Filled 2020-10-25 (×2): qty 1

## 2020-10-25 MED ORDER — NICOTINE 21 MG/24HR TD PT24: 21.0000 mg | MEDICATED_PATCH | Freq: Every day | TRANSDERMAL | Status: DC | PRN

## 2020-10-25 MED ORDER — HYDROXYZINE HCL 25 MG PO TABS
50.0000 mg | ORAL_TABLET | Freq: Every day | ORAL | Status: DC
  Administered 2020-10-26: 50 mg via ORAL
  Filled 2020-10-25 (×2): qty 2

## 2020-10-25 MED ORDER — ZOLPIDEM TARTRATE 5 MG PO TABS: 5.0000 mg | ORAL_TABLET | Freq: Every evening | ORAL | Status: DC | PRN

## 2020-10-25 MED ORDER — LORAZEPAM 2 MG/ML IJ SOLN: 1.0000 mg | Freq: Once | INTRAMUSCULAR | Status: DC | PRN

## 2020-10-25 MED ORDER — ZIPRASIDONE MESYLATE 20 MG IM SOLR
INTRAMUSCULAR | Status: AC
  Administered 2020-10-25: 08:00:00 20 mg via INTRAMUSCULAR
  Filled 2020-10-25: qty 20

## 2020-10-25 MED ORDER — ALUM & MAG HYDROXIDE-SIMETH 200-200-20 MG/5ML PO SUSP: 30.0000 mL | Freq: Four times a day (QID) | ORAL | Status: DC | PRN

## 2020-10-25 MED ORDER — ACETAMINOPHEN 325 MG PO TABS: 650.0000 mg | ORAL_TABLET | ORAL | Status: DC | PRN

## 2020-10-25 NOTE — ED Notes (Signed)
Pt taken out of restraints and is sleeping at this time

## 2020-10-25 NOTE — ED Notes (Signed)
TTS at bedside. 

## 2020-10-25 NOTE — ED Provider Notes (Signed)
[facility] EMERGENCY DEPARTMENT Provider Note   CSN: [id number] Arrival date & time: 10/25/20  9379     History Chief Complaint  Patient presents with   Psychiatric Evaluation    Brendan Hogan is a 25 y.o. male with a history of schizophrenia, insomnia, and brief psychotic disorder who presents to the emergency department via police under IVC.  Per IVC paperwork completed by patient's father the patient has been talking to himself, threatening suicide by hanging, and yelling at his parents. Similar to prior psychiatric episodes.   Patient states he does not need to be here.  He denies SI, HI, or hallucinations.  He denies drug, tobacco, or alcohol use.  He states he only takes Ambien at night for sleep, he does not take any other medications.  HPI     Past Medical History:  Diagnosis Date   Psychosis (HCC) 05/2019   [facility]    Seasonal allergies     Patient Active Problem List   Diagnosis Date Noted   Schizoaffective disorder (HCC) 11/16/2019   Primary insomnia 11/04/2019   Schizophrenia (HCC) 10/19/2019   Psychosis (HCC) 10/19/2019   Other bipolar disorder (HCC)    Brief psychotic disorder (HCC) 09/07/2019    Past Surgical History:  Procedure Laterality Date   TONSILLECTOMY         Family History  Problem Relation Age of Onset   Hypertension Mother     Social History   Tobacco Use   Smoking status: Every Day    Packs/day: 1.00    Pack years: 0.00    Types: Cigarettes   Smokeless tobacco: Former  Building services engineer Use: Never used  Substance Use Topics   Alcohol use: Not Currently    Comment: rare   Drug use: No    Home Medications Prior to Admission medications   Medication Sig Start Date End Date Taking? Authorizing Provider  benztropine (COGENTIN) 0.5 MG tablet Take 1 tablet (0.5 mg total) by mouth 2 (two) times daily. Patient not taking: Reported on 07/19/2020 11/22/19   Thermon Leyland, NP  carbamazepine  (TEGRETOL) 200 MG tablet Take 1 tablet (200 mg total) by mouth every morning. Patient not taking: Reported on 07/19/2020 11/22/19   Thermon Leyland, NP  carbamazepine (TEGRETOL) 200 MG tablet Take 1.5 tablets (300 mg total) by mouth at bedtime. Patient not taking: Reported on 07/19/2020 11/22/19   Thermon Leyland, NP  haloperidol (HALDOL) 10 MG tablet Take 1 tablet (10 mg total) by mouth 2 (two) times daily. Patient not taking: Reported on 07/19/2020 11/22/19   Thermon Leyland, NP  hydrOXYzine (ATARAX/VISTARIL) 25 MG tablet Take 2 tablets (50 mg total) by mouth at bedtime. 07/19/20   Jackelyn Poling, DO  nicotine polacrilex (NICORETTE) 2 MG gum Take 1 each (2 mg total) by mouth as needed for smoking cessation. Patient not taking: No sig reported 10/25/19   Aldean Baker, NP  QUEtiapine (SEROQUEL) 100 MG tablet Take 1 tablet (100 mg total) by mouth at bedtime for 14 days. 05/29/20 06/12/20  Estella Husk, MD  risperidone (RISPERDAL M-TABS) 3 MG disintegrating tablet Take 1 tablet (3 mg total) by mouth at bedtime. Patient not taking: Reported on 07/19/2020 11/22/19   Thermon Leyland, NP  risperiDONE microspheres (RISPERDAL CONSTA) 25 MG injection Inject 2 mLs (25 mg total) into the muscle every 14 (fourteen) days. Patient not taking: No sig reported 11/07/19   Aldean Baker,  NP  zolpidem (AMBIEN) 10 MG tablet Take 10 mg by mouth daily. 06/24/20   [provider]    Allergies    Patient has no known allergies.  Review of Systems   Review of Systems  Constitutional:  Negative for chills and fever.  Respiratory:  Negative for shortness of breath.   Cardiovascular:  Negative for chest pain.  Gastrointestinal:  Negative for abdominal pain.  Neurological:  Negative for syncope.  Psychiatric/Behavioral:  Negative for hallucinations (per patient) and suicidal ideas (per patient).   All other systems reviewed and are negative.  Physical Exam Updated Vital Signs BP 104/70   Pulse 68   Temp 98 F  (36.7 C) (Oral)   Resp 16   Ht 5\' 11"  (1.803 m)   Wt 56.4 kg   SpO2 100%   BMI 17.34 kg/m   Physical Exam Vitals and nursing note reviewed.  Constitutional:      General: He is not in acute distress.    Appearance: He is well-developed. He is not toxic-appearing.     Comments: Upper extremities currently in handcuffs.   HENT:     Head: Normocephalic and atraumatic.  Eyes:     General:        Right eye: No discharge.        Left eye: No discharge.     Conjunctiva/sclera: Conjunctivae normal.  Cardiovascular:     Rate and Rhythm: Normal rate and regular rhythm.  Pulmonary:     Effort: Pulmonary effort is normal. No respiratory distress.     Breath sounds: Normal breath sounds. No wheezing, rhonchi or rales.  Abdominal:     General: There is no distension.     Palpations: Abdomen is soft.     Tenderness: There is no abdominal tenderness.  Musculoskeletal:     Cervical back: Neck supple.  Skin:    General: Skin is warm and dry.     Findings: No rash.  Neurological:     Mental Status: He is alert.     Comments: Clear speech.   Psychiatric:        Behavior: Behavior is agitated and aggressive.    ED Results / Procedures / Treatments   Labs (all labs ordered are listed, but only abnormal results are displayed) Labs Reviewed  RESP PANEL BY RT-PCR (FLU A&B, COVID) ARPGX2  CBC  COMPREHENSIVE METABOLIC PANEL  RAPID URINE DRUG SCREEN, HOSP PERFORMED  ETHANOL  SALICYLATE LEVEL  ACETAMINOPHEN LEVEL    EKG None  Radiology No results found.  Procedures .Critical Care  Date/Time: 10/25/2020 11:19 AM Performed by: 10/27/2020, PA-C Authorized by: Cherly Anderson, PA-C     CRITICAL CARE Performed by: Cherly Anderson   Total critical care time: 35 minutes  Critical care time was exclusive of separately billable procedures and treating other patients.  Critical care was necessary to treat or prevent imminent or life-threatening  deterioration.  Critical care was time spent personally by me on the following activities: development of treatment plan with patient and/or surrogate as well as nursing, discussions with consultants, evaluation of patient's response to treatment, examination of patient, obtaining history from patient or surrogate, ordering and performing treatments and interventions, ordering and review of laboratory studies, ordering and review of radiographic studies, pulse oximetry and re-evaluation of patient's condition. Medications Ordered in ED Medications  sterile water (preservative free) injection (has no administration in time range)  ziprasidone (GEODON) 20 MG injection (20 mg Intramuscular Given 10/25/20 0755)  ED Course  I have reviewed the triage vital signs and the nursing notes.  Pertinent labs & imaging results that were available during my care of the patient were reviewed by me and considered in my medical decision making (see chart for details).    MDM Rules/Calculators/A&P                          Patient presents to the ED under IVC.  Shortly following arrival patient became very agitated and aggressive he was given IM Geodon and placed in restraints due to escalation in behavior for both patient and staff safety.  Additional history obtained:  Additional history obtained from chart review & nursing note review.   Lab Tests:  I Ordered, reviewed, and interpreted labs, which included:  CBC: Unremarkable CMP: Mildly low bicarb with mild elevation in anion gap.  Mild hypokalemia. Salicylate level/acetaminophen levels, ethanol level: Within normal limits  ED Course:  With mildly low bicarb and elevated anion gap fluids ordered, will also check CK, oral potassium ordered.   CK: WNL  10:35: RE-EVAL: Patient asking to get up and walk around, he seems more cooperative, will remove lower extremity hard restraints and re-assess.   11:15: RE-EVAL: Sleeping, has changed into scrubs.    Restraints removed.  Brief soft BP improved with fluids.    Patient medically cleared. Consult placed to TTS.   The patient has been placed in psychiatric observation due to the need to provide a safe environment for the patient while obtaining psychiatric consultation and evaluation, as well as ongoing medical and medication management to treat the patient's condition.  The patient has been placed under full IVC at this time.  Findings and plan of care discussed with supervising physician Dr. Donnald Garre who is in agreement.   Portions of this note were generated with Scientist, clinical (histocompatibility and immunogenetics). Dictation errors may occur despite best attempts at proofreading.  Final Clinical Impression(s) / ED Diagnoses Final diagnoses:  Involuntary commitment  Acute psychosis [facility])    Rx / DC Orders ED Discharge Orders     None        Cherly Anderson, PA-C 10/25/20 1534    Arby Barrette, MD 11/01/20 1022

## 2020-10-25 NOTE — BH Assessment (Addendum)
Comprehensive Clinical Assessment (CCA) Note  10/25/2020 Brendan Hogan [id number]  Disposition: TTS Assessment completed by this Clinician. [facility] provider (Brendan Rankin, NP) recommends Continuous Assessment in the ED. [facility] provider will restart medications in the ED. Pending am psych evaluation. Patient with frequent visits to the ED, frequent hospitalizations for acute care psychiatric stabilization, hx of chronic mental health symptoms related to diagnosis of Schizoaffective Disorder, and per collateral information from parents.Marland KitchenMarland Kitchen"Patient has never been compliant with psychotropic medications". Brendan Rankin, NP, recommended a discussion with parents regarding long term care options (example: ACTT services). Clinician discussed with parents in length about available options in helping patient obtain the resources available in the community such as ACTT services. Despite multiple efforts to explain and provide education regarding services, parents were dismissive. Their only request was focused on long term treatment at a facility such as [facility]. Parents insistent on keep patient in the [facility] until [facility] will accept him. Clinician took time to explain this lengthy process as it's a complex process. Meaning patient's often stabilize prior to a bed becoming available at [facility]. Despite, providing an education on this process parents remained dismissive focusing on keeping patient in the ED even if it's for several months.   COLUMBIA-SUICIDE SEVERITY RATING SCALE (C-SSRS) completed and patient scored "No Risk". However, upon chart review patient has displayed behaviors in the ED requiring restraints. Therefore, patient is recommended to have a 1-1 sitter. Nursing Morrie Sheldon, RN) and EDP (Dr. Lebron Conners Phiffer) provided disposition updates and information regarding sitter recommendations.   The patient demonstrates the following risk factors for suicide: Chronic risk factors for suicide include: psychiatric disorder of  Schizoaffective Disorder . Acute risk factors for suicide include: family or marital conflict and patient with frequent ED visits, hospitalizations, and history of med non compliance . Protective factors for this patient include: positive social support. Considering these factors, the overall suicide risk at this point appears to be low. Patient is appropriate for outpatient follow up such as ACTT services.   COLUMBIA-SUICIDE SEVERITY RATING SCALE (C-SSRS) completed and patient scored "No Risk". However, upon chart review patient has displayed behaviors in the ED requiring restraints. Therefore, patient is recommended to have a 1-1 sitter.   Flowsheet Row ED from 10/25/2020 in [facility] EMERGENCY DEPARTMENT ED from 05/29/2020 in [facility] ED from 12/12/2019 in [facility] EMERGENCY DEPARTMENT  C-SSRS RISK CATEGORY No Risk No Risk No Risk          Chief Complaint:  Chief Complaint  Patient presents with   Psychiatric Evaluation   Visit Diagnosis: Schizophrenia   Brendan Hogan is a 25 yr old male with history of Schizoaffective Disorder he presents to [facility]. States that he is not sure why he is in the Emergency Department. He says that the police brought him from his home. He lives at home with his parents. Patient states that everything is ok at home. When asked about stressors he states, "Not really".   Patient states that he is not sure if he has a mental health diagnosis. However, states that he take a antipsychotic at night "sometimes". He reports being prescribed Ambien and Abilfy. He does not who prescribes his medications. However, believes that it could be a psychiatrist.   Patient stating that he has been hospitalized for psychiatric reasons in the past. His last hospitalization is reported as a year ago. Patient states that he has been hospitalized at [facility] in the past. Upon chart review  patient has been inpatient at [facility]  3x's ( 10/19/2019, 09/07/2019, 05/04/2020) . Patient presented to the [facility] 05/29/2020 for Insomnia.  Multiple ED visits ( 12/12/2019, 11/15/2019, 11/15/2019, 11/01/2019, 10/18/2019, 09/05/2019, 08/17/2015, 09/17/2011). The ED visits were for the following reasons: IVC, Sleep trouble, Acute psychosis, Psychosis, Agitation, etc.   Denies current suicidal ideations. Denies any prior suicidal ideations, attempts, and/or gestures. Denies self-mutilating behaviors. No access to firearms. Current depressive Symptoms:  Guilt, Lack of motivation to get out of the bed, and Insomnia. Patient states that his sleep varies. He typically can get 7 hrs of sleep. However, last night did not sleep at all.  Appetite is good. No complaints of weight loss and/or gain. No family history of mental health illnesses. No history of abuse-verbal, physical, sexual, emotional, etc.  Patient states that "everybody" is his support system. Patient is currently employed with [employer]. States that he was unable to work yesterday because he was so tired. Highest level of education is some college.   Denies HI. Denies history of aggressive and/or assaultive behaviors. When asked about legal issues he states, "I don't think so". Denies AVH's. Patient denies alcohol and/or drug use. UDS is also negative for any substances.   Collateral Information: Patient provided verbal consent to speak with his father/petitioner [name]) [phone]. Per dad, patient has been to the [facility] system multiple times due to mental health systems. States that patient has always been on compliant about medication medication with psychotropics.. States, "He has never been compliant with medications in his life". However, reports that patient will take Zolpidem prescribed by a provider at an Urgent Care. States that the provider has prescribed him 5 months of medications at a time. Patient prescribed medications 4.5 months ago and takes them daily. He will often take  more than the recommended dose. His father states that he has the medications in his possession right now. However, patient will beg for an additional pill because he says "It makes me feel good". States that patient has recently made comments: "I want someone to kill me, hit me, or take a knife and put my eyes out". States that the comments have been made multiple times over the past several months. He last made comments 2-3 days.  Yesterday, his father reports that patient picked up the couch and dropped it on his right finger. States that patient's hand is swollen at this time from this incident. Additionally, he put his finger in the closet sliding door and slammed the door on his finger. Following this incident patient told his dad, "I am strong, I am strong". Patient broke furniture in his fathers' home 3 days ago and has been doing this for the past year.  Dad states that patient has a history of hitting the walls and this has been on-going for the past year. Dad states that he has fixed holes in the wall many times. Approximately 5 days ago, his father witnessed patient talking to himself.  Patient's father states that patient has [facility] for insurance benefits. Patient's dad request that the hospital sends patient to a long term facility such as [facility].   CCA Screening, Triage and Referral (STR)  Patient Reported Information How did you hear about [place]? Other (Comment) (Phreesia 05/29/2020)  What Is the Reason for Your Visit/Call Today? Patient brought to [facility] by [name]  How Long Has This Been Causing You Problems? 1-6 months  What Do You Feel Would Help You the Most Today? Medication (Phreesia 05/29/2020)  Have You Recently Had Any Thoughts About Hurting Yourself? No  Are You Planning to Commit Suicide/Harm Yourself At This time? No   Have you Recently Had Thoughts About Hurting Someone [name]? No  Are You Planning to Harm Someone at This Time? No  Explanation: No data recorded  Have  You Used Any Alcohol or Drugs in the Past 24 Hours? No  How Long Ago Did You Use Drugs or Alcohol? No data recorded What Did You Use and How Much? No data recorded  Do You Currently Have a Therapist/Psychiatrist? Yes  Name of Therapist/Psychiatrist: He reports being prescribed Ambien and Abilfy. He does not who prescribes his medications. However, believes that it could be a psychiatrist.   Have You Been Recently Discharged From Any Office Practice or Programs? Yes  Explanation of Discharge From Practice/Program: Patient states, "Yes.Marland KitchenMarland KitchenHere"     CCA Screening Triage Referral Assessment Type of Contact: Tele-Assessment  Telemedicine Service Delivery:   Is this Initial or Reassessment? Initial Assessment  Date Telepsych consult ordered in [facility]:  10/25/20  Time Telepsych consult ordered in [facility]:  1445  Location of Assessment: [facility] ED  Provider Location: No data recorded  Collateral Involvement: Patient provides verbal consent to speak with parents. However, does not know their phone numbers.   Does Patient Have a Automotive engineer Guardian? No data recorded Name and Contact of Legal Guardian: No data recorded If Minor and Not Living with Parent(s), Who has Custody? No data recorded Is CPS involved or ever been involved? Never  Is APS involved or ever been involved? Never   Patient Determined To Be At Risk for Harm To Self or Others Based on Review of Patient Reported Information or Presenting Complaint? No  Method: No Plan  Availability of Means: No access or NA  Intent: Vague intent or NA  Notification Required: No need or identified person  Additional Information for Danger to Others Potential: No data recorded Additional Comments for Danger to Others Potential: No data recorded Are There Guns or Other Weapons in Your Home? No  Types of Guns/Weapons: No data recorded Are These Weapons Safely Secured?                            No data recorded Who Could Verify  You Are Able To Have These Secured: No data recorded Do You Have any Outstanding Charges, Pending Court Dates, Parole/Probation? Denies  Contacted To Inform of Risk of Harm To Self or Others: No data recorded   Does Patient Present under Involuntary Commitment? No  IVC Papers Initial File Date: 11/15/19   [place] of Residence: [place]   Patient Currently Receiving the Following Services: Medication Management   Determination of Need: Emergent (2 hours)   Options For Referral: Medication Management; Inpatient Hospitalization     CCA Biopsychosocial Patient Reported Schizophrenia/Schizoaffective Diagnosis in Past: Yes   Strengths: friendly   Mental Health Symptoms Depression:   Irritability   Duration of Depressive symptoms:  Duration of Depressive Symptoms: Greater than two weeks   Mania:   Change in energy/activity   Anxiety:    None   Psychosis:   Delusions; Hallucinations   Duration of Psychotic symptoms:    Trauma:   N/A   Obsessions:   N/A   Compulsions:   N/A   Inattention:   N/A   Hyperactivity/Impulsivity:   N/A   Oppositional/Defiant Behaviors:   Aggression towards people/animals; Easily annoyed   Emotional Irregularity:  Mood lability   Other Mood/Personality Symptoms:  No data recorded   Mental Status Exam Appearance and self-care  Stature:   Average   Weight:   Average weight   Clothing:   -- (n/a)   Grooming:   Normal   Cosmetic use:   None   Posture/gait:   Normal   Motor activity:   Restless   Sensorium  Attention:   Inattentive   Concentration:   Variable   Orientation:   X5   Recall/memory:   Normal   Affect and Mood  Affect:   Constricted; Appropriate   Mood:   Euthymic   Relating  Eye contact:   Normal   Facial expression:   Constricted   Attitude toward examiner:   Cooperative   Thought and Language  Speech flow:  Normal   Thought content:   Appropriate to Mood and  Circumstances   Preoccupation:   None   Hallucinations:   None   Organization:  No data recorded  Affiliated Computer ServicesExecutive Functions  Fund of Knowledge:   Average   Intelligence:   Average   Abstraction:  No data recorded  Judgement:   Impaired   Reality Testing:   Variable   Insight:   Lacking   Decision Making:   Location managerVacilates   Social Functioning  Social Maturity:   Isolates; Irresponsible   Social Judgement:   Heedless   Stress  Stressors:   Family conflict; Housing; Surveyor, quantityinancial; Work   Coping Ability:  No data recorded  Skill Deficits:   Interpersonal   Supports:   Family     Religion: Religion/Spirituality Are You A Religious Person?: No  Leisure/Recreation: Leisure / Recreation Do You Have Hobbies?: No  Exercise/Diet: Exercise/Diet Do You Exercise?: No Have You Gained or Lost A Significant Amount of Weight in the Past Six Months?: No Do You Follow a Special Diet?: No Do You Have Any Trouble Sleeping?: Yes Explanation of Sleeping Difficulties: Patient states that he has not slept in 2 days. However, he generally sleeps 7 hrs per day.   CCA Employment/Education Employment/Work Situation: Employment / Work Situation Employment Situation: Employed Work Stressors: Works for [employer] Patient's Job has Been Impacted by Current Illness: Yes Describe how Patient's Job has Been Impacted: Unablet to go to work yesterday because he felt sleepy. States that he has not been sleeping well. Has Patient ever Been in the [employer]ancorp[employer]?: No  Education: Education Is Patient Currently Attending School?: No Did You Attend College?: Yes What Type of College Degree Do you Have?: Yes; "Some college" Did You Have An Individualized Education Program (IIEP): No Did You Have Any Difficulty At School?: No Patient's Education Has Been Impacted by Current Illness: No   CCA Family/Childhood History Family and Relationship History: Family history Marital status: Single Does  patient have children?: No  Childhood History:  Childhood History By whom was/is the patient raised?: Both parents Did patient suffer any verbal/emotional/physical/sexual abuse as a child?: No Did patient suffer from severe childhood neglect?: No Has patient ever been sexually abused/assaulted/raped as an adolescent or adult?: No Was the patient ever a victim of a crime or a disaster?: No Witnessed domestic violence?: No Has patient been affected by domestic violence as an adult?: No  Child/Adolescent Assessment:     CCA Substance Use Alcohol/Drug Use: Alcohol / Drug Use Pain Medications: see MAR Prescriptions: see MAR Over the Counter: see MAR History of alcohol / drug use?: No history of alcohol / drug abuse Longest period of sobriety (  when/how long): n/a                         ASAM's:  Six Dimensions of Multidimensional Assessment  Dimension 1:  Acute Intoxication and/or Withdrawal Potential:      Dimension 2:  Biomedical Conditions and Complications:      Dimension 3:  Emotional, Behavioral, or Cognitive Conditions and Complications:     Dimension 4:  Readiness to Change:     Dimension 5:  Relapse, Continued use, or Continued Problem Potential:     Dimension 6:  Recovery/Living Environment:     ASAM Severity Score:    ASAM Recommended Level of Treatment:     Substance use Disorder (SUD)    Recommendations for Services/Supports/Treatments: Recommendations for Services/Supports/Treatments Recommendations For Services/Supports/Treatments: Medication Management, Intensive In-Home Services, ACCTT (Assertive Community Treatment), Inpatient Hospitalization, Individual Therapy  Discharge Disposition:    DSM5 Diagnoses: Patient Active Problem List   Diagnosis Date Noted   Schizoaffective disorder (HCC) 11/16/2019   Primary insomnia 11/04/2019   Schizophrenia (HCC) 10/19/2019   Psychosis (HCC) 10/19/2019   Other bipolar disorder (HCC)    Brief psychotic  disorder (HCC) 09/07/2019     Referrals to Alternative Service(s): Referred to Alternative Service(s):   Place:   Date:   Time:    Referred to Alternative Service(s):   Place:   Date:   Time:    Referred to Alternative Service(s):   Place:   Date:   Time:    Referred to Alternative Service(s):   Place:   Date:   Time:     Melynda Ripple, Counselor

## 2020-10-25 NOTE — BH Assessment (Signed)
@  1403, Requested nursing Sheria Lang ,RN)  to place TTS machine in patient's room for his initial TTS assessment.

## 2020-10-25 NOTE — ED Notes (Signed)
Pt now more cooperative, asking to put on scrubs.  Pt ankles taken out of restraints and placed into purple scrubs

## 2020-10-25 NOTE — ED Triage Notes (Signed)
Pt bib [name] with IVC paperwork. Pt IVCd by father for being suicidal and homicidal. Pt father states that pt has not been taking medications for a couple weeks and has been having increased agitation since.  Pt arrives with 3 [name] officers in handcuffs and ankle restraints.

## 2020-10-25 NOTE — ED Notes (Addendum)
Spoke to pt father in lobby and he is requesting for pt to be admitted to a long term facility since he has not been taking his medications for a couple weeks and has not slept in two days. He says that he is afraid of what pt would do to self and parents once he gets out.  Father [facility]- number in chart) would like to be updated.

## 2020-10-25 NOTE — ED Notes (Signed)
Explained process to pt and attempted to get pt in scrubs, pt started to kick at staff and became verbally abusive to staff. Pt then restrained by [name] and placed in 4 point violent restraints. 20mg  Geodon given.

## 2020-10-25 NOTE — ED Notes (Signed)
Staffing called for sitter. No sitter available until 1100.

## 2020-10-26 DIAGNOSIS — F200 Paranoid schizophrenia: Secondary | ICD-10-CM

## 2020-10-26 MED ORDER — ZIPRASIDONE MESYLATE 20 MG IM SOLR
20.0000 mg | Freq: Once | INTRAMUSCULAR | Status: AC
  Administered 2020-10-26: 20 mg via INTRAMUSCULAR
  Filled 2020-10-26: qty 20

## 2020-10-26 MED ORDER — STERILE WATER FOR INJECTION IJ SOLN
INTRAMUSCULAR | Status: AC
  Filled 2020-10-26: qty 10

## 2020-10-26 MED ORDER — RISPERIDONE 2 MG PO TBDP: 2.0000 mg | ORAL_TABLET | Freq: Three times a day (TID) | ORAL | Status: DC | PRN

## 2020-10-26 NOTE — ED Provider Notes (Signed)
Emergency Medicine Observation Re-evaluation Note  Brendan Hogan is a 25 y.o. male, seen on rounds today.  Pt initially presented to the ED for complaints of Psychiatric Evaluation Currently, the patient is reassessment by psychiatry.  Physical Exam  BP 102/70 (BP Location: Right Arm)   Pulse 83   Temp 97.8 F (36.6 C) (Oral)   Resp 19   Ht 1.803 m (5\' 11" )   Wt 56.4 kg   SpO2 100%   BMI 17.34 kg/m  Physical Exam General: resting Cardiac: regular rate Lungs: Breathing easily Psych: Calm, no agitation or aggression  ED Course / MDM  EKG:EKG Interpretation  Date/Time:  Wednesday October 25 2020 08:13:10 EDT Ventricular Rate:  109 PR Interval:  126 QRS Duration: 87 QT Interval:  328 QTC Calculation: 442 R Axis:   44 Text Interpretation: Sinus tachycardia Borderline T abnormalities, anterior leads When compared with ECG of 11/20/2019, HEART RATE has increased Nonspecific T wave abnormality is now present Confirmed by 11/22/2019 (Dione Booze) on 10/26/2020 1:45:53 AM  I have reviewed the labs performed to date as well as medications administered while in observation.  Recent changes in the last 24 hours include initial psychiatric assessment.  No behavioral events overnight.  Plan  Current plan is for psychiatric reassessment. Patient is under full IVC at this time.   10/28/2020, MD 10/26/20 [id number]

## 2020-10-26 NOTE — ED Notes (Signed)
Pt belongings in locker 12 °

## 2020-10-26 NOTE — ED Provider Notes (Signed)
Patient attempted to run out of the ED.  We will give a dose of Geodon.   Linwood Dibbles, MD 10/26/20 1318

## 2020-10-26 NOTE — Progress Notes (Signed)
Patient has been faxed out due to no beds being available at [facility]. Patient meets inpatient criteria per Doran Heater ,NP. Patient referred to the following facilities:  [facility]  [address] [place] [address] [phone]  [facility]  [address] [place] [address] [phone]  [facility]  [address], [place] [address] [phone]  [facility]  [address], [place] [address] [phone]  [facility]  [address] [place] [address] [phone]  [facility]  [address] [place], [place] [address] [phone]  [facility]  [address] [place], [place] [address] [phone]  [facility]  [address] [place] [address] [phone]  [facility]  [address] [place] [address] [phone]  [facility]  [address] [place] [address] [phone]     CSW will continue to monitor disposition.    Damita Dunnings, MSW, LCSW-A  12:08 PM 10/26/2020

## 2020-10-26 NOTE — Consult Note (Signed)
Telepsych Consultation   Reason for Consult: Psychiatry reassessment Referring Physician:   Location of Patient: [name] emergency department Location of Provider: Behavioral Health TTS Department  Patient Identification: Brendan Hogan MRN:  [id number] Principal Diagnosis: Schizophrenia [facility]) Diagnosis:  Principal Problem:   Schizophrenia (HCC)   Total Time spent with patient: 20 minutes  Subjective:   Brendan Hogan is a 25 y.o. male patient.  Patient states "I just want to go home, I am not schizophrenic, my dad was just mad at me and that is why he called the cops to bring me here, they are mad at me because of domestic problems, I know my mom is sad and crying right now because she wants to be back home."  HPI:   Brendan Hogan is reassessed by nurse practitioner.  He is alert and oriented, participates appropriately in assessment.  He is seated in hospital room, watching television prior to assessment. He continues to report readiness to discharge home.    He appears to lack insight regarding his diagnosis and medication management.  He has been diagnosed with schizophrenia and schizoaffective disorder.  He reports history of insomnia and is compliant with the medication to help him sleep, Ambien.  He continues to state "I do not have schizophrenia, I do not need any medications except medication to help me sleep."  He he presents with anxious mood and labile affect, raising voice while insisting he does not require medication.  He endorses auditory and visual hallucinations in the past states that "it does not happen anymore but usually in the mornings if I am sleeping I think maybe I have sleep paralysis, when I am trying to wake up to get paralyzed and cannot get up too easily."  He denies auditory and visual hallucinations currently.  There is no indication that he is responding to internal stimuli.  He exhibits symptoms of paranoia when discussing his diagnoses.  He is not currently  followed by outpatient psychiatry and does not believe he would benefit from outpatient psychiatric care.  Brendan Hogan resides in [place] with his parents.  He denies access to weapons.  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  He denies alcohol and substance use.  He endorses average sleep and appetite.  Patient provided support and encouragement.  Discussed treatment plan to include inpatient psychiatric care.    Past Psychiatric History: Schizophrenia, schizoaffective disorder  Risk to Self: Denies Risk to Others: Denies Prior Inpatient Therapy:   Inpatient psychiatric treatment at [facility] behavioral health 10/2019 Prior Outpatient Therapy:     Past Medical History:  Past Medical History:  Diagnosis Date   Psychosis (HCC) 05/2019   [facility]    Seasonal allergies     Past Surgical History:  Procedure Laterality Date   TONSILLECTOMY     Family History:  Family History  Problem Relation Age of Onset   Hypertension Mother    Family Psychiatric  History: none reported Social History:  Social History   Substance and Sexual Activity  Alcohol Use Not Currently   Comment: rare     Social History   Substance and Sexual Activity  Drug Use No    Social History   Socioeconomic History   Marital status: Single    Spouse name: Not on file   Number of children: Not on file   Years of education: Not on file   Highest education level: Not on file  Occupational History   Not on file  Tobacco Use   Smoking status: Every Day    Packs/day: 1.00    Pack years: 0.00    Types: Cigarettes   Smokeless tobacco: Former  Building services engineerVaping Use   Vaping Use: Never used  Substance and Sexual Activity   Alcohol use: Not Currently    Comment: rare   Drug use: No   Sexual activity: Not Currently  Other Topics Concern   Not on file  Social History Narrative   Not on file   Social Determinants of Health   Financial Resource Strain: Not on file  Food Insecurity: Not on  file  Transportation Needs: Not on file  Physical Activity: Not on file  Stress: Not on file  Social Connections: Not on file   Additional Social History:    Allergies:  No Known Allergies  Labs:  Results for orders placed or performed during the hospital encounter of 10/25/20 (from the past 48 hour(s))  CBC     Status: None   Collection Time: 10/25/20  7:51 AM  Result Value Ref Range   WBC 8.7 4.0 - 10.5 K/uL   RBC 5.60 4.22 - 5.81 MIL/uL   Hemoglobin 16.5 13.0 - 17.0 g/dL   HCT 16.148.7 09.639.0 - 04.552.0 %   MCV 87.0 80.0 - 100.0 fL   MCH 29.5 26.0 - 34.0 pg   MCHC 33.9 30.0 - 36.0 g/dL   RDW 40.912.2 81.111.5 - 91.415.5 %   Platelets 251 150 - 400 K/uL   nRBC 0.0 0.0 - 0.2 %    Comment: Performed at [facility], [address] [place], [place] [address]  Comprehensive metabolic panel     Status: Abnormal   Collection Time: 10/25/20  7:51 AM  Result Value Ref Range   Sodium 138 135 - 145 mmol/L   Potassium 3.3 (L) 3.5 - 5.1 mmol/L   Chloride 105 98 - 111 mmol/L   CO2 16 (L) 22 - 32 mmol/L   Glucose, Bld 155 (H) 70 - 99 mg/dL    Comment: Glucose reference range applies only to samples taken after fasting for at least 8 hours.   BUN 12 6 - 20 mg/dL   Creatinine, Ser 6.211.18 0.61 - 1.24 mg/dL   Calcium 9.7 8.9 - 30.810.3 mg/dL   Total Protein 7.2 6.5 - 8.1 g/dL   Albumin 4.5 3.5 - 5.0 g/dL   AST 23 15 - 41 U/L   ALT 11 0 - 44 U/L   Alkaline Phosphatase 100 38 - 126 U/L   Total Bilirubin 1.1 0.3 - 1.2 mg/dL   GFR, Estimated >65>60 >78>60 mL/min    Comment: (NOTE) Calculated using the CKD-EPI Creatinine Equation (2021)    Anion gap 17 (H) 5 - 15    Comment: Performed at [facility], [address] [place], [place] [address]  Ethanol     Status: None   Collection Time: 10/25/20  7:51 AM  Result Value Ref Range   Alcohol, Ethyl (B) <10 <10 mg/dL    Comment: (NOTE) Lowest detectable limit for serum alcohol is 10 mg/dL.  For medical purposes only. Performed at [facility], [address] [place], [place] [address]   Salicylate level     Status: Abnormal   Collection Time: 10/25/20  7:51 AM  Result Value Ref Range   Salicylate Lvl <7.0 (L) 7.0 - 30.0 mg/dL    Comment: Performed at [facility], [address] [place], [place] [address]  Acetaminophen  level     Status: Abnormal   Collection Time: 10/25/20  7:51 AM  Result Value Ref Range   Acetaminophen (Tylenol), Serum <10 (L) 10 - 30 ug/mL    Comment: (NOTE) Therapeutic concentrations vary significantly. A range of 10-30 ug/mL  may be an effective concentration for many patients. However, some  are best treated at concentrations outside of this range. Acetaminophen concentrations >150 ug/mL at 4 hours after ingestion  and >50 ug/mL at 12 hours after ingestion are often associated with  toxic reactions.  Performed at [facility], [address], [place], [place] [address]   CK     Status: None   Collection Time: 10/25/20  7:51 AM  Result Value Ref Range   Total CK 112 49 - 397 U/L    Comment: Performed at [facility], [address], [place], [place] [address]  Resp Panel by RT-PCR (Flu A&B, Covid) Nasopharyngeal Swab     Status: None   Collection Time: 10/25/20  8:00 AM   Specimen: Nasopharyngeal Swab; Nasopharyngeal(NP) swabs in vial transport medium  Result Value Ref Range   SARS Coronavirus 2 by RT PCR NEGATIVE NEGATIVE    Comment: (NOTE) SARS-CoV-2 target nucleic acids are NOT DETECTED.  The SARS-CoV-2 RNA is generally detectable in upper respiratory specimens during the acute phase of infection. The lowest concentration of SARS-CoV-2 viral copies this assay can detect is 138 copies/mL. A negative result does not preclude SARS-Cov-2 infection and should not be used as the sole basis for treatment or other patient management decisions. A negative result may occur with  improper specimen collection/handling, submission of specimen other than nasopharyngeal swab,  presence of viral mutation(s) within the areas targeted by this assay, and inadequate number of viral copies(<138 copies/mL). A negative result must be combined with clinical observations, patient history, and epidemiological information. The expected result is Negative.  Fact Sheet for Patients:  BloggerCourse.com  Fact Sheet for Healthcare Providers:  SeriousBroker.it  This test is no t yet approved or cleared by the [place] FDA and  has been authorized for detection and/or diagnosis of SARS-CoV-2 by FDA under an Emergency Use Authorization (EUA). This EUA will remain  in effect (meaning this test can be used) for the duration of the COVID-19 declaration under Section 564(b)(1) of the Act, 21 U.S.C.section 360bbb-3(b)(1), unless the authorization is terminated  or revoked sooner.       Influenza A by PCR NEGATIVE NEGATIVE   Influenza B by PCR NEGATIVE NEGATIVE    Comment: (NOTE) The Xpert Xpress SARS-CoV-2/FLU/RSV plus assay is intended as an aid in the diagnosis of influenza from Nasopharyngeal swab specimens and should not be used as a sole basis for treatment. Nasal washings and aspirates are unacceptable for Xpert Xpress SARS-CoV-2/FLU/RSV testing.  Fact Sheet for Patients: BloggerCourse.com  Fact Sheet for Healthcare Providers: SeriousBroker.it  This test is not yet approved or cleared by the [place] FDA and has been authorized for detection and/or diagnosis of SARS-CoV-2 by FDA under an Emergency Use Authorization (EUA). This EUA will remain in effect (meaning this test can be used) for the duration of the COVID-19 declaration under Section 564(b)(1) of the Act, 21 U.S.C. section 360bbb-3(b)(1), unless the authorization is terminated or revoked.  Performed at [facility], [address] [place], [place] [address]   Urine rapid drug screen (hosp  performed)     Status: None   Collection Time: 10/25/20  9:27 AM  Result Value  Ref Range   Opiates NONE DETECTED NONE DETECTED   Cocaine NONE DETECTED NONE DETECTED   Benzodiazepines NONE DETECTED NONE DETECTED   Amphetamines NONE DETECTED NONE DETECTED   Tetrahydrocannabinol NONE DETECTED NONE DETECTED   Barbiturates NONE DETECTED NONE DETECTED    Comment: (NOTE) DRUG SCREEN FOR MEDICAL PURPOSES ONLY.  IF CONFIRMATION IS NEEDED FOR ANY PURPOSE, NOTIFY LAB WITHIN 5 DAYS.  LOWEST DETECTABLE LIMITS FOR URINE DRUG SCREEN Drug Class                     Cutoff (ng/mL) Amphetamine and metabolites    1000 Barbiturate and metabolites    200 Benzodiazepine                 200 Tricyclics and metabolites     300 Opiates and metabolites        300 Cocaine and metabolites        300 THC                            50 Performed at [facility], [address], [place], [place] [address]     Medications:  Current Facility-Administered Medications  Medication Dose Route Frequency Provider Last Rate Last Admin   acetaminophen (TYLENOL) tablet 650 mg  650 mg Oral Q4H PRN Petrucelli, Samantha R, PA-C       alum & mag hydroxide-simeth (MAALOX/MYLANTA) 200-200-20 MG/5ML suspension 30 mL  30 mL Oral Q6H PRN Petrucelli, Samantha R, PA-C       benztropine (COGENTIN) tablet 0.5 mg  0.5 mg Oral BID Rankin, Shuvon B, NP   0.5 mg at 10/25/20 1542   haloperidol (HALDOL) tablet 5 mg  5 mg Oral BID Rankin, Shuvon B, NP   5 mg at 10/25/20 1521   hydrOXYzine (ATARAX/VISTARIL) tablet 50 mg  50 mg Oral QHS Rankin, Shuvon B, NP       LORazepam (ATIVAN) injection 1 mg  1 mg Intravenous Once PRN Petrucelli, Samantha R, PA-C       nicotine (NICODERM CQ - dosed in mg/24 hours) patch 21 mg  21 mg Transdermal Daily PRN Petrucelli, Samantha R, PA-C       QUEtiapine (SEROQUEL) tablet 100 mg  100 mg Oral QHS Rankin, Shuvon B, NP       risperiDONE (RISPERDAL M-TABS) disintegrating tablet 2 mg  2 mg Oral Q8H PRN  Linwood Dibbles, MD       zolpidem (AMBIEN) tablet 5 mg  5 mg Oral QHS PRN Petrucelli, Pleas Koch, PA-C       Current Outpatient Medications  Medication Sig Dispense Refill   benztropine (COGENTIN) 0.5 MG tablet Take 1 tablet (0.5 mg total) by mouth 2 (two) times daily. (Patient not taking: Reported on 10/25/2020) 60 tablet 0   carbamazepine (TEGRETOL) 200 MG tablet Take 1 tablet (200 mg total) by mouth every morning. (Patient not taking: No sig reported) 30 tablet 0   carbamazepine (TEGRETOL) 200 MG tablet Take 1.5 tablets (300 mg total) by mouth at bedtime. (Patient not taking: No sig reported) 30 tablet 0   haloperidol (HALDOL) 10 MG tablet Take 1 tablet (10 mg total) by mouth 2 (two) times daily. (Patient not taking: No sig reported) 60 tablet 0   hydrOXYzine (ATARAX/VISTARIL) 25 MG tablet Take 2 tablets (50 mg total) by mouth at bedtime. (Patient not taking: Reported on 10/25/2020) 30 tablet 1   nicotine polacrilex (NICORETTE) 2 MG gum  Take 1 each (2 mg total) by mouth as needed for smoking cessation. (Patient not taking: No sig reported) 100 tablet 0   QUEtiapine (SEROQUEL) 100 MG tablet Take 1 tablet (100 mg total) by mouth at bedtime for 14 days. (Patient not taking: Reported on 10/25/2020) 14 tablet 0   risperidone (RISPERDAL M-TABS) 3 MG disintegrating tablet Take 1 tablet (3 mg total) by mouth at bedtime. (Patient not taking: No sig reported) 30 tablet 0   risperiDONE microspheres (RISPERDAL CONSTA) 25 MG injection Inject 2 mLs (25 mg total) into the muscle every 14 (fourteen) days. (Patient not taking: No sig reported) 1 each 0   zolpidem (AMBIEN) 10 MG tablet Take 10 mg by mouth daily. (Patient not taking: Reported on 10/25/2020)      Musculoskeletal: Strength & Muscle Tone: within normal limits Gait & Station: normal Patient leans: N/A          Psychiatric Specialty Exam:  Presentation  General Appearance: Appropriate for Environment; Casual  Eye Contact:Fair  Speech:Clear  and Coherent; Normal Rate  Speech Volume:Normal  Handedness: No data recorded  Mood and Affect  Mood:Irritable  Affect:Appropriate; Congruent   Thought Process  Thought Processes:Linear; Goal Directed  Descriptions of Associations:Intact  Orientation:Full (Time, Place and Person)  Thought Content:WDL  History of Schizophrenia/Schizoaffective disorder:Yes  Duration of Psychotic Symptoms:No data recorded Hallucinations:No data recorded Ideas of Reference:None  Suicidal Thoughts:No data recorded Homicidal Thoughts:No data recorded  Sensorium  Memory:Immediate Good; Recent Good; Remote Fair  Judgment:Fair  Insight:Fair   Executive Functions  Concentration:Fair  Attention Span:Fair  Recall:Fair  Fund of Knowledge:Fair  Language:Fair   Psychomotor Activity  Psychomotor Activity: No data recorded  Assets  Assets:Communication Skills; Desire for Improvement; Housing; Resilience   Sleep  Sleep: No data recorded   Physical Exam: Physical Exam Vitals and nursing note reviewed.  Constitutional:      Appearance: Normal appearance. He is normal weight.  HENT:     Head: Normocephalic and atraumatic.     Nose: Nose normal.  Cardiovascular:     Rate and Rhythm: Normal rate.  Pulmonary:     Effort: Pulmonary effort is normal.  Musculoskeletal:        General: Normal range of motion.     Cervical back: Normal range of motion.  Neurological:     Mental Status: He is alert and oriented to person, place, and time.  Psychiatric:        Attention and Perception: Attention and perception normal.        Mood and Affect: Mood is anxious. Affect is labile.        Speech: Speech normal.        Behavior: Behavior is cooperative.        Thought Content: Thought content is paranoid.        Cognition and Memory: Cognition and memory normal.   Review of Systems  Constitutional: Negative.   HENT: Negative.    Eyes: Negative.   Respiratory: Negative.     Cardiovascular: Negative.   Gastrointestinal: Negative.   Genitourinary: Negative.   Musculoskeletal: Negative.   Skin: Negative.   Neurological: Negative.   Endo/Heme/Allergies: Negative.   Psychiatric/Behavioral:  The patient is nervous/anxious.   Blood pressure 102/70, pulse 83, temperature 97.8 F (36.6 C), temperature source Oral, resp. rate 19, height  (1.803 m), weight 56.4 kg, SpO2 100 %. Body mass index is 17.34 kg/m.  Treatment Plan Summary: Patient reviewed with Dr Bronwen Betters. Daily contact with patient to assess and  evaluate symptoms and progress in treatment 10/25/2020 QTcB measures 442 Current medications: -Haloperidol 5 mg twice daily -Benztropine 0.5 mg twice daily -Quetiapine 100 mg nightly As needed medication: -Zolpidem 5 mg nightly as needed/sleep -Risperidone 2 mg every 8 as needed/agitation   Disposition: Recommend psychiatric Inpatient admission when medically cleared. Supportive therapy provided about ongoing stressors.  This service was provided via telemedicine using a 2-way, interactive audio and video technology.  Names of all persons participating in this telemedicine service and their role in this encounter. Name: Doristine Mango Role: Patient  Name: Doran Heater Role: FNP  Name: Dr Bronwen Betters Role: Psychiatry    Lenard Lance, FNP 10/26/2020 10:34 AM

## 2020-10-26 NOTE — ED Notes (Signed)
Pt woke up asking staff about when he is going home. Pt notified that he is not allowed to go home due to being IVC. Pt notified that the team has decided that he will be staying until a bed at another facility is available. Pt upset and cussing about situation. This RN calmed pt down and explained situation again.

## 2020-10-26 NOTE — ED Notes (Signed)
Pt spoke to father on phone already and is now talking to mother on phone at nurses station Pt notified that this will be his last phone call for the day

## 2020-10-26 NOTE — ED Notes (Signed)
Patient up to use the restroom at this time.  Patient ambulatory with steady gait.

## 2020-10-26 NOTE — ED Notes (Addendum)
Pt walking in hallway towards bathroom. Instead of going to bathroom pt runs out of pod. Runs into green zone and is caught by [name]. Brought back to room with security and Geodon given per MD Lynelle Doctor.

## 2020-10-26 NOTE — ED Notes (Signed)
Pt up to bathroom.

## 2020-10-26 NOTE — ED Notes (Signed)
Pt resting with blanket over head in bed at this time

## 2020-10-26 NOTE — ED Notes (Signed)
Report received. Pt currently resting with eyes closed and sitter at bedside

## 2020-10-27 MED ORDER — HYDROXYZINE HCL 50 MG PO TABS: 50.0000 mg | ORAL_TABLET | Freq: Every evening | ORAL | Status: DC | PRN

## 2020-10-27 MED ORDER — TRAZODONE HCL 50 MG PO TABS: 50.0000 mg | ORAL_TABLET | Freq: Every evening | ORAL | Status: DC | PRN

## 2020-10-27 MED ORDER — HALOPERIDOL DECANOATE 100 MG/ML IM SOLN
100.0000 mg | Freq: Once | INTRAMUSCULAR | Status: AC
  Administered 2020-10-27: 100 mg via INTRAMUSCULAR
  Filled 2020-10-27: qty 1

## 2020-10-27 NOTE — BH Assessment (Signed)
Disposition:   Per Doran Heater, NP, patient does not meet criteria for a psychiatric inpatient admission. He is psych cleared. Disposition Clinician has provided referral information noted on patient's AVS to the [facility] outpatient department (2nd floor) to maintain his LAI. Patient is encouraged to schedule a follow up appointment asap.   Also, referrals to ACTT providers in the community were noted on patient's AVS with a recommendation to consider applying for MCD as this is a requirement for this level of care.  Patient to be discharged home to the care of his support system (parents).

## 2020-10-27 NOTE — ED Provider Notes (Signed)
  Physical Exam  BP 125/70   Pulse 95   Temp 97.9 F (36.6 C) (Oral)   Resp 18   Ht 5\' 11"  (1.803 m)   Wt 56.4 kg   SpO2 100%   BMI 17.34 kg/m   Physical Exam  ED Course/Procedures     Procedures  MDM  Patient cleared by psych. Stable for dc home per psych team. Please see their note. IVC rescinded by me    , MD 10/27/20 [phone]

## 2020-10-27 NOTE — Progress Notes (Signed)
Patient has been faxed out due to no bed availability at [facility]. Patient meets inpatient criteria per Doran Heater ,NP. Patient referred to the following facilities:  [facility]  [address] [place] [address] [phone]  [facility]  [address], [place] [address] [phone]  [facility]  [address] [place] [address] [phone]  [facility]  [address], [place] [address] [phone]  [facility]  [address] [place] [address] [phone]  [facility]  [address] [place], [place] [address] [phone]  [facility]  [address] [place], [place] [address] [phone]  [facility]  [address] [place] [address] [phone]  [facility]  [address], [place] [address] [phone]  [facility]  [address], [place] [address] [phone]    CSW will continue to monitor disposition.    Damita Dunnings, MSW, LCSW-A  8:26 AM 10/27/2020

## 2020-10-27 NOTE — ED Provider Notes (Signed)
Emergency Medicine Observation Re-evaluation Note  EANN CLELAND is a 25 y.o. male, seen on rounds today.  Pt initially presented to the ED for complaints of Psychiatric Evaluation Currently, the patient is resting.  Physical Exam  BP 98/63 (BP Location: Right Arm)   Pulse 95   Temp 97.8 F (36.6 C) (Oral)   Resp 18   Ht 5\' 11"  (1.803 m)   Wt 56.4 kg   SpO2 100%   BMI 17.34 kg/m  Physical Exam General: resting comfortably, NAD Lungs: normal WOB Psych: currently calm and resting ED Course / MDM  EKG:EKG Interpretation  Date/Time:  Wednesday October 25 2020 08:13:10 EDT Ventricular Rate:  109 PR Interval:  126 QRS Duration: 87 QT Interval:  328 QTC Calculation: 442 R Axis:   44 Text Interpretation: Sinus tachycardia Borderline T abnormalities, anterior leads When compared with ECG of 11/20/2019, HEART RATE has increased Nonspecific T wave abnormality is now present Confirmed by 11/22/2019 (Dione Booze) on 10/26/2020 1:45:53 AM  I have reviewed the labs performed to date as well as medications administered while in observation.  Recent changes in the last 24 hours include none.  Plan  Current plan is for inpatient placement, pending placement. Patient is under full IVC at this time.   10/28/2020, Rozelle Logan 10/27/20 12/28/20

## 2020-10-27 NOTE — ED Notes (Signed)
Pharmacy notified of medication order, med not available from pharmacy.

## 2020-10-27 NOTE — Discharge Instructions (Signed)
Continue your current meds   Follow up with psychiatry   Return to ER if you have thoughts of harming yourself or others, hallucinations

## 2020-10-27 NOTE — Consult Note (Signed)
Telepsych Consultation   Reason for Consult:  Psychiatry Reassessment Referring Physician:   Location of Patient: [name] Location of Provider: Behavioral Health TTS Department  Patient Identification: Brendan Hogan MRN:  [id number] Principal Diagnosis: Schizophrenia [facility]) Diagnosis:  Principal Problem:   Schizophrenia (HCC)   Total Time spent with patient: 30 minutes  Subjective:   Brendan Hogan is a 25 y.o. male patient admitted with involuntary commitment petition.  Patient states "I will take the medication, I just want to go home."  HPI:   Patient is reassessed by nurse practitioner.  He is alert and oriented, answers appropriately.  He is pleasant and cooperative during assessment. Pranit appears more insightful regarding diagnosis and treatment plan today.  He reports he would be willing to remain compliant with medications once discharged.  He appears to appreciate importance of medications.  He continues to deny suicidal and homicidal ideations.  He contracts verbally for safety with this Clinical research associate.  There is no evidence of delusional thought content and no indication that patient is responding to internal stimuli.He denies paranoia currently.  Brendan Hogan has been diagnosed with schizophrenia.  He is followed by outpatient psychiatry through [facility].  Prior to arrival he had stopped taking his antipsychotic medications.  He resides in [place] with his mother and father.  He denies access to weapons.  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  He denies alcohol and substance use.  He endorses average sleep and appetite.  Patient offered support and encouragement.  He gives verbal consent to speak with his father, Brendan Hogan phone # [phone].  Spoke with patient's father who reports "since he began taking zolpidem he has changed a lot, he is not taking it as prescribed, he read on [employer] that if you take 2 a day it is like drinking alcohol.  He  is taking more than prescribed." Patient's father reports he has confiscated the zolpidem and now gives this medication to [place] as needed. Per patient's father anytime Brendan Hogan is seen by: Family primary care, outpatient psychiatry or urgent care he states "I need Ambien." Patient's father reports Brendan Hogan is followed by outpatient psychiatry through [facility], [place].   Past Psychiatric History: Schizophrenia, brief psychotic disorder, psychosis, schizoaffective disorder  Risk to Self:   Denies Risk to Others:   Denies Prior Inpatient Therapy:   Prior Outpatient Therapy:   Currently followed by [facility], [facility]  Past Medical History:  Past Medical History:  Diagnosis Date   Psychosis (HCC) 05/2019   [facility] [place]    Seasonal allergies     Past Surgical History:  Procedure Laterality Date   TONSILLECTOMY     Family History:  Family History  Problem Relation Age of Onset   Hypertension Mother    Family Psychiatric  History: None reported Social History:  Social History   Substance and Sexual Activity  Alcohol Use Not Currently   Comment: rare     Social History   Substance and Sexual Activity  Drug Use No    Social History   Socioeconomic History   Marital status: Single    Spouse name: Not on file   Number of children: Not on file   Years of education: Not on file   Highest education level: Not on file  Occupational History   Not on file  Tobacco Use   Smoking status: Every Day    Packs/day: 1.00    Pack years: 0.00    Types:  Cigarettes   Smokeless tobacco: Former  Building services engineerVaping Use   Vaping Use: Never used  Substance and Sexual Activity   Alcohol use: Not Currently    Comment: rare   Drug use: No   Sexual activity: Not Currently  Other Topics Concern   Not on file  Social History Narrative   Not on file   Social Determinants of Health   Financial Resource Strain: Not on file  Food Insecurity: Not on file  Transportation  Needs: Not on file  Physical Activity: Not on file  Stress: Not on file  Social Connections: Not on file   Additional Social History:    Allergies:  No Known Allergies  Labs: No results found for this or any previous visit (from the past 48 hour(s)).  Medications:  Current Facility-Administered Medications  Medication Dose Route Frequency Provider Last Rate Last Admin   acetaminophen (TYLENOL) tablet 650 mg  650 mg Oral Q4H PRN Petrucelli, Samantha R, PA-C       alum & mag hydroxide-simeth (MAALOX/MYLANTA) 200-200-20 MG/5ML suspension 30 mL  30 mL Oral Q6H PRN Petrucelli, Samantha R, PA-C       benztropine (COGENTIN) tablet 0.5 mg  0.5 mg Oral BID Rankin, Shuvon B, NP   0.5 mg at 10/27/20 1007   haloperidol (HALDOL) tablet 5 mg  5 mg Oral BID Rankin, Shuvon B, NP   5 mg at 10/27/20 0930   hydrOXYzine (ATARAX/VISTARIL) tablet 50 mg  50 mg Oral QHS Rankin, Shuvon B, NP   50 mg at 10/26/20 2154   LORazepam (ATIVAN) injection 1 mg  1 mg Intravenous Once PRN Petrucelli, Samantha R, PA-C       nicotine (NICODERM CQ - dosed in mg/24 hours) patch 21 mg  21 mg Transdermal Daily PRN Petrucelli, Samantha R, PA-C       QUEtiapine (SEROQUEL) tablet 100 mg  100 mg Oral QHS Rankin, Shuvon B, NP   100 mg at 10/26/20 2154   risperiDONE (RISPERDAL M-TABS) disintegrating tablet 2 mg  2 mg Oral Q8H PRN Linwood DibblesKnapp, Jon, MD       zolpidem (AMBIEN) tablet 5 mg  5 mg Oral QHS PRN Petrucelli, Pleas KochSamantha R, PA-C       Current Outpatient Medications  Medication Sig Dispense Refill   benztropine (COGENTIN) 0.5 MG tablet Take 1 tablet (0.5 mg total) by mouth 2 (two) times daily. (Patient not taking: Reported on 10/25/2020) 60 tablet 0   carbamazepine (TEGRETOL) 200 MG tablet Take 1 tablet (200 mg total) by mouth every morning. (Patient not taking: No sig reported) 30 tablet 0   carbamazepine (TEGRETOL) 200 MG tablet Take 1.5 tablets (300 mg total) by mouth at bedtime. (Patient not taking: No sig reported) 30 tablet 0    haloperidol (HALDOL) 10 MG tablet Take 1 tablet (10 mg total) by mouth 2 (two) times daily. (Patient not taking: No sig reported) 60 tablet 0   hydrOXYzine (ATARAX/VISTARIL) 25 MG tablet Take 2 tablets (50 mg total) by mouth at bedtime. (Patient not taking: Reported on 10/25/2020) 30 tablet 1   nicotine polacrilex (NICORETTE) 2 MG gum Take 1 each (2 mg total) by mouth as needed for smoking cessation. (Patient not taking: No sig reported) 100 tablet 0   QUEtiapine (SEROQUEL) 100 MG tablet Take 1 tablet (100 mg total) by mouth at bedtime for 14 days. (Patient not taking: Reported on 10/25/2020) 14 tablet 0   risperidone (RISPERDAL M-TABS) 3 MG disintegrating tablet Take 1 tablet (3 mg total) by mouth  at bedtime. (Patient not taking: No sig reported) 30 tablet 0   risperiDONE microspheres (RISPERDAL CONSTA) 25 MG injection Inject 2 mLs (25 mg total) into the muscle every 14 (fourteen) days. (Patient not taking: No sig reported) 1 each 0   zolpidem (AMBIEN) 10 MG tablet Take 10 mg by mouth daily. (Patient not taking: Reported on 10/25/2020)      Musculoskeletal: Strength & Muscle Tone: within normal limits Gait & Station: normal Patient leans: N/A          Psychiatric Specialty Exam:  Presentation  General Appearance: Appropriate for Environment; Casual  Eye Contact:Fair  Speech:Clear and Coherent; Normal Rate  Speech Volume:Normal  Handedness: No data recorded  Mood and Affect  Mood:Irritable  Affect:Appropriate; Congruent   Thought Process  Thought Processes:Linear; Goal Directed  Descriptions of Associations:Intact  Orientation:Full (Time, Place and Person)  Thought Content:WDL  History of Schizophrenia/Schizoaffective disorder:Yes  Duration of Psychotic Symptoms:No data recorded Hallucinations:No data recorded Ideas of Reference:None  Suicidal Thoughts:No data recorded Homicidal Thoughts:No data recorded  Sensorium  Memory:Immediate Good; Recent Good; Remote  Fair  Judgment:Fair  Insight:Fair   Executive Functions  Concentration:Fair  Attention Span:Fair  Recall:Fair  Fund of Knowledge:Fair  Language:Fair   Psychomotor Activity  Psychomotor Activity: No data recorded  Assets  Assets:Communication Skills; Desire for Improvement; Housing; Resilience   Sleep  Sleep: No data recorded   Physical Exam: Physical Exam Vitals and nursing note reviewed.  Constitutional:      Appearance: Normal appearance. He is normal weight.  HENT:     Head: Normocephalic and atraumatic.     Nose: Nose normal.  Cardiovascular:     Rate and Rhythm: Normal rate.  Pulmonary:     Effort: Pulmonary effort is normal.  Musculoskeletal:        General: Normal range of motion.     Cervical back: Normal range of motion.  Neurological:     Mental Status: He is alert and oriented to person, place, and time.  Psychiatric:        Attention and Perception: Attention and perception normal.        Mood and Affect: Mood and affect normal.        Speech: Speech normal.        Behavior: Behavior normal. Behavior is cooperative.        Thought Content: Thought content normal.        Cognition and Memory: Cognition and memory normal.        Judgment: Judgment normal.   Review of Systems  Constitutional: Negative.   HENT: Negative.    Eyes: Negative.   Respiratory: Negative.    Cardiovascular: Negative.   Gastrointestinal: Negative.   Genitourinary: Negative.   Musculoskeletal: Negative.   Skin: Negative.   Neurological: Negative.   Endo/Heme/Allergies: Negative.   Psychiatric/Behavioral: Negative.    Blood pressure 98/63, pulse 95, temperature 97.8 F (36.6 C), temperature source Oral, resp. rate 18, height 5\' 11"  (1.803 m), weight 56.4 kg, SpO2 100 %. Body mass index is 17.34 kg/m.  Treatment Plan Summary: Patient reviewed with Dr. . QTc measures 442 on 10/26/2020, no prolongation noted, patient tolerates Haloperidol PO x 3  doses.  Initiated Long acting injectable: -Haloperidol decannulate 100 mg IM once Follow-up with established outpatient psychiatry at [facility], [address].  Current medications: -Benztropine 0.5 mg twice daily/ EPS prophylaxis -Haloperidol 5 mg twice daily/ mood -Hydroxyzine 50 mg nightly PRN/ anxiety -Quetiapine 100 mg nightly/ mood  Disposition: No evidence of imminent risk  to self or others at present.   Patient does not meet criteria for psychiatric inpatient admission. Supportive therapy provided about ongoing stressors. Discussed crisis plan, support from social network, calling 911, coming to the Emergency Department, and calling Suicide Hotline.  This service was provided via telemedicine using a 2-way, interactive audio and video technology.  Names of all persons participating in this telemedicine service and their role in this encounter. Name: Doristine Mango Role: Patient  Name: John Giovanni telephone Role: Patient's father  Name: Doran Heater Role: FNP  Name: Dr. Bronwen Betters Role: Psychiatry    Lenard Lance, FNP 10/27/2020 11:09 AM

## 2020-10-27 NOTE — ED Notes (Signed)
Pt up and ambulatory to restroom and back to room

## 2020-10-27 NOTE — ED Notes (Signed)
Breakfast Orders Placed °

## 2020-10-30 ENCOUNTER — Encounter (HOSPITAL_COMMUNITY): Payer: Self-pay

## 2020-10-30 ENCOUNTER — Other Ambulatory Visit: Payer: Self-pay

## 2020-10-30 ENCOUNTER — Emergency Department (HOSPITAL_COMMUNITY)
Admission: EM | Admit: 2020-10-30 | Discharge: 2020-10-31 | Disposition: A | Discharge status: Home / Self Care | Payer: Self-pay | Attending: Emergency Medicine | Admitting: Emergency Medicine

## 2020-10-30 DIAGNOSIS — F17210 Nicotine dependence, cigarettes, uncomplicated: Secondary | ICD-10-CM | POA: Insufficient documentation

## 2020-10-30 DIAGNOSIS — G259 Extrapyramidal and movement disorder, unspecified: Secondary | ICD-10-CM

## 2020-10-30 DIAGNOSIS — M436 Torticollis: Secondary | ICD-10-CM | POA: Insufficient documentation

## 2020-10-30 MED ORDER — DIAZEPAM 5 MG PO TABS
5.0000 mg | ORAL_TABLET | Freq: Once | ORAL | Status: AC
  Administered 2020-10-30: 5 mg via ORAL
  Filled 2020-10-30: qty 1

## 2020-10-30 MED ORDER — BENZTROPINE MESYLATE 1 MG/ML IJ SOLN
2.0000 mg | Freq: Once | INTRAMUSCULAR | Status: AC
  Administered 2020-10-30: 2 mg via INTRAMUSCULAR
  Filled 2020-10-30: qty 2

## 2020-10-30 NOTE — ED Triage Notes (Signed)
Pt reports neck stiffness that began this evening. Pt walked in with his head tilted, but when asking questions he straightened his neck and then tilted his head back down.

## 2020-10-30 NOTE — ED Provider Notes (Signed)
[facility]-EMERGENCY DEPT Provider Note   CSN: [id number] Arrival date & time: 10/30/20  2232     History Chief Complaint  Patient presents with   Neck Pain    Brendan Hogan is a 25 y.o. male.  Patient presents to the emergency department for evaluation of severe stiffness of his neck.  Patient reports that over the course of today the left side of his neck has become stiff and he is now holding his head bent to the left, cannot straighten it out.  Patient denies any trauma.  No numbness, tingling or weakness of extremities.      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 Authorizing Provider  benztropine (COGENTIN) 0.5 MG tablet Take 1 tablet (0.5 mg total) by mouth 2 (two) times daily. Patient not taking: Reported on 10/25/2020 11/22/19   Thermon Leyland, NP  carbamazepine (TEGRETOL) 200 MG tablet Take 1 tablet (200 mg total) by mouth every morning. Patient not taking: No sig reported 11/22/19   Thermon Leyland, NP  carbamazepine (TEGRETOL) 200 MG tablet Take 1.5 tablets (300 mg total) by mouth  at bedtime. Patient not taking: No sig reported 11/22/19   Thermon Leyland, NP  haloperidol (HALDOL) 10 MG tablet Take 1 tablet (10 mg total) by mouth 2 (two) times daily. Patient not taking: No sig reported 11/22/19   Thermon Leyland, NP  hydrOXYzine (ATARAX/VISTARIL) 25 MG tablet Take 2 tablets (50 mg total) by mouth at bedtime. Patient not taking: Reported on 10/25/2020 07/19/20   Jackelyn Poling, DO  nicotine polacrilex (NICORETTE) 2 MG gum Take 1 each (2 mg total) by mouth as needed for smoking cessation. Patient not taking: No sig reported 10/25/19   Aldean Baker, NP  QUEtiapine (SEROQUEL) 100 MG tablet Take 1 tablet (100 mg total) by mouth at bedtime for 14 days. Patient not taking: Reported on 10/25/2020 05/29/20 06/12/20  Estella Husk, MD  risperidone (RISPERDAL M-TABS) 3 MG disintegrating tablet Take 1 tablet (3 mg total) by mouth at bedtime. Patient not taking: No sig reported 11/22/19   Thermon Leyland, NP  risperiDONE microspheres (RISPERDAL CONSTA) 25 MG injection Inject 2 mLs (25 mg total) into the muscle every 14 (fourteen) days. Patient not taking: No sig reported 11/07/19   Aldean Baker, NP  zolpidem (AMBIEN) 10 MG tablet Take 10 mg by mouth daily. Patient not taking: Reported on 10/25/2020 06/24/20  [provider]    Allergies    Patient has no known allergies.  Review of Systems   Review of Systems  Musculoskeletal:  Positive for neck pain.  All other systems reviewed and are negative.  Physical Exam Updated Vital Signs BP 132/80 (BP Location: Left Arm)   Pulse 88   Temp 97.8 F (36.6 C) (Oral)   Resp 16   Ht 5\' 7"  (1.702 m)   Wt 56.2 kg   SpO2 99%   BMI 19.42 kg/m   Physical Exam Vitals and nursing note reviewed.  Constitutional:      General: He is not in acute distress.    Appearance: Normal appearance. He is well-developed.  HENT:     Head: Normocephalic and atraumatic.     Right Ear: Hearing normal.     Left Ear: Hearing normal.     Nose:  Nose normal.  Eyes:     Conjunctiva/sclera: Conjunctivae normal.     Pupils: Pupils are equal, round, and reactive to light.  Cardiovascular:     Rate and Rhythm: Regular rhythm.     Heart sounds: S1 normal and S2 normal. No murmur heard.   No friction rub. No gallop.  Pulmonary:     Effort: Pulmonary effort is normal. No respiratory distress.     Breath sounds: Normal breath sounds.  Chest:     Chest wall: No tenderness.  Abdominal:     General: Bowel sounds are normal.     Palpations: Abdomen is soft.     Tenderness: There is no abdominal tenderness. There is no guarding or rebound. Negative signs include Murphy's sign and McBurney's sign.     Hernia: No hernia is present.  Musculoskeletal:     Cervical back: Neck supple. Torticollis present. Pain with movement and muscular tenderness present. No spinous process tenderness. Decreased range of motion.  Skin:    General: Skin is warm and dry.     Findings: No rash.  Neurological:     Mental Status: He is alert and oriented to person, place, and time.     GCS: GCS eye subscore is 4. GCS verbal subscore is 5. GCS motor subscore is 6.     Cranial Nerves: No cranial nerve deficit.     Sensory: No sensory deficit.     Coordination: Coordination normal.     Comments: Normal strength and sensation bilateral upper extremities  Psychiatric:        Speech: Speech normal.        Behavior: Behavior normal.        Thought Content: Thought content normal.    ED Results / Procedures / Treatments   Labs (all labs ordered are listed, but only abnormal results are displayed) Labs Reviewed - No data to display  EKG None  Radiology No results found.  Procedures Procedures   Medications Ordered in ED Medications  benztropine mesylate (COGENTIN) injection 2 mg (has no administration in time range)  diazepam (VALIUM) tablet 5 mg (has no administration in time range)    ED Course  I have reviewed the triage vital signs and the  nursing notes.  Pertinent labs & imaging results that were available during my care of the patient were reviewed by me and considered in my medical decision making (see chart for details).    MDM Rules/Calculators/A&P                          Patient presents  with apparent torticollis.  Patient has not had any injury.  He has a normal neurologic exam.  Patient with severe pain on the posterior aspect of the neck, predominantly on the left, holding the head tilted to the left.  Patient did, however, receive Haldol injections several days ago and this could be contributory.  Patient treated with Valium and Cogentin.  Patient has had complete resolution.  Final Clinical Impression(s) / ED Diagnoses Final diagnoses:  None    Rx / DC Orders ED Discharge Orders     None        Magdelyn Roebuck, Canary Brim, MD 11/01/20 [phone]

## 2020-10-31 MED ORDER — BENZTROPINE MESYLATE 2 MG PO TABS: 2.0000 mg | ORAL_TABLET | Freq: Two times a day (BID) | ORAL | 0 refills | Status: DC

## 2020-10-31 NOTE — Discharge Instructions (Addendum)
You developed a stiff neck because of the Haldol shots that were given to you the other day.  The prescription I have given you for Cogentin will help it from coming back.  This is a medicine you were on in the past as well.

## 2020-10-31 NOTE — ED Notes (Signed)
Pt given water and turkey sandwich °

## 2020-11-19 ENCOUNTER — Encounter (HOSPITAL_COMMUNITY): Payer: Self-pay

## 2020-11-19 ENCOUNTER — Emergency Department (HOSPITAL_COMMUNITY)
Admission: EM | Admit: 2020-11-19 | Discharge: 2020-11-19 | Disposition: A | Discharge status: Home / Self Care | Payer: No Typology Code available for payment source | Attending: Emergency Medicine | Admitting: Emergency Medicine

## 2020-11-19 ENCOUNTER — Emergency Department (HOSPITAL_COMMUNITY): Payer: No Typology Code available for payment source

## 2020-11-19 ENCOUNTER — Other Ambulatory Visit: Payer: Self-pay

## 2020-11-19 DIAGNOSIS — F17210 Nicotine dependence, cigarettes, uncomplicated: Secondary | ICD-10-CM | POA: Diagnosis not present

## 2020-11-19 DIAGNOSIS — Z23 Encounter for immunization: Secondary | ICD-10-CM | POA: Diagnosis not present

## 2020-11-19 DIAGNOSIS — S0031XA Abrasion of nose, initial encounter: Secondary | ICD-10-CM | POA: Diagnosis not present

## 2020-11-19 DIAGNOSIS — R791 Abnormal coagulation profile: Secondary | ICD-10-CM | POA: Diagnosis not present

## 2020-11-19 DIAGNOSIS — Y92410 Unspecified street and highway as the place of occurrence of the external cause: Secondary | ICD-10-CM | POA: Insufficient documentation

## 2020-11-19 DIAGNOSIS — S61211A Laceration without foreign body of left index finger without damage to nail, initial encounter: Secondary | ICD-10-CM | POA: Diagnosis not present

## 2020-11-19 DIAGNOSIS — Z79899 Other long term (current) drug therapy: Secondary | ICD-10-CM | POA: Diagnosis not present

## 2020-11-19 DIAGNOSIS — Y900 Blood alcohol level of less than 20 mg/100 ml: Secondary | ICD-10-CM | POA: Diagnosis not present

## 2020-11-19 DIAGNOSIS — M25551 Pain in right hip: Secondary | ICD-10-CM | POA: Insufficient documentation

## 2020-11-19 DIAGNOSIS — S8991XA Unspecified injury of right lower leg, initial encounter: Secondary | ICD-10-CM | POA: Diagnosis present

## 2020-11-19 DIAGNOSIS — S81811A Laceration without foreign body, right lower leg, initial encounter: Secondary | ICD-10-CM

## 2020-11-19 DIAGNOSIS — S61422A Laceration with foreign body of left hand, initial encounter: Secondary | ICD-10-CM

## 2020-11-19 DIAGNOSIS — S61412A Laceration without foreign body of left hand, initial encounter: Secondary | ICD-10-CM | POA: Insufficient documentation

## 2020-11-19 LAB — COMPREHENSIVE METABOLIC PANEL
ALT: 12 U/L (ref 0–44)
AST: 21 U/L (ref 15–41)
Albumin: 4.6 g/dL (ref 3.5–5.0)
Alkaline Phosphatase: 93 U/L (ref 38–126)
Anion gap: 9 (ref 5–15)
BUN: 9 mg/dL (ref 6–20)
CO2: 28 mmol/L (ref 22–32)
Calcium: 9.4 mg/dL (ref 8.9–10.3)
Chloride: 102 mmol/L (ref 98–111)
Creatinine, Ser: 0.76 mg/dL (ref 0.61–1.24)
GFR, Estimated: >60 mL/min (ref >60)
Glucose, Bld: 110 mg/dL — ABNORMAL HIGH (ref 70–99)
Potassium: 3.8 mmol/L (ref 3.5–5.1)
Sodium: 139 mmol/L (ref 135–145)
Total Bilirubin: 0.9 mg/dL (ref 0.3–1.2)
Total Protein: 7.1 g/dL (ref 6.5–8.1)

## 2020-11-19 LAB — I-STAT CHEM 8, ED
BUN: 6 mg/dL (ref 6–20)
Calcium, Ion: 1.18 mmol/L (ref 1.15–1.40)
Chloride: 100 mmol/L (ref 98–111)
Creatinine, Ser: 0.8 mg/dL (ref 0.61–1.24)
Glucose, Bld: 109 mg/dL — ABNORMAL HIGH (ref 70–99)
HCT: 47 % (ref 39.0–52.0)
Hemoglobin: 16 g/dL (ref 13.0–17.0)
Potassium: 3.8 mmol/L (ref 3.5–5.1)
Sodium: 139 mmol/L (ref 135–145)
TCO2: 27 mmol/L (ref 22–32)

## 2020-11-19 LAB — URINALYSIS, ROUTINE W REFLEX MICROSCOPIC
Bilirubin Urine: NEGATIVE
Glucose, UA: NEGATIVE mg/dL
Ketones, ur: NEGATIVE mg/dL
Leukocytes,Ua: NEGATIVE
Nitrite: NEGATIVE
Protein, ur: NEGATIVE mg/dL
Specific Gravity, Urine: 1.016 (ref 1.005–1.030)
pH: 7 (ref 5.0–8.0)

## 2020-11-19 LAB — CBC
HCT: 46.2 % (ref 39.0–52.0)
Hemoglobin: 15.6 g/dL (ref 13.0–17.0)
MCH: 29.3 pg (ref 26.0–34.0)
MCHC: 33.8 g/dL (ref 30.0–36.0)
MCV: 86.8 fL (ref 80.0–100.0)
Platelets: 206 10*3/uL (ref 150–400)
RBC: 5.32 MIL/uL (ref 4.22–5.81)
RDW: 12.5 % (ref 11.5–15.5)
WBC: 10.4 10*3/uL (ref 4.0–10.5)
nRBC: 0 % (ref 0.0–0.2)

## 2020-11-19 LAB — ETHANOL: Alcohol, Ethyl (B): <10 mg/dL (ref <10)

## 2020-11-19 LAB — SAMPLE TO BLOOD BANK

## 2020-11-19 LAB — LACTIC ACID, PLASMA: Lactic Acid, Venous: 1.7 mmol/L (ref 0.5–1.9)

## 2020-11-19 LAB — PROTIME-INR
INR: 1.1 (ref 0.8–1.2)
Prothrombin Time: 14.1 seconds (ref 11.4–15.2)

## 2020-11-19 MED ORDER — TETANUS-DIPHTH-ACELL PERTUSSIS 5-2.5-18.5 LF-MCG/0.5 IM SUSY
0.5000 mL | PREFILLED_SYRINGE | Freq: Once | INTRAMUSCULAR | Status: AC
  Administered 2020-11-19: 0.5 mL via INTRAMUSCULAR
  Filled 2020-11-19: qty 0.5

## 2020-11-19 MED ORDER — CEPHALEXIN 500 MG PO CAPS: 500.0000 mg | ORAL_CAPSULE | Freq: Four times a day (QID) | ORAL | 0 refills | Status: AC

## 2020-11-19 MED ORDER — LIDOCAINE-EPINEPHRINE 2 %-1:100000 IJ SOLN: 20.0000 mL | Freq: Once | INTRAMUSCULAR | Status: DC

## 2020-11-19 MED ORDER — CEFAZOLIN SODIUM-DEXTROSE 2-4 GM/100ML-% IV SOLN
2.0000 g | INTRAVENOUS | Status: DC
  Filled 2020-11-19: qty 100

## 2020-11-19 MED ORDER — SODIUM CHLORIDE 0.9 % IV SOLN
2.0000 g | INTRAVENOUS | Status: DC
  Filled 2020-11-19: qty 2

## 2020-11-19 MED ORDER — FENTANYL CITRATE (PF) 100 MCG/2ML IJ SOLN
50.0000 ug | Freq: Once | INTRAMUSCULAR | Status: DC
  Filled 2020-11-19: qty 2

## 2020-11-19 MED ORDER — LIDOCAINE-EPINEPHRINE (PF) 2 %-1:200000 IJ SOLN
10.0000 mL | Freq: Once | INTRAMUSCULAR | Status: AC
  Administered 2020-11-19: 10 mL
  Filled 2020-11-19: qty 20
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
## 2020-11-19 NOTE — Discharge Instructions (Addendum)
Your sutures will need to be removed in 7-10 days. Continue to irrigate your hand lacerations, which will heal by secondary intention. Your wounds had small bits of glass in them and the wounds were copiously irrigated.

## 2020-11-27 ENCOUNTER — Ambulatory Visit (HOSPITAL_COMMUNITY)
Admission: EM | Admit: 2020-11-27 | Discharge: 2020-11-27 | Disposition: A | Discharge status: Home / Self Care | Payer: Self-pay | Attending: Internal Medicine | Admitting: Internal Medicine

## 2020-11-27 ENCOUNTER — Other Ambulatory Visit: Payer: Self-pay

## 2020-11-27 DIAGNOSIS — Z4802 Encounter for removal of sutures: Secondary | ICD-10-CM

## 2020-11-27 NOTE — ED Triage Notes (Signed)
Pt presents for suture removal. Denies any concerns at this time.  

## 2021-03-23 ENCOUNTER — Encounter (HOSPITAL_COMMUNITY): Payer: Self-pay | Admitting: *Deleted

## 2021-03-23 ENCOUNTER — Emergency Department (HOSPITAL_COMMUNITY)
Admission: EM | Admit: 2021-03-23 | Discharge: 2021-03-24 | Disposition: A | Discharge status: Other Acute Inpatient Hospital | Payer: 59 | Attending: Emergency Medicine | Admitting: Emergency Medicine

## 2021-03-23 ENCOUNTER — Other Ambulatory Visit: Payer: Self-pay

## 2021-03-23 DIAGNOSIS — F22 Delusional disorders: Secondary | ICD-10-CM | POA: Diagnosis not present

## 2021-03-23 DIAGNOSIS — Z20822 Contact with and (suspected) exposure to covid-19: Secondary | ICD-10-CM | POA: Insufficient documentation

## 2021-03-23 DIAGNOSIS — F29 Unspecified psychosis not due to a substance or known physiological condition: Secondary | ICD-10-CM

## 2021-03-23 DIAGNOSIS — F209 Schizophrenia, unspecified: Secondary | ICD-10-CM | POA: Diagnosis not present

## 2021-03-23 DIAGNOSIS — F17210 Nicotine dependence, cigarettes, uncomplicated: Secondary | ICD-10-CM | POA: Insufficient documentation

## 2021-03-23 DIAGNOSIS — Z046 Encounter for general psychiatric examination, requested by authority: Secondary | ICD-10-CM | POA: Diagnosis present

## 2021-03-23 LAB — CBC WITH DIFFERENTIAL/PLATELET
Abs Immature Granulocytes: 0.02 10*3/uL (ref 0.00–0.07)
Basophils Absolute: 0 10*3/uL (ref 0.0–0.1)
Basophils Relative: 0 %
Eosinophils Absolute: 0.1 10*3/uL (ref 0.0–0.5)
Eosinophils Relative: 1 %
HCT: 45.4 % (ref 39.0–52.0)
Hemoglobin: 15.3 g/dL (ref 13.0–17.0)
Immature Granulocytes: 0 %
Lymphocytes Relative: 14 %
Lymphs Abs: 0.9 10*3/uL (ref 0.7–4.0)
MCH: 28.5 pg (ref 26.0–34.0)
MCHC: 33.7 g/dL (ref 30.0–36.0)
MCV: 84.7 fL (ref 80.0–100.0)
Monocytes Absolute: 0.5 10*3/uL (ref 0.1–1.0)
Monocytes Relative: 8 %
Neutro Abs: 4.7 10*3/uL (ref 1.7–7.7)
Neutrophils Relative %: 77 %
Platelets: 191 10*3/uL (ref 150–400)
RBC: 5.36 MIL/uL (ref 4.22–5.81)
RDW: 11.8 % (ref 11.5–15.5)
WBC: 6.2 10*3/uL (ref 4.0–10.5)
nRBC: 0 % (ref 0.0–0.2)

## 2021-03-23 LAB — COMPREHENSIVE METABOLIC PANEL
ALT: 295 U/L — ABNORMAL HIGH (ref 0–44)
AST: 1433 U/L — ABNORMAL HIGH (ref 15–41)
Albumin: 4 g/dL (ref 3.5–5.0)
Alkaline Phosphatase: 73 U/L (ref 38–126)
Anion gap: 7 (ref 5–15)
BUN: 10 mg/dL (ref 6–20)
CO2: 26 mmol/L (ref 22–32)
Calcium: 9.1 mg/dL (ref 8.9–10.3)
Chloride: 106 mmol/L (ref 98–111)
Creatinine, Ser: 0.91 mg/dL (ref 0.61–1.24)
GFR, Estimated: >60 mL/min (ref >60)
Glucose, Bld: 110 mg/dL — ABNORMAL HIGH (ref 70–99)
Potassium: 4 mmol/L (ref 3.5–5.1)
Sodium: 139 mmol/L (ref 135–145)
Total Bilirubin: 1.1 mg/dL (ref 0.3–1.2)
Total Protein: 6.6 g/dL (ref 6.5–8.1)

## 2021-03-23 LAB — RESP PANEL BY RT-PCR (FLU A&B, COVID) ARPGX2
Influenza A by PCR: NEGATIVE
Influenza B by PCR: NEGATIVE
SARS Coronavirus 2 by RT PCR: NEGATIVE

## 2021-03-23 LAB — ETHANOL: Alcohol, Ethyl (B): <10 mg/dL (ref <10)

## 2021-03-23 MED ORDER — LORAZEPAM 1 MG PO TABS: 1.0000 mg | ORAL_TABLET | ORAL | Status: DC | PRN

## 2021-03-23 MED ORDER — ZIPRASIDONE MESYLATE 20 MG IM SOLR: 20.0000 mg | INTRAMUSCULAR | Status: DC | PRN

## 2021-03-23 MED ORDER — RISPERIDONE 1 MG PO TBDP
3.0000 mg | ORAL_TABLET | Freq: Every day | ORAL | Status: DC
  Administered 2021-03-23: 3 mg via ORAL
  Filled 2021-03-23 (×2): qty 3

## 2021-03-23 MED ORDER — BENZTROPINE MESYLATE 1 MG PO TABS
2.0000 mg | ORAL_TABLET | Freq: Two times a day (BID) | ORAL | Status: DC
  Administered 2021-03-23 (×2): 2 mg via ORAL
  Filled 2021-03-23 (×2): qty 1
  Filled 2021-03-23: qty 2

## 2021-03-23 MED ORDER — ZOLPIDEM TARTRATE 5 MG PO TABS: 5.0000 mg | ORAL_TABLET | Freq: Every evening | ORAL | Status: DC | PRN

## 2021-03-23 MED ORDER — ACETAMINOPHEN 325 MG PO TABS: 650.0000 mg | ORAL_TABLET | ORAL | Status: DC | PRN

## 2021-03-23 MED ORDER — CARBAMAZEPINE 200 MG PO TABS
200.0000 mg | ORAL_TABLET | ORAL | Status: DC
  Administered 2021-03-23: 200 mg via ORAL
  Filled 2021-03-23 (×2): qty 1

## 2021-03-23 MED ORDER — NICOTINE POLACRILEX 2 MG MT GUM
2.0000 mg | CHEWING_GUM | OROMUCOSAL | Status: DC | PRN
  Filled 2021-03-23: qty 1

## 2021-03-23 MED ORDER — ARIPIPRAZOLE 10 MG PO TABS
20.0000 mg | ORAL_TABLET | Freq: Every day | ORAL | Status: DC
  Administered 2021-03-23: 20 mg via ORAL
  Filled 2021-03-23: qty 2

## 2021-03-23 MED ORDER — RISPERIDONE MICROSPHERES ER 25 MG IM SRER
25.0000 mg | INTRAMUSCULAR | Status: DC
  Filled 2021-03-23: qty 2

## 2021-03-23 MED ORDER — RISPERIDONE 1 MG PO TBDP
2.0000 mg | ORAL_TABLET | Freq: Three times a day (TID) | ORAL | Status: DC | PRN
  Filled 2021-03-23: qty 2

## 2021-03-23 MED ORDER — ONDANSETRON HCL 4 MG PO TABS: 4.0000 mg | ORAL_TABLET | Freq: Three times a day (TID) | ORAL | Status: DC | PRN

## 2021-03-23 MED ORDER — CARBAMAZEPINE 200 MG PO TABS
300.0000 mg | ORAL_TABLET | Freq: Every day | ORAL | Status: DC
  Administered 2021-03-23: 300 mg via ORAL
  Filled 2021-03-23: qty 1.5

## 2021-03-23 MED ORDER — HALOPERIDOL 5 MG PO TABS
10.0000 mg | ORAL_TABLET | Freq: Two times a day (BID) | ORAL | Status: DC
  Administered 2021-03-23 (×2): 10 mg via ORAL
  Filled 2021-03-23 (×2): qty 2

## 2021-03-23 MED ORDER — HYDROXYZINE HCL 50 MG PO TABS
50.0000 mg | ORAL_TABLET | Freq: Every day | ORAL | Status: DC
  Administered 2021-03-23: 50 mg via ORAL
  Filled 2021-03-23: qty 1

## 2021-03-23 MED ORDER — BENZTROPINE MESYLATE 0.5 MG PO TABS: 0.5000 mg | ORAL_TABLET | Freq: Two times a day (BID) | ORAL | Status: DC

## 2021-03-23 MED ORDER — ZOLPIDEM TARTRATE 5 MG PO TABS: 10.0000 mg | ORAL_TABLET | Freq: Every day | ORAL | Status: DC

## 2021-03-23 MED ORDER — QUETIAPINE FUMARATE 100 MG PO TABS
100.0000 mg | ORAL_TABLET | Freq: Every day | ORAL | Status: DC
  Administered 2021-03-23: 100 mg via ORAL
  Filled 2021-03-23 (×2): qty 1

## 2021-03-23 NOTE — Progress Notes (Signed)
CSW called to update father/legal guardian that patient has been referred out.   CSW informed that patient has been accepted by [facility].    Father questioned the visitation policy for both [name] and [facility] and CSW referred father contact both facilities respectively.    CSW followed up with [facility] and provided correct contact information for legal guardian/father.    Sanjit Mcmichael, [phone]  Penni Homans, MSW, LCSW 03/23/2021 3:00 PM

## 2021-03-23 NOTE — ED Notes (Signed)
Patient refused bloodwork 

## 2021-03-23 NOTE — ED Notes (Signed)
Pt refused EKG, this tech explained to pt the importance of EKG.

## 2021-03-23 NOTE — ED Notes (Signed)
Patient has lunch at bedside, but patient stated he doesn't want anything to eat.

## 2021-03-23 NOTE — ED Notes (Addendum)
Per social work, pt will not be bale to be transported to [facility] until after 10AM tomorrow morning.

## 2021-03-23 NOTE — Progress Notes (Addendum)
ADDENDUM Sheriffs office can not provide transport.  [facility] will hold bed for 24 hours.  Patient to arrive after 10AM on Saturday 03/24/2021. Penni Homans, MSW, LCSW 03/23/2021 3:30 PM   ADDENDUM CSW corrected number for report. CSW has left RN know of number change.  Penni Homans, MSW, LCSW 03/23/2021 1:44 PM    Patient was accepted to [facility].  Meets inpatient criteria per Liborio Nixon, NP.  Attending physician is Dr. Traci Sermon.  Patient will go to the [facility].  Notified Assunta Curtis, RN of acceptance.  Nurses call report to [phone].  Patient can arrive at 03/23/2021 after 1:00PM  Penni Homans, MSW, LCSW 03/23/2021 12:47 PM

## 2021-03-23 NOTE — ED Notes (Signed)
Called the sheriff tx and left a message
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
## 2021-03-23 NOTE — ED Notes (Signed)
Patient to bed 48, sitter at bedside

## 2021-03-23 NOTE — Progress Notes (Addendum)
ADDENDUM CSW spoke with the patient's father/legal guardian and made him aware of the change in plans.  Father is aware and ok with the delay.  Penni Homans, MSW, LCSW 03/23/2021 3:27 PM    CSW attempted to call father and update to change of plans with patient's placement.    CSW left HIPAA compliant voicemail.  Penni Homans, MSW, LCSW 03/23/2021 3:21 PM
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
## 2021-03-23 NOTE — ED Provider Notes (Signed)
[facility] EMERGENCY DEPARTMENT Provider Note   CSN: [id number] Arrival date & time: 03/23/21  0601     History Chief Complaint  Patient presents with   Hallucinations    Brendan Hogan is a 25 y.o. male.   Mental Health Problem Presenting symptoms: hallucinations and paranoid behavior   Degree of incapacity (severity):  Mild Onset quality:  Gradual Timing:  Constant Progression:  Worsening Chronicity:  New Context: not alcohol use   Treatment compliance:  Untreated Relieved by:  None tried Worsened by:  Nothing Ineffective treatments:  None tried Associated symptoms: no abdominal pain       Past Medical History:  Diagnosis Date   Psychosis ([place]) 05/2019   [place]    Seasonal allergies     Patient Active Problem List   Diagnosis Date Noted   Schizoaffective disorder ([place]) 11/16/2019   Primary insomnia 11/04/2019   Schizophrenia ([place]) 10/19/2019   Psychosis ([place]) 10/19/2019   Other bipolar disorder ([place])    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 Authorizing Provider  ARIPiprazole (ABILIFY) 20 MG tablet Take 20 mg by mouth at bedtime. 03/20/21  Yes [provider]  zolpidem (AMBIEN) 10 MG tablet Take 10 mg by mouth daily. 06/24/20  Yes [provider]  benztropine (COGENTIN) 0.5 MG tablet Take 1 tablet (0.5 mg total) by mouth 2 (two) times daily. Patient not taking: Reported on 10/25/2020 11/22/19    Niel Hummer, NP  benztropine (COGENTIN) 2 MG tablet Take 1 tablet (2 mg total) by mouth 2 (two) times daily. Patient not taking: Reported on 03/23/2021 10/31/20   Orpah Greek, MD  carbamazepine (TEGRETOL) 200 MG tablet Take 1 tablet (200 mg total) by mouth every morning. Patient not taking: Reported on 07/19/2020 11/22/19   Niel Hummer, NP  carbamazepine (TEGRETOL) 200 MG tablet Take 1.5 tablets (300 mg total) by mouth at bedtime. Patient not taking: Reported on 07/19/2020 11/22/19   Niel Hummer, NP  haloperidol (HALDOL) 10 MG tablet Take 1 tablet (10 mg total) by mouth 2 (two) times daily. Patient not taking: Reported on 07/19/2020 11/22/19   Niel Hummer, NP  hydrOXYzine (ATARAX/VISTARIL) 25 MG tablet Take 2 tablets (50 mg total) by mouth at bedtime. Patient not taking: Reported on 10/25/2020 07/19/20   Lurline Del, DO  nicotine polacrilex (NICORETTE) 2 MG gum Take 1 each (2 mg total) by mouth as needed for smoking cessation. Patient not taking: Reported on 11/15/2019 10/25/19   Connye Burkitt, NP  QUEtiapine (SEROQUEL) 100 MG tablet Take 1 tablet (100 mg total) by mouth at bedtime for 14 days. Patient not taking: Reported on 03/23/2021 05/29/20 06/12/20  Ival Bible, MD  risperidone (RISPERDAL M-TABS) 3 MG disintegrating tablet Take 1 tablet (3 mg total) by mouth at bedtime. Patient not taking: Reported on  07/19/2020 11/22/19   Thermon Leyland, NP  risperiDONE microspheres (RISPERDAL CONSTA) 25 MG injection Inject 2 mLs (25 mg total) into the muscle every 14 (fourteen) days. Patient not taking: Reported on 11/15/2019 11/07/19   Aldean Baker, NP    Allergies    Haldol [haloperidol]  Review of Systems   Review of Systems  Gastrointestinal:  Negative for abdominal pain.  Psychiatric/Behavioral:  Positive for hallucinations and paranoia.   All other systems reviewed and are negative.  Physical Exam Updated Vital Signs BP 122/81   Pulse (!) 101   Temp (!) 97.4 F (36.3  C) (Oral)   Resp 16   Ht 5\' 8"  (1.727 m)   Wt 63.5 kg   SpO2 100%   BMI 21.29 kg/m   Physical Exam Vitals and nursing note reviewed.  Constitutional:      Appearance: He is well-developed.  HENT:     Head: Normocephalic and atraumatic.     Mouth/Throat:     Mouth: Mucous membranes are moist.     Pharynx: Oropharynx is clear.  Eyes:     Pupils: Pupils are equal, round, and reactive to light.  Cardiovascular:     Rate and Rhythm: Normal rate.  Pulmonary:     Effort: Pulmonary effort is normal. No respiratory distress.  Abdominal:     General: There is no distension.  Musculoskeletal:        General: Normal range of motion.     Cervical back: Normal range of motion.  Skin:    General: Skin is warm and dry.     Coloration: Skin is not jaundiced or pale.  Neurological:     General: No focal deficit present.     Mental Status: He is alert.    ED Results / Procedures / Treatments   Labs (all labs ordered are listed, but only abnormal results are displayed) Labs Reviewed - No data to display  EKG None  Radiology No results found.  Procedures Procedures   Medications Ordered in ED Medications - No data to display  ED Course  I have reviewed the triage vital signs and the nursing notes.  Pertinent labs & imaging results that were available during my care of the patient were reviewed by me and considered in my medical decision making (see chart for details).    MDM Rules/Calculators/A&P                         Per triage note: increased hallucinations and decreased need for sleep recently h/o schizophrenia. IVC placed. 24 hour exam done.   Final Clinical Impression(s) / ED Diagnoses Final diagnoses:  None    Rx / DC Orders ED Discharge Orders     None        Maela Takeda, , MD 03/23/21 [phone]

## 2021-03-23 NOTE — ED Notes (Signed)
Pt changed into purple scrubs, belongings inventoried and pt wanded. One bag of belongings. Not placed in locker, due to pt placement at [facility] prior to locker placement.
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
## 2021-03-23 NOTE — ED Notes (Signed)
Kylon Philbrook father [phone] requesting an update

## 2021-03-23 NOTE — ED Notes (Signed)
Pt agreeable to letting staff obtain labs and EKG at this time.

## 2021-03-23 NOTE — ED Notes (Signed)
Pt is allowed to have parents as visitors 1 at a time d/t they are his legal guardians.

## 2021-03-23 NOTE — ED Notes (Signed)
Per TTS counselor request, pt placed in room with TTS machine. Notified TTS counselor of pt readiness.

## 2021-03-23 NOTE — Progress Notes (Signed)
CSW called [facility] to inform of the barrier with transportation from [employer].  CSW spoke with [place].  She reports that [facility] will hold the patient's bed for 24 hours and pt can arrive 03/24/2021 after 10AM.  CSW has updated the patient's RN, Clara.  Penni Homans, MSW, LCSW 03/23/2021 3:18 PM

## 2021-03-23 NOTE — Progress Notes (Signed)
Pt under review at [facility].  Disposition pending.  Penni Homans, MSW, LCSW 03/23/2021 11:39 AM

## 2021-03-23 NOTE — ED Notes (Signed)
IVC paperwork completed, copies handed to nurse

## 2021-03-23 NOTE — Progress Notes (Signed)
Patient meets inpatient criteria per Liborio Nixon, NP.  Per [facility], no [facility] beds available.  CSW will fax out.  Patient was referred to the following facilities:  Service Provider Address Phone Fax  [facility]  [address] [place] [address] [phone]  [facility]  [address] [place] [address] [phone]  [facility]  [address], [place] [address] [phone]  [facility]  [address] [place] [address] [phone]  [facility]  [address] [place] [address] [phone]  [facility]  [address] [place], [place] [address] [phone]  [facility]  [address] [place], [place] [address] [phone]  [facility]  [address] [place] [address] [phone]  [facility]  [address] [place] [address] [phone]  [facility]  [address] [place] [address] [phone]     CSW will continue to monitor for disposition.  Penni Homans, MSW, LCSW 03/23/2021 12:16 PM

## 2021-03-23 NOTE — ED Notes (Signed)
Staff at [facility] reached out for information to possibly admit this pt.

## 2021-03-23 NOTE — ED Notes (Signed)
[facility] for fathers phone number, [phone]
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
## 2021-03-23 NOTE — ED Notes (Signed)
Pt refusing both EKG and labs. Notified MD.

## 2021-03-24 NOTE — ED Notes (Signed)
[facility] here for transport for the patient.

## 2021-03-24 NOTE — ED Notes (Signed)
Pt leaving with [facility] for transport to [facility].

## 2021-03-24 NOTE — ED Notes (Signed)
Report given to Duffy Rhody, RN at [facility].

## 2021-03-24 NOTE — ED Notes (Signed)
PT sleep off and on at beginning of shift . PT woke up and ask that the chair be removed from his room , The chair was making him uncomfortable. PT ask if he could eat the food that was on his counter in his room at 5:30 am  I asked PT if he would mind waiting until breakfast time he stated that he would eat it anyway. PT went back to sleep.

## 2021-04-20 ENCOUNTER — Emergency Department (HOSPITAL_COMMUNITY)
Admission: EM | Admit: 2021-04-20 | Discharge: 2021-04-20 | Disposition: A | Discharge status: Home / Self Care | Payer: 59 | Attending: Emergency Medicine | Admitting: Emergency Medicine

## 2021-04-20 ENCOUNTER — Encounter (HOSPITAL_COMMUNITY): Payer: Self-pay

## 2021-04-20 DIAGNOSIS — R000 Tachycardia, unspecified: Secondary | ICD-10-CM | POA: Insufficient documentation

## 2021-04-20 DIAGNOSIS — U071 COVID-19: Secondary | ICD-10-CM | POA: Insufficient documentation

## 2021-04-20 DIAGNOSIS — F17210 Nicotine dependence, cigarettes, uncomplicated: Secondary | ICD-10-CM | POA: Insufficient documentation

## 2021-04-20 DIAGNOSIS — R059 Cough, unspecified: Secondary | ICD-10-CM | POA: Diagnosis present

## 2021-04-20 MED ORDER — BENZONATATE 100 MG PO CAPS: 100.0000 mg | ORAL_CAPSULE | Freq: Three times a day (TID) | ORAL | 0 refills | Status: DC

## 2021-04-20 NOTE — Discharge Instructions (Addendum)
Suspect that your symptoms are due to COVID-19 considering positive COVID test in your household.  This is a viral infection, therefore antibiotics are not indicated.  Additionally you are not a candidate for Paxlovid antiviral medication according to the FDA.  Vitals and work-up are reassuring.  Managed with supportive care including Tylenol/ibuprofen for fevers and Tessalon which I have prescribed to use it as a cough suppressant.  Additionally, the CDC guidelines recommend 5 days of quarantine following a positive COVID test with 5 more days of mask wearing.  Please follow these guidelines.  Return if development of any new or worsening symptoms.

## 2021-04-20 NOTE — ED Triage Notes (Signed)
Pt reports cough and fever since last night. Pt tested positive at home for covid. States he is here to potentially get Paxlovid.

## 2021-04-20 NOTE — ED Provider Notes (Signed)
[facility]-EMERGENCY DEPT Provider Note   CSN: [id number] Arrival date & time: 04/20/21  1147     History Chief Complaint  Patient presents with   Covid Positive    Brendan Hogan is a 25 y.o. male.  No pertinent past medical history presents today with chief complaint of cough and fever.  He states that his symptoms began yesterday evening. He took a home COVID test which was positive.  Cough is non-productive. His mother recently tested positive for COVID and received treatment with Paxlovid, he presents today requesting same. He is not diabetic or asthmatic. No meds PTA  The history is provided by the patient. No language interpreter was used.      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 Authorizing Provider  ARIPiprazole (ABILIFY) 20 MG tablet Take 20 mg by mouth at bedtime. 03/20/21   [provider]  benztropine (COGENTIN) 0.5 MG tablet Take 1 tablet (0.5 mg total) by mouth 2 (two) times daily. Patient not taking: Reported on 10/25/2020 11/22/19   Thermon Leyland, NP  benztropine (COGENTIN) 2 MG  tablet Take 1 tablet (2 mg total) by mouth 2 (two) times daily. Patient not taking: Reported on 03/23/2021 10/31/20   Gilda Crease, MD  carbamazepine (TEGRETOL) 200 MG tablet Take 1 tablet (200 mg total) by mouth every morning. Patient not taking: Reported on 07/19/2020 11/22/19   Thermon Leyland, NP  carbamazepine (TEGRETOL) 200 MG tablet Take 1.5 tablets (300 mg total) by mouth at bedtime. Patient not taking: Reported on 07/19/2020 11/22/19   Thermon Leyland, NP  haloperidol (HALDOL) 10 MG tablet Take 1 tablet (10 mg total) by mouth 2 (two) times daily. Patient not taking: Reported on 07/19/2020 11/22/19   Thermon Leyland, NP  hydrOXYzine (ATARAX/VISTARIL) 25 MG tablet Take 2 tablets (50 mg total) by mouth at bedtime. Patient not taking: Reported on 10/25/2020 07/19/20   Jackelyn Poling, DO  nicotine polacrilex (NICORETTE) 2 MG gum Take 1 each (2 mg total) by mouth as needed for smoking cessation. Patient not taking: Reported on 11/15/2019 10/25/19   Aldean Baker, NP  QUEtiapine (SEROQUEL) 100 MG tablet Take 1 tablet (100 mg total) by mouth at bedtime for 14 days. Patient not taking: Reported on 03/23/2021 05/29/20 06/12/20  Estella Husk, MD  risperidone (RISPERDAL M-TABS) 3 MG disintegrating tablet Take 1 tablet (3 mg total) by mouth at bedtime. Patient not taking: Reported on 07/19/2020 11/22/19  Thermon Leyland, NP  risperiDONE microspheres (RISPERDAL CONSTA) 25 MG injection Inject 2 mLs (25 mg total) into the muscle every 14 (fourteen) days. Patient not taking: Reported on 11/15/2019 11/07/19   Aldean Baker, NP  zolpidem (AMBIEN) 10 MG tablet Take 10 mg by mouth daily. 06/24/20   [provider]    Allergies    Haldol [haloperidol]  Review of Systems   Review of Systems  Constitutional:  Positive for chills and fever. Negative for activity change, appetite change, diaphoresis, fatigue and unexpected weight change.  HENT:  Positive for congestion, postnasal drip and rhinorrhea.  Negative for dental problem, drooling, ear discharge, ear pain, facial swelling, mouth sores, nosebleeds, sinus pressure, sinus pain, sneezing, sore throat, trouble swallowing and voice change.   Eyes:  Negative for redness.  Respiratory:  Positive for cough. Negative for shortness of breath.   Cardiovascular:  Negative for chest pain and palpitations.  Gastrointestinal:  Negative for abdominal pain, diarrhea, nausea and vomiting.  Musculoskeletal:  Negative for neck pain and neck stiffness.  Skin:  Negative for rash.  Allergic/Immunologic: Negative for immunocompromised state.  Neurological:  Negative for dizziness, tremors, seizures, syncope, facial asymmetry, speech difficulty, weakness, light-headedness, numbness and headaches.  Psychiatric/Behavioral:  Negative for confusion and decreased concentration.   All other systems reviewed and are negative.  Physical Exam Updated Vital Signs BP 112/72 (BP Location: Right Arm)    Pulse (!) 106    Temp (!) 100.7 F (38.2 C) (Oral)    Resp 18    SpO2 100%   Physical Exam Vitals and nursing note reviewed.  Constitutional:      General: He is not in acute distress.    Appearance: Normal appearance. He is normal weight. He is not ill-appearing, toxic-appearing or diaphoretic.     Comments: Patient sitting comfortably in bed in no acute distress  HENT:     Head: Normocephalic and atraumatic.     Nose: Nose normal.     Mouth/Throat:     Mouth: Mucous membranes are moist.     Pharynx: No oropharyngeal exudate or posterior oropharyngeal erythema.  Eyes:     Extraocular Movements: Extraocular movements intact.     Conjunctiva/sclera: Conjunctivae normal.     Pupils: Pupils are equal, round, and reactive to light.  Cardiovascular:     Rate and Rhythm: Regular rhythm. Tachycardia present.     Heart sounds: Normal heart sounds.  Pulmonary:     Effort: Pulmonary effort is normal. No respiratory distress.     Breath sounds: Normal breath sounds.   Abdominal:     General: Abdomen is flat.     Palpations: Abdomen is soft.  Musculoskeletal:        General: Normal range of motion.     Cervical back: Normal range of motion and neck supple.  Lymphadenopathy:     Cervical: No cervical adenopathy.  Skin:    General: Skin is warm and dry.  Neurological:     General: No focal deficit present.     Mental Status: He is alert.  Psychiatric:        Mood and Affect: Mood normal.        Behavior: Behavior normal.    ED Results / Procedures / Treatments   Labs (all labs ordered are listed, but only abnormal results are displayed) Labs Reviewed - No data to display  EKG None  Radiology No results found.  Procedures Procedures   Medications Ordered in ED Medications - No  data to display  ED Course  I have reviewed the triage vital signs and the nursing notes.  Pertinent labs & imaging results that were available during my care of the patient were reviewed by me and considered in my medical decision making (see chart for details).    MDM Rules/Calculators/A&P                         Patient presents today with 1 day of cough and congestion.  Home COVID test positive.  His mom was able to get Paxlovid which gave her relief and he is requesting same.  His lungs are clear to auscultation in all fields.  Vital signs reassuring.  He is satting 100% on room air, speaking in complete sentences, nontoxic-appearing, and in no acute distress.  Patient's symptoms are consistent with URI.  Unfortunately, patient is not a candidate for Paxlovid as he does not meet high risk criteria.  Also discussed that antibiotics are not indicated for viral infections. Pt will be discharged with symptomatic treatment.  Verbalizes understanding and is agreeable with plan. Pt is hemodynamically stable & in NAD prior to dc. Discharged in stable condition.      Final Clinical Impression(s) / ED Diagnoses Final diagnoses:  COVID    Rx / DC Orders ED  Discharge Orders          Ordered    benzonatate (TESSALON) 100 MG capsule  Every 8 hours        04/20/21 1302          An After Visit Summary was printed and given to the patient.    Nestor Lewandowsky 04/20/21 1304    Milton Ferguson, MD 04/22/21 [phone]

## 2021-05-14 ENCOUNTER — Encounter (HOSPITAL_COMMUNITY): Payer: Self-pay | Admitting: *Deleted

## 2021-05-14 ENCOUNTER — Emergency Department (HOSPITAL_COMMUNITY)
Admission: EM | Admit: 2021-05-14 | Discharge: 2021-05-18 | Disposition: A | Discharge status: Nursing Home (Includes ICF) | Payer: Self-pay | Attending: Emergency Medicine | Admitting: Emergency Medicine

## 2021-05-14 ENCOUNTER — Other Ambulatory Visit: Payer: Self-pay

## 2021-05-14 DIAGNOSIS — F061 Catatonic disorder due to known physiological condition: Secondary | ICD-10-CM | POA: Diagnosis present

## 2021-05-14 DIAGNOSIS — Z79899 Other long term (current) drug therapy: Secondary | ICD-10-CM | POA: Insufficient documentation

## 2021-05-14 DIAGNOSIS — Z20822 Contact with and (suspected) exposure to covid-19: Secondary | ICD-10-CM | POA: Insufficient documentation

## 2021-05-14 DIAGNOSIS — F29 Unspecified psychosis not due to a substance or known physiological condition: Secondary | ICD-10-CM | POA: Insufficient documentation

## 2021-05-14 DIAGNOSIS — Z008 Encounter for other general examination: Secondary | ICD-10-CM

## 2021-05-14 DIAGNOSIS — F259 Schizoaffective disorder, unspecified: Secondary | ICD-10-CM | POA: Diagnosis present

## 2021-05-14 LAB — COMPREHENSIVE METABOLIC PANEL
ALT: 22 U/L (ref 0–44)
AST: 25 U/L (ref 15–41)
Albumin: 4.9 g/dL (ref 3.5–5.0)
Alkaline Phosphatase: 90 U/L (ref 38–126)
Anion gap: 14 (ref 5–15)
BUN: 19 mg/dL (ref 6–20)
CO2: 22 mmol/L (ref 22–32)
Calcium: 10.2 mg/dL (ref 8.9–10.3)
Chloride: 105 mmol/L (ref 98–111)
Creatinine, Ser: 1.15 mg/dL (ref 0.61–1.24)
GFR, Estimated: >60 mL/min (ref >60)
Glucose, Bld: 125 mg/dL — ABNORMAL HIGH (ref 70–99)
Potassium: 3.7 mmol/L (ref 3.5–5.1)
Sodium: 141 mmol/L (ref 135–145)
Total Bilirubin: 1.5 mg/dL — ABNORMAL HIGH (ref 0.3–1.2)
Total Protein: 8.1 g/dL (ref 6.5–8.1)

## 2021-05-14 LAB — CBC WITH DIFFERENTIAL/PLATELET
Abs Immature Granulocytes: 0.04 10*3/uL (ref 0.00–0.07)
Basophils Absolute: 0 10*3/uL (ref 0.0–0.1)
Basophils Relative: 1 %
Eosinophils Absolute: 0 10*3/uL (ref 0.0–0.5)
Eosinophils Relative: 1 %
HCT: 48.2 % (ref 39.0–52.0)
Hemoglobin: 16.4 g/dL (ref 13.0–17.0)
Immature Granulocytes: 1 %
Lymphocytes Relative: 20 %
Lymphs Abs: 1.2 10*3/uL (ref 0.7–4.0)
MCH: 29.3 pg (ref 26.0–34.0)
MCHC: 34 g/dL (ref 30.0–36.0)
MCV: 86.1 fL (ref 80.0–100.0)
Monocytes Absolute: 0.8 10*3/uL (ref 0.1–1.0)
Monocytes Relative: 13 %
Neutro Abs: 3.9 10*3/uL (ref 1.7–7.7)
Neutrophils Relative %: 64 %
Platelets: 271 10*3/uL (ref 150–400)
RBC: 5.6 MIL/uL (ref 4.22–5.81)
RDW: 12.5 % (ref 11.5–15.5)
WBC: 6.1 10*3/uL (ref 4.0–10.5)
nRBC: 0 % (ref 0.0–0.2)

## 2021-05-14 LAB — CARBAMAZEPINE LEVEL, TOTAL: Carbamazepine Lvl: <2 ug/mL — ABNORMAL LOW (ref 4.0–12.0)

## 2021-05-14 LAB — MAGNESIUM: Magnesium: 2 mg/dL (ref 1.7–2.4)

## 2021-05-14 LAB — RESP PANEL BY RT-PCR (FLU A&B, COVID) ARPGX2
Influenza A by PCR: NEGATIVE
Influenza B by PCR: NEGATIVE
SARS Coronavirus 2 by RT PCR: NEGATIVE

## 2021-05-14 LAB — SALICYLATE LEVEL: Salicylate Lvl: <7 mg/dL — ABNORMAL LOW (ref 7.0–30.0)

## 2021-05-14 LAB — PHOSPHORUS: Phosphorus: 3.9 mg/dL (ref 2.5–4.6)

## 2021-05-14 LAB — ACETAMINOPHEN LEVEL: Acetaminophen (Tylenol), Serum: <10 ug/mL — ABNORMAL LOW (ref 10–30)

## 2021-05-14 LAB — ETHANOL: Alcohol, Ethyl (B): <10 mg/dL (ref <10)

## 2021-05-14 MED ORDER — CARBAMAZEPINE 200 MG PO TABS
200.0000 mg | ORAL_TABLET | Freq: Once | ORAL | Status: DC
Start: 2021-05-14 — End: 2021-05-14

## 2021-05-14 MED ORDER — LORAZEPAM 2 MG/ML IJ SOLN
2.0000 mg | Freq: Once | INTRAMUSCULAR | Status: AC
  Administered 2021-05-14: 2 mg via INTRAVENOUS
  Filled 2021-05-14: qty 1

## 2021-05-14 MED ORDER — SODIUM CHLORIDE 0.9 % IV BOLUS
1000.0000 mL | Freq: Once | INTRAVENOUS | Status: AC
  Administered 2021-05-14: 1000 mL via INTRAVENOUS

## 2021-05-14 NOTE — ED Provider Triage Note (Signed)
Emergency Medicine Provider Triage Evaluation Note  Brendan Hogan , a 26 y.o. male  was evaluated in triage.  Pt complains of not eating/drinking, not speaking, and not acting like himself for about 4 days.  No vomiting.  No acute stressors or traumatic event recently.  Family concerned about his mental health.  No reported SI/HI or attempts at self harm they are aware of.  Review of Systems  Positive: Not eating/drinking, not speaking, mental health eval Negative: fever  Physical Exam  BP 134/88    Pulse (!) 128    Temp 99.7 F (37.6 C) (Oral)    Resp (!) 22    SpO2 96%   Gen:   Awake, no distress   Resp:  Normal effort  MSK:   Moves extremities without difficulty  Other:  Continuously tapping feet during exam, does not answer when spoken to, staring at father  Medical Decision Making  Medically screening exam initiated at 4:18 AM.  Appropriate orders placed.  ADIB KUNZER was informed that the remainder of the evaluation will be completed by another provider, this initial triage assessment does not replace that evaluation, and the importance of remaining in the ED until their evaluation is complete.  Not eating/drinking or speaking.  Family concerned and desiring mental health evaluation.  Does have history of psychosis, hospitalized for same in the past.  Will check labs, covid screen.   Larene Pickett, PA-C 05/14/21 0425

## 2021-05-14 NOTE — BH Assessment (Addendum)
Comprehensive Clinical Assessment (CCA) Note  05/14/2021 Brendan Hogan [id number]  DISPOSITION: Gave clinical report to Margorie John, PA-C who determined Pt meet criteria for inpatient psychiatric treatment. Wynonia Hazard, [facility], states appropriate bed is not available. Notified Dr Davonna Belling and Flint Melter, RN of recommendation via secure message. Notified Pt's father/legal guardian of recommendation.  The patient demonstrates the following risk factors for suicide: Chronic risk factors for suicide include: psychiatric disorder of schizophrenia . Acute risk factors for suicide include: social withdrawal/isolation and recent discharge from inpatient psychiatry. Protective factors for this patient include: positive social support and positive therapeutic relationship. Considering these factors, the overall suicide risk at this point appears to be low. Patient is not appropriate for outpatient follow up due to psychotic symptoms.  [place] ED from 05/14/2021 in [place] ED from 04/20/2021 in [place] DEPT ED from 03/23/2021 in [place] No Risk No Risk No Risk      Patient is a 26 year old single male who presents unaccompanied to [facility] emergency department due to psychotic symptoms. Patient has a diagnosis of schizophrenia and was last psychiatrically hospitalized in November 2022 at [facility]. According to patient's father/legal guardian, Brendan Hogan [phone], patient has been noncompliant with psychiatric medications, only taking Ambien but not in an effort to sleep. Patient's mother reports he will hide pills under his tongue and hide them around the house. Patient's father reports that four to five days ago patient began not eating and drinking very little. He was encouraged to eat but refused. Patient's father reports that  patient has not slept in four days. Patient's father says patient was initially restless and pacing but then would sit on his bed and not move for 10-12 hours. He states patient was observed crying and talking to himself.  During assessment, patient appears confused and has difficulty answering most questions. He was able to give his name and stated that he was at [facility]. He denied suicidal ideation. He denied thoughts of harming others. Patient did not respond when asked if he was experiencing auditory or visual hallucinations, appearing not to understand the question. He did not respond when asked if he had used any alcohol or other substances; urine drug screen is in process.  Patient's parents are concerned that patient has a pattern of being cooperative on an inpatient psychiatric unit but then refuses to take medications. They believe patient needs to be in facility for 3-4 weeks so he understands his illness and the importance of medication.   Patient is dressed in hospital scrubs, awake and oriented person and place. Pt speaks in a soft tone, at low volume and slow pace. Motor behavior appears somewhat lethargic. Eye contact is minimal. Pt's mood appears anxious and affect is constricted. Judgment is impaired.  Chief Complaint:  Chief Complaint  Patient presents with   Psychiatric Evaluation   Visit Diagnosis: F20.9 Schizophrenia   CCA Screening, Triage and Referral (STR)  Patient Reported Information How did you hear about [place]? Family/Friend  What Is the Reason for Your Visit/Call Today? Pt has diagnosis of schizophrenia and has not been taking prescribed psychiatric medications. Pt's parents reports he stopped eating 4-5 days ago, drinking only water, and has not been sleeping. They report he was initially restless and pacing then refused to move for hours. They say he has been talking to himself. Pt  appears confused and has difficulty answering questions.  How  Long Has This Been Causing You Problems? 1-6 months  What Do You Feel Would Help You the Most Today? Medication(s)   Have You Recently Had Any Thoughts About Hurting Yourself? No  Are You Planning to Commit Suicide/Harm Yourself At This time? No   Have you Recently Had Thoughts About [place]? No  Are You Planning to Harm Someone at This Time? No  Explanation: No data recorded  Have You Used Any Alcohol or Drugs in the Past 24 Hours? No  How Long Ago Did You Use Drugs or Alcohol? No data recorded What Did You Use and How Much? No data recorded  Do You Currently Have a Therapist/Psychiatrist? Yes  Name of Therapist/Psychiatrist: Evette Doffing A. Izediuno, MD   Have You Been Recently Discharged From Any Office Practice or Programs? Yes  Explanation of Discharge From Practice/Program: Pt was psychiatrically hospitalized at [facility] in November 2022     CCA Screening Triage Referral Assessment Type of Contact: Tele-Assessment  Telemedicine Service Delivery: Telemedicine service delivery: This service was provided via telemedicine using a 2-way, interactive audio and video technology  Is this Initial or Reassessment? Initial Assessment  Date Telepsych consult ordered in [facility]:  05/14/21  Time Telepsych consult ordered in [facility]:  [place]  Location of Assessment: [facility] ED  Provider Location: [facility] Assessment Services   Collateral Involvement: Father/legal guardian: Brendan Hogan   Does Patient Have a [place]? No data recorded Name and Contact of Legal Guardian: No data recorded If Minor and Not Living with Parent(s), Who has Custody? NA  Is CPS involved or ever been involved? Never  Is APS involved or ever been involved? Never   Patient Determined To Be At Risk for Harm To Self or Others Based on Review of Patient Reported Information or Presenting Complaint? No  Method: No Plan  Availability of Means: No access or NA  Intent:  Vague intent or NA  Notification Required: No need or identified person  Additional Information for Danger to Others Potential: No data recorded Additional Comments for Danger to Others Potential: No data recorded Are There Guns or Other Weapons in Your Home? No  Types of Guns/Weapons: No data recorded Are These Weapons Safely Secured?                            No data recorded Who Could Verify You Are Able To Have These Secured: No data recorded Do You Have any Outstanding Charges, Pending Court Dates, Parole/Probation? Denies  Contacted To Inform of Risk of Harm To Self or Others: Family/Significant Other:    Does Patient Present under Involuntary Commitment? No  IVC Papers Initial File Date: No data recorded  [place] of Residence: [place]   Patient Currently Receiving the Following Services: Medication Management   Determination of Need: Emergent (2 hours)   Options For Referral: Inpatient Hospitalization; Medication Management     CCA Biopsychosocial Patient Reported Schizophrenia/Schizoaffective Diagnosis in Past: Yes   Strengths: friendly   Mental Health Symptoms Depression:   Change in energy/activity; Difficulty Concentrating; Fatigue; Increase/decrease in appetite; Irritability; Sleep (too much or little); Tearfulness   Duration of Depressive symptoms:  Duration of Depressive Symptoms: Less than two weeks   Mania:   Change in energy/activity   Anxiety:    Tension; Sleep; Restlessness; Irritability; Fatigue; Difficulty concentrating   Psychosis:   Affective flattening/alogia/avolition   Duration of  Psychotic symptoms:  Duration of Psychotic Symptoms: Greater than six months   Trauma:   None   Obsessions:   None   Compulsions:   None   Inattention:   N/A   Hyperactivity/Impulsivity:   N/A   Oppositional/Defiant Behaviors:   N/A   Emotional Irregularity:   Mood lability   Other Mood/Personality Symptoms:   NA    Mental  Status Exam Appearance and self-care  Stature:   Average   Weight:   Average weight   Clothing:   -- (Scrubs)   Grooming:   Normal   Cosmetic use:   None   Posture/gait:   Normal   Motor activity:   Not Remarkable   Sensorium  Attention:   Confused; Inattentive   Concentration:   Variable   Orientation:   Person; Place   Recall/memory:   Defective in Recent   Affect and Mood  Affect:   Constricted   Mood:   Anxious   Relating  Eye contact:   Fleeting   Facial expression:   Constricted   Attitude toward examiner:   Cooperative; Uninterested   Thought and Language  Speech flow:  Paucity   Thought content:   Appropriate to Mood and Circumstances   Preoccupation:   None   Hallucinations:   None   Organization:  No data recorded  [employer] of Knowledge:   Average   Intelligence:   Average   Abstraction:  No data recorded  Judgement:   Impaired   Reality Testing:   Variable   Insight:   Lacking   Decision Making:   Vacilates   Social Functioning  Social Maturity:   Isolates; Irresponsible   Social Judgement:   Heedless   Stress  Stressors:   Family conflict; Housing; Museum/gallery curator; Work   Coping Ability:   Exhausted; Overwhelmed   Skill Deficits:   Interpersonal   Supports:   Family     Religion: Religion/Spirituality Are You A Religious Person?: No  Leisure/Recreation: Leisure / Recreation Do You Have Hobbies?: No  Exercise/Diet: Exercise/Diet Do You Exercise?: No Have You Gained or Lost A Significant Amount of Weight in the Past Six Months?: No Do You Follow a Special Diet?: Yes Type of Diet: "I try not to eat pork" Do You Have Any Trouble Sleeping?: No Explanation of Sleeping Difficulties: Pt has not slept in 4 days   CCA Employment/Education Employment/Work Situation: Employment / Work Situation Employment Situation: Employed Work Stressors: Works for [employer] Describe how  Patient's Job has Been Impacted: Unable to work do to mental health symptoms Has Patient ever Been in Passenger transport manager?: No  Education: Education Is Patient Currently Attending School?: No Did You Nutritional therapist?: Yes What Type of College Degree Do you Have?: Yes; "Some college" Did You Have An Individualized Education Program (IIEP): No Did You Have Any Difficulty At [employer]?: No Patient's Education Has Been Impacted by Current Illness: No   CCA Family/Childhood History Family and Relationship History: Family history Marital status: Single Does patient have children?: No  Childhood History:  Childhood History By whom was/is the patient raised?: Both parents Did patient suffer any verbal/emotional/physical/sexual abuse as a child?: No Did patient suffer from severe childhood neglect?: No Has patient ever been sexually abused/assaulted/raped as an adolescent or adult?: No Was the patient ever a victim of a crime or a disaster?: No Witnessed domestic violence?: No Has patient been affected by domestic violence as an adult?: No  Child/Adolescent Assessment:  CCA Substance Use Alcohol/Drug Use: Alcohol / Drug Use Pain Medications: see MAR Prescriptions: see MAR Over the Counter: see MAR History of alcohol / drug use?: Yes Longest period of sobriety (when/how long): n/a Substance #1 Name of Substance 1: Patient has a hx of THC use. Age of first use for alcohol is 26 yrs old. Frequency of use was socially, 1x per weekend. Amount would vary. He does not recall when he last used THC. 1 - Age of First Use: 17 1 - Amount (size/oz): varies 1 - Frequency: varies; "social use", "1x per weekend" 1 - Duration: on-going 1 - Last Use / Amount: unknown 1 - Method of Aquiring: varies 1- Route of Use: Inhalation Substance #2 Name of Substance 2: Patient has a hx of alcohol. Age of first use for alcohol is 26 yrs old. He was drinking alcohol occasionally in the past. Amount would vary.  Last use of alcohol was 3 months ago. 2 - Age of First Use: 26 yrs old 2 - Amount (size/oz): varies 2 - Frequency: varies 2 - Duration: on-going 2 - Last Use / Amount: unknown 2 - Method of Aquiring: varies 2 - Route of Substance Use: oral                     ASAM's:  Six Dimensions of Multidimensional Assessment  Dimension 1:  Acute Intoxication and/or Withdrawal Potential:      Dimension 2:  Biomedical Conditions and Complications:      Dimension 3:  Emotional, Behavioral, or Cognitive Conditions and Complications:     Dimension 4:  Readiness to Change:     Dimension 5:  Relapse, Continued use, or Continued Problem Potential:     Dimension 6:  Recovery/Living Environment:     ASAM Severity Score:    ASAM Recommended Level of Treatment:     Substance use Disorder (SUD) Substance Use Disorder (SUD)  Checklist Symptoms of Substance Use: Continued use despite having a persistent/recurrent physical/psychological problem caused/exacerbated by use  Recommendations for Services/Supports/Treatments: Recommendations for Services/Supports/Treatments Recommendations For Services/Supports/Treatments: Medication Management, Intensive In-Home Services, ACCTT (Assertive Community Treatment), Inpatient Hospitalization, Individual Therapy  Discharge Disposition: Discharge Disposition Medical Exam completed: Yes  DSM5 Diagnoses: Patient Active Problem List   Diagnosis Date Noted   Schizoaffective disorder ([place]) 11/16/2019   Primary insomnia 11/04/2019   Schizophrenia ([place]) 10/19/2019   Psychosis ([place]) 10/19/2019   Other bipolar disorder ([place])    Brief psychotic disorder ([place]) 09/07/2019     Referrals to Alternative Service(s): Referred to Alternative Service(s):   Place:   Date:   Time:    Referred to Alternative Service(s):   Place:   Date:   Time:    Referred to Alternative Service(s):   Place:   Date:   Time:    Referred to Alternative Service(s):   Place:   Date:    Time:     Evelena Peat, [facility]

## 2021-05-14 NOTE — ED Provider Notes (Signed)
°  Physical Exam  BP 123/79 (BP Location: Right Arm)    Pulse 89    Temp 98.1 F (36.7 C) (Oral)    Resp 16    Ht 5\' 8"  (1.727 m)    Wt 63.5 kg    SpO2 100%    BMI 21.29 kg/m   Physical Exam  Procedures  Procedures  ED Course / MDM    Medical Decision Making  Seen by psychiatry inpatient treatment recommended       Davonna Belling, MD 05/14/21 2301

## 2021-05-14 NOTE — ED Triage Notes (Signed)
The pt has been brought in by his parents  he has not been eating or drinking  for 4 days at present the pt is not making any eye contact the pt just sits in the wheelchair quivering all over his body  apparently  he has  a long list of psy meds

## 2021-05-14 NOTE — ED Provider Notes (Signed)
[facility] EMERGENCY DEPARTMENT Provider Note   CSN: [id number] Arrival date & time: 05/14/21  0406     History  Chief Complaint  Patient presents with   Psychiatric Evaluation    Brendan Hogan is a 26 y.o. male.  Past medical history includes bipolar, schizophrenia, schizoaffective, and psychosis.  Patient presents the emergency department with 4 days of decreased interactions, insomnia, decreased p.o. intake.  According to family, patient has not eaten or drink anything in 4 days.  He has been unable to take any of his medications.  They state that he has been seen in the emergency department for psychotic episodes and having insomnia, however he has never gone at this extreme.  They have not noticed any other abnormal changes.  HPI     Home Medications Prior to Admission medications   Medication Sig Start Date End Date Taking? Authorizing Provider  ARIPiprazole (ABILIFY) 20 MG tablet Take 20 mg by mouth at bedtime. Patient not taking: Reported on 05/14/2021 03/20/21   [provider]  nicotine polacrilex (NICORETTE) 2 MG gum Take 1 each (2 mg total) by mouth as needed for smoking cessation. Patient not taking: Reported on 11/15/2019 10/25/19   Connye Burkitt, NP  zolpidem (AMBIEN) 10 MG tablet Take 10 mg by mouth daily. Patient not taking: Reported on 05/14/2021 06/24/20   [provider]      Allergies    Haldol [haloperidol]    Review of Systems   Review of Systems  Constitutional:  Positive for appetite change.  Psychiatric/Behavioral:  Positive for behavioral problems and sleep disturbance.   All other systems reviewed and are negative.  Physical Exam Updated Vital Signs BP 130/79 (BP Location: Right Arm)    Pulse 99    Temp 98.7 F (37.1 C) (Oral)    Resp 20    Ht 5\' 8"  (1.727 m)    Wt 63.5 kg    SpO2 98%    BMI 21.29 kg/m  Physical Exam Vitals and nursing note reviewed.  Constitutional:      General: He is not in acute  distress.    Appearance: Normal appearance. He is well-developed. He is not ill-appearing, toxic-appearing or diaphoretic.  HENT:     Head: Normocephalic and atraumatic.     Nose: No nasal deformity.     Mouth/Throat:     Lips: Pink. No lesions.  Eyes:     General: Gaze aligned appropriately. No scleral icterus.       Right eye: No discharge.        Left eye: No discharge.     Extraocular Movements: Extraocular movements intact.     Conjunctiva/sclera: Conjunctivae normal.     Right eye: Right conjunctiva is not injected. No exudate or hemorrhage.    Left eye: Left conjunctiva is not injected. No exudate or hemorrhage.    Pupils: Pupils are equal, round, and reactive to light.     Comments: Is not able to formally do EOM testing, however he does track and can look to each side.  Cardiovascular:     Rate and Rhythm: Normal rate and regular rhythm.     Pulses: Normal pulses.     Heart sounds: Normal heart sounds. No murmur heard.   No friction rub. No gallop.  Pulmonary:     Effort: Pulmonary effort is normal. No respiratory distress.     Breath sounds: Normal breath sounds. No stridor. No wheezing, rhonchi or rales.  Abdominal:  General: Abdomen is flat. There is no distension.     Palpations: Abdomen is soft.     Tenderness: There is no abdominal tenderness. There is no guarding or rebound.  Musculoskeletal:     Right lower leg: No edema.     Left lower leg: No edema.  Skin:    General: Skin is warm and dry.     Coloration: Skin is not jaundiced or pale.     Findings: No rash.  Neurological:     Mental Status: He is alert.     Comments: He is oriented to person.  Does not interact much beyond this.  He does follow commands with repeated questioning He is able to move all 4 extremities spontaneously.  Psychiatric:        Attention and Perception: He is inattentive.        Speech: He is noncommunicative.        Behavior: Behavior is slowed and withdrawn. Behavior is  cooperative.        Thought Content: Thought content does not include homicidal or suicidal ideation. Thought content does not include homicidal or suicidal plan.     Comments: Patient is generally noninteractive. He did tell me his name, but did not speak beyond this. He is mostly sitting in bed looking forward. He was able to track and follow basic commands after repeated requests. He has lower extremity shaking bilaterally. He made impulsive movements at one point during my interview where he was looking around very intently to the right like he may have heard or saw something on that side. He then quickly returned back to staring to the front.  He is unable to verbalize HI and SI    ED Results / Procedures / Treatments   Labs (all labs ordered are listed, but only abnormal results are displayed) Labs Reviewed  COMPREHENSIVE METABOLIC PANEL - Abnormal; Notable for the following components:      Result Value   Glucose, Bld 125 (*)    Total Bilirubin 1.5 (*)    All other components within normal limits  SALICYLATE LEVEL - Abnormal; Notable for the following components:   Salicylate Lvl [id number] (*)    All other components within normal limits  ACETAMINOPHEN LEVEL - Abnormal; Notable for the following components:   Acetaminophen (Tylenol), Serum <10 (*)    All other components within normal limits  RESP PANEL BY RT-PCR (FLU A&B, COVID) ARPGX2  CBC WITH DIFFERENTIAL/PLATELET  ETHANOL  MAGNESIUM  PHOSPHORUS  RAPID URINE DRUG SCREEN, HOSP PERFORMED  CARBAMAZEPINE LEVEL, TOTAL    EKG None  Radiology No results found.  Procedures Procedures   Medications Ordered in ED Medications  sodium chloride 0.9 % bolus 1,000 mL (0 mLs Intravenous Stopped 05/14/21 1034)  LORazepam (ATIVAN) injection 2 mg (2 mg Intravenous Given 05/14/21 0934)    ED Course/ Medical Decision Making/ A&P                           Medical Decision Making Amount and/or Complexity of Data Reviewed Independent  Historian: parent and guardian External Data Reviewed: labs, radiology, ECG and notes. Labs: ordered. Decision-making details documented in ED Course.  Risk Prescription drug management. Parenteral controlled substances. Diagnosis or treatment significantly limited by social determinants of health.   This is a 26 y.o. male with a PMH of bipolar, schizophrenia, schizoaffective, and psychosis who presents to the ED with decreased interaction, insomnia, and decreased PO intake over  the past four days. He has not taken any of his medications for four days.   Review of Past Records: On Cogentin and Abilify. He was admitted in 2021 for acute psychosis. I cannot find any records where patient had been seen for a catatonic like episode  Vitals:  stable.  Exam: see psych exam in detail under PE  Initial Impression: Patient seems catatonic. He is somewhat interactive, however mostly is not interactive with me at all. Patient will require psychiatric evaluation.  I personally reviewed all laboratory work and imaging. Abnormal results outlined below. Labs are overall unremarkable with normal tox screen. CBC unremarkable. CMP with no signs of electrolyte or glucose abnormality.   Will check Mag, phos, tegretol level. Plan to give IVF. Ativan given for agitation  Mag and phos normal. IVF finished. Medically cleared for psych eval.  Events/Interventions: Plan to consult TTS.   I have seen and evaluated this patient in conjunction with my attending physician who agrees and has made changes to the plan accordingly.  Portions of this note were generated with Lobbyist. Dictation errors may occur despite best attempts at proofreading.  Final Clinical Impression(s) / ED Diagnoses Final diagnoses:  Encounter for psychological evaluation    Rx / DC Orders ED Discharge Orders     None         Adolphus Birchwood, PA-C 05/14/21 1108    Isla Pence, MD 05/14/21 1557

## 2021-05-14 NOTE — ED Notes (Signed)
Pt wanded by security. 

## 2021-05-14 NOTE — ED Notes (Signed)
Provided pt with a [place] bag and pop

## 2021-05-15 ENCOUNTER — Encounter (HOSPITAL_COMMUNITY): Payer: Self-pay | Admitting: Registered Nurse

## 2021-05-15 DIAGNOSIS — F061 Catatonic disorder due to known physiological condition: Secondary | ICD-10-CM | POA: Diagnosis present

## 2021-05-15 LAB — RAPID URINE DRUG SCREEN, HOSP PERFORMED
Amphetamines: NOT DETECTED
Barbiturates: NOT DETECTED
Benzodiazepines: POSITIVE — AB
Cocaine: NOT DETECTED
Opiates: NOT DETECTED
Tetrahydrocannabinol: NOT DETECTED

## 2021-05-15 MED ORDER — LORAZEPAM 1 MG PO TABS
2.0000 mg | ORAL_TABLET | Freq: Three times a day (TID) | ORAL | Status: DC
  Administered 2021-05-17 (×3): 2 mg via ORAL
  Filled 2021-05-15 (×5): qty 2

## 2021-05-15 MED ORDER — LORAZEPAM 2 MG/ML IJ SOLN
2.0000 mg | Freq: Three times a day (TID) | INTRAMUSCULAR | Status: DC
  Administered 2021-05-15 – 2021-05-16 (×5): 2 mg via INTRAMUSCULAR
  Filled 2021-05-15 (×5): qty 1

## 2021-05-15 NOTE — Progress Notes (Signed)
[facility] Pending bed offer  At 6:38pm CSW made multiple attempts to contact [facility] but was unsuccessful [phone].  At 7:06pm CSW called the main [facility] [phone] number and was advised that the intake staff was not available. CSW did leave a message on the intake line requesting a phone call back.  CSW did receive a phone call back from [place] with [facility] [phone] who advised to continue to leave messages on the Intake line for follow-up after 1st shift and intake will follow back once available. Misty Stanley shared that there is a rotation of staff for intake that cover in the evening. Misty Stanley requested the following for a pending acceptance: Guardianship documents, and updated UA.  CSW communicated with nursing via secure chat: Nutritional therapist, RN to advise of the requesting items. RN informed CSW of the location of Guardianship documents, and advised that UA was ordered earlier.   CSW will fax Guardianship documents Misty Stanley. CSW is still awaiting results of UA. CSW will assist and follow.   Maryjean Ka, MSW, LCSWA 05/15/2021 9:40 PM

## 2021-05-15 NOTE — Progress Notes (Signed)
Patient has been denied by [facility] due to no appropriate beds available. Patient meets [facility] inpatient criteria per Assunta Found, NP. Patient has been faxed out to the following facilities:   [facility]  [address] [place] [address] [phone]  [facility]  [address], [place] [address] [phone]  [facility]  [address], [place] [address] [phone]  [facility]  [address] [place] [address] [phone]  [facility]  [address] [place], [place] [address] [phone]  [facility]  [address] [place], [place] [address] [phone]  [facility]  [address] [place] [address] [phone]  [facility]  [address], [place] [address] [phone]  [facility]  [address] [place] [address] [phone]    Damita Dunnings, MSW, LCSW-A  1:07 PM 05/15/2021

## 2021-05-15 NOTE — Progress Notes (Signed)
[facility] for [facility] inpatient and has been requested to fax guardianship paperwork and medical clearance note from ED MD. CSW spoke with MD and ED RN regarding medical clearance and faxed documentation to support to MP. Per ED RN, guardianship paperwork is uploaded and on file. 2nd CSW will follow up regarding acceptance.    Damita Dunnings, MSW, LCSW-A  3:02 PM 05/15/2021

## 2021-05-15 NOTE — ED Notes (Signed)
Refused to take pills. Pt continues to lay on bed. Refused to talk to staff, refused food or drinks. IM Ativan given at this time per psych's orders. Pt was cooperative with security at bedside for safety. Will continue to monitor.

## 2021-05-15 NOTE — ED Notes (Signed)
Pt refused to eat breakfast at this time. Father is now at bedside. Will continue to monitor.

## 2021-05-15 NOTE — Progress Notes (Signed)
At 6:31pm CSW made multiple attempts to contact [facility] but was unsuccessful.   Benjaman Kindler, MSW, LCSWA 05/15/2021 9:22 PM

## 2021-05-15 NOTE — ED Notes (Signed)
Placed Breakfast Order 

## 2021-05-15 NOTE — ED Notes (Signed)
Pt mother and father currently came in to visit one at a time. Pt refused to eat breakfast this AM and refused to talk to staff. Pt has no eye contact observed and pt looks away when staff is in front of him. Refuses to answer questions at this time. Pt mother is trying to feed him breakfast. Safety precautions maintained. Will continue to monitor.

## 2021-05-15 NOTE — Consult Note (Signed)
°  Brendan Hogan 26 y.o. male patient with psychiatric history of schizoaffective disorder and schizophrenia was admitted to [facility] ED after presenting via his parents with complaints that patient has a history of schizophrenia, and he hasn't been taking his medications.  Reporting that patient hasn't been eating and only /drinking water for the last 4-5 days.   Reports patient is not sleeping, pacing then refuses to move for hours.    Attempted psychiatric evaluation via tele health on 05/15/2021, chart reviewed, and consulted with Dr. Nelly Rout.  Unable to complete assessment related to patient not speaking.  Patient lying supine in bed looking up at ceiling. Occasional glance towards the camera.  Patient will now answer any questions.  He is only moving his feet back and forth.    Patient nurse reports that patient parents came earlier around 9:30 AM and tried to feed patient breakfast and tried to get him to drink something but unsuccessful.  States she hasn't seen patient eat/drink anything and hasn't seen go to the bathroom.  Reports she has noticed patient moving his arms but he will not look at anyone.    Medication management:  for Catatonia.    LORazepam  2 mg Oral TID   Or   LORazepam  2 mg Intramuscular TID     Disposition:  Recommended for inpatient psychiatric treatment   Secure message sent to patients nurse, social work and [facility] informing:  Patient recommended for inpatient psychiatric treatment.  If no bed available at [facility] patient is to be faxed out.

## 2021-05-15 NOTE — ED Notes (Addendum)
Family at bedside to visit. Mother was able to get patient to eat his dinner.

## 2021-05-15 NOTE — ED Provider Notes (Signed)
YesterdayPatient was medically cleared at 4 PM today.  Patient is still medically cleared and can be dispositioned by psych   Bethann Berkshire, MD 05/15/21 1451

## 2021-05-15 NOTE — ED Notes (Signed)
Sitter has left, no one sent to replace them.  °

## 2021-05-15 NOTE — ED Notes (Signed)
Mother at bedside, asked for update on patient's status.

## 2021-05-15 NOTE — ED Notes (Signed)
Lunch Ordered °

## 2021-05-15 NOTE — ED Notes (Signed)
Psych attempted to talk to pt with no success. Pt refused to talk to psych at this time.

## 2021-05-15 NOTE — ED Notes (Signed)
Patient came to door asking for the bathroom, showed to restroom by sitter. Patient walked halfway then turned around and went back into his room. Patient given a urinal which he used. Sample sent to lab.

## 2021-05-16 LAB — URINALYSIS, ROUTINE W REFLEX MICROSCOPIC
Bilirubin Urine: NEGATIVE
Glucose, UA: NEGATIVE mg/dL
Ketones, ur: 15 mg/dL — AB
Leukocytes,Ua: NEGATIVE
Nitrite: NEGATIVE
Protein, ur: NEGATIVE mg/dL
Specific Gravity, Urine: >1.03 — ABNORMAL HIGH (ref 1.005–1.030)
pH: 5.5 (ref 5.0–8.0)

## 2021-05-16 LAB — URINALYSIS, MICROSCOPIC (REFLEX): Squamous Epithelial / HPF: NONE SEEN (ref 0–5)

## 2021-05-16 NOTE — ED Notes (Signed)
TC from [facility] located in [place]. Requesting Pt to be IVC for possible acceptance. Fax # [phone] ,call reports to [phone] contact person [place] or Sam.

## 2021-05-16 NOTE — Progress Notes (Signed)
CSW spoke with [facility] regarding potential acceptance. Per the Intake RN, they are unable to accept the patient at this time due to acceptance restrictions on their unit. The Intake RN agreed to re-consider the patient once the acceptance restriction is lifted. CSW has agreed to fax in UA and UA drug screening results.    Damita Dunnings, MSW, LCSW-A  11:53 AM 05/16/2021

## 2021-05-16 NOTE — ED Provider Notes (Signed)
Psychiatry team to admit.  IVC filled out per psychiatry recommendations.   Virgina Norfolk, DO 05/16/21 [phone]

## 2021-05-16 NOTE — ED Notes (Signed)
IVC paper work given to [employer] who will fill out and give to [facility].

## 2021-05-16 NOTE — Progress Notes (Signed)
Patient has been denied by [facility] due to no appropriate beds available. Patient meets [facility] inpatient criteria per Assunta Found, NP. Patient has been faxed out to the following facilities:    [facility]  [address], [place] [address] [phone]  [facility]  [address], [place] [address] [phone]  [facility]  [address], [place] [address] [phone]  [facility]  [address] [place] [address] [phone]  [facility]  [address] [place], [place] [address] [phone]  [facility]  [address] [place], [place] [address] [phone]  [facility]  [address] [place] [address] [phone]  [facility]  [address] [place] [address] [phone]  [facility]  [address] [place] [address] [phone]   Damita Dunnings, MSW, LCSW-A  3:23 PM 05/16/2021

## 2021-05-16 NOTE — ED Notes (Signed)
:  Pt refused pills

## 2021-05-16 NOTE — Progress Notes (Signed)
CSW spoke with Misty Stanley with Intake at [facility] who reports that pt has been denied due to high acuity within the facility's male unit. Misty Stanley advised that there will be male discharges after Friday and to follow up after Friday. CSW will continue to assist pt with placement.    Maryjean Ka, MSW, LCSWA 05/16/2021 10:08 PM

## 2021-05-16 NOTE — ED Notes (Signed)
Pt's Farther who is the Legal Guar here to see PT. Father updated on possible acceptance to [facility]. Father informed this Clinical research associate will call with up date.

## 2021-05-17 DIAGNOSIS — F202 Catatonic schizophrenia: Secondary | ICD-10-CM

## 2021-05-17 LAB — POC SARS CORONAVIRUS 2 AG -  ED: SARSCOV2ONAVIRUS 2 AG: NEGATIVE

## 2021-05-17 MED ORDER — RISPERIDONE 1 MG PO TBDP
0.5000 mg | ORAL_TABLET | Freq: Every day | ORAL | Status: DC
  Administered 2021-05-17: 0.5 mg via ORAL
  Filled 2021-05-17: qty 1

## 2021-05-17 MED ORDER — RISPERIDONE 1 MG PO TBDP
2.0000 mg | ORAL_TABLET | Freq: Every day | ORAL | Status: DC
  Administered 2021-05-17: 2 mg via ORAL
  Filled 2021-05-17: qty 2

## 2021-05-17 NOTE — ED Notes (Signed)
Talking to TTS at this time   

## 2021-05-17 NOTE — Progress Notes (Addendum)
Pt accepted to [facility] 05/18/21  Pt meets inpatient criteria per Liborio Nixon, NP  Attending Physician will be Dr. Toni Amend  Report can be called to: - [phone]  Care Team notified via secure chat: Guy Franco, RN, Edson Snowball, RN, Liborio Nixon, NP, Ophelia Shoulder, NP, Nelly Rout, MD, Mordecai Rasmussen, MD, Jorene Minors, RN, Rona Ravens, RN, Shuronia Pfluger, RN, Doyce Para, RN, Lindaann Pascal, RN.  Guy Franco, RN advised that he would call to coordinate transport and day shift nursing will call report.   Kelton Pillar, LCSWA 05/17/2021 @ 10:28 PM

## 2021-05-17 NOTE — ED Provider Notes (Signed)
Emergency Medicine Observation Re-evaluation Note  Brendan Hogan is a 26 y.o. male, seen on rounds today.  Pt initially presented to the ED for complaints of Psychiatric Evaluation Currently, the patient is sleeping.  Physical Exam  BP 95/67 (BP Location: Right Arm)    Pulse 80    Temp 97.8 F (36.6 C) (Oral)    Resp 18    Ht 5\' 8"  (1.727 m)    Wt 63.5 kg    SpO2 99%    BMI 21.29 kg/m  Physical Exam General: No distress Cardiac: Regular rate and rhythm Lungs: No increased work of breathing Psych: Calm  ED Course / MDM  EKG:   I have reviewed the labs performed to date as well as medications administered while in observation.  Recent changes in the last 24 hours include none.  Plan  Current plan is for placement. Md [name] is under involuntary commitment.      Carmin Muskrat, MD 05/17/21 [phone]

## 2021-05-17 NOTE — ED Notes (Signed)
Tech/Sitter offered Pt a shower, Pt stated no.

## 2021-05-17 NOTE — Consult Note (Signed)
Telepsych Consultation   Reason for Consult:  Psych consult  Referring Physician:  Sheila Oats Location of Patient: [facility] Location of Provider: [facility]  Patient Identification: Brendan Hogan MRN:  [id number] Principal Diagnosis: Catatonia associated with another mental disorder Diagnosis:  Principal Problem:   Catatonia associated with another mental disorder Active Problems:   Schizoaffective disorder ([place])   Total Time spent with patient: 20 minutes  Subjective:   Brendan Hogan is a 26 y.o. male patient presenting to [facility] with with history of schizophrenia and schizoaffective dx with complaints of patient has a history of schizophrenia and has not been taking his medications. He has not been eating or drinking water for the past 4 to to 5 days. He is not sleeping, he is pacing and refusing to move for hours.  HPI:  Patient seen and re-evaluated via tele health by this provider; chart reviewed and consulted with Dr. Dwyane Dee on 05/17/21. On evaluation [place] lying down in bed alert and oriented x 3. He vaguely participates in the assessment.   Patient states that he does not know why he was brought to the emergency department. When asked why he has not been eating, he states "I do not eat a lot but I do drink a lot."  When asked why he has not been compliant with taking his medications for mental health, he states that the medications are as needed and they are given to him for no reason. When asked if he can name his current medications, he states that he used to take Ambien last year but has not had it recently to help with sleep. He currently denies suicidal and homicidal ideations.He denies AVH. There is no objective evidence that he is responding to internal or external stimuli. He denies drinking alcohol or using illicit drugs. I discussed with the patient restarting Risperdal. Patient was advised that he was prescribed Risperdal in the past at [facility]. Patient states  that he does not need medications. Patient then stated that the had to use the restroom and left the room.   With the patient's verbal consent I attempted to contact his father Mr. Daron Offer to verify patient's current medication regimen. No answer, left [place] voicemail.   Per nursing, patient compliant with taking medications today, sleeping most of the morning, and did not eat breakfast.   Past Psychiatric History: hx of schizophrenia and schizoaffective dx. Last hospitalization at [facility] from 11/16/19 to 11/22/19. Per chart review: 11/22/19 patient was discharged home on Carbamazepine 200 mg po am and 300 mg QHS. Haldol 10 mg po BID, risperidone 3 mg po QHS and risperidone microspheres 25 mg IM every 14 days. Per Allergies list Haldol caused muscle spasms and involuntary muscle contractions.   Risk to Self:  denies  Risk to Others:  denies  Prior Inpatient Therapy: yes   Prior Outpatient Therapy:  yes   Past Medical History:  Past Medical History:  Diagnosis Date   Psychosis ([place]) 05/2019   [place]    Seasonal allergies     Past Surgical History:  Procedure Laterality Date   TONSILLECTOMY     Family History:  Family History  Problem Relation Age of Onset   Hypertension Mother    Family Psychiatric  History: No hx reported  Social History:  Social History   Substance and Sexual Activity  Alcohol Use Not Currently   Comment: rare     Social History   Substance and Sexual  Activity  Drug Use No    Social History   Socioeconomic History   Marital status: Single    Spouse name: Not on file   Number of children: Not on file   Years of education: Not on file   Highest education level: Not on file  Occupational History   Not on file  Tobacco Use   Smoking status: Every Day    Packs/day: 1.00    Types: Cigarettes   Smokeless tobacco: Former  Scientific laboratory technician Use: Never used  Substance and Sexual Activity   Alcohol use: Not Currently    Comment:  rare   Drug use: No   Sexual activity: Not Currently  Other Topics Concern   Not on file  Social History Narrative   Not on file   Social Determinants of Health   Financial Resource Strain: Not on file  Food Insecurity: Not on file  Transportation Needs: Not on file  Physical Activity: Not on file  Stress: Not on file  Social Connections: Not on file   Additional Social History:    Allergies:   Allergies  Allergen Reactions   Haldol [Haloperidol] Other (See Comments)    Muscle spasms, involuntary muscle contractions    Labs:  Results for orders placed or performed during the hospital encounter of 05/14/21 (from the past 48 hour(s))  Urinalysis, Routine w reflex microscopic     Status: Abnormal   Collection Time: 05/15/21  6:40 PM  Result Value Ref Range   Color, Urine YELLOW YELLOW   APPearance CLOUDY (A) CLEAR   Specific Gravity, Urine >1.030 (H) 1.005 - 1.030   pH 5.5 5.0 - 8.0   Glucose, UA NEGATIVE NEGATIVE mg/dL   Hgb urine dipstick TRACE (A) NEGATIVE   Bilirubin Urine NEGATIVE NEGATIVE   Ketones, ur 15 (A) NEGATIVE mg/dL   Protein, ur NEGATIVE NEGATIVE mg/dL   Nitrite NEGATIVE NEGATIVE   Leukocytes,Ua NEGATIVE NEGATIVE    Comment: Performed at [facility] [address] [place], [place] [address]  Urinalysis, Microscopic (reflex)     Status: Abnormal   Collection Time: 05/15/21  6:40 PM  Result Value Ref Range   RBC / HPF 0-5 0 - 5 RBC/hpf   WBC, UA 0-5 0 - 5 WBC/hpf   Bacteria, UA RARE (A) NONE SEEN   Squamous Epithelial / LPF NONE SEEN 0 - 5   Amorphous Crystal PRESENT     Comment: Performed at [facility], [place] [address] [place], [place] [address]    Medications:  Current Facility-Administered Medications  Medication Dose Route Frequency Provider Last Rate Last Admin   LORazepam (ATIVAN) tablet 2 mg  2 mg Oral TID Rankin, Shuvon B, NP   2 mg at 05/17/21 0845   Or   LORazepam (ATIVAN) injection 2 mg  2 mg Intramuscular TID Rankin,  Shuvon B, NP   2 mg at 05/16/21 2122   Current Outpatient Medications  Medication Sig Dispense Refill   ARIPiprazole (ABILIFY) 20 MG tablet Take 20 mg by mouth at bedtime. (Patient not taking: Reported on 05/14/2021)     nicotine polacrilex (NICORETTE) 2 MG gum Take 1 each (2 mg total) by mouth as needed for smoking cessation. (Patient not taking: Reported on 11/15/2019) 100 tablet 0   zolpidem (AMBIEN) 10 MG tablet Take 10 mg by mouth daily. (Patient not taking: Reported on 05/14/2021)      Psychiatric Specialty Exam:  Presentation  General Appearance: [place]  Speech:Slow  Speech Volume:Decreased  Handedness:Right   Mood and Affect  Mood:Dysphoric  Affect:Congruent   Thought Process  Thought Processes:Coherent  Descriptions of Associations:Intact  Orientation:Full (Time, Place and Person)  Thought Content:WDL  History of Schizophrenia/Schizoaffective disorder:Yes  Duration of Psychotic Symptoms:Greater than six months  Hallucinations:Hallucinations: None  Ideas of Reference:None  Suicidal Thoughts:Suicidal Thoughts: No  Homicidal Thoughts:Homicidal Thoughts: No   Sensorium  Memory:Immediate Fair; Recent Fair; Remote Fair  Judgment:Poor  Insight:Shallow   Executive Functions  Concentration:Fair  Attention Span:Fair  [place]   Psychomotor Activity  Psychomotor Activity:Psychomotor Activity: Decreased   Assets  Assets:Housing; Physical Health; Leisure Time; Social Support   Sleep  Sleep:Sleep: Fair Number of Hours of Sleep: 8    Physical Exam: Physical Exam Constitutional:      Appearance: Normal appearance.  Cardiovascular:     Rate and Rhythm: Normal rate.  Neurological:     Mental Status: He is alert.   Review of Systems  Constitutional: Negative.   HENT: Negative.    Eyes: Negative.   Respiratory: Negative.    Cardiovascular: Negative.    Gastrointestinal: Negative.   Genitourinary: Negative.   Musculoskeletal: Negative.   Skin: Negative.   Neurological: Negative.   Endo/Heme/Allergies: Negative.   Blood pressure 95/67, pulse 80, temperature 97.8 F (36.6 C), temperature source Oral, resp. rate 18, height 5\' 8"  (1.727 m), weight 63.5 kg, SpO2 99 %. Body mass index is 21.29 kg/m.  Treatment Plan Summary: Psychiatry CSW to find inpatient psychiatric facility.  Labs reviewed: EKG ordered for current QTC to restart antipsychotic medications.  Medications: Continue Ativan 2 mg TID for catatonia  Will initiate Risperdal 1 mg po am and 2 mg po QHS for schizoaffective dx   Disposition: Recommend psychiatric Inpatient admission when medically cleared.  This service was provided via telemedicine using a 2-way, interactive audio and video technology.  Names of all persons participating in this telemedicine service and their role in this encounter. Name: Lethea Killings  Role: Patient   Name:Olar Santini  Role: NP  Name:  Role:   Name:  Role:     Marissa Calamity, NP 05/17/2021 10:43 AM

## 2021-05-18 ENCOUNTER — Inpatient Hospital Stay
Admission: AD | Admit: 2021-05-18 | Discharge: 2021-05-23 | DRG: 885 | Disposition: A | Discharge status: Home / Self Care | Payer: 59 | Source: Intra-hospital | Attending: Psychiatry | Admitting: Psychiatry

## 2021-05-18 ENCOUNTER — Encounter: Payer: Self-pay | Admitting: Psychiatry

## 2021-05-18 ENCOUNTER — Other Ambulatory Visit: Payer: Self-pay

## 2021-05-18 DIAGNOSIS — Z91199 Patient's noncompliance with other medical treatment and regimen due to unspecified reason: Secondary | ICD-10-CM | POA: Diagnosis not present

## 2021-05-18 DIAGNOSIS — F201 Disorganized schizophrenia: Secondary | ICD-10-CM | POA: Diagnosis present

## 2021-05-18 DIAGNOSIS — Z20822 Contact with and (suspected) exposure to covid-19: Secondary | ICD-10-CM | POA: Diagnosis present

## 2021-05-18 DIAGNOSIS — F17210 Nicotine dependence, cigarettes, uncomplicated: Secondary | ICD-10-CM | POA: Diagnosis present

## 2021-05-18 DIAGNOSIS — F209 Schizophrenia, unspecified: Secondary | ICD-10-CM | POA: Diagnosis present

## 2021-05-18 DIAGNOSIS — E860 Dehydration: Secondary | ICD-10-CM | POA: Diagnosis present

## 2021-05-18 DIAGNOSIS — F203 Undifferentiated schizophrenia: Secondary | ICD-10-CM | POA: Diagnosis not present

## 2021-05-18 DIAGNOSIS — G4700 Insomnia, unspecified: Secondary | ICD-10-CM | POA: Diagnosis present

## 2021-05-18 MED ORDER — LORAZEPAM 1 MG PO TABS
1.0000 mg | ORAL_TABLET | ORAL | Status: AC | PRN
  Administered 2021-05-18: 1 mg via ORAL
  Filled 2021-05-18: qty 1

## 2021-05-18 MED ORDER — RISPERIDONE 1 MG PO TBDP: 2.0000 mg | ORAL_TABLET | Freq: Three times a day (TID) | ORAL | Status: DC | PRN

## 2021-05-18 MED ORDER — MAGNESIUM HYDROXIDE 400 MG/5ML PO SUSP: 30.0000 mL | Freq: Every day | ORAL | Status: DC | PRN

## 2021-05-18 MED ORDER — ARIPIPRAZOLE 10 MG PO TABS
20.0000 mg | ORAL_TABLET | Freq: Every day | ORAL | Status: DC
  Administered 2021-05-18 – 2021-05-23 (×6): 20 mg via ORAL
  Filled 2021-05-18 (×6): qty 2

## 2021-05-18 MED ORDER — ACETAMINOPHEN 325 MG PO TABS: 650.0000 mg | ORAL_TABLET | Freq: Four times a day (QID) | ORAL | Status: DC | PRN

## 2021-05-18 MED ORDER — ALUM & MAG HYDROXIDE-SIMETH 200-200-20 MG/5ML PO SUSP: 30.0000 mL | ORAL | Status: DC | PRN

## 2021-05-18 MED ORDER — ZIPRASIDONE MESYLATE 20 MG IM SOLR: 20.0000 mg | INTRAMUSCULAR | Status: DC | PRN

## 2021-05-18 MED ORDER — HYDROXYZINE HCL 50 MG PO TABS: 50.0000 mg | ORAL_TABLET | Freq: Three times a day (TID) | ORAL | Status: DC | PRN

## 2021-05-18 MED ORDER — ZOLPIDEM TARTRATE 5 MG PO TABS
10.0000 mg | ORAL_TABLET | Freq: Every day | ORAL | Status: DC
  Administered 2021-05-18: 10 mg via ORAL
  Filled 2021-05-18: qty 2

## 2021-05-18 MED ORDER — NICOTINE POLACRILEX 2 MG MT GUM
2.0000 mg | CHEWING_GUM | OROMUCOSAL | Status: DC | PRN
  Administered 2021-05-18 – 2021-05-23 (×13): 2 mg via ORAL
  Filled 2021-05-18 (×14): qty 1

## 2021-05-18 NOTE — H&P (Signed)
Psychiatric Admission Assessment Adult  Patient Identification: Brendan Hogan MRN:  [id number] Date of Evaluation:  05/18/2021 Chief Complaint:  Schizophrenia ([place]) [F20.9] Principal Diagnosis: Schizophrenia ([place]) Diagnosis:  Principal Problem:   Schizophrenia ([place]) Active Problems:   Dehydration  History of Present Illness: Patient seen and chart reviewed.  This is a 26 year old man with a history of psychotic disorder who was brought to the emergency room in [place] by family.  Patient had been refusing to eat or drink for several days prior to visiting the emergency room.  When he first came in there it sounds like he looked pretty weak and was not communicating.  He had been there for about 3 days before coming over here and is probably a little better now.  Patient still is very withdrawn and blunted.  Minimal eye contact.  Says that he does not understand why he came into the hospital.  He does admit that he had not been eating or drinking and cannot give anything like a reasonable explanation for that.  His father tells me that he had voiced some kind of delusion about why he could not eat but that it never made much sense.  Patient had not been aggressively trying to harm himself.  Family does report that he had been refusing medication which is standard for him.  They were concerned about substance abuse.  Patient gave a noncommittal answer but there is no evidence on his drug screen of any substance abuse.  Patient lives at home with family.  Does not work.  Has outpatient treatment set up but is usually noncompliant with it. Associated Signs/Symptoms: Depression Symptoms:  insomnia, psychomotor retardation, difficulty concentrating, Duration of Depression Symptoms: Less than two weeks  (Hypo) Manic Symptoms:  Impulsivity, Anxiety Symptoms:  Excessive Worry, Psychotic Symptoms:  Delusions, Paranoia, PTSD Symptoms: Negative Total Time spent with patient: 1 hour  Past  Psychiatric History: Patient has had prior hospitalizations and visits to the emergency room.  It looks like when he first presented a couple years ago he had a more manic like presentation but more recently it seems to have settled into a chronic schizophrenic like presentation.  He has been prescribed medications multiple times and has even been on long-acting Risperdal but has rarely been compliant.  Does not apparently have disability and is not apparently covered by any insurance.  No documented past suicide attempts.  Family says patient occasionally drinks alcohol but the patient says that he does not.  Is the patient at risk to self? Yes.    Has the patient been a risk to self in the past 6 months? Yes.    Has the patient been a risk to self within the distant past? Yes.    Is the patient a risk to others? No.  Has the patient been a risk to others in the past 6 months? No.  Has the patient been a risk to others within the distant past? No.   Prior Inpatient Therapy:   Prior Outpatient Therapy:    Alcohol Screening: Patient refused Alcohol Screening Tool: Yes 1. How often do you have a drink containing alcohol?: Monthly or less 2. How many drinks containing alcohol do you have on a typical day when you are drinking?: 3 or 4 3. How often do you have six or more drinks on one occasion?: Never AUDIT-C Score: 2 4. How often during the last year have you found that you were not able to stop drinking once you had started?:  Never 5. How often during the last year have you failed to do what was normally expected from you because of drinking?: Never 6. How often during the last year have you needed a first drink in the morning to get yourself going after a heavy drinking session?: Never 7. How often during the last year have you had a feeling of guilt of remorse after drinking?: Never 8. How often during the last year have you been unable to remember what happened the night before because you had  been drinking?: Never 9. Have you or someone else been injured as a result of your drinking?: No 10. Has a relative or friend or a doctor or another health worker been concerned about your drinking or suggested you cut down?: No Alcohol Use Disorder Identification Test Final Score (AUDIT): 2 Substance Abuse History in the last 12 months:  No. Consequences of Substance Abuse: Negative Previous Psychotropic Medications: Yes  Psychological Evaluations: Yes  Past Medical History:  Past Medical History:  Diagnosis Date   Psychosis ([facility]) 05/2019   [name]    Seasonal allergies     Past Surgical History:  Procedure Laterality Date   TONSILLECTOMY     Family History:  Family History  Problem Relation Age of Onset   Hypertension Mother    Family Psychiatric  History: None is reported Tobacco Screening:   Social History:  Social History   Substance and Sexual Activity  Alcohol Use Not Currently   Comment: rare     Social History   Substance and Sexual Activity  Drug Use No    Additional Social History:                           Allergies:   Allergies  Allergen Reactions   Haldol [Haloperidol] Other (See Comments)    Muscle spasms, involuntary muscle contractions   Lab Results:  Results for orders placed or performed during the hospital encounter of 05/14/21 (from the past 48 hour(s))  POC SARS Coronavirus 2 Ag-ED - Nasal Swab     Status: None   Collection Time: 05/17/21  4:23 PM  Result Value Ref Range   SARSCOV2ONAVIRUS 2 AG NEGATIVE NEGATIVE    Comment: (NOTE) SARS-CoV-2 antigen NOT DETECTED.   Negative results are presumptive.  Negative results do not preclude SARS-CoV-2 infection and should not be used as the sole basis for treatment or other patient management decisions, including infection  control decisions, particularly in the presence of clinical signs and  symptoms consistent with COVID-19, or in those who have been in contact  with the virus.  Negative results must be combined with clinical observations, patient history, and epidemiological information. The expected result is Negative.  Fact Sheet for Patients: https://www.jennings-kim.com/  Fact Sheet for Healthcare Providers: https://alexander-rogers.biz/  This test is not yet approved or cleared by the [place] FDA and  has been authorized for detection and/or diagnosis of SARS-CoV-2 by FDA under an Emergency Use Authorization (EUA).  This EUA will remain in effect (meaning this test can be used) for the duration of  the COV ID-19 declaration under Section 564(b)(1) of the Act, 21 U.S.C. section 360bbb-3(b)(1), unless the authorization is terminated or revoked sooner.      Blood Alcohol level:  Lab Results  Component Value Date   [facility] <10 05/14/2021   ETH <10 03/23/2021    Metabolic Disorder Labs:  Lab Results  Component Value Date  HGBA1C 5.0 09/08/2019   MPG 96.8 09/08/2019   Lab Results  Component Value Date   PROLACTIN 26.5 (H) 09/08/2019   Lab Results  Component Value Date   CHOL 159 09/08/2019   TRIG 68 09/08/2019   HDL 53 09/08/2019   CHOLHDL 3.0 09/08/2019   VLDL 14 09/08/2019   LDLCALC 92 09/08/2019    Current Medications: Current Facility-Administered Medications  Medication Dose Route Frequency Provider Last Rate Last Admin   acetaminophen (TYLENOL) tablet 650 mg  650 mg Oral Q6H PRN Jansen Goodpasture, [name], MD       alum & mag hydroxide-simeth (MAALOX/MYLANTA) 200-200-20 MG/5ML suspension 30 mL  30 mL Oral Q4H PRN Sonya Pucci, [name], MD       ARIPiprazole (ABILIFY) tablet 20 mg  20 mg Oral Daily Birdell Frasier T, MD   20 mg at 05/18/21 1357   hydrOXYzine (ATARAX) tablet 50 mg  50 mg Oral TID PRN Bethenny Losee, [name], MD       risperiDONE (RISPERDAL M-TABS) disintegrating tablet 2 mg  2 mg Oral Q8H PRN Kale Rondeau, [name], MD       And   LORazepam (ATIVAN) tablet 1 mg  1 mg Oral PRN Ezelle Surprenant, [name], MD        And   ziprasidone (GEODON) injection 20 mg  20 mg Intramuscular PRN Kyheem Bathgate, [name], MD       magnesium hydroxide (MILK OF MAGNESIA) suspension 30 mL  30 mL Oral Daily PRN Duana Benedict, [name], MD       nicotine polacrilex (NICORETTE) gum 2 mg  2 mg Oral PRN Kristelle Cavallaro, [name], MD   2 mg at 05/18/21 1357   zolpidem (AMBIEN) tablet 10 mg  10 mg Oral QHS Sinclair Arrazola T, MD       PTA Medications: Medications Prior to Admission  Medication Sig Dispense Refill Last Dose   ARIPiprazole (ABILIFY) 20 MG tablet Take 20 mg by mouth at bedtime. (Patient not taking: Reported on 05/14/2021)      nicotine polacrilex (NICORETTE) 2 MG gum Take 1 each (2 mg total) by mouth as needed for smoking cessation. (Patient not taking: Reported on 11/15/2019) 100 tablet 0    zolpidem (AMBIEN) 10 MG tablet Take 10 mg by mouth daily. (Patient not taking: Reported on 05/14/2021)       Musculoskeletal: Strength & Muscle Tone: within normal limits Gait & Station: normal Patient leans: N/A            Psychiatric Specialty Exam:  Presentation  General Appearance: Disheveled  Eye Contact:Minimal  Speech:Slow  Speech Volume:Decreased  Handedness:Right   Mood and Affect  Mood:Dysphoric  Affect:Congruent   Thought Process  Thought Processes:Coherent  Duration of Psychotic Symptoms: Greater than six months  Past Diagnosis of Schizophrenia or Psychoactive disorder: Yes  Descriptions of Associations:Intact  Orientation:Full (Time, Place and Person)  Thought Content:WDL  Hallucinations:Hallucinations: None  Ideas of Reference:None  Suicidal Thoughts:Suicidal Thoughts: No  Homicidal Thoughts:Homicidal Thoughts: No   Sensorium  Memory:Immediate Fair; Recent Fair; Remote Fair  Judgment:Poor  Insight:Shallow   Executive Functions  Concentration:Fair  Attention Span:Fair  [place]   Psychomotor Activity  Psychomotor Activity:Psychomotor  Activity: Decreased   Assets  Assets:Housing; Physical Health; Leisure Time; Social Support   Sleep  Sleep:Sleep: Fair Number of Hours of Sleep: 8    Physical Exam: Physical Exam Vitals and nursing note reviewed.  Constitutional:      Appearance: Normal appearance.    HENT:  Head: Normocephalic and atraumatic.     Mouth/Throat:     Pharynx: Oropharynx is clear.  Eyes:     Pupils: Pupils are equal, round, and reactive to light.  Cardiovascular:     Rate and Rhythm: Normal rate and regular rhythm.  Pulmonary:     Effort: Pulmonary effort is normal.     Breath sounds: Normal breath sounds.  Abdominal:     General: Abdomen is flat.     Palpations: Abdomen is soft.  Musculoskeletal:        General: Normal range of motion.  Skin:    General: Skin is warm and dry.  Neurological:     General: No focal deficit present.     Mental Status: He is alert. Mental status is at baseline.  Psychiatric:        Attention and Perception: He is inattentive.        Mood and Affect: Mood normal. Affect is blunt.        Speech: He is noncommunicative. Speech is delayed.        Behavior: Behavior is slowed.        Thought Content: Thought content is paranoid and delusional.        Cognition and Memory: Cognition is impaired.        Judgment: Judgment is inappropriate.   Review of Systems  Constitutional: Negative.   HENT: Negative.    Eyes: Negative.   Respiratory: Negative.    Cardiovascular: Negative.   Gastrointestinal: Negative.   Musculoskeletal: Negative.   Skin: Negative.   Neurological: Negative.   Psychiatric/Behavioral:  Negative for depression, hallucinations, substance abuse and suicidal ideas. The patient is nervous/anxious and has insomnia.   Blood pressure 111/75, pulse 86, temperature 98.1 F (36.7 C), temperature source Oral, resp. rate 17, height 5' 6.93" (1.7 m), weight 59 kg, SpO2 100 %. Body mass index is 20.4 kg/m.  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  Not aggressive or violent.  He is able to articulate a desire to keep taking his Ambien at night and also a wish for nicotine gum.  He was at least tacitly agreeable to restarting and continuing Abilify which is his most recent outpatient medicine.  I spoke with his father and expressed that the plan would be to try to get him on long-acting Abilify injections as this is most likely to keep him well longer outside the hospital.  Continue 15-minute checks.  Labs will be completed.  Engage in individual and group assessment.  Observation Level/Precautions:  15 minute checks  Laboratory:  UDS  Psychotherapy:    Medications:    Consultations:    Discharge Concerns:    Estimated LOS:  Other:     Physician Treatment Plan for Primary Diagnosis: Schizophrenia ([place]) Long Term Goal(s): Improvement in symptoms so as ready for discharge  Short Term Goals: Ability to verbalize feelings will improve, Ability to identify and develop effective coping behaviors will improve, and Compliance with prescribed medications will improve  Physician Treatment Plan for Secondary Diagnosis: Principal Problem:   Schizophrenia ([place]) Active Problems:   Dehydration  Long Term Goal(s): Improvement in symptoms so as ready for discharge  Short Term Goals: Compliance with prescribed medications will improve  I certify that inpatient services furnished can reasonably be expected to improve  the patient's condition.    Alethia Berthold, MD 1/20/20232:28 PM

## 2021-05-18 NOTE — Group Note (Signed)
LCSW Group Therapy Note  Group Date: 05/18/2021 Start Time: 1400 End Time: 1500   Type of Therapy and Topic:  Group Therapy - How To Cope with Nervousness about Discharge   Participation Level:  Did Not Attend   Description of Group This process group involved identification of patients' feelings about discharge. Some of them are scheduled to be discharged soon, while others are new admissions, but each of them was asked to share thoughts and feelings surrounding discharge from the hospital. One common theme was that they are excited at the prospect of going home, while another was that many of them are apprehensive about sharing why they were hospitalized. Patients were given the opportunity to discuss these feelings with their peers in preparation for discharge.  Therapeutic Goals  Patient will identify their overall feelings about pending discharge. Patient will think about how they might proactively address issues that they believe will once again arise once they get home (i.e. with parents). Patients will participate in discussion about having hope for change.   Summary of Patient Progress:   X  Therapeutic Modalities Cognitive Behavioral Therapy   Harden Mo, LCSWA 05/18/2021  3:21 PM

## 2021-05-18 NOTE — Tx Team (Signed)
Initial Treatment Plan 05/18/2021 1:36 PM JAQUAIL MCLEES [id number]    PATIENT STRESSORS: Other: Patient states "I don't know" when asked about stressors     PATIENT STRENGTHS: Supportive family/friends    PATIENT IDENTIFIED PROBLEMS: Anxiety and difficulty sleeping                     DISCHARGE CRITERIA:  Reduction of life-threatening or endangering symptoms to within safe limits  PRELIMINARY DISCHARGE PLAN: Return to previous living arrangement  PATIENT/FAMILY INVOLVEMENT: This treatment plan has been presented to and reviewed with the patient, [name].  The patient and family have been given the opportunity to ask questions and make suggestions.  Hyman Hopes, RN 05/18/2021, 1:36 PM

## 2021-05-18 NOTE — Progress Notes (Signed)
Patient ID: Brendan Hogan, male   DOB: 1995/11/02, 26 y.o.   MRN: [id number]  Admission note: Patient is a 26 year old male, presents IVC per report of schizophrenia. Patient is alert and oriented to unit. . Patient is cooperative during assessment questions. Patient presents with a scar on the right knee. Patient denies pain.  Patient denies stressors related to admission at this time. Patient presents calm with brief eye contact. Minimal interaction with patient during assessment. Patient was vague with answering assessment questions stating "I don't know" for the questions being asked. Patients guardian notified by Probation officer of patients admission on the unit.  Patient currently denies SI/HI/AVH. Unit policies explained and verbalized understanding. Q15 minute checks maintained and will continue to monitor.

## 2021-05-18 NOTE — ED Notes (Signed)
Report to RN at [facility].  Pt assigned to room 304

## 2021-05-18 NOTE — BHH Suicide Risk Assessment (Signed)
[facility] Admission Suicide Risk Assessment   Nursing information obtained from:  Patient Demographic factors:  Male Current Mental Status:  NA Loss Factors:  NA Historical Factors:  NA Risk Reduction Factors:  NA  Total Time spent with patient: 1 hour Principal Problem: Schizophrenia ([place]) Diagnosis:  Principal Problem:   Schizophrenia ([place]) Active Problems:   Dehydration  Subjective Data: Patient seen and chart reviewed.  This is a 26 year old man with a history of schizophrenia who was referred to [place] from [place].  He had presented to the emergency room there with decompensation and what at first seemed like they could be catatonic like symptoms.  Had been refusing to eat or drink for several days.  Patient denies suicidal ideation.  Has not made any attempt or indication of harming himself or harming anyone else.  Very passive but basically cooperative right now.  Continued Clinical Symptoms:  Alcohol Use Disorder Identification Test Final Score (AUDIT): 2 The "Alcohol Use Disorders Identification Test", Guidelines for Use in Primary Care, Second Edition.  World Pharmacologist [facility]). Score between 0-7:  no or low risk or alcohol related problems. Score between 8-15:  moderate risk of alcohol related problems. Score between 16-19:  high risk of alcohol related problems. Score 20 or above:  warrants further diagnostic evaluation for alcohol dependence and treatment.   CLINICAL FACTORS:   Schizophrenia:   Less than 38 years old Paranoid or undifferentiated type   Musculoskeletal: Strength & Muscle Tone: within normal limits Gait & Station: normal Patient leans: N/A  Psychiatric Specialty Exam:  Presentation  General Appearance: Disheveled  Eye Contact:Minimal  Speech:Slow  Speech Volume:Decreased  Handedness:Right   Mood and Affect  Mood:Dysphoric  Affect:Congruent   Thought Process  Thought Processes:Coherent  Descriptions of  Associations:Intact  Orientation:Full (Time, Place and Person)  Thought Content:WDL  History of Schizophrenia/Schizoaffective disorder:Yes  Duration of Psychotic Symptoms:Greater than six months  Hallucinations:Hallucinations: None  Ideas of Reference:None  Suicidal Thoughts:Suicidal Thoughts: No  Homicidal Thoughts:Homicidal Thoughts: No   Sensorium  Memory:Immediate Fair; Recent Fair; Remote Fair  Judgment:Poor  Insight:Shallow   Executive Functions  Concentration:Fair  Attention Span:Fair  [place]   Psychomotor Activity  Psychomotor Activity:Psychomotor Activity: Decreased   Assets  Assets:Housing; Physical Health; Leisure Time; Social Support   Sleep  Sleep:Sleep: Fair Number of Hours of Sleep: 8    Physical Exam: Physical Exam Vitals and nursing note reviewed.  Constitutional:      Appearance: Normal appearance.    HENT:     Head: Normocephalic and atraumatic.     Mouth/Throat:     Pharynx: Oropharynx is clear.  Eyes:     Pupils: Pupils are equal, round, and reactive to light.  Cardiovascular:     Rate and Rhythm: Normal rate and regular rhythm.  Pulmonary:     Effort: Pulmonary effort is normal.     Breath sounds: Normal breath sounds.  Abdominal:     General: Abdomen is flat.     Palpations: Abdomen is soft.  Musculoskeletal:        General: Normal range of motion.  Skin:    General: Skin is warm and dry.  Neurological:     General: No focal deficit present.     Mental Status: He is alert. Mental status is at baseline.  Psychiatric:        Attention and Perception: He is inattentive.        Mood and Affect: Mood normal. Affect is blunt.  Speech: Speech is delayed and tangential.        Thought Content: Thought content is paranoid and delusional. Thought content does not include homicidal or suicidal ideation.        Cognition and Memory: Cognition is impaired. Memory is  impaired.   Review of Systems  Constitutional: Negative.   HENT: Negative.    Eyes: Negative.   Respiratory: Negative.    Cardiovascular: Negative.   Gastrointestinal: Negative.   Musculoskeletal: Negative.   Skin: Negative.   Neurological: Negative.   Psychiatric/Behavioral:  Negative for depression, hallucinations, substance abuse and suicidal ideas. The patient is nervous/anxious and has insomnia.   Blood pressure 111/75, pulse 86, temperature 98.1 F (36.7 C), temperature source Oral, resp. rate 17, height 5' 6.93" (1.7 m), weight 59 kg, SpO2 100 %. Body mass index is 20.4 kg/m.   COGNITIVE FEATURES THAT CONTRIBUTE TO RISK:  Thought constriction (tunnel vision)    SUICIDE RISK:   Mild:  Suicidal ideation of limited frequency, intensity, duration, and specificity.  There are no identifiable plans, no associated intent, mild dysphoria and related symptoms, good self-control (both objective and subjective assessment), few other risk factors, and identifiable protective factors, including available and accessible social support.  PLAN OF CARE: Continue 15-minute checks.  Daily reassessment of mood and behavior.  Engage in individual and group therapy and engage in appropriate medication treatment.  Arrange for appropriate disposition and follow-up when he is doing better.  I certify that inpatient services furnished can reasonably be expected to improve the patient's condition.   Alethia Berthold, MD 05/18/2021, 2:24 PM

## 2021-05-18 NOTE — ED Notes (Signed)
Called [place] [facility] and left voicemail with Production assistant, radio.

## 2021-05-18 NOTE — ED Notes (Signed)
Attempted to call report, asked to call back in 30 mins

## 2021-05-19 ENCOUNTER — Encounter: Payer: Self-pay | Admitting: Psychiatry

## 2021-05-19 LAB — LIPID PANEL
Cholesterol: 106 mg/dL (ref 0–200)
HDL: 30 mg/dL — ABNORMAL LOW (ref >40)
LDL Cholesterol: 62 mg/dL (ref 0–99)
Total CHOL/HDL Ratio: 3.5 RATIO
Triglycerides: 68 mg/dL (ref <150)
VLDL: 14 mg/dL (ref 0–40)

## 2021-05-19 MED ORDER — ZOLPIDEM TARTRATE 5 MG PO TABS
10.0000 mg | ORAL_TABLET | Freq: Every evening | ORAL | Status: DC | PRN
  Administered 2021-05-19 – 2021-05-22 (×4): 10 mg via ORAL
  Filled 2021-05-19 (×4): qty 2

## 2021-05-19 MED ORDER — MIRTAZAPINE 15 MG PO TABS
15.0000 mg | ORAL_TABLET | Freq: Every day | ORAL | Status: DC
  Administered 2021-05-19 – 2021-05-22 (×4): 15 mg via ORAL
  Filled 2021-05-19 (×4): qty 1

## 2021-05-19 NOTE — Progress Notes (Signed)
[facility] MD Progress Note  05/19/2021 11:48 AM Brendan Hogan  MRN:  [id number] Subjective: Patient is a 26 year old male who presented with a history of schizophrenia and psychosis.  His chief complaint today is difficulty sleeping.  He is currently on Ambien and states is not helping.  I talked to him about Remeron and he would like to give it a try.  He currently denies any psychosis or suicidal ideation.  Principal Problem: Schizophrenia ([place]) Diagnosis: Principal Problem:   Schizophrenia ([place]) Active Problems:   Dehydration  Total Time spent with patient: 15 minutes  Past Psychiatric History: Yes  Past Medical History:  Past Medical History:  Diagnosis Date   Psychosis ([place]) 05/2019   [place]    Seasonal allergies     Past Surgical History:  Procedure Laterality Date   TONSILLECTOMY     Family History:  Family History  Problem Relation Age of Onset   Hypertension Mother     Social History:  Social History   Substance and Sexual Activity  Alcohol Use Not Currently   Comment: rare     Social History   Substance and Sexual Activity  Drug Use No    Social History   Socioeconomic History   Marital status: Single    Spouse name: Not on file   Number of children: Not on file   Years of education: Not on file   Highest education level: Not on file  Occupational History   Not on file  Tobacco Use   Smoking status: Every Day    Packs/day: 1.00    Types: Cigarettes   Smokeless tobacco: Former  Scientific laboratory technician Use: Never used  Substance and Sexual Activity   Alcohol use: Not Currently    Comment: rare   Drug use: No   Sexual activity: Not Currently  Other Topics Concern   Not on file  Social History Narrative   Not on file   Social Determinants of Health   Financial Resource Strain: Not on file  Food Insecurity: Not on file  Transportation Needs: Not on file  Physical Activity: Not on file  Stress: Not on file  Social  Connections: Not on file   Additional Social History:                         Sleep: Poor  Appetite:  Good  Current Medications: Current Facility-Administered Medications  Medication Dose Route Frequency Provider Last Rate Last Admin   acetaminophen (TYLENOL) tablet 650 mg  650 mg Oral Q6H PRN Clapacs, [name], MD       alum & mag hydroxide-simeth (MAALOX/MYLANTA) 200-200-20 MG/5ML suspension 30 mL  30 mL Oral Q4H PRN Clapacs, [name], MD       ARIPiprazole (ABILIFY) tablet 20 mg  20 mg Oral Daily Clapacs, [name], MD   20 mg at 05/19/21 [id number]   hydrOXYzine (ATARAX) tablet 50 mg  50 mg Oral TID PRN Clapacs, [name], MD       magnesium hydroxide (MILK OF MAGNESIA) suspension 30 mL  30 mL Oral Daily PRN Clapacs, [name], MD       mirtazapine (REMERON) tablet 15 mg  15 mg Oral QHS Rebekha Diveley Edward, DO       nicotine polacrilex (NICORETTE) gum 2 mg  2 mg Oral PRN Clapacs, [name], MD   2 mg at 05/18/21 2245   risperiDONE (RISPERDAL M-TABS) disintegrating tablet 2 mg  2 mg Oral Q8H PRN Clapacs, [name], MD       And   ziprasidone (GEODON) injection 20 mg  20 mg Intramuscular PRN Clapacs, [name], MD       zolpidem (AMBIEN) tablet 10 mg  10 mg Oral QHS PRN [name], DO        Lab Results:  Results for orders placed or performed during the hospital encounter of 05/18/21 (from the past 48 hour(s))  Lipid panel     Status: Abnormal   Collection Time: 05/19/21 10:23 AM  Result Value Ref Range   Cholesterol 106 0 - 200 mg/dL   Triglycerides 68 <150 mg/dL   HDL 30 (L) >40 mg/dL   Total CHOL/HDL Ratio 3.5 RATIO   VLDL 14 0 - 40 mg/dL   LDL Cholesterol 62 0 - 99 mg/dL    Comment:        Total Cholesterol/HDL:CHD Risk Coronary Heart Disease Risk Table                     Men   Women  1/2 Average Risk   3.4   3.3  Average Risk       5.0   4.4  2 X Average Risk   9.6   7.1  3 X Average Risk  23.4   11.0        Use the calculated Patient Ratio above and the CHD Risk  Table to determine the patient's CHD Risk.        ATP III CLASSIFICATION (LDL):  <100     mg/dL   Optimal  100-129  mg/dL   Near or Above                    Optimal  130-159  mg/dL   Borderline  160-189  mg/dL   High  >190     mg/dL   Very High Performed at [facility], [address] [place], [place] [address]     Blood Alcohol level:  Lab Results  Component Value Date   [facility] <10 05/14/2021   ETH <10 [id number]    Metabolic Disorder Labs: Lab Results  Component Value Date   HGBA1C 5.0 09/08/2019   MPG 96.8 09/08/2019   Lab Results  Component Value Date   PROLACTIN 26.5 (H) 09/08/2019   Lab Results  Component Value Date   CHOL 106 05/19/2021   TRIG 68 05/19/2021   HDL 30 (L) 05/19/2021   CHOLHDL 3.5 05/19/2021   VLDL 14 05/19/2021   LDLCALC 62 05/19/2021   LDLCALC 92 09/08/2019    Physical Findings: AIMS:  , ,  ,  ,    CIWA:    COWS:     Musculoskeletal: Strength & Muscle Tone: within normal limits Gait & Station: normal Patient leans: N/A  Psychiatric Specialty Exam:  Presentation  General Appearance: Disheveled  Eye Contact:Minimal  Speech:Slow  Speech Volume:Decreased  Handedness:Right   Mood and Affect  Mood:Dysphoric  Affect:Congruent   Thought Process  Thought Processes:Coherent  Descriptions of Associations:Intact  Orientation:Full (Time, Place and Person)  Thought Content:WDL  History of Schizophrenia/Schizoaffective disorder:Yes  Duration of Psychotic Symptoms:Greater than six months  Hallucinations:No data recorded Ideas of Reference:None  Suicidal Thoughts:No data recorded Homicidal Thoughts:No data recorded  Sensorium  Memory:Immediate Fair; Recent Fair; Remote Fair  Judgment:Poor  Insight:Shallow   Executive Functions  Concentration:Fair  Attention Span:Fair  [place]  Activity  Psychomotor Activity:No data recorded  Assets   Assets:Housing; Physical Health; Leisure Time; Social Support   Sleep  Sleep:No data recorded   Physical Exam: Physical Exam Vitals and nursing note reviewed.  Constitutional:      Appearance: Normal appearance. He is normal weight.  Neurological:     General: No focal deficit present.     Mental Status: He is alert and oriented to person, place, and time.  Psychiatric:        Attention and Perception: He perceives auditory hallucinations.        Mood and Affect: Mood normal. Affect is flat.        Speech: Speech normal.        Behavior: Behavior normal. Behavior is cooperative.        Thought Content: Thought content is paranoid.        Cognition and Memory: Cognition and memory normal.        Judgment: Judgment is impulsive.   Review of Systems  Constitutional: Negative.   HENT: Negative.    Eyes: Negative.   Respiratory: Negative.    Cardiovascular: Negative.   Gastrointestinal: Negative.   Genitourinary: Negative.   Musculoskeletal: Negative.   Skin: Negative.   Neurological: Negative.   Endo/Heme/Allergies: Negative.   Psychiatric/Behavioral:  Positive for depression and hallucinations. The patient has insomnia.   Blood pressure 100/68, pulse 93, temperature 97.8 F (36.6 C), temperature source Oral, resp. rate 17, height 5' 6.93" (1.7 m), weight 59 kg, SpO2 100 %. Body mass index is 20.4 kg/m.   Treatment Plan Summary: Daily contact with patient to assess and evaluate symptoms and progress in treatment, Medication management, and Plan change Ambien to as needed and start Remeron 15 mg at bedtime.  Continue his other current medications.  [name], DO 05/19/2021, 11:48 AM

## 2021-05-19 NOTE — BHH Suicide Risk Assessment (Signed)
[facility] INPATIENT:  Family/Significant Other Suicide Prevention Education  Suicide Prevention Education:  Education Completed; Brendan Hogan (patient's legal guardian/father) [phone], has been identified by the patient as the family member/significant other with whom the patient will be residing, and identified as the person(s) who will aid the patient in the event of a mental health crisis (suicidal ideations/suicide attempt).  With written consent from the patient, the family member/significant other has been provided the following suicide prevention education, prior to the and/or following the discharge of the patient.  The suicide prevention education provided includes the following: Suicide risk factors Suicide prevention and interventions National Suicide Hotline telephone number [facility] assessment telephone number [facility] Emergency Assistance [facility] and/or Residential Mobile Crisis Unit telephone number  Request made of family/significant other to: Remove weapons (e.g., guns, rifles, knives), all items previously/currently identified as safety concern.   Remove drugs/medications (over-the-counter, prescriptions, illicit drugs), all items previously/currently identified as a safety concern.  The family member/significant other verbalizes understanding of the suicide prevention education information provided.  The family member/significant other agrees to remove the items of safety concern listed above.  Patient's guardian/father states patient has recently expressed vague suicidal ideation without a plan, voicing "If I had someone to kill me." Patient's father states patient has no history of suicide attempts but did intentionally harm himself a couple months ago by picking up a loveseat and dropping it on his fingers and feet. Patient's father states patient has ongoing non-compliance with medication with the exception of his sleeping medication, which  patient has taken more than prescribed in the past. Patient has also taken patient's fathers non-narcotic medication more than prescribed 2 years ago. Patient's father states he now keeps all medication in a secure location in the home and administers medication to patient to avoid misuse.   Brendan Hogan 05/19/2021, 5:10 PM

## 2021-05-19 NOTE — Progress Notes (Signed)
Patient denies SI, HI and AVH. He said that the reason why he had stopped eating prior to coming in was because he saw a bumper sticker on the back of a car that said eating animals was wrong and he had decided to be vegan and not eat meat anymore, but he did not realize vegans cannot eat butter, cheese, eggs or milk products. Complained of anxiety and inability to sleep after getting ambien and waiting for an hour so he received Ativan with good results

## 2021-05-19 NOTE — Plan of Care (Signed)
  Problem: Education: Goal: Knowledge of [facility] Education information/materials will improve Outcome: Progressing Goal: Emotional status will improve Outcome: Progressing Goal: Mental status will improve Outcome: Progressing Goal: Verbalization of understanding the information provided will improve Outcome: Progressing   Problem: Activity: Goal: Interest or engagement in activities will improve Outcome: Progressing Goal: Sleeping patterns will improve Outcome: Progressing   Problem: Coping: Goal: Ability to verbalize frustrations and anger appropriately will improve Outcome: Progressing Goal: Ability to demonstrate self-control will improve Outcome: Progressing   Problem: Health Behavior/Discharge Planning: Goal: Identification of resources available to assist in meeting health care needs will improve Outcome: Progressing Goal: Compliance with treatment plan for underlying cause of condition will improve Outcome: Progressing   Problem: Physical Regulation: Goal: Ability to maintain clinical measurements within normal limits will improve Outcome: Progressing   Problem: Safety: Goal: Periods of time without injury will increase Outcome: Progressing   

## 2021-05-19 NOTE — Progress Notes (Signed)
Patient denies pain. Patient denies anxiety and depression. Patient denies SI/HI/AVH. Patient seen in the dayroom watching television.  Patient compliant with medication administration.  Q15 minute safety checks maintained. Patient remains safe on the unit at this time.
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
## 2021-05-19 NOTE — Group Note (Signed)
LCSW Group Therapy Note ° °Group Date: 05/19/2021 °Start Time: 1315 °End Time: 1415 ° ° °Type of Therapy and Topic:  Group Therapy - Healthy vs Unhealthy Coping Skills ° °Participation Level:  Did Not Attend  ° °Description of Group °The focus of this group was to determine what unhealthy coping techniques typically are used by group members and what healthy coping techniques would be helpful in coping with various problems. Patients were guided in becoming aware of the differences between healthy and unhealthy coping techniques. Patients were asked to identify 2-3 healthy coping skills they would like to learn to use more effectively. ° °Therapeutic Goals °Patients learned that coping is what human beings do all day long to deal with various situations in their lives °Patients defined and discussed healthy vs unhealthy coping techniques °Patients identified their preferred coping techniques and identified whether these were healthy or unhealthy °Patients determined 2-3 healthy coping skills they would like to become more familiar with and use more often. °Patients provided support and ideas to each other ° ° °Summary of Patient Progress: Patient did not attend group despite encouraged participation. ° ° °Therapeutic Modalities °Cognitive Behavioral Therapy °Motivational Interviewing ° °Jory Tanguma K Florabel Faulks, LCSWA °05/19/2021  4:06 PM   °

## 2021-05-20 NOTE — Group Note (Signed)
LCSW Group Therapy Note  Group Date: 05/20/2021 Start Time: 1300 End Time: 1345   Type of Therapy and Topic:  Group Therapy - How To Cope with Nervousness about Discharge   Participation Level:  Did Not Attend   Description of Group This process group involved identification of patients' feelings about discharge. Some of them are scheduled to be discharged soon, while others are new admissions, but each of them was asked to share thoughts and feelings surrounding discharge from the hospital. One common theme was that they are excited at the prospect of going home, while another was that many of them are apprehensive about sharing why they were hospitalized. Patients were given the opportunity to discuss these feelings with their peers in preparation for discharge.  Therapeutic Goals  Patient will identify their overall feelings about pending discharge. Patient will think about how they might proactively address issues that they believe will once again arise once they get home (i.e. with parents). Patients will participate in discussion about having hope for change.   Summary of Patient Progress: Patient did not attend group despite encouraged participation.    Therapeutic Modalities Cognitive Behavioral Therapy   Sherilyn Dacosta 05/20/2021  2:55 PM

## 2021-05-20 NOTE — Plan of Care (Signed)
  Problem: Education: Goal: Knowledge of [place] General Education information/materials will improve Outcome: Progressing Goal: Emotional status will improve Outcome: Progressing Goal: Mental status will improve Outcome: Progressing Goal: Verbalization of understanding the information provided will improve Outcome: Progressing   Problem: Activity: Goal: Interest or engagement in activities will improve Outcome: Progressing Goal: Sleeping patterns will improve Outcome: Progressing   Problem: Coping: Goal: Ability to verbalize frustrations and anger appropriately will improve Outcome: Progressing Goal: Ability to demonstrate self-control will improve Outcome: Progressing   Problem: Health Behavior/Discharge Planning: Goal: Identification of resources available to assist in meeting health care needs will improve Outcome: Progressing Goal: Compliance with treatment plan for underlying cause of condition will improve Outcome: Progressing   Problem: Physical Regulation: Goal: Ability to maintain clinical measurements within normal limits will improve Outcome: Progressing   Problem: Safety: Goal: Periods of time without injury will increase Outcome: Progressing   

## 2021-05-20 NOTE — Progress Notes (Signed)
[facility] MD Progress Note  05/20/2021 1:00 PM Brendan Hogan  MRN:  [id number] Subjective: Patient is very vague in his answers.  I started him on Remeron last night and he cannot really tell me whether it helped or not.  His affect is still flat.  He has very little insight as to why he is here.  He asked to go home.  He denies any side effects from his medications.  Principal Problem: Schizophrenia ([place]) Diagnosis: Principal Problem:   Schizophrenia ([place]) Active Problems:   Dehydration  Total Time spent with patient: 15 minutes  Past Psychiatric History: Yes  Past Medical History:  Past Medical History:  Diagnosis Date   Psychosis ([place]) 05/2019   [place]    Seasonal allergies     Past Surgical History:  Procedure Laterality Date   TONSILLECTOMY     Family History:  Family History  Problem Relation Age of Onset   Hypertension Mother     Social History:  Social History   Substance and Sexual Activity  Alcohol Use Not Currently   Comment: rare     Social History   Substance and Sexual Activity  Drug Use No    Social History   Socioeconomic History   Marital status: Single    Spouse name: Not on file   Number of children: Not on file   Years of education: Not on file   Highest education level: Not on file  Occupational History   Not on file  Tobacco Use   Smoking status: Every Day    Packs/day: 1.00    Types: Cigarettes   Smokeless tobacco: Former  Scientific laboratory technician Use: Never used  Substance and Sexual Activity   Alcohol use: Not Currently    Comment: rare   Drug use: No   Sexual activity: Not Currently  Other Topics Concern   Not on file  Social History Narrative   Not on file   Social Determinants of Health   Financial Resource Strain: Not on file  Food Insecurity: Not on file  Transportation Needs: Not on file  Physical Activity: Not on file  Stress: Not on file  Social Connections: Not on file   Additional Social  History:                         Sleep: Fair  Appetite:  Good  Current Medications: Current Facility-Administered Medications  Medication Dose Route Frequency Provider Last Rate Last Admin   acetaminophen (TYLENOL) tablet 650 mg  650 mg Oral Q6H PRN Clapacs, [name], MD       alum & mag hydroxide-simeth (MAALOX/MYLANTA) 200-200-20 MG/5ML suspension 30 mL  30 mL Oral Q4H PRN Clapacs, [name], MD       ARIPiprazole (ABILIFY) tablet 20 mg  20 mg Oral Daily Clapacs, [name], MD   20 mg at 05/20/21 0758   hydrOXYzine (ATARAX) tablet 50 mg  50 mg Oral TID PRN Clapacs, [name], MD       magnesium hydroxide (MILK OF MAGNESIA) suspension 30 mL  30 mL Oral Daily PRN Clapacs, [name], MD       mirtazapine (REMERON) tablet 15 mg  15 mg Oral QHS [name], DO   15 mg at 05/19/21 2122   nicotine polacrilex (NICORETTE) gum 2 mg  2 mg Oral PRN Clapacs, [name], MD   2 mg at 05/19/21 1659   risperiDONE (RISPERDAL M-TABS)  disintegrating tablet 2 mg  2 mg Oral Q8H PRN Clapacs, [name], MD       And   ziprasidone (GEODON) injection 20 mg  20 mg Intramuscular PRN Clapacs, [name], MD       zolpidem (AMBIEN) tablet 10 mg  10 mg Oral QHS PRN [name], DO   10 mg at 05/19/21 2145    Lab Results:  Results for orders placed or performed during the hospital encounter of 05/18/21 (from the past 48 hour(s))  Lipid panel     Status: Abnormal   Collection Time: 05/19/21 10:23 AM  Result Value Ref Range   Cholesterol 106 0 - 200 mg/dL   Triglycerides 68 <150 mg/dL   HDL 30 (L) >40 mg/dL   Total CHOL/HDL Ratio 3.5 RATIO   VLDL 14 0 - 40 mg/dL   LDL Cholesterol 62 0 - 99 mg/dL    Comment:        Total Cholesterol/HDL:CHD Risk Coronary Heart Disease Risk Table                     Men   Women  1/2 Average Risk   3.4   3.3  Average Risk       5.0   4.4  2 X Average Risk   9.6   7.1  3 X Average Risk  23.4   11.0        Use the calculated Patient Ratio above and the CHD Risk  Table to determine the patient's CHD Risk.        ATP III CLASSIFICATION (LDL):  <100     mg/dL   Optimal  100-129  mg/dL   Near or Above                    Optimal  130-159  mg/dL   Borderline  160-189  mg/dL   High  >190     mg/dL   Very High Performed at [facility], [address] [place], [place] [address]     Blood Alcohol level:  Lab Results  Component Value Date   [facility] <10 05/14/2021   ETH <10 [id number]    Metabolic Disorder Labs: Lab Results  Component Value Date   HGBA1C 5.0 09/08/2019   MPG 96.8 09/08/2019   Lab Results  Component Value Date   PROLACTIN 26.5 (H) 09/08/2019   Lab Results  Component Value Date   CHOL 106 05/19/2021   TRIG 68 05/19/2021   HDL 30 (L) 05/19/2021   CHOLHDL 3.5 05/19/2021   VLDL 14 05/19/2021   LDLCALC 62 05/19/2021   LDLCALC 92 09/08/2019    Physical Findings: AIMS:  , ,  ,  ,    CIWA:    COWS:     Musculoskeletal: Strength & Muscle Tone: within normal limits Gait & Station: normal Patient leans: N/A  Psychiatric Specialty Exam:  Presentation  General Appearance: Disheveled  Eye Contact:Minimal  Speech:Slow  Speech Volume:Decreased  Handedness:Right   Mood and Affect  Mood:Dysphoric  Affect:Congruent   Thought Process  Thought Processes:Coherent  Descriptions of Associations:Intact  Orientation:Full (Time, Place and Person)  Thought Content:WDL  History of Schizophrenia/Schizoaffective disorder:Yes  Duration of Psychotic Symptoms:Greater than six months  Hallucinations:No data recorded Ideas of Reference:None  Suicidal Thoughts:No data recorded Homicidal Thoughts:No data recorded  Sensorium  Memory:Immediate Fair; Recent Fair; Remote Fair  Judgment:Poor  Insight:Shallow   Executive Functions  Concentration:Fair  Attention Span:Fair  Recall:Fair  Fund of Knowledge:Fair  Language:Fair   Psychomotor Activity  Psychomotor Activity:No data recorded  Assets   Assets:Housing; Physical Health; Leisure Time; Social Support   Sleep  Sleep:No data recorded   Physical Exam: Physical Exam Vitals and nursing note reviewed.  Constitutional:      Appearance: Normal appearance. He is normal weight.  Neurological:     General: No focal deficit present.     Mental Status: He is alert and oriented to person, place, and time.  Psychiatric:        Attention and Perception: Attention and perception normal.        Mood and Affect: Mood is anxious. Affect is labile.        Speech: He is noncommunicative. Speech is tangential.        Behavior: Behavior normal. Behavior is cooperative.        Thought Content: Thought content is paranoid.        Cognition and Memory: Cognition and memory normal.        Judgment: Judgment normal.   Review of Systems  Constitutional: Negative.   HENT: Negative.    Eyes: Negative.   Respiratory: Negative.    Cardiovascular: Negative.   Gastrointestinal: Negative.   Genitourinary: Negative.   Musculoskeletal: Negative.   Skin: Negative.   Neurological: Negative.   Endo/Heme/Allergies: Negative.   Psychiatric/Behavioral:  The patient has insomnia.   Blood pressure 102/68, pulse 81, temperature (!) 97.5 F (36.4 C), temperature source Oral, resp. rate 17, height 5' 6.93" (1.7 m), weight 59 kg, SpO2 96 %. Body mass index is 20.4 kg/m.   Treatment Plan Summary: Daily contact with patient to assess and evaluate symptoms and progress in treatment, Medication management, and Plan continue current medications.  [place], DO 05/20/2021, 1:00 PM

## 2021-05-20 NOTE — Progress Notes (Signed)
Patient denies pain. Patient denies anxiety and depression. Patient denies SI/HI/AVH. Patient compliant with medication administration. Patient isolative to room during shift with the exception of coming out for meals.  Q15 minute safety checks maintained. Patient remains safe on the unit at this time.

## 2021-05-20 NOTE — Progress Notes (Signed)
Patient got his first dose of Remeron with med teaching but still asked for Ambien to help him sleep. Denies SI, HI and AVH

## 2021-05-21 LAB — HEMOGLOBIN A1C
Hgb A1c MFr Bld: 5.1 % (ref 4.8–5.6)
Mean Plasma Glucose: 100 mg/dL
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
## 2021-05-21 NOTE — Plan of Care (Signed)
Patient stated that he is feeling better and wants to go back to work. Patient stated that he can go back to his parents house. Stays in bed most of the shift. Denies SI,HI and AVH at this time. Patient stated that his appetite is better now. Encouraged for ADLs. Support and encouragement given.

## 2021-05-21 NOTE — BH IP Treatment Plan (Signed)
Interdisciplinary Treatment and Diagnostic Plan Update  05/21/2021 Time of Session: 9:30AM [name] MRN: [id number]  Principal Diagnosis: Schizophrenia [facility])  Secondary Diagnoses: Principal Problem:   Schizophrenia ([place]) Active Problems:   Dehydration   Current Medications:  Current Facility-Administered Medications  Medication Dose Route Frequency Provider Last Rate Last Admin   acetaminophen (TYLENOL) tablet 650 mg  650 mg Oral Q6H PRN Clapacs, [name], MD       alum & mag hydroxide-simeth (MAALOX/MYLANTA) 200-200-20 MG/5ML suspension 30 mL  30 mL Oral Q4H PRN Clapacs, [name], MD       ARIPiprazole (ABILIFY) tablet 20 mg  20 mg Oral Daily Clapacs, [name], MD   20 mg at 05/21/21 [id number]   hydrOXYzine (ATARAX) tablet 50 mg  50 mg Oral TID PRN Clapacs, [name], MD       magnesium hydroxide (MILK OF MAGNESIA) suspension 30 mL  30 mL Oral Daily PRN Clapacs, [name], MD       mirtazapine (REMERON) tablet 15 mg  15 mg Oral QHS [name], DO   15 mg at 05/20/21 2106   nicotine polacrilex (NICORETTE) gum 2 mg  2 mg Oral PRN Clapacs, [name], MD   2 mg at 05/20/21 2011   risperiDONE (RISPERDAL M-TABS) disintegrating tablet 2 mg  2 mg Oral Q8H PRN Clapacs, [name], MD       And   ziprasidone (GEODON) injection 20 mg  20 mg Intramuscular PRN Clapacs, [name], MD       zolpidem (AMBIEN) tablet 10 mg  10 mg Oral QHS PRN [name], DO   10 mg at 05/20/21 2106   PTA Medications: Medications Prior to Admission  Medication Sig Dispense Refill Last Dose   ARIPiprazole (ABILIFY) 20 MG tablet Take 20 mg by mouth at bedtime. (Patient not taking: Reported on 05/14/2021)      nicotine polacrilex (NICORETTE) 2 MG gum Take 1 each (2 mg total) by mouth as needed for smoking cessation. (Patient not taking: Reported on 11/15/2019) 100 tablet 0    zolpidem (AMBIEN) 10 MG tablet Take 10 mg by mouth daily. (Patient not taking: Reported on 05/14/2021)       Patient Stressors: Other:  Patient states "I don't know" when asked about stressors    Patient Strengths: Supportive family/friends   Treatment Modalities: Medication Management, Group therapy, Case management,  1 to 1 session with clinician, Psychoeducation, Recreational therapy.   Physician Treatment Plan for Primary Diagnosis: Schizophrenia ([place]) Long Term Goal(s): Improvement in symptoms so as ready for discharge   Short Term Goals: Compliance with prescribed medications will improve Ability to verbalize feelings will improve Ability to identify and develop effective coping behaviors will improve  Medication Management: Evaluate patient's response, side effects, and tolerance of medication regimen.  Therapeutic Interventions: 1 to 1 sessions, Unit Group sessions and Medication administration.  Evaluation of Outcomes: Progressing  Physician Treatment Plan for Secondary Diagnosis: Principal Problem:   Schizophrenia ([place]) Active Problems:   Dehydration  Long Term Goal(s): Improvement in symptoms so as ready for discharge   Short Term Goals: Compliance with prescribed medications will improve Ability to verbalize feelings will improve Ability to identify and develop effective coping behaviors will improve     Medication Management: Evaluate patient's response, side effects, and tolerance of medication regimen.  Therapeutic Interventions: 1 to 1 sessions, Unit Group sessions and Medication administration.  Evaluation of Outcomes: Progressing   RN Treatment Plan for Primary Diagnosis: Schizophrenia ([place])  Long Term Goal(s): Knowledge of disease and therapeutic regimen to maintain health will improve  Short Term Goals: Ability to demonstrate self-control, Ability to participate in decision making will improve, Ability to verbalize feelings will improve, Ability to disclose and discuss suicidal ideas, Ability to identify and develop effective coping behaviors will improve, and Compliance with prescribed  medications will improve  Medication Management: RN will administer medications as ordered by provider, will assess and evaluate patient's response and provide education to patient for prescribed medication. RN will report any adverse and/or side effects to prescribing provider.  Therapeutic Interventions: 1 on 1 counseling sessions, Psychoeducation, Medication administration, Evaluate responses to treatment, Monitor vital signs and CBGs as ordered, Perform/monitor CIWA, COWS, AIMS and Fall Risk screenings as ordered, Perform wound care treatments as ordered.  Evaluation of Outcomes: Progressing   LCSW Treatment Plan for Primary Diagnosis: Schizophrenia ([place]) Long Term Goal(s): Safe transition to appropriate next level of care at discharge, Engage patient in therapeutic group addressing interpersonal concerns.  Short Term Goals: Engage patient in aftercare planning with referrals and resources, Increase social support, Increase ability to appropriately verbalize feelings, Increase emotional regulation, Facilitate acceptance of mental health diagnosis and concerns, and Increase skills for wellness and recovery  Therapeutic Interventions: Assess for all discharge needs, 1 to 1 time with Social worker, Explore available resources and support systems, Assess for adequacy in community support network, Educate family and significant other(s) on suicide prevention, Complete Psychosocial Assessment, Interpersonal group therapy.  Evaluation of Outcomes: Progressing   Progress in Treatment: Attending groups: No. Participating in groups: No. Taking medication as prescribed: Yes. Toleration medication: Yes. Family/Significant other contact made: Yes, individual(s) contacted:  SPE completed with the patients father and legal guardian.  Patient understands diagnosis: Yes. Discussing patient identified problems/goals with staff: Yes. Medical problems stabilized or resolved: Yes. Denies  suicidal/homicidal ideation: Yes. Issues/concerns per patient self-inventory: No. Other: none  New problem(s) identified: No, Describe:  none  New Short Term/Long Term Goal(s): elimination of symptoms of psychosis, medication management for mood stabilization; elimination of SI thoughts; development of comprehensive mental wellness plan.   Patient Goals:  "I've been taking my medication and I'm ready to leave"  Discharge Plan or Barriers: Patient reports plans to return to the home with his parents.  CSW will assist patient in developing appropriate discharge plans.   Reason for Continuation of Hospitalization: Anxiety Depression Medication stabilization Suicidal ideation  Estimated Length of Stay:  1-7 days   Scribe for Treatment Team: Rozann Lesches, Marlinda Mike 05/21/2021 11:27 AM

## 2021-05-21 NOTE — Progress Notes (Signed)
[facility] MD Progress Note  05/21/2021 3:29 PM Brendan Hogan  MRN:  [id number] Subjective: Follow-up for this 26 year old with schizophrenia.  Patient is a little bit more organized than he was when he first came in.  Not talking to himself today.  Able to make a little bit of eye contact.  Tolerating medication.  I spent quite a bit of time trying to convince him to take long-acting injectable Abilify but he refuses. Principal Problem: Schizophrenia ([place]) Diagnosis: Principal Problem:   Schizophrenia ([place]) Active Problems:   Dehydration  Total Time spent with patient: 30 minutes  Past Psychiatric History: Past history of schizophrenia multiple prior hospitalizations  Past Medical History:  Past Medical History:  Diagnosis Date   Psychosis ([place]) 05/2019   [place]    Seasonal allergies     Past Surgical History:  Procedure Laterality Date   TONSILLECTOMY     Family History:  Family History  Problem Relation Age of Onset   Hypertension Mother    Family Psychiatric  History: See previous Social History:  Social History   Substance and Sexual Activity  Alcohol Use Not Currently   Comment: rare     Social History   Substance and Sexual Activity  Drug Use No    Social History   Socioeconomic History   Marital status: Single    Spouse name: Not on file   Number of children: Not on file   Years of education: Not on file   Highest education level: Not on file  Occupational History   Not on file  Tobacco Use   Smoking status: Every Day    Packs/day: 1.00    Types: Cigarettes   Smokeless tobacco: Former  Scientific laboratory technician Use: Never used  Substance and Sexual Activity   Alcohol use: Not Currently    Comment: rare   Drug use: No   Sexual activity: Not Currently  Other Topics Concern   Not on file  Social History Narrative   Not on file   Social Determinants of Health   Financial Resource Strain: Not on file  Food Insecurity: Not on file   Transportation Needs: Not on file  Physical Activity: Not on file  Stress: Not on file  Social Connections: Not on file   Additional Social History:                         Sleep: Fair  Appetite:  Fair  Current Medications: Current Facility-Administered Medications  Medication Dose Route Frequency Provider Last Rate Last Admin   acetaminophen (TYLENOL) tablet 650 mg  650 mg Oral Q6H PRN Wanda Cellucci T, MD       alum & mag hydroxide-simeth (MAALOX/MYLANTA) 200-200-20 MG/5ML suspension 30 mL  30 mL Oral Q4H PRN Ernie Sagrero T, MD       ARIPiprazole (ABILIFY) tablet 20 mg  20 mg Oral Daily Renzo Vincelette T, MD   20 mg at 05/21/21 [id number]   hydrOXYzine (ATARAX) tablet 50 mg  50 mg Oral TID PRN Marquay Kruse, [name], MD       magnesium hydroxide (MILK OF MAGNESIA) suspension 30 mL  30 mL Oral Daily PRN Everleigh Colclasure T, MD       mirtazapine (REMERON) tablet 15 mg  15 mg Oral QHS [name], DO   15 mg at 05/20/21 2106   nicotine polacrilex (NICORETTE) gum 2 mg  2 mg Oral PRN Benjimen Kelley, [name], MD  2 mg at 05/21/21 1204   risperiDONE (RISPERDAL M-TABS) disintegrating tablet 2 mg  2 mg Oral Q8H PRN Shannell Mikkelsen T, MD       And   ziprasidone (GEODON) injection 20 mg  20 mg Intramuscular PRN Sonya Gunnoe, Jackquline Denmark, MD       zolpidem (AMBIEN) tablet 10 mg  10 mg Oral QHS PRN Sarina Ill, DO   10 mg at 05/20/21 2106    Lab Results: No results found for this or any previous visit (from the past 48 hour(s)).  Blood Alcohol level:  Lab Results  Component Value Date   ETH <10 05/14/2021   ETH <10 03/23/2021    Metabolic Disorder Labs: Lab Results  Component Value Date   HGBA1C 5.1 05/19/2021   MPG 100 05/19/2021   MPG 96.8 09/08/2019   Lab Results  Component Value Date   PROLACTIN 26.5 (H) 09/08/2019   Lab Results  Component Value Date   CHOL 106 05/19/2021   TRIG 68 05/19/2021   HDL 30 (L) 05/19/2021   CHOLHDL 3.5 05/19/2021   VLDL 14 05/19/2021   LDLCALC  62 05/19/2021   LDLCALC 92 09/08/2019    Physical Findings: AIMS:  , ,  ,  ,    CIWA:    COWS:     Musculoskeletal: Strength & Muscle Tone: within normal limits Gait & Station: normal Patient leans: N/A  Psychiatric Specialty Exam:  Presentation  General Appearance: Disheveled  Eye Contact:Minimal  Speech:Slow  Speech Volume:Decreased  Handedness:Right   Mood and Affect  Mood:Dysphoric  Affect:Congruent   Thought Process  Thought Processes:Coherent  Descriptions of Associations:Intact  Orientation:Full (Time, Place and Person)  Thought Content:WDL  History of Schizophrenia/Schizoaffective disorder:Yes  Duration of Psychotic Symptoms:Greater than six months  Hallucinations:No data recorded Ideas of Reference:None  Suicidal Thoughts:No data recorded Homicidal Thoughts:No data recorded  Sensorium  Memory:Immediate Fair; Recent Fair; Remote Fair  Judgment:Poor  Insight:Shallow   Executive Functions  Concentration:Fair  Attention Span:Fair  Recall:Fair  Fund of Knowledge:Fair  Language:Fair   Psychomotor Activity  Psychomotor Activity:No data recorded  Assets  Assets:Housing; Physical Health; Leisure Time; Social Support   Sleep  Sleep:No data recorded   Physical Exam: Physical Exam Vitals and nursing note reviewed.  Constitutional:      Appearance: Normal appearance.  HENT:     Head: Normocephalic and atraumatic.     Mouth/Throat:     Pharynx: Oropharynx is clear.  Eyes:     Pupils: Pupils are equal, round, and reactive to light.  Cardiovascular:     Rate and Rhythm: Normal rate and regular rhythm.  Pulmonary:     Effort: Pulmonary effort is normal.     Breath sounds: Normal breath sounds.  Abdominal:     General: Abdomen is flat.     Palpations: Abdomen is soft.  Musculoskeletal:        General: Normal range of motion.  Skin:    General: Skin is warm and dry.  Neurological:     General: No focal deficit present.      Mental Status: He is alert. Mental status is at baseline.  Psychiatric:        Attention and Perception: Attention normal.        Mood and Affect: Mood normal. Affect is blunt.        Speech: Speech is delayed.        Behavior: Behavior is cooperative.        Thought Content: Thought content normal.  Cognition and Memory: Cognition is impaired.   Review of Systems  Constitutional: Negative.   HENT: Negative.    Eyes: Negative.   Respiratory: Negative.    Cardiovascular: Negative.   Gastrointestinal: Negative.   Musculoskeletal: Negative.   Skin: Negative.   Neurological: Negative.   Psychiatric/Behavioral:  Positive for memory loss. Negative for depression, hallucinations, substance abuse and suicidal ideas. The patient is nervous/anxious. The patient does not have insomnia.   Blood pressure 102/75, pulse 86, temperature 97.8 F (36.6 C), temperature source Oral, resp. rate 18, height 5' 6.93" (1.7 m), weight 59 kg, SpO2 100 %. Body mass index is 20.4 kg/m.   Treatment Plan Summary: Medication management and Plan patient would benefit dramatically by taking long-acting injectable Abilify but refuses based on the bad reaction he had to Haldol decanoate.  Tried to educate for quite a while without much benefit.  For now no change in medicine continue group involvement.  Alethia Berthold, MD 05/21/2021, 3:29 PM

## 2021-05-21 NOTE — Plan of Care (Signed)
  Problem: Education: Goal: Knowledge of [place] General Education information/materials will improve Outcome: Progressing Goal: Emotional status will improve Outcome: Progressing Goal: Mental status will improve Outcome: Progressing Goal: Verbalization of understanding the information provided will improve Outcome: Progressing   Problem: Activity: Goal: Interest or engagement in activities will improve Outcome: Progressing Goal: Sleeping patterns will improve Outcome: Progressing   Problem: Coping: Goal: Ability to verbalize frustrations and anger appropriately will improve Outcome: Progressing Goal: Ability to demonstrate self-control will improve Outcome: Progressing   Problem: Health Behavior/Discharge Planning: Goal: Identification of resources available to assist in meeting health care needs will improve Outcome: Progressing Goal: Compliance with treatment plan for underlying cause of condition will improve Outcome: Progressing   Problem: Physical Regulation: Goal: Ability to maintain clinical measurements within normal limits will improve Outcome: Progressing   Problem: Safety: Goal: Periods of time without injury will increase Outcome: Progressing   

## 2021-05-21 NOTE — Group Note (Signed)
[facility] LCSW Group Therapy Note ° ° ° °Group Date: 05/21/2021 °Start Time: 0100 °End Time: 0200 ° °Type of Therapy and Topic:  Group Therapy:  Overcoming Obstacles ° °Participation Level:  [facility] PARTICIPATION LEVEL: Did Not Attend ° ° ° °Description of Group:   °In this group patients will be encouraged to explore what they see as obstacles to their own wellness and recovery. They will be guided to discuss their thoughts, feelings, and behaviors related to these obstacles. The group will process together ways to cope with barriers, with attention given to specific choices patients can make. Each patient will be challenged to identify changes they are motivated to make in order to overcome their obstacles. This group will be process-oriented, with patients participating in exploration of their own experiences as well as giving and receiving support and challenge from other group members. ° °Therapeutic Goals: °1. Patient will identify personal and current obstacles as they relate to admission. °2. Patient will identify barriers that currently interfere with their wellness or overcoming obstacles.  °3. Patient will identify feelings, thought process and behaviors related to these barriers. °4. Patient will identify two changes they are willing to make to overcome these obstacles:  ° ° °Summary of Patient Progress ° ° °X ° ° °Therapeutic Modalities:   °Cognitive Behavioral Therapy °Solution Focused Therapy °Motivational Interviewing °Relapse Prevention Therapy ° ° °Nylan Nevel, Student-Social Work °

## 2021-05-21 NOTE — Progress Notes (Signed)
Patient stated he will talk with the Provider in the morning about Remeron "I think it kept me up I will talk to the Doctor in the morning and see if I can stop it" Patient educated on his medication and compliant. Prn ambien 10 mg PO given at 2124 per Patient request. Patient denies SI/HI/A/VH   No adverse drug noted. Support and encouragement provided as needed.

## 2021-05-22 ENCOUNTER — Other Ambulatory Visit: Payer: Self-pay

## 2021-05-22 DIAGNOSIS — F203 Undifferentiated schizophrenia: Secondary | ICD-10-CM | POA: Diagnosis not present

## 2021-05-22 MED ORDER — MIRTAZAPINE 15 MG PO TABS
15.0000 mg | ORAL_TABLET | Freq: Every day | ORAL | 0 refills | Status: DC
  Filled 2021-05-22: qty 10, 10d supply, fill #0

## 2021-05-22 MED ORDER — ARIPIPRAZOLE 20 MG PO TABS
20.0000 mg | ORAL_TABLET | Freq: Every day | ORAL | 0 refills | Status: DC
  Filled 2021-05-22: qty 10, 10d supply, fill #0

## 2021-05-22 NOTE — Progress Notes (Signed)
Recreation Therapy Notes   Date: 05/22/2021  Time: 10:05am    Location: Craft room      Behavioral response: N/A   Intervention Topic: Time Management    Discussion/Intervention: Patient refused to attend group.   Clinical Observations/Feedback:  Patient refused to attend group.    Sheralyn Pinegar LRT/CTRS        Cristol Engdahl 05/22/2021 11:02 AM

## 2021-05-22 NOTE — Progress Notes (Signed)
Patient denies pain. Patient denies anxiety and depression. Patient denies SI/HI/AVH. Patient compliant with medication administration. Patient interactive with peers and staff. Q15 minute safety check maintained. Patient remains safe on the unit at this time.

## 2021-05-22 NOTE — Progress Notes (Signed)
[facility] MD Progress Note  05/22/2021 4:06 PM Brendan Hogan  MRN:  [id number] Subjective: Follow-up for this young man with schizophrenia.  Patient has been compliant with oral medicine and tells me that he feels fine.  His insight is still pretty limited.  He is more appropriately interactive however and he is eating and drinking normally.  Still stays pretty isolated to his room.  Able to articulate a plan for future self-care and trying to do some more work.  I engaged him in discussion again about Abilify long-acting injectable and he still is declining to consider that treatment Principal Problem: Schizophrenia ([place]) Diagnosis: Principal Problem:   Schizophrenia ([place]) Active Problems:   Dehydration  Total Time spent with patient: 30 minutes  Past Psychiatric History: Past history of schizophrenia several prior hospitalizations recurrent noncompliance  Past Medical History:  Past Medical History:  Diagnosis Date   Psychosis ([place]) 05/2019   [place]    Seasonal allergies     Past Surgical History:  Procedure Laterality Date   TONSILLECTOMY     Family History:  Family History  Problem Relation Age of Onset   Hypertension Mother    Family Psychiatric  History: See previous Social History:  Social History   Substance and Sexual Activity  Alcohol Use Not Currently   Comment: rare     Social History   Substance and Sexual Activity  Drug Use No    Social History   Socioeconomic History   Marital status: Single    Spouse name: Not on file   Number of children: Not on file   Years of education: Not on file   Highest education level: Not on file  Occupational History   Not on file  Tobacco Use   Smoking status: Every Day    Packs/day: 1.00    Types: Cigarettes   Smokeless tobacco: Former  Scientific laboratory technician Use: Never used  Substance and Sexual Activity   Alcohol use: Not Currently    Comment: rare   Drug use: No   Sexual activity: Not  Currently  Other Topics Concern   Not on file  Social History Narrative   Not on file   Social Determinants of Health   Financial Resource Strain: Not on file  Food Insecurity: Not on file  Transportation Needs: Not on file  Physical Activity: Not on file  Stress: Not on file  Social Connections: Not on file   Additional Social History:                         Sleep: Fair  Appetite:  Fair  Current Medications: Current Facility-Administered Medications  Medication Dose Route Frequency Provider Last Rate Last Admin   acetaminophen (TYLENOL) tablet 650 mg  650 mg Oral Q6H PRN Teghan Philbin T, MD       alum & mag hydroxide-simeth (MAALOX/MYLANTA) 200-200-20 MG/5ML suspension 30 mL  30 mL Oral Q4H PRN Jazzman Loughmiller T, MD       ARIPiprazole (ABILIFY) tablet 20 mg  20 mg Oral Daily Vienne Corcoran T, MD   20 mg at 05/22/21 0817   hydrOXYzine (ATARAX) tablet 50 mg  50 mg Oral TID PRN Mykal Kirchman, [name], MD       magnesium hydroxide (MILK OF MAGNESIA) suspension 30 mL  30 mL Oral Daily PRN Josiane Labine T, MD       mirtazapine (REMERON) tablet 15 mg  15 mg Oral QHS  [name], DO   15 mg at 05/21/21 2107   nicotine polacrilex (NICORETTE) gum 2 mg  2 mg Oral PRN Ruvi Fullenwider, [name], MD   2 mg at 05/22/21 1233   risperiDONE (RISPERDAL M-TABS) disintegrating tablet 2 mg  2 mg Oral Q8H PRN Jeani Fassnacht, [name], MD       And   ziprasidone (GEODON) injection 20 mg  20 mg Intramuscular PRN Tamila Gaulin, [name], MD       zolpidem (AMBIEN) tablet 10 mg  10 mg Oral QHS PRN [name], DO   10 mg at 05/21/21 2124    Lab Results: No results found for this or any previous visit (from the past 48 hour(s)).  Blood Alcohol level:  Lab Results  Component Value Date   ETH <10 05/14/2021   ETH <10 99991111    Metabolic Disorder Labs: Lab Results  Component Value Date   HGBA1C 5.1 05/19/2021   MPG 100 05/19/2021   MPG 96.8 09/08/2019   Lab Results  Component Value Date    PROLACTIN 26.5 (H) 09/08/2019   Lab Results  Component Value Date   CHOL 106 05/19/2021   TRIG 68 05/19/2021   HDL 30 (L) 05/19/2021   CHOLHDL 3.5 05/19/2021   VLDL 14 05/19/2021   LDLCALC 62 05/19/2021   LDLCALC 92 09/08/2019    Physical Findings: AIMS:  , ,  ,  ,    CIWA:    COWS:     Musculoskeletal: Strength & Muscle Tone: within normal limits Gait & Station: normal Patient leans: N/A  Psychiatric Specialty Exam:  Presentation  General Appearance: Disheveled  Eye Contact:Minimal  Speech:Slow  Speech Volume:Decreased  Handedness:Right   Mood and Affect  Mood:Dysphoric  Affect:Congruent   Thought Process  Thought Processes:Coherent  Descriptions of Associations:Intact  Orientation:Full (Time, Place and Person)  Thought Content:WDL  History of Schizophrenia/Schizoaffective disorder:Yes  Duration of Psychotic Symptoms:Greater than six months  Hallucinations:No data recorded Ideas of Reference:None  Suicidal Thoughts:No data recorded Homicidal Thoughts:No data recorded  Sensorium  Memory:Immediate Fair; Recent Fair; Remote Fair  Judgment:Poor  Insight:Shallow   Executive Functions  Concentration:Fair  Attention Span:Fair  [place]   Psychomotor Activity  Psychomotor Activity:No data recorded  Assets  Assets:Housing; Physical Health; Leisure Time; Social Support   Sleep  Sleep:No data recorded   Physical Exam: Physical Exam Vitals and nursing note reviewed.  Constitutional:      Appearance: Normal appearance.  HENT:     Head: Normocephalic and atraumatic.     Mouth/Throat:     Pharynx: Oropharynx is clear.  Eyes:     Pupils: Pupils are equal, round, and reactive to light.  Cardiovascular:     Rate and Rhythm: Normal rate and regular rhythm.  Pulmonary:     Effort: Pulmonary effort is normal.     Breath sounds: Normal breath sounds.  Abdominal:     General: Abdomen is  flat.     Palpations: Abdomen is soft.  Musculoskeletal:        General: Normal range of motion.  Skin:    General: Skin is warm and dry.  Neurological:     General: No focal deficit present.     Mental Status: He is alert. Mental status is at baseline.  Psychiatric:        Mood and Affect: Mood normal.        Thought Content: Thought content normal.   Review of Systems  Constitutional:  Negative.   HENT: Negative.    Eyes: Negative.   Respiratory: Negative.    Cardiovascular: Negative.   Gastrointestinal: Negative.   Musculoskeletal: Negative.   Skin: Negative.   Neurological: Negative.   Psychiatric/Behavioral: Negative.    Blood pressure 102/75, pulse 86, temperature 97.8 F (36.6 C), temperature source Oral, resp. rate 18, height 5' 6.93" (1.7 m), weight 59 kg, SpO2 100 %. Body mass index is 20.4 kg/m.   Treatment Plan Summary: Medication management and Plan patient is making it clear that he will not take the long-acting injectable in which case he has probably gotten as much benefit as he is likely to get from this hospital stay.  We will begin plans for likely discharge tomorrow with outpatient follow-up.  I did some psychoeducation with him trying to convince him of how much better his outcome would be if he would stay on his medicine.  Alethia Berthold, MD 05/22/2021, 4:06 PM

## 2021-05-22 NOTE — [facility] Counselor (Signed)
CSW spoke with the patient's father at father's request.  Father reports that he was curious about the patient taking his medications.  CSW had nurse confirm that patient has been taking his medication.   Father reports concerns that the patient has been talking "about something or someone coming after him".   CSW informed that she would make the psychiatrist aware.  Assunta Curtis, MSW, LCSW 05/22/2021 3:27 PM

## 2021-05-22 NOTE — Progress Notes (Signed)
Patient presents with sad, flat affect. Slow to respond. Minimal interaction with staff and peers. Isolative to self. Came out for night meds. Denies SI, HI, AVH. Medication compliant. No complaints or concerns voiced.  Encouragement and support provided. Safety checks maintained. Medications given as prescribed. Pt receptive and remains safe on unit with q 15 min checks.

## 2021-05-22 NOTE — Group Note (Signed)
[facility] LCSW Group Therapy Note ° ° °Group Date: 05/22/2021 °Start Time: 0130 °End Time: 0215 ° °Type of Therapy/Topic:  Group Therapy:  Feelings about Diagnosis ° °Participation Level:  Did Not Attend  ° ° °Description of Group:   ° This group will allow patients to explore their thoughts and feelings about diagnoses they have received. Patients will be guided to explore their level of understanding and acceptance of these diagnoses. Facilitator will encourage patients to process their thoughts and feelings about the reactions of others to their diagnosis, and will guide patients in identifying ways to discuss their diagnosis with significant others in their lives. This group will be process-oriented, with patients participating in exploration of their own experiences as well as giving and receiving support and challenge from other group members. ° ° °Therapeutic Goals: °1. Patient will demonstrate understanding of diagnosis as evidence by identifying two or more symptoms of the disorder:  °2. Patient will be able to express two feelings regarding the diagnosis °3. Patient will demonstrate ability to communicate their needs through discussion and/or role plays ° °Summary of Patient Progress: ° °X ° ° ° ° ° °Therapeutic Modalities:   °Cognitive Behavioral Therapy °Brief Therapy °Feelings Identification  ° ° °Anum Palecek, Student-Social Work °

## 2021-05-22 NOTE — Plan of Care (Signed)
  Problem: Education: Goal: Knowledge of [facility] Education information/materials will improve Outcome: Progressing   Problem: Education: Goal: Emotional status will improve Outcome: Progressing   Problem: Education: Goal: Mental status will improve Outcome: Progressing   Problem: Education: Goal: Verbalization of understanding the information provided will improve Outcome: Progressing   Problem: Activity: Goal: Interest or engagement in activities will improve Outcome: Progressing   

## 2021-05-23 DIAGNOSIS — F203 Undifferentiated schizophrenia: Secondary | ICD-10-CM | POA: Diagnosis not present

## 2021-05-23 MED ORDER — ZOLPIDEM TARTRATE 10 MG PO TABS: 10.0000 mg | ORAL_TABLET | Freq: Every day | ORAL | 1 refills | Status: DC

## 2021-05-23 MED ORDER — MIRTAZAPINE 15 MG PO TABS: 15.0000 mg | ORAL_TABLET | Freq: Every day | ORAL | 1 refills | Status: DC

## 2021-05-23 MED ORDER — ARIPIPRAZOLE 20 MG PO TABS: 20.0000 mg | ORAL_TABLET | Freq: Every day | ORAL | 1 refills | Status: DC

## 2021-05-23 NOTE — Progress Notes (Signed)
°  [facility] Adult Case Management Discharge Plan :  Will you be returning to the same living situation after discharge:  Yes,  pt reports he is going to his home with hi father At discharge, do you have transportation home?: Yes,  pt's father reports that he will provide transportation for the patient.  Do you have the ability to pay for your medications: No.  Release of information consent forms completed and in the chart;  Patient's signature needed at discharge.  Patient to Follow up at:  Follow-up Information     [facility] Follow up.   Why: Appointment is scheduled for 05/28/2021 at 1:40PM. Contact information: [address] [place] [address] [phone]                 Next level of care provider has access to [facility] Link:no  Safety Planning and Suicide Prevention discussed: Yes,  SPE completed with the patient.      Has patient been referred to the Quitline?: Patient refused referral  Patient has been referred for addiction treatment: Pt. refused referral  Harden Mo, LCSW 05/23/2021, 12:44 PM

## 2021-05-23 NOTE — [facility] Suicide Risk Assessment (Signed)
[facility] Discharge Suicide Risk Assessment   Principal Problem: Schizophrenia [facility]) Discharge Diagnoses: Principal Problem:   Schizophrenia ([facility]) Active Problems:   Dehydration   Total Time spent with patient: 45 minutes  Musculoskeletal: Strength & Muscle Tone: within normal limits Gait & Station: normal Patient leans: N/A  Psychiatric Specialty Exam  Presentation  General Appearance: Disheveled  Eye Contact:Minimal  Speech:Slow  Speech Volume:Decreased  Handedness:Right   Mood and Affect  Mood:Dysphoric  Duration of Depression Symptoms: Less than two weeks  Affect:Congruent   Thought Process  Thought Processes:Coherent  Descriptions of Associations:Intact  Orientation:Full (Time, Place and Person)  Thought Content:WDL  History of Schizophrenia/Schizoaffective disorder:Yes  Duration of Psychotic Symptoms:Greater than six months  Hallucinations:No data recorded Ideas of Reference:None  Suicidal Thoughts:No data recorded Homicidal Thoughts:No data recorded  Sensorium  Memory:Immediate Fair; Recent Fair; Remote Fair  Judgment:Poor  Insight:Shallow   Executive Functions  Concentration:Fair  Attention Span:Fair  Recall:Fair  Fund of Knowledge:Fair  Language:Fair   Psychomotor Activity  Psychomotor Activity:No data recorded  Assets  Assets:Housing; Physical Health; Leisure Time; Social Support   Sleep  Sleep:No data recorded  Physical Exam: Physical Exam Vitals and nursing note reviewed.  Constitutional:      Appearance: Normal appearance.  HENT:     Head: Normocephalic and atraumatic.     Mouth/Throat:     Pharynx: Oropharynx is clear.  Eyes:     Pupils: Pupils are equal, round, and reactive to light.  Cardiovascular:     Rate and Rhythm: Normal rate and regular rhythm.  Pulmonary:     Effort: Pulmonary effort is normal.     Breath sounds: Normal breath sounds.  Abdominal:     General: Abdomen is flat.     Palpations:  Abdomen is soft.  Musculoskeletal:        General: Normal range of motion.  Skin:    General: Skin is warm and dry.  Neurological:     General: No focal deficit present.     Mental Status: He is alert. Mental status is at baseline.  Psychiatric:        Mood and Affect: Mood normal.        Thought Content: Thought content normal.   Review of Systems  Constitutional: Negative.   HENT: Negative.    Eyes: Negative.   Respiratory: Negative.    Cardiovascular: Negative.   Gastrointestinal: Negative.   Musculoskeletal: Negative.   Skin: Negative.   Neurological: Negative.   Psychiatric/Behavioral: Negative.    Blood pressure 105/75, pulse 86, temperature 98.1 F (36.7 C), temperature source Oral, resp. rate 17, height 5' 6.93" (1.7 m), weight 59 kg, SpO2 100 %. Body mass index is 20.4 kg/m.  Mental Status Per Nursing Assessment::   On Admission:  NA  Demographic Factors:  Male and Adolescent or young adult  Loss Factors: NA  Historical Factors: Impulsivity  Risk Reduction Factors:   Sense of responsibility to family, Employed, Living with another person, especially a relative, and Positive social support  Continued Clinical Symptoms:  Schizophrenia:   Less than 19 years old  Cognitive Features That Contribute To Risk:  None    Suicide Risk:  Minimal: No identifiable suicidal ideation.  Patients presenting with no risk factors but with morbid ruminations; may be classified as minimal risk based on the severity of the depressive symptoms    Plan Of Care/Follow-up recommendations:  Patient is denying any suicidal ideation and has not displayed any dangerous behavior or any inclination to self-harm and has  positive plans for the future.  Patient was given extensive and specific information about schizophrenia and the crucial need to stay in treatment.  Referred to outpatient treatment at discharge  Alethia Berthold, MD 05/23/2021, 10:28 AM

## 2021-05-23 NOTE — Progress Notes (Signed)
Recreation Therapy Notes    Date: 05/23/2021  Time: 10:30   Location: Craft room      Behavioral response: N/A   Intervention Topic: Happiness    Discussion/Intervention: Patient refused to attend group.   Clinical Observations/Feedback:  Patient refused to attend group.    Eissa Buchberger LRT/CTRS        Mischa Brittingham 05/23/2021 12:14 PM

## 2021-05-23 NOTE — Group Note (Signed)
[facility] LCSW Group Therapy Note   Group Date: 04/02/2022 Start Time: 1300 End Time: 1400   Type of Therapy/Topic:  Group Therapy:  Emotion Regulation  Participation Level:  Did Not Attend   Mood:  Description of Group:    The purpose of this group is to assist patients in learning to regulate negative emotions and experience positive emotions. Patients will be guided to discuss ways in which they have been vulnerable to their negative emotions. These vulnerabilities will be juxtaposed with experiences of positive emotions or situations, and patients challenged to use positive emotions to combat negative ones. Special emphasis will be placed on coping with negative emotions in conflict situations, and patients will process healthy conflict resolution skills.  Therapeutic Goals: Patient will identify two positive emotions or experiences to reflect on in order to balance out negative emotions:  Patient will label two or more emotions that they find the most difficult to experience:  Patient will be able to demonstrate positive conflict resolution skills through discussion or role plays:   Summary of Patient Progress:   Patient did not attend group despite encouraged participation.     Therapeutic Modalities:   Cognitive Behavioral Therapy Feelings Identification Dialectical Behavioral Therapy   [facility] W Kalyn Hofstra, LCSWA 

## 2021-05-23 NOTE — Progress Notes (Signed)
Patient denies SI,HI and AVH. Appropriate with staff & peers. Verbalized understanding of discharge instructions, follow up care and prescriptions. Patient escorted out by staff & transported by family.

## 2021-05-23 NOTE — Discharge Summary (Signed)
Physician Discharge Summary Note  Patient:  Brendan Hogan is an 26 y.o., male MRN:  [id number] DOB:  09-13-1995 Patient phone:  [phone] (home)  Patient address:   [place] [address],  Total Time spent with patient: 45 minutes  Date of Admission:  05/18/2021 Date of Discharge: 05/23/2021  Reason for Admission: Admitted after presentation under IVC with psychosis manifesting as failure to eat failure to sleep failure to take care of himself severe withdrawal almost catatonic at presentation  Principal Problem: Schizophrenia 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  Past Medical History:  Past Medical History:  Diagnosis Date   Psychosis ([place]) 05/2019   [place]    Seasonal allergies     Past Surgical History:  Procedure Laterality Date   TONSILLECTOMY     Family History:  Family History  Problem Relation Age of Onset   Hypertension Mother    Family Psychiatric  History: See previous Social History:  Social History   Substance and Sexual Activity  Alcohol Use Not Currently   Comment: rare     Social History   Substance and Sexual Activity  Drug Use No    Social History   Socioeconomic History   Marital status: Single    Spouse name: Not on file   Number of children: Not on file   Years of education: Not on file   Highest education level: Not on file  Occupational History   Not on file  Tobacco Use   Smoking status: Every Day    Packs/day: 1.00    Types: Cigarettes   Smokeless tobacco: Former  Scientific laboratory technician Use: Never used  Substance and Sexual Activity   Alcohol use: Not Currently    Comment: rare   Drug use: No   Sexual activity: Not Currently  Other Topics Concern   Not on file  Social History Narrative   Not on file    Social Determinants of Health   Financial Resource Strain: Not on file  Food Insecurity: Not on file  Transportation Needs: Not on file  Physical Activity: Not on file  Stress: Not on file  Social Connections: Not on file    Hospital Course: Patient has been maintained on 15-minute checks.  He has not shown any dangerous behavior.  Initially very passive he did take oral medicine and became quickly more interactive.  He was eating and drinking appropriately attended some groups and was better able to communicate.  Several times I attempted to convince the patient to agree to Abilify long-acting injectable.  I have explained the benefits including more consistent blood level and particularly more consistent compliance which results in fewer hospitalizations and overall much better functioning.  Patient has repeatedly declined despite my reassurance that we would do everything possible to avoid side effects.  He has been given information about schizophrenia and the crucial need to stay in treatment to achieve better outcomes and better functioning.  Patient is to be discharged today on current medicine recommended for outpatient follow-up.  Physical Findings: AIMS:  , ,  ,  ,    CIWA:    COWS:     Musculoskeletal: Strength & Muscle Tone: within normal limits Gait & Station: normal Patient leans: N/A   Psychiatric Specialty Exam:  Presentation  General Appearance: [id number]  Eye Contact:Minimal  Speech:Slow  Speech Volume:Decreased  Handedness:Right   Mood and Affect  Mood:Dysphoric  Affect:Congruent   Thought Process  Thought Processes:Coherent  Descriptions of Associations:Intact  Orientation:Full (Time, Place and Person)  Thought Content:WDL  History of Schizophrenia/Schizoaffective disorder:Yes  Duration of Psychotic Symptoms:Greater than six months  Hallucinations:No data recorded Ideas of Reference:None  Suicidal Thoughts:No data recorded Homicidal  Thoughts:No data recorded  Sensorium  Memory:Immediate Fair; Recent Fair; Remote Fair  Judgment:Poor  Insight:Shallow   Executive Functions  Concentration:Fair  Attention Span:Fair  [place]   Psychomotor Activity  Psychomotor Activity:No data recorded  Assets  Assets:Housing; Physical Health; Leisure Time; Social Support   Sleep  Sleep:No data recorded   Physical Exam: Physical Exam Vitals and nursing note reviewed.  Constitutional:      Appearance: Normal appearance.  HENT:     Head: Normocephalic and atraumatic.     Mouth/Throat:     Pharynx: Oropharynx is clear.  Eyes:     Pupils: Pupils are equal, round, and reactive to light.  Cardiovascular:     Rate and Rhythm: Normal rate and regular rhythm.  Pulmonary:     Effort: Pulmonary effort is normal.     Breath sounds: Normal breath sounds.  Abdominal:     General: Abdomen is flat.     Palpations: Abdomen is soft.  Musculoskeletal:        General: Normal range of motion.  Skin:    General: Skin is warm and dry.  Neurological:     General: No focal deficit present.     Mental Status: He is alert. Mental status is at baseline.  Psychiatric:        Mood and Affect: Mood normal.        Thought Content: Thought content normal.   Review of Systems  Constitutional: Negative.   HENT: Negative.    Eyes: Negative.   Respiratory: Negative.    Cardiovascular: Negative.   Gastrointestinal: Negative.   Musculoskeletal: Negative.   Skin: Negative.   Neurological: Negative.   Psychiatric/Behavioral: Negative.    Blood pressure 105/75, pulse 86, temperature 98.1 F (36.7 C), temperature source Oral, resp. rate 17, height 5' 6.93" (1.7 m), weight 59 kg, SpO2 100 %. Body mass index is 20.4 kg/m.   Social History   Tobacco Use  Smoking Status Every Day   Packs/day: 1.00   Types: Cigarettes  Smokeless Tobacco Former   Tobacco Cessation:  A prescription for an  FDA-approved tobacco cessation medication was offered at discharge and the patient refused   Blood Alcohol level:  Lab Results  Component Value Date   [facility] <10 05/14/2021   ETH <10 [id number]    Metabolic Disorder Labs:  Lab Results  Component Value Date   HGBA1C 5.1 05/19/2021   MPG 100 05/19/2021   MPG 96.8 09/08/2019   Lab Results  Component Value Date   PROLACTIN 26.5 (H) 09/08/2019   Lab Results  Component Value Date   CHOL 106 05/19/2021   TRIG 68 05/19/2021   HDL 30 (L) 05/19/2021   CHOLHDL 3.5 05/19/2021   VLDL 14 05/19/2021   LDLCALC 62 05/19/2021   [place] 92 09/08/2019    See Psychiatric Specialty Exam and Suicide Risk Assessment completed by Attending Physician prior to discharge.  Discharge destination:  Home  Is patient on multiple antipsychotic therapies at discharge:  No   Has Patient had three or more failed trials of antipsychotic monotherapy by history:  No  Recommended Plan for Multiple Antipsychotic Therapies: NA  Discharge Instructions     Diet - low sodium heart healthy   Complete by: As directed    Increase activity slowly   Complete by: As directed       Allergies as of 05/23/2021       Reactions   Haldol [haloperidol] Other (See Comments)   Muscle spasms, involuntary muscle contractions        Medication List     STOP taking these medications    nicotine polacrilex 2 MG gum Commonly known as: NICORETTE       TAKE these medications      Indication  ARIPiprazole 20 MG tablet Commonly known as: ABILIFY Take 1 tablet (20 mg total) by mouth daily. What changed: when to take this  Indication: Schizophrenia   mirtazapine 15 MG tablet Commonly known as: REMERON Take 1 tablet (15 mg total) by mouth at bedtime.  Indication: Major Depressive Disorder   zolpidem 10 MG tablet Commonly known as: AMBIEN Take 1 tablet (10 mg total) by mouth daily.  Indication: Trouble Sleeping         Follow-up recommendations:  Prescriptions provided.  10-day supply requested as well.  Refer to local outpatient treatment in his county  Comments: See above  Signed: Alethia Berthold, MD 05/23/2021, 10:30 AM

## 2021-09-06 ENCOUNTER — Other Ambulatory Visit: Payer: Self-pay

## 2021-09-06 ENCOUNTER — Emergency Department (HOSPITAL_COMMUNITY)
Admission: EM | Admit: 2021-09-06 | Discharge: 2021-09-06 | Discharge status: Against Medical Advice | Payer: 59 | Attending: Emergency Medicine | Admitting: Emergency Medicine

## 2021-09-06 DIAGNOSIS — Z79899 Other long term (current) drug therapy: Secondary | ICD-10-CM | POA: Diagnosis not present

## 2021-09-06 DIAGNOSIS — G4700 Insomnia, unspecified: Secondary | ICD-10-CM | POA: Diagnosis not present

## 2021-09-06 DIAGNOSIS — Z5321 Procedure and treatment not carried out due to patient leaving prior to being seen by health care provider: Secondary | ICD-10-CM | POA: Diagnosis not present

## 2021-09-06 DIAGNOSIS — Y900 Blood alcohol level of less than 20 mg/100 ml: Secondary | ICD-10-CM | POA: Diagnosis not present

## 2021-09-06 LAB — CBC WITH DIFFERENTIAL/PLATELET
Abs Immature Granulocytes: 0.03 10*3/uL (ref 0.00–0.07)
Basophils Absolute: 0 10*3/uL (ref 0.0–0.1)
Basophils Relative: 1 %
Eosinophils Absolute: 0.1 10*3/uL (ref 0.0–0.5)
Eosinophils Relative: 1 %
HCT: 47 % (ref 39.0–52.0)
Hemoglobin: 16.5 g/dL (ref 13.0–17.0)
Immature Granulocytes: 1 %
Lymphocytes Relative: 25 %
Lymphs Abs: 1.6 10*3/uL (ref 0.7–4.0)
MCH: 30 pg (ref 26.0–34.0)
MCHC: 35.1 g/dL (ref 30.0–36.0)
MCV: 85.5 fL (ref 80.0–100.0)
Monocytes Absolute: 0.6 10*3/uL (ref 0.1–1.0)
Monocytes Relative: 10 %
Neutro Abs: 4.1 10*3/uL (ref 1.7–7.7)
Neutrophils Relative %: 62 %
Platelets: 205 10*3/uL (ref 150–400)
RBC: 5.5 MIL/uL (ref 4.22–5.81)
RDW: 13 % (ref 11.5–15.5)
WBC: 6.4 10*3/uL (ref 4.0–10.5)
nRBC: 0 % (ref 0.0–0.2)

## 2021-09-06 LAB — COMPREHENSIVE METABOLIC PANEL
ALT: 17 U/L (ref 0–44)
AST: 19 U/L (ref 15–41)
Albumin: 4.3 g/dL (ref 3.5–5.0)
Alkaline Phosphatase: 89 U/L (ref 38–126)
Anion gap: 10 (ref 5–15)
BUN: 9 mg/dL (ref 6–20)
CO2: 22 mmol/L (ref 22–32)
Calcium: 9.5 mg/dL (ref 8.9–10.3)
Chloride: 104 mmol/L (ref 98–111)
Creatinine, Ser: 0.79 mg/dL (ref 0.61–1.24)
GFR, Estimated: >60 mL/min (ref >60)
Glucose, Bld: 113 mg/dL — ABNORMAL HIGH (ref 70–99)
Potassium: 3.1 mmol/L — ABNORMAL LOW (ref 3.5–5.1)
Sodium: 136 mmol/L (ref 135–145)
Total Bilirubin: 1.3 mg/dL — ABNORMAL HIGH (ref 0.3–1.2)
Total Protein: 7.3 g/dL (ref 6.5–8.1)

## 2021-09-06 LAB — ETHANOL: Alcohol, Ethyl (B): <10 mg/dL (ref <10)

## 2021-09-06 NOTE — ED Triage Notes (Signed)
Pt comes in with mother with complaint of insomnia.  Pt has not slept in 2 days per mom.  Mother reports pt is harder to calm down.  Paces in and out of house and harder to redirect.  Pt has taken ambien  and melatonin with no sleep.  ? ?

## 2021-09-06 NOTE — ED Provider Triage Note (Signed)
Emergency Medicine Provider Triage Evaluation Note ? ?Brendan Hogan , a 26 y.o. male  was evaluated in triage.  Patient presents with mother with complaints of not sleeping for the past two days. Mom reports that they have tried Ambien, Olanzipine, and melatonin without much relief. They have not alerted his medical providers. She reports that he has been having some behavior problems lately.  ? ?Review of Systems  ?Positive:  ?Negative:  ? ?Physical Exam  ?BP (!) 130/97 (BP Location: Right Arm)   Pulse 81   Temp 98 ?F (36.7 ?C)   Resp 18   SpO2 98%  ?Gen:   Awake, no distress   ?Resp:  Normal effort  ?MSK:   Moves extremities without difficulty  ?Other:   ? ?Medical Decision Making  ?Medically screening exam initiated at 12:45 AM.  Appropriate orders placed.  Brendan Hogan was informed that the remainder of the evaluation will be completed by another provider, this initial triage assessment does not replace that evaluation, and the importance of remaining in the ED until their evaluation is complete. ? ?Will order basic labs. The patient has been seen for this issue multiple times before. ?  ?Sherrell Puller, PA-C ?09/06/21 [id number] ? ?

## 2021-09-06 NOTE — ED Notes (Signed)
Pt called 2x 

## 2021-09-06 NOTE — ED Notes (Signed)
Pt called 2x for vitals no answer 

## 2021-09-29 ENCOUNTER — Emergency Department (HOSPITAL_COMMUNITY)
Admission: EM | Admit: 2021-09-29 | Discharge: 2021-09-29 | Disposition: A | Discharge status: Home / Self Care | Payer: 59 | Attending: Emergency Medicine | Admitting: Emergency Medicine

## 2021-09-29 ENCOUNTER — Other Ambulatory Visit: Payer: Self-pay

## 2021-09-29 ENCOUNTER — Encounter (HOSPITAL_COMMUNITY): Payer: Self-pay

## 2021-09-29 DIAGNOSIS — R451 Restlessness and agitation: Secondary | ICD-10-CM | POA: Insufficient documentation

## 2021-09-29 DIAGNOSIS — G4700 Insomnia, unspecified: Secondary | ICD-10-CM | POA: Diagnosis present

## 2021-09-29 DIAGNOSIS — F5105 Insomnia due to other mental disorder: Secondary | ICD-10-CM | POA: Insufficient documentation

## 2021-09-29 MED ORDER — ZOLPIDEM TARTRATE 10 MG PO TABS: 10.0000 mg | ORAL_TABLET | Freq: Every day | ORAL | 0 refills | Status: DC

## 2021-09-29 MED ORDER — ZOLPIDEM TARTRATE 10 MG PO TABS
10.0000 mg | ORAL_TABLET | Freq: Every evening | ORAL | Status: DC | PRN
  Administered 2021-09-29: 10 mg via ORAL
  Filled 2021-09-29: qty 1

## 2021-09-29 NOTE — ED Triage Notes (Signed)
Patient accompanied by his mom and dad and they said he has not been able to sleep for 2 days. Not eating well. Takes ambien and melatonin, but they said it does not work. Said he does not feel anxious or stressed.

## 2021-09-29 NOTE — ED Provider Notes (Signed)
[facility] DEPT Provider Note   CSN: [id number] Arrival date & time: 09/29/21  0423     History  Chief Complaint  Patient presents with   Insomnia    THA Brendan Hogan is a 26 y.o. male.  HPI     This is a 26 year old male with a history of schizophrenia and insomnia who presents with insomnia.  He presents with his parents who are his legal guardians.  They report he has not slept in 2 nights.  Patient reports that he had 8 pills left of Ambien but "misplaced them."  Because of this, he has not had this in 2 nights.  He states he has been unable to sleep.  Denies racing thoughts, SI, HI.  Denies physical symptoms.  He states that when he tries to go to sleep he feels very restless.  Narcotic database reviewed.  Patient has a standing prescription and monthly refills of Ambien.  No other controlled substances noted.  Home Medications Prior to Admission medications   Medication Sig Start Date End Date Taking? Authorizing Provider  MELATONIN PO Take 1 tablet by mouth at bedtime as needed (sleep).   Yes [provider]  OLANZapine (ZYPREXA) 10 MG tablet Take 10 mg by mouth daily. 09/07/21  Yes [provider]  traZODone (DESYREL) 100 MG tablet Take 200 mg by mouth at bedtime. 09/03/21  Yes [provider]  ARIPiprazole (ABILIFY) 20 MG tablet Take 1 tablet (20 mg total) by mouth daily. Patient not taking: Reported on 09/29/2021 05/23/21   Clapacs, [name], MD  mirtazapine (REMERON) 15 MG tablet Take 1 tablet (15 mg total) by mouth at bedtime. Patient not taking: Reported on 09/29/2021 05/23/21   Clapacs, [name], MD  zolpidem (AMBIEN) 10 MG tablet Take 1 tablet (10 mg total) by mouth at bedtime. 09/29/21   Gloriana Piltz, Barbette Hair, MD      Allergies    Haldol [haloperidol]    Review of Systems   Review of Systems  Psychiatric/Behavioral:  Positive for sleep disturbance. Negative for hallucinations and suicidal ideas.   All other systems  reviewed and are negative.  Physical Exam Updated Vital Signs BP 131/84 (BP Location: Right Arm)   Pulse 81   Temp 98.2 F (36.8 C) (Oral)   Resp 16   Ht 1.727 m (5\' 8" )   Wt 63.5 kg   SpO2 100%   BMI 21.29 kg/m  Physical Exam Vitals and nursing note reviewed.  Constitutional:      Appearance: He is well-developed. He is not ill-appearing.  HENT:     Head: Normocephalic and atraumatic.  Eyes:     Pupils: Pupils are equal, round, and reactive to light.  Cardiovascular:     Rate and Rhythm: Normal rate and regular rhythm.     Heart sounds: Normal heart sounds. No murmur heard. Pulmonary:     Effort: Pulmonary effort is normal. No respiratory distress.     Breath sounds: Normal breath sounds. No wheezing.  Abdominal:     Palpations: Abdomen is soft.     Tenderness: There is no abdominal tenderness.  Musculoskeletal:     Cervical back: Neck supple.  Lymphadenopathy:     Cervical: No cervical adenopathy.  Skin:    General: Skin is warm and dry.  Neurological:     Mental Status: He is alert and oriented to person, place, and time.  Psychiatric:     Comments: Does not make eye contact    ED Results /  Procedures / Treatments   Labs (all labs ordered are listed, but only abnormal results are displayed) Labs Reviewed - No data to display  EKG None  Radiology No results found.  Procedures Procedures    Medications Ordered in ED Medications  zolpidem (AMBIEN) tablet 10 mg (has no administration in time range)    ED Course/ Medical Decision Making/ A&P                           Medical Decision Making Risk Prescription drug management.   This patient presents to the ED for concern of insomnia, this involves an extensive number of treatment options, and is a complaint that carries with it a high risk of complications and morbidity.  I considered the following differential and admission for this acute, potentially life threatening condition.  The differential  diagnosis includes primary insomnia, mania  MDM:    Patient presents with insomnia.  History of the same.  I reviewed the chart which reveals multiple presentations for the same.  He has an ongoing prescription for Ambien but states he has misplaced his pills and cannot get anymore until June 9.  He is overall nontoxic and vital signs are reassuring.  Family is not concerned about acute psychosis and he does not appear to be acutely psychotic or manic.  Patient was given 1 dose of Ambien here.  Given that it is the weekend and I am concerned that poor sleep hygiene may have him decompensate, I will give him 2 additional doses to get him through the weekend.  I have encouraged him to follow-up with his primary physician.  I do not feel any labs or imaging are indicated  (Labs, imaging, consults)  Labs: I Ordered, and personally interpreted labs.  The pertinent results include: None  Imaging Studies ordered: I ordered imaging studies including none I independently visualized and interpreted imaging. I agree with the radiologist interpretation  Additional history obtained from parents.  External records from outside source obtained and reviewed including prior evaluations and admissions to behavioral health  Cardiac Monitoring: The patient was maintained on a cardiac monitor.  I personally viewed and interpreted the cardiac monitored which showed an underlying rhythm of: Sinus rhythm  Reevaluation: After the interventions noted above, I reevaluated the patient and found that they have :stayed the same  Social Determinants of Health: History of schizophrenia  Disposition: Discharge  Co morbidities that complicate the patient evaluation  Past Medical History:  Diagnosis Date   Psychosis ([place]) 05/2019   [place]    Seasonal allergies      Medicines Meds ordered this encounter  Medications   zolpidem (AMBIEN) tablet 10 mg   zolpidem (AMBIEN) 10 MG tablet    Sig:  Take 1 tablet (10 mg total) by mouth at bedtime.    Dispense:  2 tablet    Refill:  0    I have reviewed the patients home medicines and have made adjustments as needed  Problem List / ED Course: Problem List Items Addressed This Visit   None Visit Diagnoses     Insomnia due to other mental disorder    -  Primary                   Final Clinical Impression(s) / ED Diagnoses Final diagnoses:  Insomnia due to other mental disorder    Rx / DC Orders ED Discharge Orders  Ordered    zolpidem (AMBIEN) 10 MG tablet  Daily at bedtime        09/29/21 0508              Merryl Hacker, MD 09/29/21 [phone]

## 2021-09-29 NOTE — Discharge Instructions (Signed)
You were seen today for insomnia.  You were given 1 dose of Ambien.  You will be sent a prescription for 2 additional doses.  Take this as needed for sleep.  Follow-up with your primary doctor for refills.

## 2021-09-29 NOTE — ED Notes (Signed)
Discharge instructions reviewed, questions answered. Rx education provided. Pt 's parents state understanding and no further questions. Pt ambulatory with steady gait upon discharge. No s/s of distress noted.

## 2021-10-21 ENCOUNTER — Emergency Department (HOSPITAL_COMMUNITY)
Admission: EM | Admit: 2021-10-21 | Discharge: 2021-10-21 | Disposition: A | Discharge status: Home / Self Care | Payer: 59 | Attending: Emergency Medicine | Admitting: Emergency Medicine

## 2021-10-21 ENCOUNTER — Encounter (HOSPITAL_COMMUNITY): Payer: Self-pay

## 2021-10-21 ENCOUNTER — Other Ambulatory Visit: Payer: Self-pay

## 2021-10-21 DIAGNOSIS — F10920 Alcohol use, unspecified with intoxication, uncomplicated: Secondary | ICD-10-CM | POA: Insufficient documentation
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
## 2021-12-26 ENCOUNTER — Other Ambulatory Visit: Payer: Self-pay

## 2021-12-26 ENCOUNTER — Emergency Department (HOSPITAL_COMMUNITY)
Admission: EM | Admit: 2021-12-26 | Discharge: 2021-12-28 | Disposition: A | Discharge status: Other Acute Inpatient Hospital | Payer: 59 | Attending: Emergency Medicine | Admitting: Emergency Medicine

## 2021-12-26 DIAGNOSIS — Z79899 Other long term (current) drug therapy: Secondary | ICD-10-CM | POA: Diagnosis not present

## 2021-12-26 DIAGNOSIS — F29 Unspecified psychosis not due to a substance or known physiological condition: Secondary | ICD-10-CM | POA: Insufficient documentation

## 2021-12-26 DIAGNOSIS — T23201A Burn of second degree of right hand, unspecified site, initial encounter: Secondary | ICD-10-CM | POA: Insufficient documentation

## 2021-12-26 DIAGNOSIS — X772XXA Intentional self-harm by other hot fluids, initial encounter: Secondary | ICD-10-CM | POA: Insufficient documentation

## 2021-12-26 DIAGNOSIS — F17210 Nicotine dependence, cigarettes, uncomplicated: Secondary | ICD-10-CM | POA: Insufficient documentation

## 2021-12-26 DIAGNOSIS — Z20822 Contact with and (suspected) exposure to covid-19: Secondary | ICD-10-CM | POA: Diagnosis not present

## 2021-12-26 DIAGNOSIS — S60812A Abrasion of left wrist, initial encounter: Secondary | ICD-10-CM | POA: Diagnosis not present

## 2021-12-26 DIAGNOSIS — S60811A Abrasion of right wrist, initial encounter: Secondary | ICD-10-CM | POA: Diagnosis not present

## 2021-12-26 DIAGNOSIS — S6991XA Unspecified injury of right wrist, hand and finger(s), initial encounter: Secondary | ICD-10-CM | POA: Diagnosis present

## 2021-12-26 DIAGNOSIS — F259 Schizoaffective disorder, unspecified: Secondary | ICD-10-CM | POA: Diagnosis not present

## 2021-12-26 LAB — COMPREHENSIVE METABOLIC PANEL
ALT: 13 U/L (ref 0–44)
AST: 22 U/L (ref 15–41)
Albumin: 4.5 g/dL (ref 3.5–5.0)
Alkaline Phosphatase: 85 U/L (ref 38–126)
Anion gap: 14 (ref 5–15)
BUN: 17 mg/dL (ref 6–20)
CO2: 19 mmol/L — ABNORMAL LOW (ref 22–32)
Calcium: 9.7 mg/dL (ref 8.9–10.3)
Chloride: 104 mmol/L (ref 98–111)
Creatinine, Ser: 1.24 mg/dL (ref 0.61–1.24)
GFR, Estimated: >60 mL/min (ref >60)
Glucose, Bld: 134 mg/dL — ABNORMAL HIGH (ref 70–99)
Potassium: 3.3 mmol/L — ABNORMAL LOW (ref 3.5–5.1)
Sodium: 137 mmol/L (ref 135–145)
Total Bilirubin: 1.7 mg/dL — ABNORMAL HIGH (ref 0.3–1.2)
Total Protein: 7.5 g/dL (ref 6.5–8.1)

## 2021-12-26 LAB — CBC WITH DIFFERENTIAL/PLATELET
Abs Immature Granulocytes: 0.03 10*3/uL (ref 0.00–0.07)
Basophils Absolute: 0.1 10*3/uL (ref 0.0–0.1)
Basophils Relative: 1 %
Eosinophils Absolute: 0.1 10*3/uL (ref 0.0–0.5)
Eosinophils Relative: 1 %
HCT: 44.5 % (ref 39.0–52.0)
Hemoglobin: 15.4 g/dL (ref 13.0–17.0)
Immature Granulocytes: 0 %
Lymphocytes Relative: 21 %
Lymphs Abs: 1.4 10*3/uL (ref 0.7–4.0)
MCH: 29.3 pg (ref 26.0–34.0)
MCHC: 34.6 g/dL (ref 30.0–36.0)
MCV: 84.8 fL (ref 80.0–100.0)
Monocytes Absolute: 0.8 10*3/uL (ref 0.1–1.0)
Monocytes Relative: 12 %
Neutro Abs: 4.4 10*3/uL (ref 1.7–7.7)
Neutrophils Relative %: 65 %
Platelets: 206 10*3/uL (ref 150–400)
RBC: 5.25 MIL/uL (ref 4.22–5.81)
RDW: 12 % (ref 11.5–15.5)
WBC: 6.8 10*3/uL (ref 4.0–10.5)
nRBC: 0 % (ref 0.0–0.2)

## 2021-12-26 LAB — RESP PANEL BY RT-PCR (FLU A&B, COVID) ARPGX2
Influenza A by PCR: NEGATIVE
Influenza B by PCR: NEGATIVE
SARS Coronavirus 2 by RT PCR: NEGATIVE

## 2021-12-26 LAB — ETHANOL: Alcohol, Ethyl (B): <10 mg/dL (ref <10)

## 2021-12-26 MED ORDER — ZOLPIDEM TARTRATE 5 MG PO TABS
10.0000 mg | ORAL_TABLET | Freq: Every day | ORAL | Status: DC
  Filled 2021-12-26: qty 2

## 2021-12-26 MED ORDER — ARIPIPRAZOLE 5 MG PO TABS
5.0000 mg | ORAL_TABLET | Freq: Every day | ORAL | Status: DC
  Administered 2021-12-26 – 2021-12-27 (×2): 5 mg via ORAL
  Filled 2021-12-26 (×2): qty 1

## 2021-12-26 MED ORDER — MIRTAZAPINE 15 MG PO TABS
15.0000 mg | ORAL_TABLET | Freq: Every day | ORAL | Status: DC
  Filled 2021-12-26: qty 1

## 2021-12-26 MED ORDER — ZIPRASIDONE MESYLATE 20 MG IM SOLR
20.0000 mg | Freq: Once | INTRAMUSCULAR | Status: AC
  Administered 2021-12-26: 20 mg via INTRAMUSCULAR
  Filled 2021-12-26: qty 20

## 2021-12-26 MED ORDER — LORAZEPAM 2 MG/ML IJ SOLN
2.0000 mg | Freq: Once | INTRAMUSCULAR | Status: AC
  Administered 2021-12-26: 2 mg via INTRAMUSCULAR
  Filled 2021-12-26: qty 1

## 2021-12-26 NOTE — ED Notes (Signed)
When checking to see if everything was in the patients folder I found that this patients IVC paperwork was not in the folder. Was able to locate after looking at a few different places. I have completed the first examination and placed three copies on the folder and the original in the red folder.
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
## 2021-12-26 NOTE — ED Notes (Signed)
TTS in process 
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
## 2021-12-26 NOTE — ED Notes (Signed)
Message sent to TTS that Pt is now awake.

## 2021-12-26 NOTE — ED Notes (Signed)
TC from Pt's Father for update. Visiting hours provided to Father.
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
## 2021-12-26 NOTE — ED Notes (Signed)
Pt sitting up in bed eating

## 2021-12-26 NOTE — BH Assessment (Signed)
Called patient's nurse Gabriel Rung, RN) and requested the TTS machine to be set up for patient's initial assessment.

## 2021-12-26 NOTE — BH Assessment (Signed)
@   1540, followed up with patient's nurse Magda Paganini, RN) to see if patient is available to participate in his initial TTS assessment. Per Epic notes, TTS attempted earlier and patient was noted as sleeping and was given Geodon. Clinician waiting on updates from nursing staff.

## 2021-12-26 NOTE — BH Assessment (Signed)
Per RN Magda Paganini, pt is asleep from being given Geodon. Cannot be assessed at this time.

## 2021-12-27 DIAGNOSIS — F250 Schizoaffective disorder, bipolar type: Secondary | ICD-10-CM

## 2021-12-27 MED ORDER — HYDROXYZINE HCL 25 MG PO TABS: 25.0000 mg | ORAL_TABLET | Freq: Three times a day (TID) | ORAL | Status: DC | PRN

## 2021-12-27 MED ORDER — ARIPIPRAZOLE 10 MG PO TABS
10.0000 mg | ORAL_TABLET | Freq: Every day | ORAL | Status: DC
  Filled 2021-12-27: qty 1

## 2021-12-27 MED ORDER — TRAZODONE HCL 50 MG PO TABS: 50.0000 mg | ORAL_TABLET | Freq: Every evening | ORAL | Status: DC | PRN

## 2021-12-27 NOTE — ED Notes (Signed)
Pt denies SI/HI at this time. Pt also denies AVH. Pt has been asleep this whole shift. Denies further needs

## 2021-12-27 NOTE — ED Notes (Signed)
TTS in progress 

## 2021-12-27 NOTE — Progress Notes (Signed)
Inpatient Behavioral Health Placement  Pt meets inpatient criteria per Ophelia Shoulder, NP. There are no available beds at [facility] per [facility] Rona Ravens, RN  Referral was sent to the following facilities;   Destination Service Provider Address Phone Fax  [facility]  [address], [place] [address] [phone]  [facility]  [address] [place] [address] [phone]  [facility]  [address] [place], [place] [address] [phone]  [facility]  [address] [place] [address] [phone]  [facility]  [address] [place], [place] [address] [phone]  [facility]  [address] [place] [address] [phone]  [facility]  [address], [place] [address] [phone]  [facility]  [address], [place] [address] [phone]  [facility]  [address], [place] [address] [phone]  [facility]  [address], [place] [address] [phone]  [facility]  [address] [place] [address] [phone]  [facility]  [address] [place] [address] [phone]  [name]  [address] [place] [address] [phone]    Situation ongoing,  CSW will follow up.   Maryjean Ka, MSW, [facility] 12/27/2021  @ 11:40 PM

## 2021-12-27 NOTE — ED Notes (Addendum)
Patient requesting zolpidem.  Patient informed that he did not have that ordered, that he had trazadone or atarax for sleep.  Patient then clenched fists and stormed out of room, yelling at staff and yelling at other patients.  Patient able to be redirected and went back to room.  Patient yelling in room, but is staying in room at this time. Patient stated he's not taking the remeron and "they need to quit f*&king with my meds."

## 2021-12-27 NOTE — ED Notes (Signed)
Patient's family came for visit. Asked patient if he wanted visitor and pt seemed confused, stated he did not want parents in his room, and asked if he could go out there. Explained that he could not. Pt asked to talk to parents. This nurse gave pt cordless phone and pt spoke to parents in native language. Pt then handed phone back and seemed exasperated. Informed parents they could call tomorrow to see if he is up for a visit then

## 2021-12-27 NOTE — ED Notes (Signed)
Patient came to nurses station and stated that he was ready for his meds.  Patient informed that he had the remeron he refused earlier as well as the trazadone for sleep.  Patient states "we will see."  Patient then went back into room and shut door.
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
## 2021-12-27 NOTE — ED Notes (Signed)
MD Countryman made aware of patient's request for ambien.

## 2021-12-27 NOTE — ED Provider Notes (Signed)
Emergency Medicine Observation Re-evaluation Note  Brendan Hogan is a 26 y.o. male, seen on rounds today.  Pt initially presented to the ED for complaints of psych eval/IVC Currently, the patient is stable.  Physical Exam  BP 101/68 (BP Location: Left Arm)   Pulse 86   Temp (!) 97.5 F (36.4 C) (Oral)   Resp 16   SpO2 98%  Physical Exam General: wdwn Cardiac: rrr Lungs: no distress Psych: sleeping  ED Course / MDM  EKG:EKG Interpretation  Date/Time:  Wednesday December 26 2021 07:59:22 EDT Ventricular Rate:  88 PR Interval:  130 QRS Duration: 89 QT Interval:  341 QTC Calculation: 413 R Axis:   33 Text Interpretation: Sinus rhythm Borderline repolarization abnormality When compared with ECG of 09/06/2021, No significant change was found Confirmed by Dione Booze ([id number]) on 12/26/2021 11:06:59 PM  I have reviewed the labs performed to date as well as medications administered while in observation.  Recent changes in the last 24 hours include patient evaluated by psych and advised they will reevaluate this am..  Plan  Current plan is for psych to reevaluate .    Margarita Grizzle, MD 12/27/21 [phone]

## 2021-12-28 LAB — RAPID URINE DRUG SCREEN, HOSP PERFORMED
Amphetamines: NOT DETECTED
Barbiturates: NOT DETECTED
Benzodiazepines: NOT DETECTED
Cocaine: NOT DETECTED
Opiates: NOT DETECTED
Tetrahydrocannabinol: NOT DETECTED

## 2021-12-28 NOTE — ED Notes (Signed)
[name] officers here to transport pt to [facility].

## 2021-12-28 NOTE — ED Notes (Signed)
[facility] was made of that the patient transfer to the facility will be delayed, because the date on the IVC paperwork need to be updated.

## 2021-12-28 NOTE — ED Notes (Signed)
Due to patient's agitation last night, vital signs deferred until patient wakes up.

## 2021-12-28 NOTE — ED Notes (Signed)
Pt uncooperative and will not take his medication. Pt refuses to put new arm band on either. Pt keeps pointing at the call light box in his room and saying to look at it when talking to patient about his medications. I explained to the pt that the call light box has nothing to do with him taking his medication. Pt still adamant about not taking his meds.

## 2021-12-28 NOTE — ED Notes (Signed)
Notify [facility] that [name] is here to pick patient up to take him to the facility.

## 2021-12-28 NOTE — ED Notes (Signed)
Called staffing for a sitter d/t pt IVC. Per staffing, "No one is available to sit at this time". Will continue to monitor pt. Per staffing, there may be a sitter available around 1100.

## 2021-12-28 NOTE — ED Notes (Signed)
Attempted to call report to [facility] x 1 and was told that they will not take report until transport is at [name] to transport pt. They then transferred [place] to their supervisor which then the phone was disconnected. Calling report again.

## 2021-12-28 NOTE — ED Notes (Signed)
Paperwork except EMTALA gathered. Notified [employer] operational officer to set up transport. Will complete EMTALA when transport arrives. Ophelia Shoulder, NP aware.

## 2021-12-28 NOTE — Progress Notes (Addendum)
Pt was accepted to [facility] TODAY 12/28/21; [facility]  Pt meets inpatient criteria per Ophelia Shoulder, NP  Attending Physician will be Joylene Igo, NP  Report can be called to: [phone] Today   Bed is ready NOW  Care Team notified: Ophelia Shoulder, NP, Denton Ar, RN, and [facility], RN  Kelton Pillar, LCSWA 12/28/2021 @ 12:06 PM

## 2021-12-28 NOTE — ED Notes (Signed)
Attempted to call [facility] to update on current pt status and IVC paperwork and was hung up on by staff.

## 2021-12-28 NOTE — ED Notes (Signed)
Attempted report again to [facility]. Per staff they will not take report until transport is physically at [facility]. I tried to explain that we have to give report to be able to fill out the EMTALA and that after I gave report, I would be calling transport. I spoke w/ Marcelino Duster RN initially who then transferred me to [facility] the supervisor. Notified Ophelia Shoulder NP of everything.

## 2021-12-28 NOTE — ED Notes (Signed)
[name] arrived and noted that 1st page of IVC paperwork had the wrong date on it compared to the date of 12-26-21 on the rest of the paperwork. Notified Ophelia Shoulder NP and Anitra Lauth MD about dilemma. Current IVC papers are being rescinded and redone.

## 2021-12-28 NOTE — ED Notes (Signed)
Per staffing a sitter is on their way from [facility]

## 2022-01-08 ENCOUNTER — Other Ambulatory Visit: Payer: Self-pay

## 2022-01-08 ENCOUNTER — Emergency Department (HOSPITAL_COMMUNITY): Payer: 59

## 2022-01-08 ENCOUNTER — Emergency Department (HOSPITAL_COMMUNITY)
Admission: EM | Admit: 2022-01-08 | Discharge: 2022-01-20 | Disposition: A | Discharge status: Home / Self Care | Payer: 59 | Attending: Emergency Medicine | Admitting: Emergency Medicine

## 2022-01-08 DIAGNOSIS — Z20822 Contact with and (suspected) exposure to covid-19: Secondary | ICD-10-CM | POA: Insufficient documentation

## 2022-01-08 DIAGNOSIS — F209 Schizophrenia, unspecified: Secondary | ICD-10-CM | POA: Insufficient documentation

## 2022-01-08 DIAGNOSIS — S0001XA Abrasion of scalp, initial encounter: Secondary | ICD-10-CM | POA: Insufficient documentation

## 2022-01-08 DIAGNOSIS — S40811A Abrasion of right upper arm, initial encounter: Secondary | ICD-10-CM | POA: Insufficient documentation

## 2022-01-08 DIAGNOSIS — F29 Unspecified psychosis not due to a substance or known physiological condition: Secondary | ICD-10-CM | POA: Insufficient documentation

## 2022-01-08 DIAGNOSIS — F17210 Nicotine dependence, cigarettes, uncomplicated: Secondary | ICD-10-CM | POA: Insufficient documentation

## 2022-01-08 DIAGNOSIS — F201 Disorganized schizophrenia: Secondary | ICD-10-CM | POA: Diagnosis present

## 2022-01-08 DIAGNOSIS — S40812A Abrasion of left upper arm, initial encounter: Secondary | ICD-10-CM | POA: Insufficient documentation

## 2022-01-08 DIAGNOSIS — Z046 Encounter for general psychiatric examination, requested by authority: Secondary | ICD-10-CM | POA: Insufficient documentation

## 2022-01-08 DIAGNOSIS — R440 Auditory hallucinations: Secondary | ICD-10-CM | POA: Insufficient documentation

## 2022-01-08 DIAGNOSIS — R441 Visual hallucinations: Secondary | ICD-10-CM | POA: Insufficient documentation

## 2022-01-08 DIAGNOSIS — D72829 Elevated white blood cell count, unspecified: Secondary | ICD-10-CM | POA: Insufficient documentation

## 2022-01-08 LAB — COMPREHENSIVE METABOLIC PANEL
ALT: 15 U/L (ref 0–44)
AST: 34 U/L (ref 15–41)
Albumin: 5.4 g/dL — ABNORMAL HIGH (ref 3.5–5.0)
Alkaline Phosphatase: 90 U/L (ref 38–126)
Anion gap: 12 (ref 5–15)
BUN: 14 mg/dL (ref 6–20)
CO2: 24 mmol/L (ref 22–32)
Calcium: 10.2 mg/dL (ref 8.9–10.3)
Chloride: 106 mmol/L (ref 98–111)
Creatinine, Ser: 1.23 mg/dL (ref 0.61–1.24)
GFR, Estimated: >60 mL/min (ref >60)
Glucose, Bld: 97 mg/dL (ref 70–99)
Potassium: 4.2 mmol/L (ref 3.5–5.1)
Sodium: 142 mmol/L (ref 135–145)
Total Bilirubin: 1.3 mg/dL — ABNORMAL HIGH (ref 0.3–1.2)
Total Protein: 8.5 g/dL — ABNORMAL HIGH (ref 6.5–8.1)

## 2022-01-08 LAB — CBC
HCT: 48.2 % (ref 39.0–52.0)
Hemoglobin: 16.3 g/dL (ref 13.0–17.0)
MCH: 29.3 pg (ref 26.0–34.0)
MCHC: 33.8 g/dL (ref 30.0–36.0)
MCV: 86.7 fL (ref 80.0–100.0)
Platelets: 214 10*3/uL (ref 150–400)
RBC: 5.56 MIL/uL (ref 4.22–5.81)
RDW: 12.5 % (ref 11.5–15.5)
WBC: 12.9 10*3/uL — ABNORMAL HIGH (ref 4.0–10.5)
nRBC: 0 % (ref 0.0–0.2)

## 2022-01-08 LAB — ETHANOL: Alcohol, Ethyl (B): <10 mg/dL (ref <10)

## 2022-01-08 LAB — ACETAMINOPHEN LEVEL: Acetaminophen (Tylenol), Serum: <10 ug/mL — ABNORMAL LOW (ref 10–30)

## 2022-01-08 LAB — RAPID URINE DRUG SCREEN, HOSP PERFORMED
Amphetamines: NOT DETECTED
Barbiturates: NOT DETECTED
Benzodiazepines: NOT DETECTED
Cocaine: NOT DETECTED
Opiates: NOT DETECTED
Tetrahydrocannabinol: NOT DETECTED

## 2022-01-08 LAB — SALICYLATE LEVEL: Salicylate Lvl: <7 mg/dL — ABNORMAL LOW (ref 7.0–30.0)

## 2022-01-08 LAB — RESP PANEL BY RT-PCR (FLU A&B, COVID) ARPGX2
Influenza A by PCR: NEGATIVE
Influenza B by PCR: NEGATIVE
SARS Coronavirus 2 by RT PCR: NEGATIVE

## 2022-01-08 MED ORDER — LORAZEPAM 1 MG PO TABS
1.0000 mg | ORAL_TABLET | ORAL | Status: DC | PRN
  Administered 2022-01-09 – 2022-01-12 (×6): 1 mg via ORAL
  Filled 2022-01-08 (×7): qty 1
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
## 2022-01-08 NOTE — BH Assessment (Signed)
TTS clinician attempted to complete assessment. Mary, RN, reported patient is asleep and unable to arouse due to agitation. Will reach out to TTS once patient is awake.
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
## 2022-01-08 NOTE — ED Notes (Signed)
Pt standing up at bed, and wandering off. Pt redirected to bed and security notified.
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
## 2022-01-09 DIAGNOSIS — F209 Schizophrenia, unspecified: Secondary | ICD-10-CM

## 2022-01-09 MED ORDER — STERILE WATER FOR INJECTION IJ SOLN
INTRAMUSCULAR | Status: AC
  Filled 2022-01-09: qty 10

## 2022-01-09 MED ORDER — LORAZEPAM 2 MG/ML IJ SOLN
2.0000 mg | Freq: Once | INTRAMUSCULAR | Status: AC
  Administered 2022-01-09: 2 mg via INTRAMUSCULAR
  Filled 2022-01-09: qty 1

## 2022-01-09 MED ORDER — ZIPRASIDONE MESYLATE 20 MG IM SOLR
20.0000 mg | Freq: Two times a day (BID) | INTRAMUSCULAR | Status: DC | PRN
  Administered 2022-01-09: 20 mg via INTRAMUSCULAR
  Filled 2022-01-09: qty 20

## 2022-01-09 MED ORDER — ARIPIPRAZOLE 10 MG PO TABS: 10.0000 mg | ORAL_TABLET | Freq: Every day | ORAL | Status: DC

## 2022-01-09 MED ORDER — MIRTAZAPINE 7.5 MG PO TABS
15.0000 mg | ORAL_TABLET | Freq: Every day | ORAL | Status: DC
  Administered 2022-01-10 – 2022-01-18 (×9): 15 mg via ORAL
  Filled 2022-01-09 (×10): qty 2

## 2022-01-09 MED ORDER — ACETAMINOPHEN 500 MG PO TABS
500.0000 mg | ORAL_TABLET | Freq: Four times a day (QID) | ORAL | Status: DC | PRN
  Administered 2022-01-09: 500 mg via ORAL
  Filled 2022-01-09: qty 1

## 2022-01-09 MED ORDER — TRAZODONE HCL 50 MG PO TABS
50.0000 mg | ORAL_TABLET | Freq: Every day | ORAL | Status: DC
  Administered 2022-01-10 – 2022-01-11 (×2): 50 mg via ORAL
  Filled 2022-01-09 (×2): qty 1

## 2022-01-09 MED ORDER — ARIPIPRAZOLE 10 MG PO TABS
10.0000 mg | ORAL_TABLET | Freq: Every day | ORAL | Status: DC
  Administered 2022-01-09 – 2022-01-10 (×2): 10 mg via ORAL
  Filled 2022-01-09 (×2): qty 1

## 2022-01-09 NOTE — Progress Notes (Signed)
Patient has been denied by [facility] due to no appropriate beds available. Patient meets BH inpatient criteria per Nira Conn, FNP. Patient has been faxed out to the following facilities:   [facility]  [address] [place] [place] [address] [phone]  [facility]  [address], [place] [address] [phone]  [facility]  [address] [place] [address] [phone]  [facility]  [address], [place] [address] [phone]  [facility]  [address] [place] [address] [phone]  [facility]  [address] [place] [address] [phone]  [facility]  [address] [place], [place] [address] [phone]  [facility]  [address] [place], [place] [address] [phone]  [facility]  [address] [place] [address] [phone]  [facility]  [address] [place] [address] [phone]  [facility]  [address], [place] [address] [phone]  [facility]  [address] [place] [address] [phone]   Damita Dunnings, MSW, LCSW-A  8:37 PM 01/09/2022

## 2022-01-09 NOTE — ED Notes (Signed)
Patient given water

## 2022-01-09 NOTE — ED Notes (Signed)
Patient to room 29.  Patient alert and cooperative at this time. Patient ambulatory to room..  Patient oriented to unit and room.
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
## 2022-01-09 NOTE — ED Notes (Signed)
First exam completed by Nira Conn Np on 01/09/2022.
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
## 2022-01-09 NOTE — ED Notes (Signed)
Patient given a sandwich and ate lunch. Patient was given water.
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
## 2022-01-11 DIAGNOSIS — F209 Schizophrenia, unspecified: Secondary | ICD-10-CM | POA: Diagnosis not present

## 2022-01-11 MED ORDER — ARIPIPRAZOLE 10 MG PO TABS
20.0000 mg | ORAL_TABLET | Freq: Every day | ORAL | Status: DC
  Administered 2022-01-11 – 2022-01-20 (×10): 20 mg via ORAL
  Filled 2022-01-11 (×10): qty 2

## 2022-01-11 NOTE — ED Notes (Signed)
Patient calm at this time.  Patient ate breakfast.  Patient medication compliant. Patient resting at this time.
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
## 2022-01-11 NOTE — ED Notes (Signed)
Patient resting comfortably
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
## 2022-01-11 NOTE — Progress Notes (Signed)
Inpatient Behavioral Health Placement  Meets inpatient criteria per Nira Conn, NP.  Referral was sent to the following facilities;   Destination Service Provider Address Phone [facility]  [address] [place] [place] [address] [phone]  [facility]  [address], [place] [address] [phone]  [facility]  [address] [place] [address] [phone]  [facility]  [address], [place] [address] [phone]  [facility]  [address], [place] [address] [phone]  [facility]  [address] [place] [address] [phone]  [facility]  [address] [place], [place] [address] [phone]  [facility]  [address] [place], [place] [address] [phone]  [facility]  [address] [place] [address] [phone]  [facility]  [address] [place] [address] [phone]  [facility]  [address] [place] [address] [phone]  [name]  [address], [place] [address] [phone]    Situation ongoing,  CSW will follow up.   Maryjean Ka, MSW, LCSWA 01/11/2022  @ 3:03 AM

## 2022-01-12 MED ORDER — MIRTAZAPINE 7.5 MG PO TABS
15.0000 mg | ORAL_TABLET | Freq: Once | ORAL | Status: AC
  Administered 2022-01-12: 15 mg via ORAL
  Filled 2022-01-12: qty 2

## 2022-01-12 MED ORDER — NICOTINE 21 MG/24HR TD PT24
21.0000 mg | MEDICATED_PATCH | Freq: Every day | TRANSDERMAL | Status: DC
  Administered 2022-01-12 – 2022-01-18 (×6): 21 mg via TRANSDERMAL
  Filled 2022-01-12 (×9): qty 1

## 2022-01-12 MED ORDER — ZOLPIDEM TARTRATE 5 MG PO TABS
10.0000 mg | ORAL_TABLET | Freq: Every evening | ORAL | Status: DC | PRN
  Administered 2022-01-12: 10 mg via ORAL
  Filled 2022-01-12: qty 2
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
## 2022-01-12 NOTE — Progress Notes (Signed)
Per Lindon Romp, NP, patient meets criteria for inpatient treatment. There are no available  beds at [facility] today. CSW re-faxed referrals to the following facilities for review:  [address] [place] [address] [phone] --  [place] [address], [place] [address] [phone] --  [place]  Pending - Request Sent N/A [place], [place] [address] [phone] --  [facility]  Pending - Request Sent N/A [place] [place] [address] [phone] --  [place] N/A [address] [place] [address] [phone] --  [place] N/A [address] [place] [address] [phone] --  [place] N/A [place], [place] [address] [phone] --  [facility]  Pending - Request Sent N/A [place], [place] [address] [phone] --  [place] [phone] [address] [place] [address] [place] --  [facility]  Pending - Request Sent N/A [address] [place] [address] [phone] --  [place] [address] [place] [address] [phone] --  [facility]  Pending - Request Sent N/A [address], [place] [address] [phone] --  [facility]  Pending - Request Sent N/A [address] [place] [address] [phone] --  [place] [address], [place] [address] [phone] --  [place] Sent N/A [address], [place] [address] [phone] --  [facility]  Pending - No Request Sent N/A [address] [place] [address] [phone] --  [facility]  Pending - No Request Sent N/A [address], [place] [address] [phone] --  [facility]  Pending - No Request Sent N/A [address], [place] [address] [phone] --  [facility]  Pending - No Request Sent N/A [address] [place] [address] [phone] --   TTS will continue to seek bed placement.  Glennie [place], MSW, [place] Phone: [phone] Disposition/TOC

## 2022-01-12 NOTE — Progress Notes (Signed)
Inpatient Behavioral Health Placement  Pt meets inpatient criteria per Lindon Romp, NP. There are no available beds at [facility] per [facility]. Referral was sent to the following facilities;   Destination Service Provider Address Phone [facility]  [place] [address] [phone]  [facility]  [address], [place] [address] [phone]  [place]  [place], [place] [address] [phone]  [facility]  [place] [address] [place]  [address] [place] [address] [phone] [place] [place] [address] [phone]  [facility]  [place], [place] [address] [phone]  [facility]  [address] [place], [place] [address] [phone]  [facility]  [address] [place] [address] [phone] [facility]  [place] [address] [place] [address] [phone] [facility]  [address] [place] [address] [phone]  [facility]  [address], [place] [address] [phone]  [facility] [address] [place] [address] [phone]  [facility]  [place] [address], [place] [address] [phone]  [facility]  [address], [place] [address] [phone]    Situation ongoing,  CSW will follow up.   Benjaman Kindler, MSW, LCSWA 01/12/2022  @ 2:48 AM

## 2022-01-12 NOTE — ED Notes (Signed)
Patient still not asleep and asking for more remeron for sleep. Notified Dr. Darl Householder. New one-time order for additional PO remeron. Will continue to monitor.

## 2022-01-12 NOTE — ED Notes (Signed)
Patient requesting nicotine patch. Notified Dr. Darl Householder. New order placed.

## 2022-01-12 NOTE — ED Notes (Signed)
Too early to give PRN PO ativan. Patient requesting another med to sleep. Notified Dr. Darl Householder. He stated okay to give ativan early. Will continue to monitor.

## 2022-01-12 NOTE — ED Notes (Signed)
Patient requested ambien instead of trazodone for sleep. Notified Dr. Darl Householder. Trazodone d/c'd and ambien ordered.

## 2022-01-12 NOTE — ED Notes (Signed)
IVC complete expires 9/19

## 2022-01-13 MED ORDER — RISPERIDONE 0.5 MG PO TBDP: 2.0000 mg | ORAL_TABLET | Freq: Three times a day (TID) | ORAL | Status: DC | PRN

## 2022-01-13 MED ORDER — ZOLPIDEM TARTRATE 5 MG PO TABS
5.0000 mg | ORAL_TABLET | Freq: Once | ORAL | Status: AC
  Administered 2022-01-13: 5 mg via ORAL
  Filled 2022-01-13: qty 1

## 2022-01-13 MED ORDER — ZIPRASIDONE MESYLATE 20 MG IM SOLR
20.0000 mg | INTRAMUSCULAR | Status: AC | PRN
  Administered 2022-01-16: 20 mg via INTRAMUSCULAR
  Filled 2022-01-13: qty 20

## 2022-01-13 MED ORDER — LORAZEPAM 1 MG PO TABS
1.0000 mg | ORAL_TABLET | ORAL | Status: AC | PRN
  Administered 2022-01-13: 1 mg via ORAL
  Filled 2022-01-13: qty 1

## 2022-01-13 MED ORDER — TRAZODONE HCL 100 MG PO TABS
100.0000 mg | ORAL_TABLET | Freq: Every evening | ORAL | Status: DC | PRN
  Administered 2022-01-16 – 2022-01-19 (×3): 100 mg via ORAL
  Filled 2022-01-13 (×4): qty 1
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
## 2022-01-13 NOTE — Progress Notes (Signed)
Per Jason Berry, NP, patient meets criteria for inpatient treatment. There are no available  beds at [facility] today. CSW re-faxed referrals to the following facilities for review:  [facility]  Pending - Request Sent N/A [address] [place] [address] [phone] --  [facility]  Pending - Request Sent N/A [address] [place] [address] [phone] --  [facility]  Pending - Request Sent N/A [address], [place] [address] [phone] --  [facility]  Pending - Request Sent N/A [address] [place] [address] [phone] --  [facility]  Pending - Request Sent N/A [address], [place] [address] [phone] --  [facility]  Pending - Request Sent N/A [address] [place] [address] [phone] --  [facility]  Pending - Request Sent N/A [address], [place] [address] [phone] --  [facility]  Pending - Request Sent N/A [address], [place] [address] [phone] --  [facility]  Pending - Request Sent N/A [address] [place] [address] [phone] --  [facility]  Pending - Request Sent N/A [address] [place] [address] [phone] --  [facility]  Pending - Request Sent N/A [address] [place] [address] [phone] --  [facility]  Pending - Request Sent N/A [address] [place] [address] [phone] --  [facility]  Pending - Request Sent N/A [address] [place] [address] [phone] --  [facility]  Pending - Request Sent  N/A [address] [place] [address] [phone] --  [facility]  Pending - Request Sent N/A [address] [place] [address] [phone] --  [facility]  Pending - No Request Sent N/A [address] [place] [address] [phone] --  [facility]  Pending - No Request Sent N/A [address] [place] [address] [phone] --  [facility]  Pending - No Request Sent N/A [address], [place] [address] [phone] --  [facility]  Pending - No Request Sent N/A [address], [place] [address] [phone] --   TTS will continue to seek bed placement.  Luisenrique Conran, MSW, LCSW-A, LCAS Phone: [phone] Disposition/TOC  

## 2022-01-14 MED ORDER — ZOLPIDEM TARTRATE 5 MG PO TABS
5.0000 mg | ORAL_TABLET | Freq: Once | ORAL | Status: AC
  Administered 2022-01-14: 5 mg via ORAL
  Filled 2022-01-14: qty 1

## 2022-01-14 NOTE — ED Notes (Signed)
Patient refused shower again this tech ask him x3
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
## 2022-01-14 NOTE — ED Notes (Signed)
Pt was getting a bit irritable with nurse because he did not have ambien ordered. He came very upset. ED MD put in a one time order. In the [facility] patient has trazodone ordered but the patient states he does not take that he takes [place].

## 2022-01-15 DIAGNOSIS — F209 Schizophrenia, unspecified: Secondary | ICD-10-CM | POA: Diagnosis not present

## 2022-01-15 MED ORDER — ZOLPIDEM TARTRATE 5 MG PO TABS
5.0000 mg | ORAL_TABLET | Freq: Once | ORAL | Status: AC
  Administered 2022-01-15: 5 mg via ORAL
  Filled 2022-01-15: qty 1

## 2022-01-15 NOTE — Progress Notes (Signed)
Inpatient Behavioral Health Placement  Pt meets inpatient criteria per Laretta Bolster, FNP. There are [facility] available beds at [facility] per [facility] Lynnda Shields, RN.   Referral was sent to the following facilities;    Destination Service Provider Address Phone [facility]  [place] [address] [phone]  [facility]  [address], [place] [address] [phone]  [place]  [place], [place] [address] [phone]  [facility]  [place] [address] [place]  [address] [place] [address] [phone] [address] [place] [address] [phone]  [facility]  [place], [place] [address] [phone]  [facility]  [address] [place], [place] [address] [phone]  [facility]  [address] [place] [address] [phone] [facility]  [place] [place] [address] [phone] [facility]  [address], [place] [address] [phone]  [facility]  [address], [place] [address] [phone]  [facility] [place] [address] [phone]  [facility]  [place] [address] [place] [address] [phone]  [facility]  [address] [place] [address] [phone] [facility]  [address] [place] [address] [phone] [address] [place] [address]  [phone]  [facility]  [address], [place] [address] [phone] [facility]  [address] [place] [address] [phone]  [facility]  [address] [place] [address] [place]  [facility]  [address] [place] [address] [phone] [facility]  [address] [place] [address] [phone]   Situation ongoing,  CSW will follow up.   Benjaman Kindler, MSW, LCSWA 01/15/2022  @ 7:00 PM

## 2022-01-15 NOTE — ED Notes (Signed)
Patient has been alert this shift.  Disorganized. Focused on leaving. Medication compliant.
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
## 2022-01-15 NOTE — ED Notes (Signed)
Patient sleep 8+ hours after stating that he needed Ambien and remeron.

## 2022-01-15 NOTE — ED Notes (Signed)
Patient doesn't want to take his trazodone for sleep. Requested ambien for sleep. Notified Dr. Francia Greaves. One-time order for ambien ordered and will give. Will continue to monitor.

## 2022-01-15 NOTE — Progress Notes (Signed)
Inpatient Behavioral Health Placement  Pt continues to meet inpatient criteria Vesta Mixer, NP. There are no available beds at [facility] per [facility]. Referral was sent to the following facilities;  Destination Service Provider Address Phone [facility]  [place] [address] [phone]  [facility]  [address], [place] [address] [phone]  [place], [place] [address] [phone]  [facility]  [place] [address] [place]  [address], [place] [address] [phone] [address] [place] [address] [phone]  [facility]  [place], [place] [address] [phone]  [facility]  [address] [place], [place] [address] [phone]  [facility]  [address] [place] [address] [phone] [facility]  [place] [place] [address] [phone] [facility]  [address], [place] [address] [phone]  [facility]  [address] [place] [address] [phone]  [facility] [address] [place] [address] [phone]  [facility]  [place] [address] [place] [address] [phone]  [facility]  [address], [place] [address] [phone] [facility]  [address] [place] [address] [phone] [place] [place] [address] [phone]  [facility]  [address], [place] [address] [phone] [facility]  [address] [place] [address] [place] [address] [phone]  [facility]  [address], [place] [address]       Situation ongoing,  CSW will follow up.   Benjaman Kindler, MSW, LCSWA 01/15/2022  @ 3:34 AM
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
## 2022-01-16 MED ORDER — NICOTINE POLACRILEX 2 MG MT GUM
2.0000 mg | CHEWING_GUM | OROMUCOSAL | Status: DC | PRN
  Filled 2022-01-16: qty 1

## 2022-01-16 MED ORDER — LORAZEPAM 2 MG/ML IJ SOLN: 2.0000 mg | Freq: Four times a day (QID) | INTRAMUSCULAR | Status: DC | PRN

## 2022-01-16 MED ORDER — STERILE WATER FOR INJECTION IJ SOLN
INTRAMUSCULAR | Status: AC
  Administered 2022-01-16: 1.2 mL
  Filled 2022-01-16: qty 10
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
## 2022-01-16 NOTE — ED Notes (Signed)
Patient requested nicotine gum but would not tell me how he smokes. Notified Dr. Doren Custard. New order for PRN nicotine gum. Patient fell asleep. Will continue to monitor.
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
## 2022-01-16 NOTE — ED Notes (Signed)
Patient has been calm and cooperative throughout shift. He rested comfortably after taking his HS medications and his one-time dose of ambien. He did take a shower before he went to sleep.

## 2022-01-16 NOTE — Progress Notes (Signed)
Patient has been denied by [facility] due to no appropriate beds available. Patient meets [place] inpatient criteria per Lindon Romp, NP. Patient has been faxed out to the following facilities:   [facility]  [address] [place] [address] [phone]  [facility]  [address] [employer] [place] [address] [phone] [place]  [place], [place] [address] [phone]  [facility]  [address], [place] [address] [phone]  [facility]  [address] [place] [address] [phone] [place]., [place] [address] [phone]  [facility]  [place], [place] [address] [place], [place] [address] [facility]  [address] [place] [address] [phone] [facility]  [address] [address] [place] [address] [phone] [facility]  [address] [place] [address] [phone]  [facility]  [address] [place] [address] [phone]  [facility] [address] [place] [address] [phone]  [facility]  [place] [address] [place] [address] [phone]  [facility]  [address] [place] [address] [phone] [facility]  [address] [place] [address] [phone] [address] [place] [address] [phone]  [facility]  [address],  [place] [address] [phone] [facility]  [address] [place] [address] [phone]  [facility]  [address] [place] [address] [place]  [facility]  [address] [place] [address] [phone] [facility]  [address] [place] [address] [place], MSW, LCSW-A  8:16 PM 01/16/2022

## 2022-01-16 NOTE — ED Notes (Addendum)
Pt is currently with his visitor.
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
## 2022-01-16 NOTE — ED Notes (Signed)
Pt given lunch tray.

## 2022-01-16 NOTE — ED Notes (Signed)
Pt was given dinner tray.  

## 2022-01-17 MED ORDER — STERILE WATER FOR INJECTION IJ SOLN
INTRAMUSCULAR | Status: AC
  Filled 2022-01-17: qty 10

## 2022-01-17 MED ORDER — LORAZEPAM 1 MG PO TABS
1.0000 mg | ORAL_TABLET | Freq: Once | ORAL | Status: AC
  Administered 2022-01-17: 1 mg via ORAL
  Filled 2022-01-17: qty 1

## 2022-01-17 MED ORDER — OLANZAPINE 10 MG IM SOLR
10.0000 mg | Freq: Two times a day (BID) | INTRAMUSCULAR | Status: DC | PRN
  Filled 2022-01-17: qty 10

## 2022-01-17 MED ORDER — OLANZAPINE 10 MG PO TBDP
10.0000 mg | ORAL_TABLET | Freq: Three times a day (TID) | ORAL | Status: DC | PRN
  Filled 2022-01-17 (×2): qty 1

## 2022-01-17 MED ORDER — ZOLPIDEM TARTRATE 5 MG PO TABS
5.0000 mg | ORAL_TABLET | Freq: Once | ORAL | Status: AC
  Administered 2022-01-17: 5 mg via ORAL
  Filled 2022-01-17: qty 1

## 2022-01-17 NOTE — ED Notes (Signed)
Pt has been calm, laying in his bed for most of this shift.
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
## 2022-01-17 NOTE — ED Notes (Signed)
Patient requesting Lorrin Mais for sleep instead of trazodone. Notified Dr. Roderic Palau. New order ambien and will give. Will continue to monitor.

## 2022-01-17 NOTE — ED Notes (Signed)
Pt refused dinner tray
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
## 2022-01-18 DIAGNOSIS — F209 Schizophrenia, unspecified: Secondary | ICD-10-CM | POA: Diagnosis not present

## 2022-01-18 NOTE — ED Notes (Signed)
Pt calm and cooperative from 7:00 AM to present.

## 2022-01-18 NOTE — Progress Notes (Signed)
Inpatient Behavioral Health Placement  Pt meets inpatient criteria per Lindon Romp, NP.There are no available beds at [facility] per [facility] Lavell Luster, RN. Referral was sent to the following facilities;   Destination Service Provider Address Phone [facility]  [place] [address] [phone]  [facility]  [address], [place] [address] [phone]  [place]  [place], [place] [address] [phone]  [facility]  [place] [address] [place]  [address], [place] [address] [phone] [place] [place] [address] [phone]  [facility]  [place], [place] [address] [phone]  [facility]  [address] [place], [place] [address] [phone]  [facility]  [address] [place] [address] [phone] [facility]  [address] [place] [address] [phone] [facility]  [address], [place] [address] [phone]  [facility]  [address] [place] [address] [phone]  [facility] [address] [place] [address] [phone]  [facility]  [place] [address] [place] [address] [phone]  [facility]  [address], [place] [address] [phone] [facility]  [address] [place] [address] [phone] [place] [place] [address]  [phone]  [facility]  [address], [place] [address] [phone] [facility]  [address] [place] [address] [phone]  [facility]  [address] [place] [address] [place]  [facility]  [address] [place] [address] [phone] [facility]  [address] [place] [address] [phone]    Situation ongoing,  CSW will follow up.   Benjaman Kindler, MSW, LCSWA 01/18/2022  @ 2:14 AM
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
## 2022-01-18 NOTE — ED Notes (Signed)
Patient calm and cooperative this shift.  

## 2022-01-18 NOTE — BH Assessment (Signed)
@  1716, Followed up with [facility]) and requested patient to be reviewed for inpatient treatment.

## 2022-01-18 NOTE — Progress Notes (Signed)
Patient came run ing out of his room making loud noise. Patient disrespectful to the staff.. Patient constantly tells the staff to leave him alone

## 2022-01-18 NOTE — BH Assessment (Addendum)
Inpatient Behavioral Health Placement   Pt meets inpatient criteria per Lindon Romp, NP. @1420 , Notified the [facility]  [facility] Alvira Philips) of patient's bed needs. Patient under review for admission to [facility]. Referrals were re-sent to the following facilities for consideration of placement.     Destination Service Provider Request Status Selected Services Address Phone Fax Patient Preferred  [place] [address] [phone] --  [place] [address] [place] [address] [phone] --  [facility]  Pending - Request Sent N/A [place], [place] [address] [phone] --  [facility]  Pending - Request Sent N/A [address] [place] [address] [phone] --  [place] N/A [address], [place] [address] [phone] --  [facility]  Pending - Request Sent N/A [address] [place] [address] [phone] --  [facility]  Pending - Request Sent N/A [place], [place] [address] [phone] --  [facility]  Pending - Request Sent N/A [address] [place] [address] [phone] --  Mobile [phone] [address] [place] [place] [address] [place] --  [facility]  Pending - Request Sent N/A [address] [place] [address] [phone] --  [place] [address] [place] [address] [phone] --  [facility]  Pending - Request Sent N/A [address] [place] [address]  [phone] --  [name]  Pending - Request Sent N/A [address] [place] [address] [phone] --  [place] [address], [place] [address] [phone] --  [place] Sent N/A [address], [place] [address] [phone] --  [place] N/A [address] [place] [address] [phone] --  [place] N/A [address] [place] [address] [phone] --  [facility]  Pending - Request Sent N/A [address], [place] [address] [phone] --  [facility]  Pending - Request Sent N/A [place], [place] [address] [phone] --  [facility]  Pending - Request Sent N/A [address] [place] [address] [phone] --  [facility]  Pending - Request Sent N/A [address] [place] [address] [phone] --  [facility]  Pending - Request Sent N/A [address], [place] [address] [phone] --

## 2022-01-18 NOTE — ED Notes (Signed)
Pt sitting or lying in bed 7:00 AM to 4:15 PM. Currently, pt standing in front of the mirror in room. Pt looking in the mirror, talking, and repeatedly punching in the air.
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
## 2022-01-18 NOTE — Progress Notes (Signed)
Patient has been denied by [facility] due to no appropriate beds available. Patient meets [place] inpatient criteria per Lindon Romp, NP. Patient has been faxed out to the following facilities:    [facility]  [address] [place] [address] [phone]  [facility]  [address] [place] [address] [phone] [place]  [place], [place] [address] [phone]  [facility]  [address], [place] [address] [phone]  [facility]  [address] [place] [address] [phone] [address] [place] [address] [phone]  [facility]  [place], [place] [address] [name], [place] [address] [facility]  [address] [place] [address] [phone] [facility]  [address] [address] [place] [address] [phone] [facility]  [address], [place] [address] [phone]  [facility]  [address] [place] [address] [phone]  [facility] [address] [place] [address] [phone]  [facility]  [place] [address], [place] [address] [phone]  [facility]  [address], [place] [address] [phone] [facility]  [address] [place] [address] [phone] [place]., [place] [address] [phone]  [facility]  [address],  [place] [address] [phone] [facility]  [address], [place] [address] [phone]  [facility]  [address], [place] [address] [place]  [facility]  [address] [place] [address] [phone] [facility]  [address] [place] [address] [phone] [place], MSW, LCSW-A  9:52 PM 01/18/2022

## 2022-01-19 MED ORDER — OLANZAPINE 5 MG PO TBDP
5.0000 mg | ORAL_TABLET | Freq: Every day | ORAL | Status: DC
  Administered 2022-01-19: 5 mg via ORAL
  Filled 2022-01-19: qty 1

## 2022-01-19 NOTE — ED Notes (Signed)
Pt declined zyprexa 10 mg PRN tablet.

## 2022-01-19 NOTE — ED Notes (Signed)
Patient resting quietly on bed in room with eyes closed.  No additional medication needed at this time.  Will continue to monitor

## 2022-01-19 NOTE — ED Notes (Signed)
Patient remains in bed with eyes closed.  Respirations even and unlabored.  Will continue to monitor
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
## 2022-01-19 NOTE — ED Notes (Signed)
Pt called this RN to his room to request ambien. Advised pt that this wasn't going to be an option. Pt tried to negotiate so that he could have [place]. Advised pt that we did have other options to help him sleep, Lorrin Mais was not one of those choices.

## 2022-01-19 NOTE — ED Notes (Signed)
Pt appeared anxious, paranoid, and delusional from 7 AM to present. Pt paced around his room and came to the door frequently. Pt declined drinks despite opening sealed drinks in front of him. Offered a nicotine patch and pt declined. Pt requested nicotine gum. Pt looked at the package suspiciously and declined the gum. Pt stated he did not receive medication yesterday despite taking the medication I administered. Around 1 PM, I looked at the pt, and pt snarled and growled at me. Pt cooperative and easily redirectable.

## 2022-01-19 NOTE — ED Notes (Signed)
I looked up from the nurses' station, and the pt stood at the window of his room and stared at me. The lights were off in his room. Five minutes later, pt appeared to loudly speak with someone in his room. Pt stated, "I'm not gay bro. That's a woman." Pt continued to speak with this someone who was not present.
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
## 2022-01-19 NOTE — ED Notes (Signed)
Patient observed resting quietly on bed with eyes closed.  No signs of distress noted.  Respirations even and unlabored.

## 2022-01-19 NOTE — ED Notes (Signed)
Patient out of room stating that he "has not slept all night."  States "I told you to get my medication and you didn't."  Pt was again informed that Ambien was not a scheduled or PRN medication.  Patient was monitored throughout night and was observed sleeping by tech and nurse.  Patient informed that he could have a different medication for agitation/anxiety.  Patient returned to room yelling.  Will continue to monitor

## 2022-01-19 NOTE — ED Notes (Signed)
Pt out of room demanding ambien. This RN reinforced that Lorrin Mais was not a PRN or scheduled med. Pt was offered IM ativan which he refused and was upset about. Pt became loud and was asked to return to room, pt did so without incident.

## 2022-01-19 NOTE — ED Notes (Signed)
Patient observed resting quietly on bed with eyes closed.   Respirations even and unlabored.

## 2022-01-19 NOTE — ED Notes (Signed)
Patient demanding to have Ambien for sleep.  Patient informed that medication was not ordered and that he could have other medications if needed.  Patient did not want other medication at this time.

## 2022-01-19 NOTE — Progress Notes (Signed)
Brendan Hogan ran out of his room. He looking around the room. Brendan Hogan began to move the trash can away from the window outside his room. Brendan Hogan goes back into the room. Brendan Hogan immediately comes out of the room a second time moved the trash can again. Before Brendan Hogan goes back inside the room Brendan Hogan looks around the room and slowly walk back inside his room. Writer moves the trash can to a different location inside the solid room. RN watch Brendan Hogan as he came outside the room.
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
## 2022-01-20 ENCOUNTER — Other Ambulatory Visit: Payer: Self-pay | Admitting: Psychiatry

## 2022-01-20 ENCOUNTER — Other Ambulatory Visit: Payer: Self-pay

## 2022-01-20 ENCOUNTER — Encounter (HOSPITAL_COMMUNITY): Payer: Self-pay | Admitting: Psychiatry

## 2022-01-20 ENCOUNTER — Inpatient Hospital Stay (HOSPITAL_COMMUNITY)
Admission: AD | Admit: 2022-01-20 | Discharge: 2022-02-08 | DRG: 885 | Disposition: A | Discharge status: Home / Self Care | Payer: Commercial Managed Care - HMO | Source: Intra-hospital | Attending: Psychiatry | Admitting: Psychiatry

## 2022-01-20 DIAGNOSIS — Z79899 Other long term (current) drug therapy: Secondary | ICD-10-CM

## 2022-01-20 DIAGNOSIS — F5101 Primary insomnia: Principal | ICD-10-CM

## 2022-01-20 DIAGNOSIS — Z716 Tobacco abuse counseling: Secondary | ICD-10-CM

## 2022-01-20 DIAGNOSIS — E7220 Disorder of urea cycle metabolism, unspecified: Secondary | ICD-10-CM | POA: Diagnosis not present

## 2022-01-20 DIAGNOSIS — Z9151 Personal history of suicidal behavior: Secondary | ICD-10-CM | POA: Diagnosis not present

## 2022-01-20 DIAGNOSIS — D72829 Elevated white blood cell count, unspecified: Secondary | ICD-10-CM | POA: Diagnosis present

## 2022-01-20 DIAGNOSIS — R17 Unspecified jaundice: Secondary | ICD-10-CM | POA: Diagnosis present

## 2022-01-20 DIAGNOSIS — Z8249 Family history of ischemic heart disease and other diseases of the circulatory system: Secondary | ICD-10-CM

## 2022-01-20 DIAGNOSIS — D72819 Decreased white blood cell count, unspecified: Secondary | ICD-10-CM | POA: Diagnosis not present

## 2022-01-20 DIAGNOSIS — Z5329 Procedure and treatment not carried out because of patient's decision for other reasons: Secondary | ICD-10-CM | POA: Diagnosis present

## 2022-01-20 DIAGNOSIS — K5900 Constipation, unspecified: Secondary | ICD-10-CM | POA: Diagnosis present

## 2022-01-20 DIAGNOSIS — Z885 Allergy status to narcotic agent status: Secondary | ICD-10-CM

## 2022-01-20 DIAGNOSIS — F209 Schizophrenia, unspecified: Secondary | ICD-10-CM | POA: Diagnosis present

## 2022-01-20 DIAGNOSIS — Z888 Allergy status to other drugs, medicaments and biological substances status: Secondary | ICD-10-CM | POA: Diagnosis not present

## 2022-01-20 DIAGNOSIS — G4700 Insomnia, unspecified: Secondary | ICD-10-CM | POA: Diagnosis present

## 2022-01-20 DIAGNOSIS — Z91148 Patient's other noncompliance with medication regimen for other reason: Secondary | ICD-10-CM

## 2022-01-20 DIAGNOSIS — F17210 Nicotine dependence, cigarettes, uncomplicated: Secondary | ICD-10-CM | POA: Diagnosis present

## 2022-01-20 DIAGNOSIS — F200 Paranoid schizophrenia: Principal | ICD-10-CM | POA: Diagnosis present

## 2022-01-20 DIAGNOSIS — F419 Anxiety disorder, unspecified: Secondary | ICD-10-CM | POA: Diagnosis present

## 2022-01-20 DIAGNOSIS — F201 Disorganized schizophrenia: Secondary | ICD-10-CM | POA: Diagnosis present

## 2022-01-20 LAB — SARS CORONAVIRUS 2 BY RT PCR: SARS Coronavirus 2 by RT PCR: NEGATIVE

## 2022-01-20 MED ORDER — OLANZAPINE 10 MG PO TBDP: 10.0000 mg | ORAL_TABLET | Freq: Three times a day (TID) | ORAL | Status: DC | PRN

## 2022-01-20 MED ORDER — ALUM & MAG HYDROXIDE-SIMETH 200-200-20 MG/5ML PO SUSP: 30.0000 mL | ORAL | Status: DC | PRN

## 2022-01-20 MED ORDER — ARIPIPRAZOLE 10 MG PO TABS
20.0000 mg | ORAL_TABLET | Freq: Every day | ORAL | Status: DC
  Administered 2022-01-21 – 2022-01-22 (×2): 20 mg via ORAL
  Filled 2022-01-20 (×4): qty 2

## 2022-01-20 MED ORDER — OLANZAPINE 5 MG PO TBDP
5.0000 mg | ORAL_TABLET | Freq: Every day | ORAL | Status: DC
  Administered 2022-01-20: 5 mg via ORAL
  Filled 2022-01-20 (×3): qty 1

## 2022-01-20 MED ORDER — ACETAMINOPHEN 325 MG PO TABS
650.0000 mg | ORAL_TABLET | Freq: Four times a day (QID) | ORAL | Status: DC | PRN
  Administered 2022-02-03: 650 mg via ORAL
  Filled 2022-01-20: qty 2

## 2022-01-20 MED ORDER — MAGNESIUM HYDROXIDE 400 MG/5ML PO SUSP: 30.0000 mL | Freq: Every day | ORAL | Status: DC | PRN

## 2022-01-20 MED ORDER — NICOTINE 21 MG/24HR TD PT24
21.0000 mg | MEDICATED_PATCH | Freq: Every day | TRANSDERMAL | Status: DC
  Administered 2022-01-22: 21 mg via TRANSDERMAL
  Filled 2022-01-20 (×4): qty 1

## 2022-01-20 MED ORDER — TRAZODONE HCL 100 MG PO TABS
100.0000 mg | ORAL_TABLET | Freq: Every evening | ORAL | Status: DC | PRN
  Administered 2022-01-20: 100 mg via ORAL
  Filled 2022-01-20 (×2): qty 1

## 2022-01-20 MED ORDER — MIRTAZAPINE 15 MG PO TABS
15.0000 mg | ORAL_TABLET | Freq: Every day | ORAL | Status: DC
  Administered 2022-01-20 – 2022-02-03 (×15): 15 mg via ORAL
  Filled 2022-01-20 (×17): qty 1

## 2022-01-20 MED ORDER — OLANZAPINE 10 MG IM SOLR: 10.0000 mg | Freq: Two times a day (BID) | INTRAMUSCULAR | Status: DC | PRN

## 2022-01-20 MED ORDER — HYDROXYZINE HCL 25 MG PO TABS
25.0000 mg | ORAL_TABLET | Freq: Three times a day (TID) | ORAL | Status: DC | PRN
  Administered 2022-01-21 – 2022-02-01 (×9): 25 mg via ORAL
  Filled 2022-01-20 (×12): qty 1

## 2022-01-20 MED ORDER — NICOTINE POLACRILEX 2 MG MT GUM
2.0000 mg | CHEWING_GUM | OROMUCOSAL | Status: DC | PRN
  Administered 2022-01-20 – 2022-02-08 (×22): 2 mg via ORAL
  Filled 2022-01-20 (×17): qty 1
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
## 2022-01-20 NOTE — Progress Notes (Signed)
   01/20/22 2200  Psych Admission Type (Psych Patients Only)  Admission Status Involuntary  Psychosocial Assessment  Patient Complaints None  Eye Contact Staring  Facial Expression Animated;Anxious  Affect Anxious;Preoccupied  Speech Soft;Tangential  Interaction Guarded;Cautious  Motor Activity Slow  Appearance/Hygiene In scrubs  Behavior Characteristics Cooperative;Appropriate to situation;Anxious;Guarded  Mood Anxious;Preoccupied  Thought Process  Coherency Disorganized  Content Paranoia;Preoccupation  Delusions None reported or observed  Perception Hallucinations  Hallucination Auditory  Judgment Impaired  Confusion None  Danger to Self  Current suicidal ideation? Denies

## 2022-01-20 NOTE — Progress Notes (Signed)
Date: 01/20/2022 Time: 6:30PM  Group Topic/Focus:  The focus of this group was to "break the ice" by throwing a ball around with different questions about the patient. The patient was engaged and identified different areas of strengths and weaknesses in themselves. They also identified coping skills.   Participation level: Did not attend  Participation quality: Did not attend  Affect: Did not attend  Cognitive: Did not attend  Insight: Did not attend  Engagement in group: Did not attend  Modes of Intervention: Did not attend  Luanna Salk RN 01/20/22 6:30PM

## 2022-01-20 NOTE — ED Notes (Signed)
Pt sleeping comfortably, respirations even and unlabored. Vitals not taken at this time so that pt may have uninterrupted sound sleep as he struggles with insomnia.

## 2022-01-20 NOTE — ED Notes (Signed)
Pt slept from 11:30 PM until 10:30 AM. Pt said he slept well and he needed the sleep "because I didn't sleep the night before."

## 2022-01-20 NOTE — Progress Notes (Signed)
Per Oneida Alar, NP, patient meets criteria for inpatient treatment. There are no available beds at [facility] today. CSW faxed referrals to the following facilities for review:  [place] [address] [phone] --  [place] [address], [place] [address] [phone] --  [place]  Pending - Request Sent N/A [place], [place] [address] [phone] --  [facility]  Pending - Request Sent N/A [place]., [place] [address] [phone] --  [place] N/A [address], [place] [address] [phone] --  [place] N/A [address], [place] [address] [phone] --  [facility]  Pending - Request Sent N/A [place], [place] [address] [phone] --  [facility]  Pending - Request Sent N/A [place], [place] [address] [phone] --  [place] [phone] [address] [place] [address] [place] --  [facility]  Pending - Request Sent N/A [address] [place] [address] [phone] --  [place] [address] [place] [address] [phone] --  [facility]  Pending - Request Sent N/A [address], [place] [address] [phone] --  [facility]  Pending - Request Sent N/A [address] [place] [address] [phone] --  [place] [address], [place] [address] [phone] --  [place] Sent N/A [address], [place] [address] [phone] --  [place] [address] [place] [address] [phone] --  [place] N/A [address], [place] [address] [phone] --  [facility]  Pending - Request Sent N/A [address] [place] [address] [phone] --  [facility]  Pending - Request Sent N/A [place], [place] [address] [phone] --  [facility]  Pending - Request Sent N/A [address] [place] Feinstein [place] [address] [phone] --  [facility]  Pending - Request Sent N/A [address] [place] [address] [phone] --  [facility]  Pending - Request Sent N/A [address] [place] [address] [phone] --  [facility]  Pending - Request Sent N/A [address] [place] [address] [phone] --  [place] [address] [place] [address] [phone] --   TTS will continue to seek bed placement.  Glennie Isle, MSW, [place] Phone: [phone] Disposition/TOC

## 2022-01-20 NOTE — Tx Team (Signed)
Initial Treatment Plan 01/20/2022 2:33 PM [name] FWY:[id number]    PATIENT STRESSORS: Other: Patient identifies no stressors. He states he has been off of his medications and "it was all blown out of proportion."     PATIENT STRENGTHS: Ability for insight  Supportive family/friends    PATIENT IDENTIFIED PROBLEMS: Patient reports he got off of his medications and "it was all blown out of proportion," Patient is not able to identify anything he needs to work on at this time.                      DISCHARGE CRITERIA:  Ability to meet basic life and health needs Adequate post-discharge living arrangements Improved stabilization in mood, thinking, and/or behavior  PRELIMINARY DISCHARGE PLAN: Return to previous living arrangement  PATIENT/FAMILY INVOLVEMENT: This treatment plan has been presented to and reviewed with the patient, Brendan Hogan.   The patient and family have been given the opportunity to ask questions and make suggestions.  Fritz Pickerel, RN 01/20/2022, 2:33 PM

## 2022-01-20 NOTE — Progress Notes (Signed)
Adult Psychoeducational Group Note  Date:  01/20/2022 Time:  8:47 PM  Group Topic/Focus:  Wrap-Up Group:   The focus of this group is to help patients review their daily goal of treatment and discuss progress on daily workbooks.  Participation Level:  Did Not Attend  Participation Quality:   Did Not Attend  Affect:   Did Not Attend  Cognitive:   Did Not Attend  Insight: None  Engagement in Group:   Did Not Attend  Modes of Intervention:   Did Not Attend  Additional Comments:  Pt was encouraged to attend wrap up group but did not attend.  Candy Sledge 01/20/2022, 8:47 PM

## 2022-01-20 NOTE — Progress Notes (Signed)
Per Oris Drone, [facility], pt has been accepted to [facility] bed 501-1. Accepting provider is Oneida Alar, NP, Attending provider is Dr. Winfred Leeds. Patient can arrive by 1430. Number for report is [phone].   Glennie Isle, MSW, LCSW-A Phone: [phone] Disposition/TOC
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
## 2022-01-20 NOTE — ED Notes (Signed)
Gave [name] pt belongings which include pt black sneakers, orange t-shirt, black shorts, and wallet.

## 2022-01-21 ENCOUNTER — Encounter (HOSPITAL_COMMUNITY): Payer: Self-pay

## 2022-01-21 DIAGNOSIS — F200 Paranoid schizophrenia: Secondary | ICD-10-CM | POA: Diagnosis not present

## 2022-01-21 MED ORDER — TRAZODONE HCL 100 MG PO TABS
200.0000 mg | ORAL_TABLET | Freq: Every evening | ORAL | Status: DC | PRN
  Administered 2022-01-21 – 2022-01-22 (×2): 200 mg via ORAL
  Filled 2022-01-21 (×2): qty 2

## 2022-01-21 NOTE — [facility] Suicide Risk Assessment (Signed)
[facility] Admission Suicide Risk Assessment   Nursing information obtained from:  Patient Demographic factors:  Male, Adolescent or young adult Current Mental Status:  NA Loss Factors:  NA Historical Factors:  NA Risk Reduction Factors:  Positive social support, Sense of responsibility to family, Positive coping skills or problem solving skills  Total Time spent with patient: 1 hour Principal Problem: Schizophrenia ([place]) Diagnosis:  Principal Problem:   Schizophrenia ([place])  Subjective Data: 26 years old male admitted after IVC for worsening psychosis and agitation, has diagnoses of schizophrenia but noncompliant with medications at home, ongoing psychosis, responding to stimuli, disorganized thought process  Continued Clinical Symptoms:  Alcohol Use Disorder Identification Test Final Score (AUDIT): 0 The "Alcohol Use Disorders Identification Test", Guidelines for Use in Primary Care, Second Edition.  World Pharmacologist [facility]). Score between 0-7:  no or low risk or alcohol related problems. Score between 8-15:  moderate risk of alcohol related problems. Score between 16-19:  high risk of alcohol related problems. Score 20 or above:  warrants further diagnostic evaluation for alcohol dependence and treatment.   CLINICAL FACTORS:   Schizophrenia:   Command hallucinatons Paranoid or undifferentiated type   Musculoskeletal: Strength & Muscle Tone: within normal limits Gait & Station: normal Patient leans: N/A  Psychiatric Specialty Exam:  Presentation  General Appearance: Bizarre; Other (comment) (poor grooming)  Eye Contact:Fair  Speech:Pressured  Speech Volume:Normal  Handedness:Right   Mood and Affect  Mood:Euthymic  Affect:Inappropriate   Thought Process  Thought Processes:Irrevelant  Descriptions of Associations:Loose  Orientation:Full (Time, Place and Person)  Thought Content:Illogical; Perseveration  History of Schizophrenia/Schizoaffective  disorder:Yes  Duration of Psychotic Symptoms:Greater than six months  Hallucinations:No data recorded Ideas of Reference:None  Suicidal Thoughts:No data recorded Homicidal Thoughts:No data recorded  Sensorium  Memory:Immediate Fair; Recent Fair; Remote Fair  Judgment:Impaired  Insight:Shallow   Executive Functions  Concentration:Fair  Attention Span:Fair  [place]   Psychomotor Activity  Psychomotor Activity:No data recorded  Assets  Assets:Resilience; Physical Health   Sleep  Sleep:No data recorded   Physical Exam: Physical Exam ROS Blood pressure 102/75, pulse 76, temperature 97.8 F (36.6 C), temperature source Oral, resp. rate 20, height 5\' 5"  (1.651 m), weight 59.3 kg, SpO2 100 %. Body mass index is 21.77 kg/m.   COGNITIVE FEATURES THAT CONTRIBUTE TO RISK:  Closed-mindedness and Loss of executive function    SUICIDE RISK:   Severe:  Frequent, intense, and enduring suicidal ideation, specific plan, no subjective intent, but some objective markers of intent (i.e., choice of lethal method), the method is accessible, some limited preparatory behavior, evidence of impaired self-control, severe dysphoria/symptomatology, multiple risk factors present, and few if any protective factors, particularly a lack of social support.  PLAN OF CARE: see H&P  I certify that inpatient services furnished can reasonably be expected to improve the patient's condition.   Aerith Canal Winfred Leeds, MD 01/21/2022, 2:39 PM

## 2022-01-21 NOTE — BH IP Treatment Plan (Signed)
Interdisciplinary Treatment and Diagnostic Plan Update  01/21/2022 Time of Session: 9:30am Brendan Hogan MRN: [id number]  Principal Diagnosis: Schizophrenia [facility])  Secondary Diagnoses: Principal Problem:   Schizophrenia ([place])   Current Medications:  Current Facility-Administered Medications  Medication Dose Route Frequency Provider Last Rate Last Admin   acetaminophen (TYLENOL) tablet 650 mg  650 mg Oral Q6H PRN Rozetta Nunnery, NP       alum & mag hydroxide-simeth (MAALOX/MYLANTA) 200-200-20 MG/5ML suspension 30 mL  30 mL Oral Q4H PRN Lindon Romp A, NP       ARIPiprazole (ABILIFY) tablet 20 mg  20 mg Oral Daily Lindon Romp A, NP   20 mg at 01/21/22 3845   hydrOXYzine (ATARAX) tablet 25 mg  25 mg Oral TID PRN Nwoko, Uchenna E, PA       magnesium hydroxide (MILK OF MAGNESIA) suspension 30 mL  30 mL Oral Daily PRN Lindon Romp A, NP       mirtazapine (REMERON) tablet 15 mg  15 mg Oral QHS Lindon Romp A, NP   15 mg at 01/20/22 2017   nicotine (NICODERM CQ - dosed in mg/24 hours) patch 21 mg  21 mg Transdermal Daily Lindon Romp A, NP       nicotine polacrilex (NICORETTE) gum 2 mg  2 mg Oral PRN Lindon Romp A, NP   2 mg at 01/20/22 1809   OLANZapine (ZYPREXA) injection 10 mg  10 mg Intramuscular Q12H PRN Rozetta Nunnery, NP       Or   OLANZapine zydis (ZYPREXA) disintegrating tablet 10 mg  10 mg Oral Q8H PRN Lindon Romp A, NP       OLANZapine zydis (ZYPREXA) disintegrating tablet 5 mg  5 mg Oral QHS Lindon Romp A, NP   5 mg at 01/20/22 2017   traZODone (DESYREL) tablet 100 mg  100 mg Oral QHS PRN Rozetta Nunnery, NP   100 mg at 01/20/22 2054   PTA Medications: Medications Prior to Admission  Medication Sig Dispense Refill Last Dose   ARIPiprazole (ABILIFY) 20 MG tablet Take 1 tablet (20 mg total) by mouth daily. (Patient not taking: Reported on 01/08/2022) 30 tablet 1    mirtazapine (REMERON) 15 MG tablet Take 1 tablet (15 mg total) by mouth at bedtime. (Patient not taking: Reported  on 01/08/2022) 30 tablet 1    traZODone (DESYREL) 50 MG tablet Take 50 mg by mouth at bedtime. (Patient not taking: Reported on 01/08/2022)      zolpidem (AMBIEN) 10 MG tablet Take 1 tablet (10 mg total) by mouth at bedtime. (Patient not taking: Reported on 01/08/2022) 2 tablet 0     Patient Stressors: Other: Patient identifies no stressors. He states he has been off of his medications and "it was all blown out of proportion."    Patient Strengths: Ability for insight  Supportive family/friends   Treatment Modalities: Medication Management, Group therapy, Case management,  1 to 1 session with clinician, Psychoeducation, Recreational therapy.   Physician Treatment Plan for Primary Diagnosis: Schizophrenia ([place]) Long Term Goal(s):     Short Term Goals:    Medication Management: Evaluate patient's response, side effects, and tolerance of medication regimen.  Therapeutic Interventions: 1 to 1 sessions, Unit Group sessions and Medication administration.  Evaluation of Outcomes: Not Met  Physician Treatment Plan for Secondary Diagnosis: Principal Problem:   Schizophrenia ([place])  Long Term Goal(s):     Short Term Goals:       Medication Management: Evaluate patient's response, side  effects, and tolerance of medication regimen.  Therapeutic Interventions: 1 to 1 sessions, Unit Group sessions and Medication administration.  Evaluation of Outcomes: Not Met   RN Treatment Plan for Primary Diagnosis: Schizophrenia ([place]) Long Term Goal(s): Knowledge of disease and therapeutic regimen to maintain health will improve  Short Term Goals: Ability to remain free from injury will improve, Ability to verbalize frustration and anger appropriately will improve, Ability to demonstrate self-control, Ability to participate in decision making will improve, Ability to verbalize feelings will improve, Ability to disclose and discuss suicidal ideas, Ability to identify and develop effective coping behaviors  will improve, and Compliance with prescribed medications will improve  Medication Management: RN will administer medications as ordered by provider, will assess and evaluate patient's response and provide education to patient for prescribed medication. RN will report any adverse and/or side effects to prescribing provider.  Therapeutic Interventions: 1 on 1 counseling sessions, Psychoeducation, Medication administration, Evaluate responses to treatment, Monitor vital signs and CBGs as ordered, Perform/monitor CIWA, COWS, AIMS and Fall Risk screenings as ordered, Perform wound care treatments as ordered.  Evaluation of Outcomes: Not Met   LCSW Treatment Plan for Primary Diagnosis: Schizophrenia ([place]) Long Term Goal(s): Safe transition to appropriate next level of care at discharge, Engage patient in therapeutic group addressing interpersonal concerns.  Short Term Goals: Engage patient in aftercare planning with referrals and resources, Increase social support, Increase ability to appropriately verbalize feelings, Increase emotional regulation, Facilitate acceptance of mental health diagnosis and concerns, Facilitate patient progression through stages of change regarding substance use diagnoses and concerns, Identify triggers associated with mental health/substance abuse issues, and Increase skills for wellness and recovery  Therapeutic Interventions: Assess for all discharge needs, 1 to 1 time with Social worker, Explore available resources and support systems, Assess for adequacy in community support network, Educate family and significant other(s) on suicide prevention, Complete Psychosocial Assessment, Interpersonal group therapy.  Evaluation of Outcomes: Not Met   Progress in Treatment: Attending groups: No. Participating in groups: No. Taking medication as prescribed: Yes. Toleration medication: Yes. Family/Significant other contact made: No, will contact:  patient has a guardian and CSW  will contact guardian Patient understands diagnosis: No. Discussing patient identified problems/goals with staff: No. Medical problems stabilized or resolved: Yes. Denies suicidal/homicidal ideation: Yes. Issues/concerns per patient self-inventory: No.   New problem(s) identified: No, Describe:  none reported  New Short Term/Long Term Goal(s):   medication stabilization, elimination of SI thoughts, development of comprehensive mental wellness plan.    Patient Goals:  Patient states that "I want to get some good rest"  Discharge Plan or Barriers: Patient recently admitted. CSW will continue to follow and assess for appropriate referrals and possible discharge planning.    Reason for Continuation of Hospitalization: Delusions  Hallucinations Medication stabilization  Estimated Length of Stay: 3-10 days  Last 3 [place] Suicide Severity Risk Score: Flowsheet Row Admission (Current) from 01/20/2022 in [place] [facility] ED from 01/08/2022 in [place] DEPT ED from 12/26/2021 in [place] No Risk Moderate Risk High Risk       Last PHQ 2/9 Scores:    07/19/2020    3:12 PM 05/29/2020    7:35 AM  Depression screen PHQ 2/9  Decreased Interest 1 0  Down, Depressed, Hopeless 1 1  PHQ - 2 Score 2 1  Altered sleeping 2   Tired, decreased energy 1   Change in appetite 1  Feeling bad or failure about yourself  1   Trouble concentrating 1   Moving slowly or fidgety/restless 0   Suicidal thoughts 0   PHQ-9 Score 8     Scribe for Treatment Team: Zachery Conch, LCSW 01/21/2022 11:43 AM

## 2022-01-21 NOTE — Progress Notes (Signed)
Adult Psychoeducational Group Note  Date:  01/21/2022 Time:  8:25 PM  Group Topic/Focus:  Wrap-Up Group:   The focus of this group is to help patients review their daily goal of treatment and discuss progress on daily workbooks.  Participation Level:  Did Not Attend  Participation Quality:   Did Not Attend  Affect:   Did Not Attend  Cognitive:   Did Not Attend  Insight: None  Engagement in Group:   Did Not Attend  Modes of Intervention:   Did Not Attend  Additional Comments:  Pt was encouraged to attend wrap up group but did not attend.  Candy Sledge 01/21/2022, 8:25 PM

## 2022-01-21 NOTE — Progress Notes (Signed)
   01/21/22 1100  Psych Admission Type (Psych Patients Only)  Admission Status Involuntary  Psychosocial Assessment  Patient Complaints None  Eye Contact Brief  Facial Expression Animated  Affect Preoccupied  Speech Tangential  Interaction Guarded  Motor Activity Slow  Appearance/Hygiene In scrubs  Behavior Characteristics Cooperative  Mood Preoccupied  Thought Process  Coherency Disorganized  Content Preoccupation;Paranoia  Delusions None reported or observed  Perception Hallucinations  Hallucination Auditory  Judgment Impaired  Confusion None  Danger to Self  Current suicidal ideation? Denies  Danger to Others  Danger to Others None reported or observed   Patient presents with animated affect and mood.  Patient observed talking to himself in his room and hallway with bizarre hand gestures.  Patient is cautious and paranoid with his food and drinks.  Medications given as prescribed.  Routine safety checks maintained.  Patient is safe on and off the unit.

## 2022-01-21 NOTE — Group Note (Signed)
  B[facility] LCSW Group Therapy Note  Date/Time:  01/21/2022 10:00am-11:00am or 11:00am-12:00pm  Type of Therapy and Topic:  Group Therapy:  Self-Care after Hospitalization  Participation Level:  Did Not Attend   Description of Group This process group involved patients discussing how they plan to take care of themselves in a better manner when they get home from the hospital.  The group started with patients listing one healthy self-care they hope to engage in at discharge that they did not use prior to admission.  We discussed a variety of other means of self-care which had a large range from hygiene activities to eating to setting boundaries to participating in peer support groups.  The primary focus by CSW was to point out commonalities.  When there were participants who stated everyone else can get help, but they themselves are "beyond help," this was discussed in detail and this belief was gently challenged.  Finally, it was announced that immediately following group was to be "Hygiene Hour" where everyone would go to their rooms and take care of their personal hygiene.  This was met with wide acceptance.  Therapeutic Goals Patient will identify and describe one self-care activity to deliberately plan to use upon hospital discharge Patient will participate in generating additional ideas about healthy self-care options when they return to the community Patients will be supportive of one another and receive support from others Patients will be challenged to realize that they are not "beyond help" any more than other participants in the room are  Summary of Patient Progress:  Did not attend   Therapeutic Modalities Brief [place], LCSW 01/21/2022, 2:26 PM
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
## 2022-01-21 NOTE — Progress Notes (Signed)
   01/21/22 2105  Psych Admission Type (Psych Patients Only)  Admission Status Involuntary  Psychosocial Assessment  Patient Complaints Restlessness;Anxiety  Eye Contact Brief;Intense;Suspiciousness  Facial Expression Animated  Affect Preoccupied  Training and development officer Guarded  Motor Activity Restless  Appearance/Hygiene Unremarkable  Behavior Characteristics Anxious;Irritable  Mood Preoccupied  Thought Process  Coherency Disorganized  Content Paranoia;Preoccupation  Delusions Paranoid  Perception Hallucinations  Hallucination Auditory  Judgment Limited  Confusion Mild  Danger to Self  Current suicidal ideation? Denies  Agreement Not to Harm Self Yes  Description of Agreement verbal contract for safety  Danger to Others  Danger to Others None reported or observed   D: Patient standing in the hallway preoccupied  about his medications talking about which ones he will take and not take. Pt observed talking to himself and responding to internal stimuli . A: Medications administered as prescribed. Support and encouragement provided as needed.  R: Patient remains safe on the unit. Plan of care ongoing for safety and stability.

## 2022-01-22 DIAGNOSIS — F200 Paranoid schizophrenia: Secondary | ICD-10-CM | POA: Diagnosis not present

## 2022-01-22 MED ORDER — OLANZAPINE 10 MG IM SOLR
5.0000 mg | Freq: Two times a day (BID) | INTRAMUSCULAR | Status: DC | PRN
  Administered 2022-01-22: 5 mg via INTRAMUSCULAR
  Filled 2022-01-22: qty 10

## 2022-01-22 MED ORDER — ZOLPIDEM TARTRATE 5 MG PO TABS
5.0000 mg | ORAL_TABLET | Freq: Every evening | ORAL | Status: DC | PRN
  Administered 2022-01-22 – 2022-01-25 (×4): 5 mg via ORAL
  Filled 2022-01-22 (×4): qty 1

## 2022-01-22 MED ORDER — OLANZAPINE 10 MG PO TBDP
10.0000 mg | ORAL_TABLET | Freq: Three times a day (TID) | ORAL | Status: DC | PRN
  Filled 2022-01-22: qty 1

## 2022-01-22 MED ORDER — ARIPIPRAZOLE 15 MG PO TABS
25.0000 mg | ORAL_TABLET | Freq: Every day | ORAL | Status: DC
  Administered 2022-01-23: 25 mg via ORAL
  Filled 2022-01-22 (×4): qty 1

## 2022-01-22 MED ORDER — DIPHENHYDRAMINE HCL 50 MG/ML IJ SOLN
INTRAMUSCULAR | Status: AC
  Filled 2022-01-22: qty 1

## 2022-01-22 MED ORDER — LORAZEPAM 1 MG PO TABS
2.0000 mg | ORAL_TABLET | ORAL | Status: DC | PRN
  Filled 2022-01-22: qty 2

## 2022-01-22 MED ORDER — LORAZEPAM 2 MG/ML IJ SOLN
2.0000 mg | INTRAMUSCULAR | Status: DC | PRN
  Administered 2022-01-22 – 2022-01-24 (×3): 2 mg via INTRAMUSCULAR
  Filled 2022-01-22 (×2): qty 1

## 2022-01-22 NOTE — Progress Notes (Signed)
L arm removed from restraint, pt in 2 point restraint
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
## 2022-01-22 NOTE — Group Note (Unsigned)
Date:  01/22/2022 Time:  11:02 AM  Group Topic/Focus:  Goals Group:   The focus of this group is to help patients establish daily goals to achieve during treatment and discuss how the patient can incorporate goal setting into their daily lives to aide in recovery. Orientation:   The focus of this group is to educate the patient on the purpose and policies of crisis stabilization and provide a format to answer questions about their admission.  The group details unit policies and expectations of patients while admitted.     Participation Level:  {BHH PARTICIPATION LEVEL:22264}  Participation Quality:  {BHH PARTICIPATION QUALITY:22265}  Affect:  {BHH AFFECT:22266}  Cognitive:  {BHH COGNITIVE:22267}  Insight: {BHH Insight2:20797}  Engagement in Group:  {BHH ENGAGEMENT IN GROUP:22268}  Modes of Intervention:  {BHH MODES OF INTERVENTION:22269}  Additional Comments:  ***  Brendan Hogan Brendan Hogan Brendan Hogan Brendan Hogan 01/22/2022, 11:02 AM  

## 2022-01-22 NOTE — Progress Notes (Signed)
Note: Call placed to father several times but unable to reach him.

## 2022-01-22 NOTE — Progress Notes (Signed)
Pt removed from 2  point restraints at 2058. Pt sitting on the bed having VS assessed. Pt is cooperative.
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
## 2022-01-22 NOTE — Progress Notes (Signed)
Adult Psychoeducational Group Note  Date:  01/22/2022 Time:  9:40 PM  Group Topic/Focus:  Wrap-Up Group:   The focus of this group is to help patients review their daily goal of treatment and discuss progress on daily workbooks.  Participation Level:  Did Not Attend  Participation Quality:   Did Not Attend  Affect:   Did Not Attend  Cognitive:   Did Not Attend  Insight: None  Engagement in Group:  Did Not Attend  Modes of Intervention:   Did Not Attend  Additional Comments:  Pt was encouraged to attend wrap up group but did not attend.  Candy Sledge 01/22/2022, 9:40 PM
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
## 2022-01-22 NOTE — BHH Counselor (Signed)
CSW attempted to complete assessment with patient. Patient stated that he already spoke with someone today and did not want to speak with anyone else.  CSW shared that she would come after lunch to see if patient would be more interested in speaking with her.  CSW will try to assess patient after lunch.  This is the 2nd attempt at completing an assessment for patient.    Nicol Herbig, LCSW, [place] Social Worker  [facility]
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
## 2022-01-22 NOTE — Plan of Care (Signed)
Notified by SW that first opinion and affidavit expired after being completed prior to admission and needed to be done over and resent to [employer]. I have completed First opinion and affidavit since we cannot reach guardian to sign patient in for voluntary treatment.   Viann Fish, MD, [facility]

## 2022-01-22 NOTE — Progress Notes (Signed)
Nursing 1:1 note D:Pt observed laying in bed with eyes closed but not asleep. RR even and unlabored. No distress noted.Pt used the bathroom at 2200 A: 1:1 observation continues for safety  R: Pt remains safe
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
## 2022-01-23 DIAGNOSIS — F200 Paranoid schizophrenia: Secondary | ICD-10-CM | POA: Diagnosis not present

## 2022-01-23 LAB — BASIC METABOLIC PANEL
Anion gap: 9 (ref 5–15)
BUN: 8 mg/dL (ref 6–20)
CO2: 28 mmol/L (ref 22–32)
Calcium: 9.8 mg/dL (ref 8.9–10.3)
Chloride: 103 mmol/L (ref 98–111)
Creatinine, Ser: 0.76 mg/dL (ref 0.61–1.24)
GFR, Estimated: >60 mL/min (ref >60)
Glucose, Bld: 86 mg/dL (ref 70–99)
Potassium: 3.8 mmol/L (ref 3.5–5.1)
Sodium: 140 mmol/L (ref 135–145)

## 2022-01-23 LAB — CBC WITH DIFFERENTIAL/PLATELET
Abs Immature Granulocytes: 0.01 10*3/uL (ref 0.00–0.07)
Basophils Absolute: 0.1 10*3/uL (ref 0.0–0.1)
Basophils Relative: 1 %
Eosinophils Absolute: 0.1 10*3/uL (ref 0.0–0.5)
Eosinophils Relative: 1 %
HCT: 45.6 % (ref 39.0–52.0)
Hemoglobin: 14.9 g/dL (ref 13.0–17.0)
Immature Granulocytes: 0 %
Lymphocytes Relative: 28 %
Lymphs Abs: 1.7 10*3/uL (ref 0.7–4.0)
MCH: 28.9 pg (ref 26.0–34.0)
MCHC: 32.7 g/dL (ref 30.0–36.0)
MCV: 88.5 fL (ref 80.0–100.0)
Monocytes Absolute: 0.5 10*3/uL (ref 0.1–1.0)
Monocytes Relative: 8 %
Neutro Abs: 3.8 10*3/uL (ref 1.7–7.7)
Neutrophils Relative %: 62 %
Platelets: 208 10*3/uL (ref 150–400)
RBC: 5.15 MIL/uL (ref 4.22–5.81)
RDW: 12.7 % (ref 11.5–15.5)
WBC: 6 10*3/uL (ref 4.0–10.5)
nRBC: 0 % (ref 0.0–0.2)

## 2022-01-23 LAB — HEPATIC FUNCTION PANEL
ALT: 15 U/L (ref 0–44)
AST: 34 U/L (ref 15–41)
Albumin: 4.6 g/dL (ref 3.5–5.0)
Alkaline Phosphatase: 89 U/L (ref 38–126)
Bilirubin, Direct: 0.1 mg/dL (ref 0.0–0.2)
Indirect Bilirubin: 0.6 mg/dL (ref 0.3–0.9)
Total Bilirubin: 0.7 mg/dL (ref 0.3–1.2)
Total Protein: 7.8 g/dL (ref 6.5–8.1)

## 2022-01-23 LAB — HEMOGLOBIN A1C
Hgb A1c MFr Bld: 4.8 % (ref 4.8–5.6)
Mean Plasma Glucose: 91.06 mg/dL

## 2022-01-23 LAB — TSH: TSH: 3.008 u[IU]/mL (ref 0.350–4.500)

## 2022-01-23 LAB — LIPID PANEL
Cholesterol: 151 mg/dL (ref 0–200)
HDL: 49 mg/dL (ref >40)
LDL Cholesterol: 91 mg/dL (ref 0–99)
Total CHOL/HDL Ratio: 3.1 RATIO
Triglycerides: 53 mg/dL (ref <150)
VLDL: 11 mg/dL (ref 0–40)

## 2022-01-23 MED ORDER — DIPHENHYDRAMINE HCL 50 MG/ML IJ SOLN
50.0000 mg | Freq: Once | INTRAMUSCULAR | Status: AC
  Filled 2022-01-23: qty 1

## 2022-01-23 MED ORDER — DIVALPROEX SODIUM 500 MG PO DR TAB
500.0000 mg | DELAYED_RELEASE_TABLET | Freq: Two times a day (BID) | ORAL | Status: DC
  Administered 2022-01-23 – 2022-01-27 (×9): 500 mg via ORAL
  Filled 2022-01-23 (×12): qty 1

## 2022-01-23 MED ORDER — ARIPIPRAZOLE 10 MG PO TABS
10.0000 mg | ORAL_TABLET | Freq: Every day | ORAL | Status: AC
  Administered 2022-01-25: 10 mg via ORAL
  Filled 2022-01-23: qty 1

## 2022-01-23 MED ORDER — RISPERIDONE 1 MG PO TABS
1.0000 mg | ORAL_TABLET | Freq: Two times a day (BID) | ORAL | Status: DC
  Administered 2022-01-23 – 2022-01-24 (×3): 1 mg via ORAL
  Filled 2022-01-23 (×6): qty 1

## 2022-01-23 MED ORDER — ARIPIPRAZOLE 10 MG PO TABS
20.0000 mg | ORAL_TABLET | Freq: Every day | ORAL | Status: AC
  Administered 2022-01-24: 20 mg via ORAL
  Filled 2022-01-23: qty 2
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
## 2022-01-23 NOTE — Progress Notes (Signed)
D- Patient remained in the unit for lunch. Patient was restless and pacing the hall. Patient is intrusive, asking this RN about where she lives and where she went to school multiple times.  A- Patient given PRN nicorette gum per patient request. Staff reinforced unit rules and boundaries. Emotional support and reassurance provided. Patient remains on a 1:1 observation with MHT staff.   R- Patient compliant with medications and treatment plan at this time. Patient is restless and exhibits paranoid behaviors. Patient is redirectable at this time. Patient remains safe at this time.

## 2022-01-23 NOTE — Progress Notes (Signed)
   01/23/22 0545  Sleep  Number of Hours 6.5
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
## 2022-01-23 NOTE — Progress Notes (Addendum)
D- Patient is sitting in dayroom with 1:1 sitter. Patient presented restless and anxious with paranoia, hypervigilance, and was preoccupied. Patient was clenching is fists and was pacing and sitting for only short periods of time. Eyes were darting back and forth. Patient exhibiting argumentative behaviors. He requested that the drinks be opened in front of him. Patient denies SI/HI/AVH/pain. Patient stated that he slept well and had a good appetite at breakfast  A- PRN ativan 2 mg given IM as ordered for anxiety/agitation with minimal effect when reassessed at 0905. Verbal education on current treatment regiment provided. His concerns were validated. This RN provided emotional support and reassurance to patient. 1:1 assigned, MHT staff in attendance at all times. Patient started on Depakote and Risperdal, first dose given this shift.   R- No adverse drug reactions noted. Patient contracts for safety at this time. Patient compliant with medications and treatment plan thus far. Patient observed in dayroom attending group with the right therapies. Patient is redirectable at this time, but continues to need multiple verbal prompts to comply with unit routines and care.

## 2022-01-23 NOTE — Progress Notes (Addendum)
Patient is still suspicious and hypervigilant. He required multiple prompts and encouragements for evening labs. Refused EKG on 3 separate approaches. Will inform incoming shift.

## 2022-01-23 NOTE — Group Note (Signed)
LCSW Group Therapy Note  Group Date: 01/23/2022 Start Time: 1300 End Time: 1400   Type of Therapy and Topic:  Group Therapy: Anger Cues and Responses  Participation Level:  Did Not Attend   Description of Group:   In this group, patients learned how to recognize the physical, cognitive, emotional, and behavioral responses they have to anger-provoking situations.  They identified a recent time they became angry and how they reacted.  They analyzed how their reaction was possibly beneficial and how it was possibly unhelpful.  The group discussed a variety of healthier coping skills that could help with such a situation in the future.  Focus was placed on how helpful it is to recognize the underlying emotions to our anger, because working on those can lead to a more permanent solution as well as our ability to focus on the important rather than the urgent.  Therapeutic Goals: Patients will remember their last incident of anger and how they felt emotionally and physically, what their thoughts were at the time, and how they behaved. Patients will identify how their behavior at that time worked for them, as well as how it worked against them. Patients will explore possible new behaviors to use in future anger situations. Patients will learn that anger itself is normal and cannot be eliminated, and that healthier reactions can assist with resolving conflict rather than worsening situations.  Summary of Patient Progress:  Did not attend Therapeutic Modalities:   Cognitive [place], LCSW 01/23/2022  1:43 PM

## 2022-01-23 NOTE — Progress Notes (Signed)
   01/23/22 1950  Psych Admission Type (Psych Patients Only)  Admission Status Involuntary  Psychosocial Assessment  Patient Complaints Restlessness;Suspiciousness  Eye Contact Fair  Facial Expression Anxious  Affect Anxious  Speech Pressured;Argumentative  Interaction Defensive  Motor Activity Restless;Fidgety  Appearance/Hygiene Unremarkable;In scrubs  Behavior Characteristics Cooperative;Anxious;Fidgety;Restless  Mood Suspicious;Labile  Thought Process  Coherency Disorganized;Tangential  Content Paranoia  Delusions Paranoid  Perception WDL  Hallucination None reported or observed  Judgment Impaired  Confusion Mild  Danger to Self  Current suicidal ideation? Denies  Danger to Others  Danger to Others None reported or observed   Progress note   D: Pt seen in his room with sitter. Pt denies SI, HI, AVH. Pt rates pain  0/10. Pt rates anxiety  0/10 and depression  0/10. Pt is restless. Pt is suspicious. Doesn't want sitter to be too close to him. Pt wants medication now but told when medication time would be. Pt accepted this. Pt refused EKG today per staff but did consent to having blood drawn. No other issues noted at this time.  A: Pt provided support and encouragement. Pt given scheduled medication as prescribed. PRNs as appropriate. Q15 min checks for safety.   R: Pt safe on the unit. Will continue to monitor.

## 2022-01-23 NOTE — Progress Notes (Signed)
Recreation Therapy Notes  INPATIENT RECREATION THERAPY ASSESSMENT  Patient Details Name: Brendan Hogan MRN: [id number] DOB: 1995/08/20 Today's Date: 01/23/2022       Information Obtained From: Patient  Able to Participate in Assessment/Interview: Yes  Patient Presentation: Alert  Reason for Admission (Per Patient): Med Non-Compliance  Patient Stressors: Other (Comment) (None identified)  Coping Skills:   Sports, Exercise, Talk  Leisure Interests (2+):  Individual - TV, Individual - Phone, Nature - Other (Comment) (Be outside)  Frequency of Recreation/Participation: Other (Comment) ("it's been a minute")  Awareness of Community Resources:  No  [place] of Residence:  [place]  Patient Main Form of Transportation: Taxi  Patient Strengths:  Scientist, research (physical sciences)  Patient Identified Areas of Improvement:  "how I react to things" improve social skills  Patient Goal for Hospitalization:  "I accomplished all my goals, I'm ready to discharge"  Current SI (including self-harm):  No  Current HI:  No  Current AVH: No  Staff Intervention Plan: Group Attendance, Collaborate with Interdisciplinary Treatment Team  Consent to Intern Participation: N/A   Joziyah Roblero-McCall, LRT,CTRS Iosefa Weintraub A Emric Kowalewski-McCall 01/23/2022, 1:39 PM

## 2022-01-23 NOTE — Progress Notes (Signed)
Nursing 1:1 note D:Pt observed laying in bed with eyes closed. RR even and unlabored. No distress noted. A: 1:1 observation continues for safety  R: Pt remains safe  

## 2022-01-23 NOTE — Progress Notes (Signed)
Nursing 1:1 note D:Pt observed sleeping in bed with eyes closed. RR even and unlabored. No distress noted. A: 1:1 observation continues for safety  R: Pt remains safe  

## 2022-01-23 NOTE — Group Note (Signed)
Recreation Therapy Group Note   Group Topic:Team Building  Group Date: 01/23/2022 Start Time: 1020 End Time: 1055 Facilitators: Gabriella Guile-McCall, LRT,CTRS Location: 500 Hall Dayroom   Goal Area(s) Addresses:  Patient will effectively work with peer towards shared goal.  Patient will identify skills used to make activity successful.  Patient will identify how skills used during activity can be used to reach post d/c goals.   Group Description: Straw Bridge. In teams of 3-5, patients were given 15 plastic drinking straws and an equal length of masking tape. Using the materials provided, patients were instructed to build a free standing bridge-like structure to suspend an everyday item (ex: puzzle box) off of the floor or table surface. All materials were required to be used by the team in their design. LRT facilitated post-activity discussion reviewing team process. Patients were encouraged to reflect how the skills used in this activity can be generalized to daily life post discharge.   Affect/Mood: Appropriate   Participation Level: Engaged   Participation Quality: Independent   Behavior: Appropriate   Speech/Thought Process: Focused   Insight: Good   Judgement: Good   Modes of Intervention: STEM Activity   Patient Response to Interventions:  Engaged   Education Outcome:  Acknowledges education and In group clarification offered    Clinical Observations/Individualized Feedback: Pt was appropriate and able to remain on task and focused.  Pt did have moments were he appeared to drift off into thought but was able to be directed by to activity.  Pt worked well with peers by asking their opinions of his ideas and assisting in putting the pieces together.  Pt showed the ability to focus during activity.    Plan: Continue to engage patient in RT group sessions 2-3x/week.   Niasha Devins-McCall, LRT,CTRS  01/23/2022 12:20 PM
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
## 2022-01-24 DIAGNOSIS — F200 Paranoid schizophrenia: Secondary | ICD-10-CM | POA: Diagnosis not present

## 2022-01-24 MED ORDER — OLANZAPINE 10 MG PO TBDP
10.0000 mg | ORAL_TABLET | Freq: Four times a day (QID) | ORAL | Status: DC | PRN
  Filled 2022-01-24: qty 1

## 2022-01-24 MED ORDER — RISPERIDONE 2 MG PO TABS
2.0000 mg | ORAL_TABLET | Freq: Every day | ORAL | Status: DC
  Administered 2022-01-24 – 2022-01-26 (×3): 2 mg via ORAL
  Filled 2022-01-24 (×4): qty 1

## 2022-01-24 MED ORDER — OLANZAPINE 10 MG IM SOLR: 5.0000 mg | Freq: Four times a day (QID) | INTRAMUSCULAR | Status: DC | PRN

## 2022-01-24 MED ORDER — BENZTROPINE MESYLATE 0.5 MG PO TABS: 0.5000 mg | ORAL_TABLET | Freq: Two times a day (BID) | ORAL | Status: DC | PRN

## 2022-01-24 MED ORDER — RISPERIDONE 2 MG PO TABS
2.0000 mg | ORAL_TABLET | Freq: Every day | ORAL | Status: DC
  Administered 2022-01-25 – 2022-01-27 (×3): 2 mg via ORAL
  Filled 2022-01-24 (×4): qty 1

## 2022-01-24 MED ORDER — DIPHENHYDRAMINE HCL 50 MG/ML IJ SOLN
50.0000 mg | Freq: Once | INTRAMUSCULAR | Status: AC
  Administered 2022-01-24: 50 mg via INTRAMUSCULAR
  Filled 2022-01-24: qty 1

## 2022-01-24 MED ORDER — RISPERIDONE 1 MG PO TABS
1.0000 mg | ORAL_TABLET | Freq: Every day | ORAL | Status: DC
  Filled 2022-01-24 (×2): qty 1

## 2022-01-24 MED ORDER — DIPHENHYDRAMINE HCL 50 MG/ML IJ SOLN
INTRAMUSCULAR | Status: AC
  Filled 2022-01-24: qty 1

## 2022-01-24 NOTE — [facility] Suicide Risk Assessment (Addendum)
[facility] [place] INPATIENT:  Family/Significant Other Suicide Prevention Education  Suicide Prevention Education:  Contact Attempts: Ruhan Borak, father/legal guardian [phone] has been identified by the patient as the family member/significant other with whom the patient will be residing, and identified as the person(s) who will aid the patient in the event of a mental health crisis.  With written consent from the patient, two attempts were made to provide suicide prevention education, prior to and/or following the patient's discharge.  We were unsuccessful in providing suicide prevention education.  A suicide education pamphlet was given to the patient to share with family/significant other.  Date and time of first attempt: 01/24/2022 1:56 PM  Date and time of second attempt: 01/25/2022 1:21pm-- addendum: 2nd attempt  Durenda Hurt 01/24/2022, 1:56 PM   Irene Mitcham, LCSW, [place] Social Worker  [facility]

## 2022-01-24 NOTE — Group Note (Signed)
Recreation Therapy Group Note   Group Topic:Coping Skills  Group Date: 01/24/2022 Start Time: 1005 End Time: 1035 Facilitators: Didi Ganaway-McCall, LRT,CTRS Location: 500 Hall Dayroom   Goal Area(s) Addresses: Patient will define what a coping skill is. Patient will create a list of healthy coping skills beginning with each letter of the alphabet. Patient will successfully identify positive coping skills they can use post d/c.    Group Description: Coping A to Z. Patient asked to identify what a coping skill is and when they use them. Patients with Probation officer discussed healthy versus unhealthy coping skills. Next patients were given a blank worksheet titled "Coping Skills A-Z". Partners were instructed to come up with at least one positive coping skill per letter of the alphabet. Patients were given 15 minutes to brainstorm, before ideas were presented to the large group. Patients and LRT debriefed on the importance of coping skill selection based on situation and back-up plans when a skill tried is not effective. At the end of group, patients were given an handout of alphabetized strategies to keep for future reference.   Affect/Mood: N/A   Participation Level: Did not attend    Clinical Observations/Individualized Feedback:     Plan: Continue to engage patient in RT group sessions 2-3x/week.   Brendan Hogan, LRT,CTRS 01/24/2022 12:46 PM

## 2022-01-24 NOTE — Progress Notes (Signed)
Nursing 1:1 note D:Pt observed sleeping in bed with eyes closed. RR even and unlabored. No distress noted. A: 1:1 observation continues for safety  R: Pt remains safe  
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
## 2022-01-24 NOTE — Progress Notes (Signed)
   01/24/22 1950  Psych Admission Type (Psych Patients Only)  Admission Status Involuntary  Psychosocial Assessment  Patient Complaints Suspiciousness  Eye Contact Fair  Facial Expression Anxious  Affect Anxious  Speech Pressured;Rapid  Interaction Guarded;Demanding  Motor Activity Fidgety  Appearance/Hygiene In scrubs  Behavior Characteristics Cooperative;Guarded  Mood Suspicious  Thought Process  Coherency Circumstantial  Content Paranoia  Delusions Paranoid  Perception WDL  Hallucination None reported or observed  Judgment Impaired  Confusion Mild  Danger to Self  Current suicidal ideation? Denies  Danger to Others  Danger to Others None reported or observed   Progress note   D: Pt seen in his room with his sitter. Pt denies SI, HI, AVH. Pt rates pain  0/10. Pt rates anxiety  0/10 and depression  0/10. Pt is short with his answers. Pt covers his head with his blanket. Pt wants his medication right now but is reminded that medications will be given out at 2030. Pt able to be redirected. Discussed, with pt, medications for tonight. Pt requesting his sleep medication. No other complaints noted at this time.   A: Pt provided support and encouragement. Pt given scheduled medication as prescribed. PRNs as appropriate. Q15 min checks for safety.   R: Pt safe on the unit. Will continue to monitor.

## 2022-01-24 NOTE — Progress Notes (Signed)
   01/24/22 0548  Sleep  Number of Hours 7.25

## 2022-01-24 NOTE — Progress Notes (Signed)
Patient continues to refuse to allow writer to obtain EKG.
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
## 2022-01-24 NOTE — Progress Notes (Signed)
1:1 Note: Patient maintained on constant supervision for safety.  Patient presents with anxious mood and affect.  Medications given as prescribed.  Food and fluid intake adequate.  Routine safety checks maintained.  Patient in his room interacting with staff.  Support and encouragement offered as needed.  Patient is safe on the unit with supervision

## 2022-01-24 NOTE — Progress Notes (Signed)
1:1 Note: Patient maintained on constant supervision for safety.  Patient visible in the hallway.  Refused to sit in the dayroom due to paranoia.  Presents with anxious mood and affect.  Routine safety checks maintained.  Patient is safe on the unit with supervision.

## 2022-01-24 NOTE — Progress Notes (Signed)
Patient refused vital signs x2.
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
## 2022-01-25 ENCOUNTER — Encounter (HOSPITAL_COMMUNITY): Payer: Self-pay

## 2022-01-25 DIAGNOSIS — F200 Paranoid schizophrenia: Secondary | ICD-10-CM | POA: Diagnosis not present

## 2022-01-25 MED ORDER — BENZTROPINE MESYLATE 0.5 MG PO TABS
0.5000 mg | ORAL_TABLET | Freq: Two times a day (BID) | ORAL | Status: DC
  Administered 2022-01-25 – 2022-01-30 (×11): 0.5 mg via ORAL
  Filled 2022-01-25 (×16): qty 1

## 2022-01-25 MED ORDER — LORAZEPAM 2 MG/ML IJ SOLN: 2.0000 mg | INTRAMUSCULAR | Status: DC | PRN

## 2022-01-25 MED ORDER — DOCUSATE SODIUM 100 MG PO CAPS: 100.0000 mg | ORAL_CAPSULE | Freq: Every day | ORAL | Status: DC | PRN

## 2022-01-25 MED ORDER — LORAZEPAM 2 MG/ML IJ SOLN: 2.0000 mg | Freq: Four times a day (QID) | INTRAMUSCULAR | Status: DC | PRN

## 2022-01-25 MED ORDER — LORAZEPAM 1 MG PO TABS: 1.0000 mg | ORAL_TABLET | Freq: Four times a day (QID) | ORAL | Status: DC | PRN

## 2022-01-25 MED ORDER — LORAZEPAM 1 MG PO TABS: 1.0000 mg | ORAL_TABLET | ORAL | Status: DC | PRN

## 2022-01-25 MED ORDER — CLONIDINE HCL 0.1 MG PO TABS
0.1000 mg | ORAL_TABLET | Freq: Two times a day (BID) | ORAL | Status: DC
  Administered 2022-01-25 – 2022-02-08 (×23): 0.1 mg via ORAL
  Filled 2022-01-25 (×11): qty 1
  Filled 2022-01-25: qty 20
  Filled 2022-01-25 (×3): qty 1
  Filled 2022-01-25: qty 20
  Filled 2022-01-25 (×4): qty 1
  Filled 2022-01-25: qty 20
  Filled 2022-01-25 (×5): qty 1
  Filled 2022-01-25: qty 20
  Filled 2022-01-25 (×10): qty 1

## 2022-01-25 MED ORDER — DIPHENHYDRAMINE HCL 50 MG/ML IJ SOLN: 50.0000 mg | Freq: Four times a day (QID) | INTRAMUSCULAR | Status: DC | PRN

## 2022-01-25 MED ORDER — DIPHENHYDRAMINE HCL 50 MG/ML IJ SOLN
50.0000 mg | Freq: Four times a day (QID) | INTRAMUSCULAR | Status: DC | PRN
  Filled 2022-01-25: qty 1

## 2022-01-25 MED ORDER — LORAZEPAM 1 MG PO TABS
1.0000 mg | ORAL_TABLET | Freq: Two times a day (BID) | ORAL | Status: DC
  Administered 2022-01-25 – 2022-02-06 (×26): 1 mg via ORAL
  Filled 2022-01-25 (×27): qty 1

## 2022-01-25 NOTE — Progress Notes (Signed)
   01/25/22 0551  Sleep  Number of Hours 7.75
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
## 2022-01-25 NOTE — Group Note (Signed)
Recreation Therapy Group Note   Group Topic:Healthy Decision Making  Group Date: 01/25/2022 Start Time: 1610 End Time: 1040 Facilitators: Shields Pautz-McCall, LRT,CTRS Location: 500 Hall Dayroom   Goal Area(s) Addresses:  Patient will effectively work with peer towards shared goal.  Patient will identify factors that guided their decision making.  Patient will pro-socially communicate ideas during group session.   Group Description:  Patients were given a scenario that they were going to be stranded on a deserted [place] for several months before being rescued. Writer tasked them with making a list of 15 things they would choose to bring with them for "survival". The list of items was prioritized most important to least. Each patient would come up with their own list, then work together to create a new list of 15 items while in a group of 3-5 peers. LRT discussed each person's list and how it differed from others. The debrief included discussion of priorities, good decisions versus bad decisions, and how it is important to think before acting so we can make the best decision possible. LRT tied the concept of effective communication among group members to patient's support systems outside of the hospital and its benefit post discharge.   Affect/Mood: N/A   Participation Level: Did not attend    Clinical Observations/Individualized Feedback:     Plan: Continue to engage patient in RT group sessions 2-3x/week.   Saber Dickerman-McCall, LRT,CTRS 01/25/2022 1:34 PM

## 2022-01-25 NOTE — Plan of Care (Signed)
Attempted to reach guardian Mr. Daron Offer regarding medications adjustments (starting scheduled ativan and cogentin). Patient's guardian last phone call told me he was amenable to any medication adjustments we deemed necessary to control patient's aggression and lability.  France Ravens, MD PGY2 Psychiatry Resident

## 2022-01-25 NOTE — BH IP Treatment Plan (Signed)
Interdisciplinary Treatment and Diagnostic Plan Update  01/25/2022 Time of Session: 0830 Brendan Hogan MRN: [id number]  Principal Diagnosis: Schizophrenia [facility])  Secondary Diagnoses: Principal Problem:   Schizophrenia (HCC)   Current Medications:  Current Facility-Administered Medications  Medication Dose Route Frequency Provider Last Rate Last Admin   acetaminophen (TYLENOL) tablet 650 mg  650 mg Oral Q6H PRN Jackelyn Poling, NP       alum & mag hydroxide-simeth (MAALOX/MYLANTA) 200-200-20 MG/5ML suspension 30 mL  30 mL Oral Q4H PRN Nira Conn A, NP       benztropine (COGENTIN) tablet 0.5 mg  0.5 mg Oral BID Mason Jim, Amy E, MD   0.5 mg at 01/25/22 0951   cloNIDine (CATAPRES) tablet 0.1 mg  0.1 mg Oral BID Park Pope, MD   0.1 mg at 01/25/22 0950   diphenhydrAMINE (BENADRYL) injection 50 mg  50 mg Intramuscular Q6H PRN Mason Jim, Amy E, MD       divalproex (DEPAKOTE) DR tablet 500 mg  500 mg Oral Q12H Park Pope, MD   500 mg at 01/25/22 0950   hydrOXYzine (ATARAX) tablet 25 mg  25 mg Oral TID PRN Nwoko, Uchenna E, PA   25 mg at 01/24/22 1643   LORazepam (ATIVAN) tablet 1 mg  1 mg Oral Q6H PRN Comer Locket, MD       Or   LORazepam (ATIVAN) injection 2 mg  2 mg Intramuscular Q6H PRN Mason Jim, Amy E, MD       LORazepam (ATIVAN) tablet 1 mg  1 mg Oral BID Mason Jim, Amy E, MD   1 mg at 01/25/22 0951   magnesium hydroxide (MILK OF MAGNESIA) suspension 30 mL  30 mL Oral Daily PRN Jackelyn Poling, NP       mirtazapine (REMERON) tablet 15 mg  15 mg Oral QHS Nira Conn A, NP   15 mg at 01/24/22 2039   nicotine polacrilex (NICORETTE) gum 2 mg  2 mg Oral PRN Nira Conn A, NP   2 mg at 01/23/22 1239   OLANZapine (ZYPREXA) injection 5 mg  5 mg Intramuscular Q6H PRN Comer Locket, MD       Or   OLANZapine zydis (ZYPREXA) disintegrating tablet 10 mg  10 mg Oral Q6H PRN Mason Jim, Amy E, MD       risperiDONE (RISPERDAL) tablet 2 mg  2 mg Oral QHS Mason Jim, Amy E, MD   2 mg at  01/24/22 2039   risperiDONE (RISPERDAL) tablet 2 mg  2 mg Oral Daily Park Pope, MD   2 mg at 01/25/22 0951   zolpidem (AMBIEN) tablet 5 mg  5 mg Oral QHS PRN Comer Locket, MD   5 mg at 01/24/22 2039   PTA Medications: Medications Prior to Admission  Medication Sig Dispense Refill Last Dose   ARIPiprazole (ABILIFY) 20 MG tablet Take 1 tablet (20 mg total) by mouth daily. (Patient not taking: Reported on 01/08/2022) 30 tablet 1    mirtazapine (REMERON) 15 MG tablet Take 1 tablet (15 mg total) by mouth at bedtime. (Patient not taking: Reported on 01/08/2022) 30 tablet 1    traZODone (DESYREL) 50 MG tablet Take 50 mg by mouth at bedtime. (Patient not taking: Reported on 01/08/2022)      zolpidem (AMBIEN) 10 MG tablet Take 1 tablet (10 mg total) by mouth at bedtime. (Patient not taking: Reported on 01/08/2022) 2 tablet 0     Patient Stressors: Other: Patient identifies no stressors. He states he has been off of  his medications and "it was all blown out of proportion."    Patient Strengths: Ability for insight  Supportive family/friends   Treatment Modalities: Medication Management, Group therapy, Case management,  1 to 1 session with clinician, Psychoeducation, Recreational therapy.   Physician Treatment Plan for Primary Diagnosis: Schizophrenia (HCC) Long Term Goal(s): Improvement in symptoms so as ready for discharge   Short Term Goals: Ability to identify changes in lifestyle to reduce recurrence of condition will improve Ability to verbalize feelings will improve Ability to disclose and discuss suicidal ideas Ability to demonstrate self-control will improve Ability to identify and develop effective coping behaviors will improve Ability to maintain clinical measurements within normal limits will improve  Medication Management: Evaluate patient's response, side effects, and tolerance of medication regimen.  Therapeutic Interventions: 1 to 1 sessions, Unit Group sessions and Medication  administration.  Evaluation of Outcomes: Progressing  Physician Treatment Plan for Secondary Diagnosis: Principal Problem:   Schizophrenia (HCC)  Long Term Goal(s): Improvement in symptoms so as ready for discharge   Short Term Goals: Ability to identify changes in lifestyle to reduce recurrence of condition will improve Ability to verbalize feelings will improve Ability to disclose and discuss suicidal ideas Ability to demonstrate self-control will improve Ability to identify and develop effective coping behaviors will improve Ability to maintain clinical measurements within normal limits will improve     Medication Management: Evaluate patient's response, side effects, and tolerance of medication regimen.  Therapeutic Interventions: 1 to 1 sessions, Unit Group sessions and Medication administration.  Evaluation of Outcomes: Progressing   RN Treatment Plan for Primary Diagnosis: Schizophrenia (HCC) Long Term Goal(s): Knowledge of disease and therapeutic regimen to maintain health will improve  Short Term Goals: Ability to remain free from injury will improve, Ability to verbalize frustration and anger appropriately will improve, Ability to demonstrate self-control, Ability to participate in decision making will improve, Ability to verbalize feelings will improve, Ability to disclose and discuss suicidal ideas, Ability to identify and develop effective coping behaviors will improve, and Compliance with prescribed medications will improve  Medication Management: RN will administer medications as ordered by provider, will assess and evaluate patient's response and provide education to patient for prescribed medication. RN will report any adverse and/or side effects to prescribing provider.  Therapeutic Interventions: 1 on 1 counseling sessions, Psychoeducation, Medication administration, Evaluate responses to treatment, Monitor vital signs and CBGs as ordered, Perform/monitor CIWA, COWS,  AIMS and Fall Risk screenings as ordered, Perform wound care treatments as ordered.  Evaluation of Outcomes: Progressing   LCSW Treatment Plan for Primary Diagnosis: Schizophrenia (HCC) Long Term Goal(s): Safe transition to appropriate next level of care at discharge, Engage patient in therapeutic group addressing interpersonal concerns.  Short Term Goals: Engage patient in aftercare planning with referrals and resources, Increase social support, Increase ability to appropriately verbalize feelings, Increase emotional regulation, Facilitate acceptance of mental health diagnosis and concerns, Facilitate patient progression through stages of change regarding substance use diagnoses and concerns, Identify triggers associated with mental health/substance abuse issues, and Increase skills for wellness and recovery  Therapeutic Interventions: Assess for all discharge needs, 1 to 1 time with Social worker, Explore available resources and support systems, Assess for adequacy in community support network, Educate family and significant other(s) on suicide prevention, Complete Psychosocial Assessment, Interpersonal group therapy.  Evaluation of Outcomes: Progressing   Progress in Treatment: Attending groups: Yes. Participating in groups: Yes. Taking medication as prescribed: Yes. Toleration medication: Yes. Family/Significant other contact made: No, will contact:  CSW unable to reach father, will make additional attempts.  Patient understands diagnosis: No. Discussing patient identified problems/goals with staff: Yes. Medical problems stabilized or resolved: Yes. Denies suicidal/homicidal ideation: No. Issues/concerns per patient self-inventory: Yes. Other: none  New problem(s) identified: No, Describe:  none  New Short Term/Long Term Goal(s): Patient to work towards detox, elimination of symptoms of psychosis, medication management for mood stabilization; elimination of SI thoughts; development  of comprehensive mental wellness/sobriety plan.  Patient Goals: No additional goals identified at this time. Patient to continue to work towards original goals identified in initial treatment team meeting. CSW will remain available to patient should they voice additional treatment goals.   Discharge Plan or Barriers: No psychosocial barriers identified at this time, patient to return to place of residence when appropriate for discharge.   Reason for Continuation of Hospitalization: Other; describe psychosis   Estimated Length of Stay: 1-7 days   Last 3 [place] Suicide Severity Risk Score: [place] Admission (Current) from 01/20/2022 in [place] [facility] ED from 01/08/2022 in [place] DEPT ED from 12/26/2021 in [place] No Risk Moderate Risk High Risk       Last PHQ 2/9 Scores:    07/19/2020    3:12 PM 05/29/2020    7:35 AM  Depression screen PHQ 2/9  Decreased Interest 1 0  Down, Depressed, Hopeless 1 1  PHQ - 2 Score 2 1  Altered sleeping 2   Tired, decreased energy 1   Change in appetite 1   Feeling bad or failure about yourself  1   Trouble concentrating 1   Moving slowly or fidgety/restless 0   Suicidal thoughts 0   PHQ-9 Score 8     Scribe for Treatment Team: Larose Kells 01/25/2022 10:38 AM

## 2022-01-25 NOTE — Progress Notes (Signed)
Nursing 1:1 Note: Patient remains in room and continues to interact with staff and peers. 1:1 observation remains for safety. Patient safe at this time.

## 2022-01-25 NOTE — Progress Notes (Signed)
Adult Psychoeducational Group Note  Date:  01/25/2022 Time:  8:35 PM  Group Topic/Focus:  Wrap-Up Group:   The focus of this group is to help patients review their daily goal of treatment and discuss progress on daily workbooks.  Participation Level:  Did Not Attend  Participation Quality:  Did Not Attend  Affect:  Did Not Attend  Cognitive:  Did Not Attend  Insight: Did Not Attend  Engagement in Group:  Did Not Attend  Modes of Intervention:  Did Not Attend  Additional Comments:  Pt was encouraged to attend group discussion but refused   Gerhard Perches 01/25/2022, 8:35 PM

## 2022-01-25 NOTE — Plan of Care (Signed)
  Problem: Coping: Goal: Coping ability will improve Outcome: Progressing Goal: Will verbalize feelings Outcome: Progressing   

## 2022-01-25 NOTE — Progress Notes (Signed)
Nursing 1:1 note Patient remains in his room under 1:1 supervision. He continues to interact with staff and agreed to EKG and evening meds. Patient was educated on medications and stated understanding. Patient is safe at this time.

## 2022-01-25 NOTE — [facility] Group Notes (Signed)
Chaplain unable to provide spirituality group today due to being short-staffed in the Spiritual Care and Counseling Dept.   

## 2022-01-25 NOTE — Progress Notes (Signed)
Nursing 1:1 note D:Pt observed sleeping in bed with eyes closed. RR even and unlabored. No distress noted. A: 1:1 observation continues for safety  R: Pt remains safe  

## 2022-01-25 NOTE — Group Note (Signed)
[facility] LCSW Group Therapy Note   Group Date: 01/25/2022 Start Time: 1100 End Time: 1200   Type of Therapy/Topic:  Group Therapy:  Emotion Regulation  Participation Level:  Did Not Attend     Description of Group:    The purpose of this group is to assist patients in learning to regulate negative emotions and experience positive emotions. Patients will be guided to discuss ways in which they have been vulnerable to their negative emotions. These vulnerabilities will be juxtaposed with experiences of positive emotions or situations, and patients challenged to use positive emotions to combat negative ones. Special emphasis will be placed on coping with negative emotions in conflict situations, and patients will process healthy conflict resolution skills.  Therapeutic Goals: Patient will identify two positive emotions or experiences to reflect on in order to balance out negative emotions:  Patient will label two or more emotions that they find the most difficult to experience:  Patient will be able to demonstrate positive conflict resolution skills through discussion or role plays:   Summary of Patient Progress:   Did not attend     Therapeutic Modalities:   Cognitive Behavioral Therapy Feelings Identification Dialectical [place], LCSW

## 2022-01-25 NOTE — Progress Notes (Signed)
EKG complete.

## 2022-01-25 NOTE — Progress Notes (Signed)
Nursing 1:1 Note Patient in room interacting with staff and peers that are walking by in the hallway. 1:1 observation continues for safety. Vital signs obtained in room.  Patient is safe for now.

## 2022-01-25 NOTE — Progress Notes (Signed)
   01/25/22 1100  Charting Type  Charting Type Shift assessment  Neurological  Neuro (WDL) WDL  Neuro Symptoms Anxiety  HEENT  HEENT (WDL) WDL  Respiratory  Respiratory (WDL) WDL  Cardiac  Cardiac (WDL) WDL  Vascular  Vascular (WDL) WDL  Integumentary  Integumentary (WDL) WDL  Braden Scale (Ages 8 and up)  Sensory Perceptions 4  Moisture 4  Activity 4  Mobility 4  Nutrition 3  Friction and Shear 3  Braden Scale Score 22  Musculoskeletal  Musculoskeletal (WDL) WDL  Gastrointestinal  Gastrointestinal (WDL) WDL  GU Assessment  Genitourinary (WDL) WDL  Neurological  Level of Consciousness Alert

## 2022-01-26 DIAGNOSIS — F200 Paranoid schizophrenia: Secondary | ICD-10-CM | POA: Diagnosis not present

## 2022-01-26 MED ORDER — NICOTINE POLACRILEX 2 MG MT GUM
CHEWING_GUM | OROMUCOSAL | Status: AC
  Filled 2022-01-26: qty 1

## 2022-01-26 MED ORDER — ZOLPIDEM TARTRATE 5 MG PO TABS
7.5000 mg | ORAL_TABLET | Freq: Every evening | ORAL | Status: DC | PRN
  Administered 2022-01-26 – 2022-01-27 (×2): 7.5 mg via ORAL
  Filled 2022-01-26 (×2): qty 2

## 2022-01-26 NOTE — Progress Notes (Signed)
Patient being monitored 1:1   Patient is noted to be resting in bed with 1:1 staff in close proximity. Patient was able to wake to take medications, then went back to sleep. Patient is in no distress. 1:1 maintained patient remains safe.

## 2022-01-26 NOTE — Progress Notes (Signed)
1:1 Nursing note:  Pt is laying in bed . Respirations are even and unlabored. No signs of distress. 1:1 continued for pt safety. Safety maintained.  
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
## 2022-01-26 NOTE — Group Note (Signed)
  [facility] LCSW Group Therapy Note  Date/Time:  01/26/2022   Type of Therapy and Topic:  Group Therapy:  Feelings About Hospitalization  Participation Level:  Did Not Attend   Description of Group This process group involved patients discussing their feelings related to being hospitalized, as well as the benefits they see to being in the hospital.  These feelings and benefits were itemized.  The group then brainstormed specific ways in which they could seek those same benefits when they discharge and return home.  Therapeutic Goals Patient will identify and describe positive and negative feelings related to hospitalization Patient will verbalize benefits of hospitalization to themselves personally Patients will brainstorm together ways they can obtain similar benefits in the outpatient setting, identify barriers to wellness and possible solutions  Summary of Patient Progress:  The patient did not attend.  Therapeutic Modalities Cognitive Behavioral Therapy Motivational [place], [place] 01/26/2022, 11:43 AM

## 2022-01-26 NOTE — Progress Notes (Signed)
Patient being monitored 1:1   Patient is noted to be resting in bed with 1:1 staff in close proximity. Patient was able to wake to take medications, then went back to sleep. Patient is in no distress. 1:1 maintained patient remains safe.  

## 2022-01-26 NOTE — Progress Notes (Signed)
1:1 Nursing note:  Pt is laying in bed . Respirations are even and unlabored. No signs of distress. 1:1 continued for pt safety. Safety maintained.

## 2022-01-26 NOTE — Progress Notes (Signed)

## 2022-01-27 DIAGNOSIS — F200 Paranoid schizophrenia: Secondary | ICD-10-CM | POA: Diagnosis not present

## 2022-01-27 LAB — CBC WITH DIFFERENTIAL/PLATELET
Abs Immature Granulocytes: 0.01 10*3/uL (ref 0.00–0.07)
Basophils Absolute: 0 10*3/uL (ref 0.0–0.1)
Basophils Relative: 1 %
Eosinophils Absolute: 0.1 10*3/uL (ref 0.0–0.5)
Eosinophils Relative: 3 %
HCT: 42.2 % (ref 39.0–52.0)
Hemoglobin: 13.7 g/dL (ref 13.0–17.0)
Immature Granulocytes: 0 %
Lymphocytes Relative: 47 %
Lymphs Abs: 2 10*3/uL (ref 0.7–4.0)
MCH: 29 pg (ref 26.0–34.0)
MCHC: 32.5 g/dL (ref 30.0–36.0)
MCV: 89.2 fL (ref 80.0–100.0)
Monocytes Absolute: 0.5 10*3/uL (ref 0.1–1.0)
Monocytes Relative: 11 %
Neutro Abs: 1.6 10*3/uL — ABNORMAL LOW (ref 1.7–7.7)
Neutrophils Relative %: 38 %
Platelets: 187 10*3/uL (ref 150–400)
RBC: 4.73 MIL/uL (ref 4.22–5.81)
RDW: 13 % (ref 11.5–15.5)
WBC: 4.2 10*3/uL (ref 4.0–10.5)
nRBC: 0 % (ref 0.0–0.2)

## 2022-01-27 LAB — HEPATIC FUNCTION PANEL
ALT: 14 U/L (ref 0–44)
AST: 17 U/L (ref 15–41)
Albumin: 3.6 g/dL (ref 3.5–5.0)
Alkaline Phosphatase: 76 U/L (ref 38–126)
Bilirubin, Direct: <0.1 mg/dL (ref 0.0–0.2)
Total Bilirubin: 0.5 mg/dL (ref 0.3–1.2)
Total Protein: 6.1 g/dL — ABNORMAL LOW (ref 6.5–8.1)

## 2022-01-27 LAB — VALPROIC ACID LEVEL: Valproic Acid Lvl: 16 ug/mL — ABNORMAL LOW (ref 50.0–100.0)

## 2022-01-27 MED ORDER — DIVALPROEX SODIUM 500 MG PO DR TAB
750.0000 mg | DELAYED_RELEASE_TABLET | Freq: Two times a day (BID) | ORAL | Status: DC
  Administered 2022-01-27 – 2022-02-08 (×24): 750 mg via ORAL
  Filled 2022-01-27 (×31): qty 1

## 2022-01-27 NOTE — Progress Notes (Signed)
1:1 Note Pt took all his night time medications as scheduled including Ambien 7.5 mg PO for sleep. But pt complained of not being able to sleep and for a short that makes him laugh. Pt given vitaril 25 mg for anxiety. Pt is in bed at this time awake. Pt remains safe on the unit, staff with the pt, will continue to monitor.

## 2022-01-27 NOTE — Group Note (Signed)
[facility] LCSW Group Therapy Note   Group Date: 01/27/2022 Start Time: 1300 End Time: 1400   Type of Therapy and Topic: Group Therapy: Avoiding Self-Sabotaging and Enabling Behaviors  Participation Level: Did Not Attend  Mood:  Description of Group:  In this group, patients will learn how to identify obstacles, self-sabotaging and enabling behaviors, as well as: what are they, why do we do them and what needs these behaviors meet. Discuss unhealthy relationships and how to have positive healthy boundaries with those that sabotage and enable. Explore aspects of self-sabotage and enabling in yourself and how to limit these self-destructive behaviors in everyday life.   Therapeutic Goals: 1. Patient will identify one obstacle that relates to self-sabotage and enabling behaviors 2. Patient will identify one personal self-sabotaging or enabling behavior they did prior to admission 3. Patient will state a plan to change the above identified behavior 4. Patient will demonstrate ability to communicate their needs through discussion and/or role play.    Summary of Patient Progress:  Patient did not attend group despite encouraged participation.    Therapeutic Modalities:  Cognitive Behavioral Therapy Person-Centered Therapy Motivational Interviewing    Mars Scheaffer W Ioan Landini, LCSWA 

## 2022-01-27 NOTE — Progress Notes (Signed)
1:1 Nursing note:  Pt is laying in bed . Respirations are even and unlabored. No signs of distress. 1:1 continued for pt safety. Safety maintained.  
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
## 2022-01-27 NOTE — Progress Notes (Signed)
D: Patient alert and oriented. In bed eyes closed resting. No distress noted.  A: Continues to be on 1:1 for safety.  R: Patient remains self.

## 2022-01-27 NOTE — Progress Notes (Signed)
D: Patient in room. Getting ready for lunch. No distress noted.  A: Continues to be on 1:1 for safety.  R: Patient remains safe.

## 2022-01-27 NOTE — Progress Notes (Signed)
1:1 note - patient in the ...Marland KitchenMarland KitchenMarland Kitchen with sitter at side, watching tv and reading. No complaints at this time. No SI or HI at present. Denies A/V hallucinations but still responding to internal stimuli.

## 2022-01-27 NOTE — Progress Notes (Signed)
1:1 note Pt observed at the beginning of the shift laying on bed reading a book. Pt stated he had a good day. Pt did not attend wrap group, denied SI/HI and contracted for safety. No unwanted behavior noted, staff remain with pt for safety purposes, will continue to monitor.

## 2022-01-27 NOTE — Progress Notes (Signed)
D: Patient in room reading a book. Calm, alert and oriented. No distress noted.  A: Continued 1:1 for safety.  R: Patient remains safe.

## 2022-01-27 NOTE — Progress Notes (Signed)
Adult Psychoeducational Group Note  Date:  01/27/2022 Time:  9:10 PM  Group Topic/Focus:  Wrap-Up Group:   The focus of this group is to help patients review their daily goal of treatment and discuss progress on daily workbooks.  Participation Level:  Did Not Attend  Participation Quality:   Did not attend  Affect:   Did not attend  Cognitive:   Did not attend  Insight: None  Engagement in Group:   Did not attend  Modes of Intervention:   Did not attend  Additional Comments:  Pt notified at start of group but did not attend   JEHU-APPIAH, Yavuz Kirby K 01/27/2022, 9:10 PM

## 2022-01-27 NOTE — Progress Notes (Signed)
   01/27/22 1100  Psych Admission Type (Psych Patients Only)  Admission Status Involuntary  Psychosocial Assessment  Patient Complaints Anxiety;Suspiciousness  Eye Contact Fair  Facial Expression Anxious  Affect Anxious  Speech Pressured;Aggressive  Interaction Guarded;Manipulative  Motor Activity Fidgety  Appearance/Hygiene In scrubs  Behavior Characteristics Cooperative;Anxious  Mood Suspicious  Thought Process  Coherency Circumstantial  Content Paranoia  Delusions Paranoid  Perception WDL  Hallucination None reported or observed  Judgment Impaired  Confusion Mild  Danger to Self  Current suicidal ideation? Denies  Danger to Others  Danger to Others None reported or observed

## 2022-01-27 NOTE — Progress Notes (Signed)
   01/27/22 1100  Psych Admission Type (Psych Patients Only)  Admission Status Involuntary  Psychosocial Assessment  Patient Complaints Anxiety;Suspiciousness  Eye Contact Fair  Facial Expression Anxious  Affect Anxious  Speech Pressured;Aggressive  Interaction Guarded;Manipulative  Motor Activity Fidgety  Appearance/Hygiene In scrubs  Behavior Characteristics Cooperative;Anxious  Mood Suspicious  Thought Process  Coherency Circumstantial  Content Paranoia  Delusions Paranoid  Perception WDL  Hallucination None reported or observed  Judgment Impaired  Confusion Mild  Danger to Self  Current suicidal ideation? Denies  Danger to Others  Danger to Others None reported or observed    

## 2022-01-27 NOTE — Progress Notes (Signed)

## 2022-01-28 DIAGNOSIS — F209 Schizophrenia, unspecified: Secondary | ICD-10-CM

## 2022-01-28 LAB — CBC WITH DIFFERENTIAL/PLATELET
Abs Immature Granulocytes: 0.04 10*3/uL (ref 0.00–0.07)
Basophils Absolute: 0 10*3/uL (ref 0.0–0.1)
Basophils Relative: 1 %
Eosinophils Absolute: 0.1 10*3/uL (ref 0.0–0.5)
Eosinophils Relative: 2 %
HCT: 46.6 % (ref 39.0–52.0)
Hemoglobin: 14.9 g/dL (ref 13.0–17.0)
Immature Granulocytes: 1 %
Lymphocytes Relative: 36 %
Lymphs Abs: 2 10*3/uL (ref 0.7–4.0)
MCH: 28.7 pg (ref 26.0–34.0)
MCHC: 32 g/dL (ref 30.0–36.0)
MCV: 89.6 fL (ref 80.0–100.0)
Monocytes Absolute: 0.5 10*3/uL (ref 0.1–1.0)
Monocytes Relative: 8 %
Neutro Abs: 3 10*3/uL (ref 1.7–7.7)
Neutrophils Relative %: 52 %
Platelets: 223 10*3/uL (ref 150–400)
RBC: 5.2 MIL/uL (ref 4.22–5.81)
RDW: 12.8 % (ref 11.5–15.5)
WBC: 5.7 10*3/uL (ref 4.0–10.5)
nRBC: 0 % (ref 0.0–0.2)

## 2022-01-28 LAB — VALPROIC ACID LEVEL: Valproic Acid Lvl: 66 ug/mL (ref 50.0–100.0)

## 2022-01-28 MED ORDER — ZOLPIDEM TARTRATE 5 MG PO TABS
10.0000 mg | ORAL_TABLET | Freq: Every evening | ORAL | Status: DC | PRN
  Administered 2022-01-28 – 2022-02-07 (×11): 10 mg via ORAL
  Filled 2022-01-28 (×7): qty 2
  Filled 2022-01-28: qty 1
  Filled 2022-01-28 (×4): qty 2

## 2022-01-28 NOTE — Group Note (Signed)
Recreation Therapy Group Note   Group Topic:Health and Wellness  Group Date: 01/28/2022 Start Time: 1005 End Time: 1038 Facilitators: Katiria Calame-McCall, LRT,CTRS Location: [address] Dayroom   Goal Area(s) Addresses:  Patient will verbalize benefit of exercise during group session. Patient will verbalize an exercise that can be completed in their hospital room during admission. Patient will acknowledge benefits of exercise when used as a coping mechanism.   Group Description: Exercise.  LRT and patients discussed the importance of exercise in day to day living.  LRT led patients in a series of stretches to get loose before the actual exercises.  Patients then took turns leading the group in the exercises of their choosing.  LRT and patients were going for at least 30 minutes of exercise.  Patients were encouraged to take breaks and get water when needed.     Affect/Mood: N/A   Participation Level: Did not attend    Clinical Observations/Individualized Feedback:     Plan: Continue to engage patient in RT group sessions 2-3x/week.   Riddhi Grether-McCall, LRT,CTRS 01/28/2022 12:29 PM

## 2022-01-28 NOTE — [facility] Group Notes (Signed)
Patient did not attend the Wrap-up.

## 2022-01-28 NOTE — Progress Notes (Signed)
Nursing 1:1  Patient's BP at 1654 was recorded as 98/58. This RN provided patient with Gatorade and encouraged the patient to move around. The patient accepted and drank the Gatorade but declined getting out of bed. The patient was asked on 3 separate occassions, each time declining. The patient denied any symptoms of hypotension, including lightheadedness and dizziness. He stated that he felt "fine." The patient's BP was rechecked at 1817 and it was 105/76. The patient apologized and said that he "didn't mean to be disrespectful." The patient is now resting comfortably in bed. 1:1 monitoring remains for patient safety.

## 2022-01-28 NOTE — Progress Notes (Signed)
1:1 Nursing Note:  Patient remains in his room sleeping. Patient is safe at this time. 1:1 monitoring to continue

## 2022-01-28 NOTE — Progress Notes (Signed)
Nursing 1:1 Note   Patient was administered ordered medication this morning without incident or adverse effects. Patient remains in room and did not attend group. Patient remains on 1:1 for safety and staff will continue to monitor. Patient remains safe at this time.

## 2022-01-28 NOTE — Progress Notes (Signed)
1:1 Note Pt did not sleep much last night, pt kept asking for more medication, to help him sleep. Vistaril 25 mg PO was given, pt calmed down a bit after that. Pt remains on 1:1 for safety, will continue to monitor.
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
## 2022-01-28 NOTE — Group Note (Signed)
LCSW Group Therapy Note  Group Date: 01/28/2022 Start Time: 1300 End Time: 1400   Type of Therapy and Topic:  Group Therapy - How To Cope with Nervousness about Discharge   Participation Level:  Did Not Attend   Description of Group This process group involved identification of patients' feelings about discharge. Some of them are scheduled to be discharged soon, while others are new admissions, but each of them was asked to share thoughts and feelings surrounding discharge from the hospital. One common theme was that they are excited at the prospect of going home, while another was that many of them are apprehensive about sharing why they were hospitalized. Patients were given the opportunity to discuss these feelings with their peers in preparation for discharge.  Therapeutic Goals  Patient will identify their overall feelings about pending discharge. Patient will think about how they might proactively address issues that they believe will once again arise once they get home (i.e. with parents). Patients will participate in discussion about having hope for change.   Summary of Patient Progress:  Did not attend  Therapeutic Modalities [place], LCSW 01/28/2022  2:04 PM
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
## 2022-01-29 DIAGNOSIS — F200 Paranoid schizophrenia: Secondary | ICD-10-CM | POA: Diagnosis not present

## 2022-01-29 MED ORDER — FLUPHENAZINE HCL 5 MG PO TABS
5.0000 mg | ORAL_TABLET | Freq: Two times a day (BID) | ORAL | Status: DC
  Administered 2022-01-29 – 2022-01-30 (×3): 5 mg via ORAL
  Filled 2022-01-29 (×8): qty 1

## 2022-01-29 NOTE — [facility] Group Notes (Signed)
Patient did not attend the Wrap-up group. 

## 2022-01-29 NOTE — Progress Notes (Signed)
Nursing 1:1  Patient remains in his room with one to one observation. Patient is irritable post lab draw. This RN advised patient to keep bandage after patient removed it. Patient remains safe at this time.

## 2022-01-29 NOTE — Progress Notes (Signed)
1:1 Note Pt resting in bed with eyes closed, staff in the room with pt, will continue to monitor.

## 2022-01-29 NOTE — Progress Notes (Signed)
Patient denies SI/HI/AVH and pain. Patient reports that he slept well. He remains in his room under 1:1 observation. Continued encouragement of PO fluids d/t hypotension yesterday. Patient remains safe at this time.

## 2022-01-29 NOTE — Progress Notes (Signed)
Nurse met with patient face-to-face in his room. Calm and cooperative on approach but frequently complaining of being here against his will. Patient denies SI/HI and states I am poor...thats why I am here. I am here because I have no means to defend myself.Marland KitchenMarland KitchenThey want to put everything on me...".  Patient reports that he is ready to go home and his goals are to "stay out of trouble, go back to work and get along with my father".  Patient was guarded and argumentative during this encounter. He was minimizing his mental health problem and reported that "people just don't like me".  He reports that he had a good visit with his father. However, nursing reported that it did not go well and father had to leave. He was not cooperative with lab work and did not allow tech to complete blood draw. He denies hallucinations, SI and HI. He reports not having any problem taking his medications. Patient remains on 1:1 observations following aggressive, unpredicted behaviors. Emotional support provided.

## 2022-01-29 NOTE — Group Note (Signed)
Recreation Therapy Group Note   Group Topic:Communication  Group Date: 01/29/2022 Start Time: 1000 End Time: 1038 Facilitators: Lota Leamer-McCall, LRT,CTRS Location: [address] Dayroom   Goal Area(s) Addresses:  Patient will effectively listen to complete activity.  Patient will identify communication skills used to make activity successful.  Patient will identify how skills used during activity can be used to reach post d/c goals.      Group Description: Geometric Drawings.  Three volunteers from the peer group will be shown an abstract picture with a particular arrangement of geometrical shapes.  Each round, one 'speaker' will describe the pattern, as accurately as possible without revealing the image to the group.  The remaining group members will listen and draw the picture to reflect how it is described to them. Patients with the role of 'listener' cannot ask clarifying questions but, may request that the speaker repeat a direction. Once the drawings are complete, the presenter will show the rest of the group the picture and compare how close each person came to drawing the picture. LRT will facilitate a post-activity discussion regarding effective communication and the importance of planning, listening, and asking for clarification in daily interactions with others.   Affect/Mood: N/A   Participation Level: Did not attend    Clinical Observations/Individualized Feedback:     Plan: Continue to engage patient in RT group sessions 2-3x/week.   Keliah Harned-McCall, LRT,CTRS 01/29/2022 12:16 PM

## 2022-01-29 NOTE — Progress Notes (Signed)
1:1 Note.  Pt observed in beds a sleep with even and unlabored respirations, no distress noted, remains on 1:1 for safety, will continue to monitor.

## 2022-01-29 NOTE — Progress Notes (Signed)
Patient educated on new medication and verbalized understanding. Patient expressed concerns about responsibilities waiting for him at home. This RN encouraged patient to focus on his care and remaining safe. Patient remains in his room under 1:1 observation. Patient remains safe at this time.
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
## 2022-01-29 NOTE — Progress Notes (Signed)
1:1 Note Pt has been in the room froth the beginning of the shift, no behavioral issues noted. Pt took all his night time meds without any problems. Reported a good day. Pt encouraged to drink more fluids due to low BP. Pt remains safe on the unit, continue to be on 1:1, will continue to monitor.

## 2022-01-29 NOTE — [facility] Counselor (Signed)
[facility] LCSW Progress Note   01/29/2022    2:06 PM  [name]   [id number]   Type of Contact and Topic:  Medicaid and Disability  CSW called first [employer] through the hospital system to see if they can assist to apply for medicaid and disability.  They report that they only assist people without insurance and advised [place] that patient guardian will need to go and apply for medicaid either online or at [place] office.  They also recommended that guardian start process of applying for disability so that they can get full benefits.   CSW informed patient guardian who understands that he needs to go to DSS to apply for medicaid.  At this time, patient does not have the benefits in place for a group home placement.   Signed:  Riki Altes MSW, LCSW, LCAS 01/29/2022 2:06 PM

## 2022-01-29 NOTE — Progress Notes (Signed)
   01/28/22 2100  Psych Admission Type (Psych Patients Only)  Admission Status Involuntary  Psychosocial Assessment  Patient Complaints Anxiety  Eye Contact Fair  Facial Expression Animated  Affect Anxious  Speech Logical/coherent  Interaction Assertive  Motor Activity Fidgety  Appearance/Hygiene Unremarkable  Behavior Characteristics Fidgety  Mood Depressed  Thought Process  Coherency Circumstantial  Content WDL  Delusions None reported or observed  Perception WDL  Hallucination None reported or observed  Judgment Impaired  Confusion None  Danger to Self  Current suicidal ideation? Denies  Agreement Not to Harm Self Yes  Description of Agreement verbal  Danger to Others  Danger to Others None reported or observed

## 2022-01-30 ENCOUNTER — Encounter (HOSPITAL_COMMUNITY): Payer: Self-pay

## 2022-01-30 DIAGNOSIS — F200 Paranoid schizophrenia: Secondary | ICD-10-CM | POA: Diagnosis not present

## 2022-01-30 MED ORDER — FLUPHENAZINE HCL 10 MG PO TABS
10.0000 mg | ORAL_TABLET | Freq: Two times a day (BID) | ORAL | Status: DC
  Administered 2022-01-30 – 2022-01-31 (×3): 10 mg via ORAL
  Filled 2022-01-30 (×6): qty 1

## 2022-01-30 MED ORDER — BENZTROPINE MESYLATE 1 MG PO TABS
1.0000 mg | ORAL_TABLET | Freq: Two times a day (BID) | ORAL | Status: DC
  Administered 2022-01-30 – 2022-02-08 (×18): 1 mg via ORAL
  Filled 2022-01-30 (×4): qty 1
  Filled 2022-01-30: qty 20
  Filled 2022-01-30: qty 1
  Filled 2022-01-30: qty 20
  Filled 2022-01-30: qty 1
  Filled 2022-01-30: qty 20
  Filled 2022-01-30 (×2): qty 1
  Filled 2022-01-30: qty 20
  Filled 2022-01-30 (×14): qty 1

## 2022-01-30 NOTE — Progress Notes (Signed)
[facility] MD Progress Note  01/30/2022 11:22 AM Brendan Hogan  MRN:  [id number]   Reason for Admission:  Brendan Hogan is 26 y.o. male who presented to [facility] under IVC from [facility] ED for evaluation and management of worsening psychosis.  The patient is currently on Hospital Day 10.   Chart Review from last 24 hours:  The patient's chart was reviewed and nursing notes were reviewed. The patient's case was discussed in multidisciplinary team meeting. Per [facility] patient is compliant with scheduled medications including scheduled psychotropic medications Depakote 750 mg every 12 hours, Prolixin 5 mg twice daily started yesterday, clonidine 0.1 mg twice daily, Cogentin 0.5 mg twice daily, Ativan 1 mg twice daily and Remeron 15 mg at bedtime.  Per [facility] patient was given as needed Atarax this morning at 810 for restlessness and irritability.  Patient continues to be on one-to-one precaution secondary to irritability and agitation as well as impulsivity being unpredictable with history of aggression and agitation prior to hospitalization and since hospitalized.  Staff report patient secluding himself to his room with no group attendance or participation in the milieu. Per staff patient refused lab draw after staff failed first attempt got irritable and refused any cooperation further.  Information Obtained Today During Patient Interview: The patient was seen and evaluated on the unit. On assessment today the patient is lying down in bed, one-to-one staff at bedside, patient is preoccupied wanting discharge tells me that his father visited yesterday and the visit was "wonderful".  Staff patient was irritable and agitated with the father when father visited and father was asked to leave the unit as patient was getting verbally and physically agitated, patient denies that without providing details.  Patient presents irritable suspicious and paranoid continues to report he does not want to go to the dayroom or attend  groups and does not want to take a shower "I want to shower at home" as a discussion goes on he gets more irritable and agitated as I discussed with him plan of care and recommendation to attend groups and participate in the milieu to start making discharge plan.  Patient denies side effect to medications and has been compliant with medicine on the unit, does not report and does not display any signs consistent with EPS or TD.  Reports good sleep and appetite and denies SI HI or AVH, does not present responding to stimuli but presents paranoid and suspicious with some disorganized thoughts noted at times.   Sleep  Sleep: Fair sleep reported  Principal Problem: Schizophrenia (HCC) Diagnosis: Principal Problem:   Schizophrenia (HCC)    Past Psychiatric History: See H&P  Past Medical History:  Past Medical History:  Diagnosis Date   Psychosis (HCC) 05/2019   [facility]    Seasonal allergies     Past Surgical History:  Procedure Laterality Date   TONSILLECTOMY     Family History:  Family History  Problem Relation Age of Onset   Hypertension Mother    Family Psychiatric  History: See H&P Social History: See H&P  Sleep: Fair  Appetite: Fair  Current Medications: Current Facility-Administered Medications  Medication Dose Route Frequency Provider Last Rate Last Admin   acetaminophen (TYLENOL) tablet 650 mg  650 mg Oral Q6H PRN Jackelyn Poling, NP       alum & mag hydroxide-simeth (MAALOX/MYLANTA) 200-200-20 MG/5ML suspension 30 mL  30 mL Oral Q4H PRN Jackelyn Poling, NP       benztropine (COGENTIN) tablet  0.5 mg  0.5 mg Oral BID Mason Jim, Amy E, MD   0.5 mg at 01/30/22 0810   cloNIDine (CATAPRES) tablet 0.1 mg  0.1 mg Oral BID Park Pope, MD   0.1 mg at 01/30/22 0810   diphenhydrAMINE (BENADRYL) injection 50 mg  50 mg Intramuscular Q6H PRN Comer Locket, MD       divalproex (DEPAKOTE) DR tablet 750 mg  750 mg Oral Q12H Thalia Party, MD   750 mg at 01/30/22 0810    docusate sodium (COLACE) capsule 100 mg  100 mg Oral Daily PRN Comer Locket, MD       fluPHENAZine (PROLIXIN) tablet 5 mg  5 mg Oral Q12H Lamar Sprinkles, MD   5 mg at 01/30/22 0810   hydrOXYzine (ATARAX) tablet 25 mg  25 mg Oral TID PRN Karel Jarvis E, PA   25 mg at 01/30/22 0811   LORazepam (ATIVAN) tablet 1 mg  1 mg Oral Q6H PRN Comer Locket, MD       Or   LORazepam (ATIVAN) injection 2 mg  2 mg Intramuscular Q6H PRN Comer Locket, MD       LORazepam (ATIVAN) tablet 1 mg  1 mg Oral BID Mason Jim, Amy E, MD   1 mg at 01/30/22 0810   magnesium hydroxide (MILK OF MAGNESIA) suspension 30 mL  30 mL Oral Daily PRN Jackelyn Poling, NP       mirtazapine (REMERON) tablet 15 mg  15 mg Oral QHS Nira Conn A, NP   15 mg at 01/29/22 2030   nicotine polacrilex (NICORETTE) gum 2 mg  2 mg Oral PRN Jackelyn Poling, NP   2 mg at 01/29/22 0958   OLANZapine (ZYPREXA) injection 5 mg  5 mg Intramuscular Q6H PRN Comer Locket, MD       Or   OLANZapine zydis (ZYPREXA) disintegrating tablet 10 mg  10 mg Oral Q6H PRN Comer Locket, MD       zolpidem (AMBIEN) tablet 10 mg  10 mg Oral QHS PRN Phineas Inches, MD   10 mg at 01/29/22 2059    Lab Results:  Results for orders placed or performed during the hospital encounter of 01/20/22 (from the past 48 hour(s))  CBC with Differential/Platelet     Status: None   Collection Time: 01/28/22  6:57 PM  Result Value Ref Range   WBC 5.7 4.0 - 10.5 K/uL   RBC 5.20 4.22 - 5.81 MIL/uL   Hemoglobin 14.9 13.0 - 17.0 g/dL   HCT 34.1 96.2 - 22.9 %   MCV 89.6 80.0 - 100.0 fL   MCH 28.7 26.0 - 34.0 pg   MCHC 32.0 30.0 - 36.0 g/dL   RDW 79.8 92.1 - 19.4 %   Platelets 223 150 - 400 K/uL   nRBC 0.0 0.0 - 0.2 %   Neutrophils Relative % 52 %   Neutro Abs 3.0 1.7 - 7.7 K/uL   Lymphocytes Relative 36 %   Lymphs Abs 2.0 0.7 - 4.0 K/uL   Monocytes Relative 8 %   Monocytes Absolute 0.5 0.1 - 1.0 K/uL   Eosinophils Relative 2 %   Eosinophils Absolute 0.1 0.0 -  0.5 K/uL   Basophils Relative 1 %   Basophils Absolute 0.0 0.0 - 0.1 K/uL   Immature Granulocytes 1 %   Abs Immature Granulocytes 0.04 0.00 - 0.07 K/uL    Comment: Performed at [facility], [address], [place], [place] [address]  Valproic  acid level     Status: None   Collection Time: 01/28/22  6:57 PM  Result Value Ref Range   Valproic Acid Lvl 66 50.0 - 100.0 ug/mL    Comment: Performed at [facility], [address] [place], [place] [address]    Blood Alcohol level:  Lab Results  Component Value Date   ETH <10 01/08/2022   ETH <10 12/26/2021    Metabolic Disorder Labs: Lab Results  Component Value Date   HGBA1C 4.8 01/23/2022   MPG 91.06 01/23/2022   MPG 100 05/19/2021   Lab Results  Component Value Date   PROLACTIN 26.5 (H) 09/08/2019   Lab Results  Component Value Date   CHOL 151 01/23/2022   TRIG 53 01/23/2022   HDL 49 01/23/2022   CHOLHDL 3.1 01/23/2022   VLDL 11 01/23/2022   LDLCALC 91 01/23/2022   LDLCALC 62 05/19/2021    Physical Findings: AIMS: Facial and Oral Movements Muscles of Facial Expression: None, normal Lips and Perioral Area: None, normal Jaw: None, normal Tongue: None, normal,Extremity Movements Upper (arms, wrists, hands, fingers): None, normal Lower (legs, knees, ankles, toes): None, normal, Trunk Movements Neck, shoulders, hips: None, normal, Overall Severity Severity of abnormal movements (highest score from questions above): None, normal Incapacitation due to abnormal movements: None, normal Patient's awareness of abnormal movements (rate only patient's report): No Awareness, Dental Status Current problems with teeth and/or dentures?: No Does patient usually wear dentures?: No  CIWA:    COWS:     Musculoskeletal: Strength & Muscle Tone: within normal limits Gait & Station: normal Patient leans: N/A  Psychiatric Specialty Exam:  General Appearance: Disheveled and unkempt, poor  hygiene noted, lying down in bed  Behavior: Irritable and guarded  Psychomotor Activity: Mild to moderate agitation noted  Eye Contact: Limited Speech: Decreased amount, normal tone and volume   Mood: Irritable Affect: Irritable  Thought Process: Preoccupied with wanting to go home, concrete, occasionally disorganized Descriptions of Associations: Concrete Thought Content: Hallucinations: Denies AH, VH, does not appear responding to stimuli Delusions: Presents paranoid and suspicious with some bizarre delusions noted regarding parents with some paranoia from them as well Suicidal Thoughts: Denies SI, intention, plan  Homicidal Thoughts: Denies HI, intention, plan   Alertness/Orientation: Alert and partially oriented  Insight: Poor Judgment: Very limited, compliant with medications on the unit  Memory: Limited  Executive Functions  Concentration: Easily distracted Attention Span: Limited Recall: Limited Fund of Knowledge: Limited   Assets  Assets: Housing; Physical Health    Physical Exam: Physical Exam ROS Blood pressure 109/80, pulse 88, temperature 97.9 F (36.6 C), temperature source Oral, resp. rate 18, height 5\' 5"  (1.651 m), weight 59.3 kg, SpO2 100 %. Body mass index is 21.77 kg/m.   Treatment Plan Summary:  ASSESSMENT:  Diagnoses / Active Problems: Principal Problem: Schizophrenia (HCC) Diagnosis: Principal Problem:   Schizophrenia (HCC)   PLAN: Safety and Monitoring:  -- Involuntary admission to inpatient psychiatric unit for safety, stabilization and treatment  -- Daily contact with patient to assess and evaluate symptoms and progress in treatment  -- Patient's case to be discussed in multi-disciplinary team meeting  -- Observation Level : q15 minute checks  -- Vital signs:  q12 hours  -- Precautions: suicide, elopement, and assault  2. Medications:   Titrate Prolixin from 5 mg twice daily to 10 mg twice daily to target psychosis and  agitation  Continue Depakote 750 mg twice daily for mood stabilization and to augment antipsychotic effect  Continue Catapres 0.1 mg twice daily for impulsivity  Titrate Cogentin to 1 mg twice daily for EPS prophylaxis  Continue Ativan 1 mg twice daily for irritability  Continue Remeron 15 mg at bedtime for sleep and appetite  Continue Ativan and Zyprexa as needed for agitation  Continue Ambien as needed for sleep  Continue Atarax as needed for anxiety  The risks/benefits/side-effects/alternatives to this medication were discussed in detail with the patient and time was given for questions. The patient consents to medication trial.    -- Metabolic profile and EKG monitoring obtained while on an atypical antipsychotic (BMI: Lipid Panel: HbgA1c: QTc:)      3. Pertinent labs: AST 34, ALT 15 on 9/12 and AST 17, ALT 14 today 10/1; VPA level low at 16 on 10/1.  Repeat VPA 66 on 10/2, EKG normal sinus rhythm, QTc 413      Lab ordered: Depakote level repeat tonight, ammonia level and LFT ensure therapeutic level and no toxicity   4. Tobacco Use Disorder  -- Continue Nicorette gum as needed  -- Smoking cessation encouraged  5. Group and Therapy: -- Encouraged patient to participate in unit milieu and in scheduled group therapies       -- Short Term Goals: Ability to identify changes in lifestyle to reduce recurrence of condition will improve, Ability to verbalize feelings will improve, Ability to disclose and discuss suicidal ideas, Ability to demonstrate self-control will improve, and Ability to identify and develop effective coping behaviors will improve  -- Long Term Goals: Improvement in symptoms so as ready for discharge  6. Discharge Planning:   -- Social work and case management to assist with discharge planning and identification of hospital follow-up needs prior to discharge  -- Estimated LOS: 5-7 days  -- Discharge Concerns: Need to establish a safety plan; Medication compliance and  effectiveness  -- Discharge Goals: Return home with outpatient referrals for mental health follow-up including medication management/psychotherapy      Total Time Spent in Direct Patient Care:  I personally spent 35 minutes on the unit in direct patient care. The direct patient care time included face-to-face time with the patient, reviewing the patient's chart, communicating with other professionals, and coordinating care. Greater than 50% of this time was spent in counseling or coordinating care with the patient regarding goals of hospitalization, psycho-education, and discharge planning needs.   Othon Guardia Winfred Leeds, MD 01/30/2022, 11:22 AM

## 2022-01-30 NOTE — Progress Notes (Signed)
   01/30/22 0900  Psych Admission Type (Psych Patients Only)  Admission Status Involuntary  Psychosocial Assessment  Patient Complaints Anxiety  Eye Contact Darting  Facial Expression Animated  Affect Anxious;Apprehensive;Irritable  Speech Logical/coherent  Interaction Assertive;Defensive;Guarded  Motor Activity Fidgety  Appearance/Hygiene Disheveled  Behavior Characteristics Agressive verbally;Agitated;Anxious;Fidgety;Guarded;Irritable  Mood Anxious;Suspicious;Irritable  Thought Process  Coherency Circumstantial  Content WDL  Delusions None reported or observed  Perception WDL  Hallucination None reported or observed  Judgment Impaired  Confusion None  Danger to Self  Current suicidal ideation? Denies  Agreement Not to Harm Self Yes  Description of Agreement verbal  Danger to Others  Danger to Others None reported or observed

## 2022-01-30 NOTE — Group Note (Signed)
Recreation Therapy Group Note   Group Topic:Coping Skills  Group Date: 01/30/2022 Start Time: 1003 End Time: 1049 Facilitators: Simuel Stebner-McCall, LRT,CTRS Location: 500 Hall Dayroom   Goal Area(s) Addresses:  Patient will identify positive coping skills techniques. Patient will identify benefits of using coping skills post d/c.  Group Description:  Mind Map.  Patient was provided a blank template of a diagram with 32 blank boxes in a tiered system, branching from the center (similar to a bubble chart). LRT directed patients to label the middle of the diagram "Coping Skills" and consider 8 different sources in coping skills could be used.  LRT and patients recorded those sources (anger, frustration, hate, anxiety, A/V hallucinations, stress, depression and grief)  in the 2nd tier boxes closest to the center. Patients were to then come up with 3 effective coping skills address each identified area in the remaining boxes. LRT would right the answers on the board so patients could see what each other came up with.   Affect/Mood: N/A   Participation Level: Did not attend    Clinical Observations/Individualized Feedback:     Plan: Continue to engage patient in RT group sessions 2-3x/week.   Brendan Hogan, LRT,CTRS 01/30/2022 12:44 PM

## 2022-01-30 NOTE — BH IP Treatment Plan (Addendum)
Interdisciplinary Treatment and Diagnostic Plan Update  01/30/2022 Time of Session: 0830 Brendan Hogan MRN: [id number]  Principal Diagnosis: Schizophrenia [facility])  Secondary Diagnoses: Principal Problem:   Schizophrenia (HCC)   Current Medications:  Current Facility-Administered Medications  Medication Dose Route Frequency Provider Last Rate Last Admin   acetaminophen (TYLENOL) tablet 650 mg  650 mg Oral Q6H PRN Jackelyn Poling, NP       alum & mag hydroxide-simeth (MAALOX/MYLANTA) 200-200-20 MG/5ML suspension 30 mL  30 mL Oral Q4H PRN Nira Conn A, NP       benztropine (COGENTIN) tablet 1 mg  1 mg Oral BID Abbott Pao, Nadir, MD       cloNIDine (CATAPRES) tablet 0.1 mg  0.1 mg Oral BID Park Pope, MD   0.1 mg at 01/30/22 0810   diphenhydrAMINE (BENADRYL) injection 50 mg  50 mg Intramuscular Q6H PRN Comer Locket, MD       divalproex (DEPAKOTE) DR tablet 750 mg  750 mg Oral Q12H Paliy, Serina Cowper, MD   750 mg at 01/30/22 0810   docusate sodium (COLACE) capsule 100 mg  100 mg Oral Daily PRN Comer Locket, MD       fluPHENAZine (PROLIXIN) tablet 10 mg  10 mg Oral Q12H Attiah, Nadir, MD       hydrOXYzine (ATARAX) tablet 25 mg  25 mg Oral TID PRN Nwoko, Uchenna E, PA   25 mg at 01/30/22 0811   LORazepam (ATIVAN) tablet 1 mg  1 mg Oral Q6H PRN Comer Locket, MD       Or   LORazepam (ATIVAN) injection 2 mg  2 mg Intramuscular Q6H PRN Mason Jim, Amy E, MD       LORazepam (ATIVAN) tablet 1 mg  1 mg Oral BID Singleton, Amy E, MD   1 mg at 01/30/22 0810   magnesium hydroxide (MILK OF MAGNESIA) suspension 30 mL  30 mL Oral Daily PRN Nira Conn A, NP       mirtazapine (REMERON) tablet 15 mg  15 mg Oral QHS Nira Conn A, NP   15 mg at 01/29/22 2030   nicotine polacrilex (NICORETTE) gum 2 mg  2 mg Oral PRN Nira Conn A, NP   2 mg at 01/29/22 0958   OLANZapine (ZYPREXA) injection 5 mg  5 mg Intramuscular Q6H PRN Comer Locket, MD       Or   OLANZapine zydis (ZYPREXA) disintegrating tablet  10 mg  10 mg Oral Q6H PRN Comer Locket, MD       zolpidem (AMBIEN) tablet 10 mg  10 mg Oral QHS PRN Massengill, Harrold Donath, MD   10 mg at 01/29/22 2059   PTA Medications: Medications Prior to Admission  Medication Sig Dispense Refill Last Dose   ARIPiprazole (ABILIFY) 20 MG tablet Take 1 tablet (20 mg total) by mouth daily. (Patient not taking: Reported on 01/08/2022) 30 tablet 1    mirtazapine (REMERON) 15 MG tablet Take 1 tablet (15 mg total) by mouth at bedtime. (Patient not taking: Reported on 01/08/2022) 30 tablet 1    traZODone (DESYREL) 50 MG tablet Take 50 mg by mouth at bedtime. (Patient not taking: Reported on 01/08/2022)      zolpidem (AMBIEN) 10 MG tablet Take 1 tablet (10 mg total) by mouth at bedtime. (Patient not taking: Reported on 01/08/2022) 2 tablet 0     Patient Stressors: Other: Patient identifies no stressors. He states he has been off of his medications and "it was all blown out of  proportion."    Patient Strengths: Ability for insight  Supportive family/friends   Treatment Modalities: Medication Management, Group therapy, Case management,  1 to 1 session with clinician, Psychoeducation, Recreational therapy.   Physician Treatment Plan for Primary Diagnosis: Schizophrenia ([place]) Long Term Goal(s): Improvement in symptoms so as ready for discharge   Short Term Goals: Ability to identify changes in lifestyle to reduce recurrence of condition will improve Ability to verbalize feelings will improve Ability to disclose and discuss suicidal ideas Ability to demonstrate self-control will improve Ability to identify and develop effective coping behaviors will improve  Medication Management: Evaluate patient's response, side effects, and tolerance of medication regimen.  Therapeutic Interventions: 1 to 1 sessions, Unit Group sessions and Medication administration.  Evaluation of Outcomes: Progressing  Physician Treatment Plan for Secondary Diagnosis: Principal Problem:    Schizophrenia ([place])  Long Term Goal(s): Improvement in symptoms so as ready for discharge   Short Term Goals: Ability to identify changes in lifestyle to reduce recurrence of condition will improve Ability to verbalize feelings will improve Ability to disclose and discuss suicidal ideas Ability to demonstrate self-control will improve Ability to identify and develop effective coping behaviors will improve     Medication Management: Evaluate patient's response, side effects, and tolerance of medication regimen.  Therapeutic Interventions: 1 to 1 sessions, Unit Group sessions and Medication administration.  Evaluation of Outcomes: Progressing   RN Treatment Plan for Primary Diagnosis: Schizophrenia ([place]) Long Term Goal(s): Knowledge of disease and therapeutic regimen to maintain health will improve  Short Term Goals: Ability to remain free from injury will improve, Ability to verbalize frustration and anger appropriately will improve, Ability to demonstrate self-control, Ability to participate in decision making will improve, Ability to verbalize feelings will improve, Ability to disclose and discuss suicidal ideas, Ability to identify and develop effective coping behaviors will improve, and Compliance with prescribed medications will improve  Medication Management: RN will administer medications as ordered by provider, will assess and evaluate patient's response and provide education to patient for prescribed medication. RN will report any adverse and/or side effects to prescribing provider.  Therapeutic Interventions: 1 on 1 counseling sessions, Psychoeducation, Medication administration, Evaluate responses to treatment, Monitor vital signs and CBGs as ordered, Perform/monitor CIWA, COWS, AIMS and Fall Risk screenings as ordered, Perform wound care treatments as ordered.  Evaluation of Outcomes: Progressing   LCSW Treatment Plan for Primary Diagnosis: Schizophrenia ([place]) Long Term  Goal(s): Safe transition to appropriate next level of care at discharge, Engage patient in therapeutic group addressing interpersonal concerns.  Short Term Goals: Engage patient in aftercare planning with referrals and resources, Increase social support, Increase ability to appropriately verbalize feelings, Increase emotional regulation, Facilitate acceptance of mental health diagnosis and concerns, Facilitate patient progression through stages of change regarding substance use diagnoses and concerns, Identify triggers associated with mental health/substance abuse issues, and Increase skills for wellness and recovery  Therapeutic Interventions: Assess for all discharge needs, 1 to 1 time with Social worker, Explore available resources and support systems, Assess for adequacy in community support network, Educate family and significant other(s) on suicide prevention, Complete Psychosocial Assessment, Interpersonal group therapy.  Evaluation of Outcomes: Progressing   Progress in Treatment: Attending groups: No. Participating in groups: No. Taking medication as prescribed: Yes. Toleration medication: Yes. Family/Significant other contact made: Yes, individual(s) contacted:  Kayman Snuffer (father) -[phone] Patient understands diagnosis: No. Discussing patient identified problems/goals with staff: Yes. Medical problems stabilized or resolved: Yes. Denies suicidal/homicidal ideation: No. Issues/concerns per patient  self-inventory: Yes. Other: none  New problem(s) identified: No, Describe:  none  New Short Term/Long Term Goal(s): Patient to work towards detox, elimination of symptoms of psychosis, medication management for mood stabilization; elimination of SI thoughts; development of comprehensive mental wellness/sobriety plan.  Patient Goals:  No additional goals identified at this time. Patient to continue to work towards original goals identified in initial treatment team meeting. CSW  will remain available to patient should they voice additional treatment goals.   Discharge Plan or Barriers: Patient's guardian feels unable to care for patient in his home, requests group home placement despite patient not having adequate funding for such service.   Reason for Continuation of Hospitalization: Other; describe psychosis   Estimated Length of Stay: TBD   Last 3 [place] Suicide Severity Risk Score: Flowsheet Row [facility]Current[facility]08[facility]2022 [facility] from 12/26/2021 in [facility] EM[facility]GENC[facility]PART[facility]  C-SSRS RISK CATEGORY No Risk Moderate Risk High Risk       Last PHQ 2/9 Scores:    07/19/2020    3:12 PM 05/29/2020    7:35 AM  Depression screen PHQ 2/9  Decreased Interest 1 0  Down, Depressed, Hopeless 1 1  PHQ - 2 Score 2 1  Altered sleeping 2   Tired, decreased energy 1   Change in appetite 1   Feeling bad or failure about yourself  1   Trouble concentrating 1   Moving slowly or fidgety/restless 0   Suicidal thoughts 0   PHQ-9 Score 8     Scribe for Treatment Team: Almedia Balls 01/30/2022 12:29 PM

## 2022-01-30 NOTE — Group Note (Unsigned)
LCSW Group Therapy Note  Group Date: 01/30/2022 Start Time: 1300 End Time: 1345   Type of Therapy and Topic:  Group Therapy: Anger Cues and Responses  Participation Level:  {[facility] PARTICIPATION LEVEL:22264}   Description of Group:   In this group, patients learned how to recognize the physical, cognitive, emotional, and behavioral responses they have to anger-provoking situations.  They identified a recent time they became angry and how they reacted.  They analyzed how their reaction was possibly beneficial and how it was possibly unhelpful.  The group discussed a variety of healthier coping skills that could help with such a situation in the future.  Focus was placed on how helpful it is to recognize the underlying emotions to our anger, because working on those can lead to a more permanent solution as well as our ability to focus on the important rather than the urgent.  Therapeutic Goals: 1. Patients will remember their last incident of anger and how they felt emotionally and physically, what their thoughts were at the time, and how they behaved. 2. Patients will identify how their behavior at that time worked for them, as well as how it worked against them. 3. Patients will explore possible new behaviors to use in future anger situations. 4. Patients will learn that anger itself is normal and cannot be eliminated, and that healthier reactions can assist with resolving conflict rather than worsening situations.  Summary of Patient Progress:  *** was active during the group. ***he shared a recent occurrence wherein feeling *** led to anger. ***he demonstrated *** insight into the subject matter, was respectful of peers, and participated throughout the entire session.  Therapeutic Modalities:   Cognitive Behavioral Therapy    Taleigha Pinson S Hughie Melroy, LCSWA 01/30/2022  2:59 PM    

## 2022-01-30 NOTE — Progress Notes (Signed)
Pt's mood remains labile, he's still suspicious, paranoid and argumentative and oppositional with blunted affect, tense with clenched fists on interactions. Took his evening medications with multiple prompts "I don't need it, I'll come when everyone leaves". Out of room briefly to make a phone call, refused to go to the dayroom and went back to his room. Continued support and encouragement offered to pt. 1:1 observation maintained for safety with assigned staff in attendance at all times.

## 2022-01-30 NOTE — Group Note (Signed)
[facility] LCSW Group Therapy Note  Date/Time: 01/30/2022 @ 1pm  Type of Therapy and Topic:  Group Therapy:  Who Am I?  Self Esteem, Self-Actualization and Understanding Self.  Participation Level:  Did not attend  Description of Group:    In this group patients will be asked to explore values, beliefs, truths, and morals as they relate to personal self.  Patients will be guided to discuss their thoughts, feelings, and behaviors related to what they identify as important to their true self. Patients will process together how values, beliefs and truths are connected to specific choices patients make every day. Each patient will be challenged to identify changes that they are motivated to make in order to improve self-esteem and self-actualization. This group will be process-oriented, with patients participating in exploration of their own experiences as well as giving and receiving support and challenge from other group members.  Therapeutic Goals: Patient will identify false beliefs that currently interfere with their self-esteem.  Patient will identify feelings, thought process, and behaviors related to self and will become aware of the uniqueness of themselves and of others.  Patient will be able to identify and verbalize values, morals, and beliefs as they relate to self. Patient will begin to learn how to build self-esteem/self-awareness by expressing what is important and unique to them personally.  Summary of Patient Progress:Patient did not attend.              Therapeutic Modalities:   Cognitive Behavioral Therapy Solution Focused Therapy Motivational Interviewing Brief Therapy    Napolean Sia, LCSW, [place] Social Worker  [facility]

## 2022-01-30 NOTE — Progress Notes (Signed)
D: Patient irritable and agitated this morning. Cursing at staff. Initially refusing to come take his medications.   A: PRN's given. Patient remains on 1:1 for safety.   R: Patient remains safe in his room. No distress noted.

## 2022-01-30 NOTE — Progress Notes (Signed)
D: Patient is alert and oriented. In his room having lunch. No distress noted.  A: Patient remains on 1:1 for safety.  R: Patient remains safe.

## 2022-01-30 NOTE — [facility] Counselor (Signed)
[facility] LCSW Progress Note   01/30/2022    8:45 AM  Brendan Hogan   [id number]   Type of Contact and Topic:  Referrals  CSW placed referral to psychotherapeutic services [facility] on 10/3.  Currently 3/4 month wait to get connected to services.   Case Manager put in referral to [facility] .  Referral placed on 10/3.   Signed:  Riki Altes, MSW, LCSW, LCAS 01/30/2022 8:45 AM

## 2022-01-30 NOTE — Progress Notes (Signed)
Patient did not attend morning orientation/goal setting group 

## 2022-01-31 DIAGNOSIS — F200 Paranoid schizophrenia: Secondary | ICD-10-CM | POA: Diagnosis not present

## 2022-01-31 NOTE — Progress Notes (Signed)
1:1 note  Pt alert and awake in bed reading a book. Pt calm and cooperative.   Pt remains on 1:1 for safety.  Pt stable. No acute distress noted.
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
## 2022-01-31 NOTE — Group Note (Signed)
Recreation Therapy Group Note   Group Topic:Problem Solving  Group Date: 01/31/2022 Start Time: 1000 End Time: 1035 Facilitators: Antoniette Peake-McCall, LRT,CTRS Location: 500 [address] Dayroom   Goal Area(s) Addresses:  Patient will effectively work with peer towards shared goal.  Patient will identify skills used to make activity successful.  Patient will share challenges and verbalize solution-driven approaches used. Patient will identify how skills used during activity can be used to reach post d/c goals.   Group Description: [employer]. Patients were provided the following materials: 5 drinking straws, 5 rubber bands, 5 paper clips, 2 index cards and 2 drinking cups. Using the provided materials patients were asked to build a launching mechanism to launch a ping pong ball across the room, approximately 10 feet. Patients were divided into teams of 3-5. Instructions required all materials be incorporated into the device, functionality of items left to the peer group's discretion.   Affect/Mood: N/A   Participation Level: Did not attend    Clinical Observations/Individualized Feedback:     Plan: Continue to engage patient in RT group sessions 2-3x/week.   Alexcis Bicking-McCall, LRT,CTRS 01/31/2022 12:13 PM

## 2022-01-31 NOTE — Progress Notes (Signed)
D- Patient alert and oriented x4. Patient in stable mood. Pt was agitated prior at visitation but returned to baseline after his visitor left. Denies SI, HI, AVH.    A- Scheduled medications administered to patient, per MD orders.Routine safety checks conducted every 15 minutes. Pt remains on 1:1 due to labile behavior and risk for harm to others. Patient informed to notify staff with problems or concerns.   R- Patient compliant with medications and verbalized understanding treatment plan. Patient receptive, calm, and cooperative.    01/30/22 2025  Psych Admission Type (Psych Patients Only)  Admission Status Involuntary  Psychosocial Assessment  Patient Complaints Anxiety  Eye Contact Fair  Facial Expression Animated;Anxious  Affect Anxious  Speech Logical/coherent  Interaction Defensive;Guarded  Motor Activity Fidgety  Appearance/Hygiene Disheveled  Behavior Characteristics Anxious;Guarded;Fidgety  Mood Anxious;Suspicious  Thought Process  Coherency Circumstantial  Content WDL  Delusions None reported or observed  Perception WDL  Hallucination None reported or observed  Judgment Impaired  Confusion None  Danger to Self  Current suicidal ideation? Denies  Agreement Not to Harm Self Yes  Description of Agreement [place] Contract  Danger to Others  Danger to Others None reported or observed

## 2022-01-31 NOTE — Progress Notes (Signed)
1:1 Note  Pt in bed attempting to go to sleep.Pt verbalized issues trying to fall asleep. Pt calm and cooperative.  Pt remains on 1:1 for safety.   Pt stable. No acute distress noted.

## 2022-01-31 NOTE — Progress Notes (Signed)
1:1 Note: Patient remains on continuous supervision with MHT in doorway. Routine safety checks maintained. Adequate nourishment. Pt remains safe on the unit within his room. Regular respirations, no s/s of distress noted.

## 2022-01-31 NOTE — Progress Notes (Signed)
1:1 Note: Patient maintained on constant supervision for safety.  Patient in his room resting in bed.  Denies suicidal thoughts, auditory and visual hallucinations.  Routine safety checks maintained.  Patient is safe on the unit with supervision.

## 2022-01-31 NOTE — Progress Notes (Signed)
The focus of this group is to help patients review their daily goal of treatment and discuss progress on daily workbooks. Pt did not attend the evening group. 

## 2022-01-31 NOTE — Progress Notes (Signed)
1:1 Note: Patient maintained on constant supervision for safety. Patient is calm and appropriate.  Medication given as prescribed. Routine safety checks maintained.  Food and fluid intake adequate.  Patient is safe on the unit with supervision.

## 2022-01-31 NOTE — Progress Notes (Signed)
1:1 Note  Pt resting comfortably. Respirations equal and unlabored.   Pt remains on 1:1 for safety.   Pt stable. No acute distress noted.

## 2022-01-31 NOTE — Progress Notes (Signed)
1:1 Note: Patient maintained on constant supervision for safety.  Patient remained in his room for majority of this shift.  Routine safety checks maintained.  Medications given as prescribed.  Patient is safe on the unit with supervision.

## 2022-02-01 DIAGNOSIS — F200 Paranoid schizophrenia: Secondary | ICD-10-CM | POA: Diagnosis not present

## 2022-02-01 MED ORDER — FLUPHENAZINE HCL 10 MG PO TABS
10.0000 mg | ORAL_TABLET | Freq: Three times a day (TID) | ORAL | Status: DC
  Administered 2022-02-01 – 2022-02-04 (×10): 10 mg via ORAL
  Filled 2022-02-01 (×13): qty 1

## 2022-02-01 NOTE — Progress Notes (Addendum)
   02/01/22 1318  Psych Admission Type (Psych Patients Only)  Admission Status Involuntary  Psychosocial Assessment  Patient Complaints Irritability;Other (Comment) (Patient complaints related to not wanting to take medications prescribed.)  Eye Contact Intense  Facial Expression Other (Comment)  Affect Appropriate to circumstance  Speech Logical/coherent  Interaction Assertive;Guarded;Seclusive  Motor Activity Pacing;Fidgety  Appearance/Hygiene Unremarkable  Behavior Characteristics Irritable  Mood Irritable  Thought Process  Coherency Circumstantial  Content Delusions  Delusions Other (Comment) (Pt states that taking medication will make him "sick" and states that his father is not his father.)  Perception WDL  Hallucination None reported or observed  Judgment Impaired  Confusion None  Danger to Self  Current suicidal ideation? Denies  Danger to Others  Danger to Others None reported or observed   Patient maintained on constant 1:1 supervision for safety. Patient denies SI/HI/AVH. Medication given as prescribed. Demonstrates irritable behavior/mood. Routine safety checks maintained every 15 minute. Patient refused to attend group, however attended recreational time with staff supervision and sitter.

## 2022-02-01 NOTE — Progress Notes (Addendum)
     1:1 Note: Patient remains on continuous supervision with MHT in doorway. Routine safety checks maintained. Adequate nourishment provided. Pt remains safe on the unit within his room. Regular respirations, no s/s of distress noted.

## 2022-02-01 NOTE — Progress Notes (Signed)
1:1 Note: Patient maintained on constant supervision for safety.  Medication given as prescribed.  Denies suicidal thoughts, auditory and visual hallucinations.  Presents with anxious mood and affect.  Routine safety checks maintained.  Refused to attend group when invited.  Patient is safe on the unit with supervision.

## 2022-02-01 NOTE — Group Note (Signed)
Recreation Therapy Group Note   Group Topic:Problem Solving  Group Date: 02/01/2022 Start Time: 1005 End Time: 1035 Facilitators: Hadrian Yarbrough-McCall, LRT,CTRS Location: [address] Dayroom   Goal Area(s) Addresses:  Patient will effectively work with peer towards shared goal.  Patient will identify skills used to make activity successful.  Patient will identify how skills used during activity can be used to reach post d/c goals.    Group Description:  [place]. Working together, patients were asked to use colored discs to get the entire team from one end of the hall way to the other. Patients were only allowed to move down and back the hallway by stepping on the discs, the team was provided 1 additional disc to assist with them completing task.  In addition, LRT would place cones in the path of the group forcing them to adjust their path.   Affect/Mood: N/A   Participation Level: Did not attend    Clinical Observations/Individualized Feedback:     Plan: Continue to engage patient in RT group sessions 2-3x/week.   Elane Peabody-McCall, LRT,CTRS 02/01/2022 12:43 PM

## 2022-02-01 NOTE — Group Note (Signed)
LCSW Group Therapy Note   Group Date: 02/01/2022 Start Time: 1100 End Time: 1200   Type of Therapy and Topic:  Group Therapy: Boundaries  Participation Level:  Did Not Attend  Description of Group: This group will address the use of boundaries in their personal lives. Patients will explore why boundaries are important, the difference between healthy and unhealthy boundaries, and negative and postive outcomes of different boundaries and will look at how boundaries can be crossed.  Patients will be encouraged to identify current boundaries in their own lives and identify what kind of boundary is being set. Facilitators will guide patients in utilizing problem-solving interventions to address and correct types boundaries being used and to address when no boundary is being used. Understanding and applying boundaries will be explored and addressed for obtaining and maintaining a balanced life. Patients will be encouraged to explore ways to assertively make their boundaries and needs known to significant others in their lives, using other group members and facilitator for role play, support, and feedback.  Therapeutic Goals:  1.  Patient will identify areas in their life where setting clear boundaries could be  used to improve their life.  2.  Patient will identify signs/triggers that a boundary is not being respected. 3.  Patient will identify two ways to set boundaries in order to achieve balance in  their lives: 4.  Patient will demonstrate ability to communicate their needs and set boundaries  through discussion and/or role plays  Summary of Patient Progress:  Did not attend  Therapeutic Modalities:   Cognitive Behavioral Therapy Solution-Focused Therapy  Westen Dinino E Jhovani Griswold, LCSW 02/01/2022  1:07 PM    

## 2022-02-01 NOTE — Progress Notes (Signed)
     02/01/22 2030  Psych Admission Type (Psych Patients Only)  Admission Status Involuntary  Psychosocial Assessment  Patient Complaints Anxiety;Irritability;Tension  Eye Contact Intense  Facial Expression Flat  Affect Irritable  Speech Logical/coherent  Interaction Assertive;Dominating;Guarded  Motor Activity Fidgety  Appearance/Hygiene Unremarkable  Behavior Characteristics Agitated;Irritable;Anxious  Mood Suspicious;Irritable  Thought Process  Coherency Circumstantial  Content WDL  Delusions None reported or observed  Perception WDL  Hallucination None reported or observed  Judgment Impaired  Confusion None  Danger to Self  Current suicidal ideation? Denies  Agreement Not to Harm Self Yes  Description of Agreement verbal  Danger to Others  Danger to Others None reported or observed

## 2022-02-01 NOTE — [facility] Group Notes (Signed)
  Spirituality group facilitated by Chaplain Katy Kotaro Buer, B[facility].   Group Description: Group focused on topic of strength. Patients participated in facilitated discussion around topic, connecting with one another around experiences and definitions for hope. Group members engaged with visual explorer photos, reflecting on what strength looks like for them today. Group engaged in discussion around how their definitions of hope are present today in hospital.   Modalities: Psycho-social ed, Adlerian, Narrative, MI   Patient Progress: Did not attend.  

## 2022-02-01 NOTE — [facility] Group Notes (Signed)
Patient did not attend goals group, despite encouragement.  

## 2022-02-01 NOTE — Progress Notes (Signed)
Patient presents with irritable mood and affect. Patient was offered dinner, to which he refused and stated "I'm going to show you what a problem is, who has a problem with me, a man or woman?" Patient had to be redirected several times. Routine safety checks maintained. Security called for reinforcement. Patient sitting on the bed with angry affect.

## 2022-02-01 NOTE — Plan of Care (Signed)
  Problem: Education: Goal: Knowledge of the prescribed therapeutic regimen will improve Outcome: Progressing   Problem: Coping: Goal: Will verbalize feelings Outcome: Progressing   Problem: Self-Care: Goal: Ability to participate in self-care as condition permits will improve Outcome: Progressing

## 2022-02-01 NOTE — Progress Notes (Addendum)
Pt is A&OX4, calm, denies suicidal ideations, denies homicidal ideations, denies auditory hallucinations and denies visual hallucinations. Pt verbally agrees to approach staff if these become apparent and before harming self or others. Pt denies experiencing nightmares. Mood and affect are congruent. Pt appetite is ok. No complaints of pain and/or discomfort at this time. Pt's memory appears to be grossly intact, and Pt hasn't displayed any injurious behaviors. Pt is medication compliant. There's no evidence of suicidal intent. Psychomotor activity was WNL. No s/s of Parkinson, Dystonia, Akathisia and/or Tardive Dyskinesia noted.  No aggressive behaviors this shift.
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
## 2022-02-02 DIAGNOSIS — F200 Paranoid schizophrenia: Secondary | ICD-10-CM | POA: Diagnosis not present

## 2022-02-02 NOTE — Group Note (Signed)
LCSW Group Therapy Note  02/02/2022      Type of Therapy and Topic:  Group Therapy: Gratitude  Participation Level:  Did Not Attend   Description of Group:   In this group, patients shared and discussed the importance of acknowledging the elements in their lives for which they are grateful and how this can positively impact their mood.  The group discussed how bringing the positive elements of their lives to the forefront of their minds can help with recovery from any illness, physical or mental.  An exercise was done as a group in which a list was made of gratitude items in order to encourage participants to consider other potential positives in their lives.  Therapeutic Goals: Patients will identify one or more item for which they are grateful in each of 6 categories:  people, experience, thing, place, skill, and other. Patients will discuss how it is possible to seek out gratitude in even bad situations. Patients will explore other possible items of gratitude that they could remember.   Summary of Patient Progress:  The patient did not attend this group.  Therapeutic Modalities:   Solution-Focused Therapy Activity  Porfirio Bollier, LCSWA 1:20 PM

## 2022-02-02 NOTE — Progress Notes (Signed)
  1:1 Note: Patient remains on continuous supervision with MHT in doorway. Routine safety checks maintained. Adequate nourishment provided. Pt remains safe on the unit within his room. Regular respirations, no s/s of distress noted.        

## 2022-02-02 NOTE — Progress Notes (Addendum)
    1:1  Note:  Pt remains under continual supervision for safety.  Pt is calm, sleeping in his bed  while laying in the supine position.  Pt's breathing is even and unlabored.  Pt is safe on the unit with q 15 minute safety checks and a safety sitter at his bedside.

## 2022-02-02 NOTE — Progress Notes (Signed)
Mouth check performed after administering oral medications. Pt returned to sleeping.

## 2022-02-02 NOTE — Progress Notes (Signed)
Adult Psychoeducational Group Note  Date:  02/02/2022 Time:  8:30 PM  Group Topic/Focus:  Wrap-Up Group:   The focus of this group is to help patients review their daily goal of treatment and discuss progress on daily workbooks.  Participation Level:  Did Not Attend  Participation Quality:  Did Not Attend  Affect:  Did Not Attend  Cognitive:  Did Not Attend  Insight: Did Not Attend  Engagement in Group:  Did Not Attend  Modes of Intervention:  Did Not Attend  Additional Comments:   Pt was encouraged to attend group discussion but refused.  Gerhard Perches 02/02/2022, 8:30 PM

## 2022-02-02 NOTE — Progress Notes (Signed)
   02/02/22 2005  Psych Admission Type (Psych Patients Only)  Admission Status Involuntary  Psychosocial Assessment  Patient Complaints Anxiety  Eye Contact Fair  Facial Expression Animated  Affect Appropriate to circumstance  Speech Logical/coherent  Interaction Assertive  Motor Activity Fidgety  Appearance/Hygiene Unremarkable  Behavior Characteristics Guarded  Mood Anxious;Pleasant  Thought Process  Coherency Circumstantial  Content WDL  Delusions None reported or observed  Perception WDL  Hallucination None reported or observed  Judgment Poor  Confusion None  Danger to Self  Current suicidal ideation? Denies  Agreement Not to Harm Self Yes (verbal contract)  Danger to Others  Danger to Others None reported or observed

## 2022-02-02 NOTE — Progress Notes (Signed)
Per physician order, Pt was allowed to go outside with his peers. No behaviors noted while outside per staff.

## 2022-02-02 NOTE — Progress Notes (Addendum)
   1:1 Note:  Pt remains under continual supervision for safety.  Pt observed exiting from the medication window displaying agitation.  Pt entered his room and is now laying on his bed.  Pt's breathing is even and unlabored.  Pt is safe on the unit with q 15 minute safety checks and a safety sitter at his bedside.

## 2022-02-02 NOTE — Progress Notes (Signed)
1:1 Note: Patient remains on continuous  supervision with MHT in doorway.  Routine safety checks maintained.  Pt remains safe on the unit within his room.  Regular respirations, no s/s of distress noted.
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
## 2022-02-02 NOTE — Progress Notes (Addendum)
   1:1 Note  Pt continues to remain on constant supervision for safety.  Pt is resting calmly in bed while laying on his back.  Pt is breathing evenly and shows no signs of distress.  Pt is safe on the unit; Q 15 minutes safety checks are being conducted; Air cabin crew remains with pt for close monitoring.

## 2022-02-02 NOTE — Progress Notes (Signed)
Pt is A&OX4, primarily cooperative, labile,  denies suicidal ideations, denies homicidal ideations, denies auditory hallucinations and denies visual hallucinations. Pt verbally agrees to approach staff if these become apparent and before harming self or others. Pt denies experiencing nightmares. Mood and affect are congruent. Pt appetite is ok. Pt initially refused his noon medication. Writer notified the mental health provider who stated, "Give him 15 minutes and retry, if he cont to refuse tomorrow, will do force meds." Writer waited an hour, Pt took medication. Writer checked to verify he was NOT cheeking his medication. No complaints of anxiety, distress, pain and/or discomfort at this time. Pt's memory appears to be grossly intact, and Pt hasn't displayed any injurious behaviors. There's no evidence of suicidal intent. Psychomotor activity was WNL. No s/s of Parkinson, Dystonia, Akathisia and/or Tardive Dyskinesia noted.

## 2022-02-03 DIAGNOSIS — F200 Paranoid schizophrenia: Secondary | ICD-10-CM | POA: Diagnosis not present

## 2022-02-03 NOTE — Group Note (Signed)
LCSW Group Therapy Note   Group Date: 09/12/2021 Start Time: 1100 End Time: 1200   Type of Therapy and Topic:  Group Therapy: Wise Mind  Participation Level:  Did not participate  Description of Group: Group members were presented with topic about wise mind, reasonable mind, and emotional mind.  Group members asked to identify situations were they used their wise mind, reasonable mind and emotional mind.  Members asked to identify how behaviors affected them after using parts of their mind and identified alternative behaviors they can use when they are in crisis.    Therapeutic Goals:  1. Patients will identify consequences of using emotional mind and rational mind.  2. Patients will engage in discussion on how they cope when they are in crisis 3.  Patients will discuss coping mechanisms to engage their wise mind     Summary of Patient Progress:    Did not participate  Therapeutic Modalities:  DBT Solution focused therapy    Jaila Schellhorn E Cormick Moss, LCSW

## 2022-02-03 NOTE — Progress Notes (Signed)
Pt in room most of the shift. Pt remains 1:1 with staff for safety. Pt initially refused all scheduled medications but after speaking with physician pt was medication compliant. Mouth was checked following administration to ensure ingestion. Pt has stated his goal is to no longer be on 1:1 status with staff. Pt reminded of behavioral expectations. Pt verbalized understanding.

## 2022-02-03 NOTE — Progress Notes (Signed)
Adult Psychoeducational Group Note  Date:  02/03/2022 Time:  8:29 PM  Group Topic/Focus:  Wrap-Up Group:   The focus of this group is to help patients review their daily goal of treatment and discuss progress on daily workbooks.  Participation Level:  Did Not Attend  Participation Quality:  Did Not Attend  Affect:  Did Not Attend  Cognitive:  Did Not Attend  Insight: Did Not Attend  Engagement in Group:  Did Not Attend  Modes of Intervention:  Did Not Attend  Additional Comments:   Pt was encouraged to attend group discussion but refused   Gerhard Perches 02/03/2022, 8:29 PM

## 2022-02-03 NOTE — Progress Notes (Signed)
1:1 Nursing note - Patient currently asleep no distress noted. Safety maintained and 1:1 continues with sitter at bedside.

## 2022-02-03 NOTE — Progress Notes (Signed)
  1:1 Nursing Note ( late entry) Patient was up in his room at shift change talking to his sitter. He was compliant with his medications tonight. Writer asked if he would like to go to the dayroom and he reported that he doesn't like the dayroom. Support given and safety maintained with sitter at bedside. He is currently asleep with no distress noted.

## 2022-02-03 NOTE — Progress Notes (Signed)
Adult Psychoeducational Group Note  Date:  02/03/2022 Time:  12:02 PM  Pt did not attend goals group.  Sandi Mariscal O 02/03/2022, 12:02 PM
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
## 2022-02-03 NOTE — Progress Notes (Signed)
Patient lying in bed resting. He reports that he went outside today and between the medicine and going outside has made him really tired. He denies si/hi/auditory and visual hallucinations. Patient remains safe and 1:1 continues.

## 2022-02-03 NOTE — Progress Notes (Signed)
1:1 Note- Patient lying in bed asleep no distress noted. Safety maintained with sitter at bedside.

## 2022-02-04 ENCOUNTER — Encounter (HOSPITAL_COMMUNITY): Payer: Self-pay

## 2022-02-04 DIAGNOSIS — F200 Paranoid schizophrenia: Secondary | ICD-10-CM | POA: Diagnosis not present

## 2022-02-04 MED ORDER — FLUPHENAZINE DECANOATE 25 MG/ML IJ SOLN
25.0000 mg | INTRAMUSCULAR | Status: DC
  Administered 2022-02-04: 25 mg via INTRAMUSCULAR
  Filled 2022-02-04: qty 1

## 2022-02-04 MED ORDER — FLUPHENAZINE HCL 10 MG PO TABS
10.0000 mg | ORAL_TABLET | Freq: Two times a day (BID) | ORAL | Status: DC
  Filled 2022-02-04 (×2): qty 1

## 2022-02-04 MED ORDER — FLUPHENAZINE HCL 5 MG PO TABS
10.0000 mg | ORAL_TABLET | Freq: Two times a day (BID) | ORAL | Status: DC
  Administered 2022-02-04 – 2022-02-08 (×8): 10 mg via ORAL
  Filled 2022-02-04: qty 2
  Filled 2022-02-04 (×4): qty 1
  Filled 2022-02-04: qty 20
  Filled 2022-02-04: qty 1
  Filled 2022-02-04: qty 2
  Filled 2022-02-04: qty 20
  Filled 2022-02-04 (×4): qty 1
  Filled 2022-02-04 (×2): qty 20
  Filled 2022-02-04: qty 1

## 2022-02-04 NOTE — Progress Notes (Signed)
(  Late entry, 1:1 note ) Patient is asleep with eyes closed and no distress  noted. Respirations even and unlabored, 1:1 continues for patient safety.

## 2022-02-04 NOTE — Progress Notes (Signed)
[facility] 1:1 Observation Documentation  For the first (8) hours following discontinuation of 1:1 precautions, a progress note entry by nursing staff should be documented at least every 2 hours, reflecting the patient's behavior, condition, mood, and conversation.  Use the progress notes for additional entries.  Time 1:1 discontinued:  1027  Patient's Behavior:  In the hallway  Patient's Condition:  Alert, calm  Patient's Conversation:  Patient asking if it's time to take medication. Patient asked to go to the cafeteria. Patient stayed on the unit for lunch.   Serita Grammes 02/04/2022, 2:02 PM

## 2022-02-04 NOTE — Progress Notes (Signed)
[facility] 1:1 Observation Documentation  For the first (8) hours following discontinuation of 1:1 precautions, a progress note entry by nursing staff should be documented at least every 2 hours, reflecting the patient's behavior, condition, mood, and conversation.  Use the progress notes for additional entries.  Time 1:1 discontinued:  1027  Patient's Behavior:  In his room  Patient's Condition:  Alert, Calm,cooperative  Patient's Conversation:  Patient in room. Pleasant and cooperative. Says he is waiting for dinner.   Serita Grammes 02/04/2022, 4:59 PM

## 2022-02-04 NOTE — Progress Notes (Signed)
[facility] 1:1 Observation Documentation  For the first (8) hours following discontinuation of 1:1 precautions, a progress note entry by nursing staff should be documented at least every 2 hours, reflecting the patient's behavior, condition, mood, and conversation.  Use the progress notes for additional entries.  Time 1:1 discontinued:  1027  Patient's Behavior:  In the hallway  Patient's Condition:  Alert, Calm,cooperative  Patient's Conversation:  Asking what time will they go outside. Staff notified patient 3pm. Patient stated ok.   Serita Grammes 02/04/2022, 3:09 PM

## 2022-02-04 NOTE — Progress Notes (Signed)
[facility] 1:1 Observation Documentation  For the first (8) hours following discontinuation of 1:1 precautions, a progress note entry by nursing staff should be documented at least every 2 hours, reflecting the patient's behavior, condition, mood, and conversation.  Use the progress notes for additional entries.  Time 1:1 discontinued:  1027  Patient's Behavior:  In his room in bed  Patient's Condition:  Calm, cooperative resting with eyes closed  Patient's Conversation:  Patient is pleasant when approached by staff. Says he is just resting after dinner. Denies pain, denies SI/HI.   Serita Grammes 02/04/2022, 6:56 PM

## 2022-02-04 NOTE — [facility] Counselor (Signed)
[facility] LCSW Progress Note   02/04/2022    3:31 PM  CHIRAG KRUEGER   [id number]   Type of Contact and Topic:  IVC  CSW spoke with patient regarding IVC hearing.  Patient interested in participating in court to request discharge.  Patient provided opportunity to speak to public defender, Pilar Plate.  Patient able to ask questions and clarification about court and ability to advocate for discharge.     Signed:  Riki Altes, MSW, LCSW, LCAS 02/04/2022 3:31 PM

## 2022-02-04 NOTE — Progress Notes (Signed)
This morning patient appeared paranoid, looking over his shoulder, body tense. However, in treatment team meeting, provider decided to try patient off 1:1. Provider spoke with patient and decided to discontinue 1:1 due to patient having no documented incidents in the last 48 hours. Providers notified patient that he would need to attend group, meal in the cafeteria, perform personal hygiene and allow labs to be drawn.  Patient agreed. Patient took his morning medications. Denies SI/HI.Reports that he slept well last night.
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
## 2022-02-04 NOTE — BH IP Treatment Plan (Addendum)
Interdisciplinary Treatment and Diagnostic Plan Update  02/04/2022 Time of Session: 0830 Brendan Hogan MRN: [id number]  Principal Diagnosis: Schizophrenia [facility])  Secondary Diagnoses: Principal Problem:   Schizophrenia (HCC)   Current Medications:  Current Facility-Administered Medications  Medication Dose Route Frequency Provider Last Rate Last Admin   acetaminophen (TYLENOL) tablet 650 mg  650 mg Oral Q6H PRN Nira Conn A, NP   650 mg at 02/03/22 1951   alum & mag hydroxide-simeth (MAALOX/MYLANTA) 200-200-20 MG/5ML suspension 30 mL  30 mL Oral Q4H PRN Nira Conn A, NP       benztropine (COGENTIN) tablet 1 mg  1 mg Oral BID Abbott Pao, Nadir, MD   1 mg at 02/04/22 0820   cloNIDine (CATAPRES) tablet 0.1 mg  0.1 mg Oral BID Park Pope, MD   0.1 mg at 02/04/22 0820   diphenhydrAMINE (BENADRYL) injection 50 mg  50 mg Intramuscular Q6H PRN Mason Jim, Amy E, MD       divalproex (DEPAKOTE) DR tablet 750 mg  750 mg Oral Q12H Paliy, Serina Cowper, MD   750 mg at 02/04/22 1962   docusate sodium (COLACE) capsule 100 mg  100 mg Oral Daily PRN Comer Locket, MD       fluPHENAZine (PROLIXIN) tablet 10 mg  10 mg Oral Q12H Lamar Sprinkles, MD       fluPHENAZine decanoate (PROLIXIN) injection 25 mg  25 mg Intramuscular Q21 days Lamar Sprinkles, MD   25 mg at 02/04/22 1355   hydrOXYzine (ATARAX) tablet 25 mg  25 mg Oral TID PRN Nwoko, Uchenna E, PA   25 mg at 02/01/22 2031   LORazepam (ATIVAN) tablet 1 mg  1 mg Oral Q6H PRN Comer Locket, MD       Or   LORazepam (ATIVAN) injection 2 mg  2 mg Intramuscular Q6H PRN Comer Locket, MD       LORazepam (ATIVAN) tablet 1 mg  1 mg Oral BID Mason Jim, Amy E, MD   1 mg at 02/04/22 2297   magnesium hydroxide (MILK OF MAGNESIA) suspension 30 mL  30 mL Oral Daily PRN Jackelyn Poling, NP       nicotine polacrilex (NICORETTE) gum 2 mg  2 mg Oral PRN Nira Conn A, NP   2 mg at 02/04/22 1217   OLANZapine (ZYPREXA) injection 5 mg  5 mg Intramuscular Q6H PRN  Comer Locket, MD       Or   OLANZapine zydis (ZYPREXA) disintegrating tablet 10 mg  10 mg Oral Q6H PRN Comer Locket, MD       zolpidem (AMBIEN) tablet 10 mg  10 mg Oral QHS PRN Massengill, Harrold Donath, MD   10 mg at 02/03/22 2101   PTA Medications: Medications Prior to Admission  Medication Sig Dispense Refill Last Dose   ARIPiprazole (ABILIFY) 20 MG tablet Take 1 tablet (20 mg total) by mouth daily. (Patient not taking: Reported on 01/08/2022) 30 tablet 1    mirtazapine (REMERON) 15 MG tablet Take 1 tablet (15 mg total) by mouth at bedtime. (Patient not taking: Reported on 01/08/2022) 30 tablet 1    traZODone (DESYREL) 50 MG tablet Take 50 mg by mouth at bedtime. (Patient not taking: Reported on 01/08/2022)      zolpidem (AMBIEN) 10 MG tablet Take 1 tablet (10 mg total) by mouth at bedtime. (Patient not taking: Reported on 01/08/2022) 2 tablet 0     Patient Stressors: Other: Patient identifies no stressors. He states he has been off of his medications  and "it was all blown out of proportion."    Patient Strengths: Ability for insight  Supportive family/friends   Treatment Modalities: Medication Management, Group therapy, Case management,  1 to 1 session with clinician, Psychoeducation, Recreational therapy.   Physician Treatment Plan for Primary Diagnosis: Schizophrenia (HCC) Long Term Goal(s): Improvement in symptoms so as ready for discharge   Short Term Goals: Ability to identify changes in lifestyle to reduce recurrence of condition will improve Ability to verbalize feelings will improve Ability to disclose and discuss suicidal ideas Ability to demonstrate self-control will improve Ability to identify and develop effective coping behaviors will improve  Medication Management: Evaluate patient's response, side effects, and tolerance of medication regimen.  Therapeutic Interventions: 1 to 1 sessions, Unit Group sessions and Medication administration.  Evaluation of Outcomes:  Progressing  Physician Treatment Plan for Secondary Diagnosis: Principal Problem:   Schizophrenia (HCC)  Long Term Goal(s): Improvement in symptoms so as ready for discharge   Short Term Goals: Ability to identify changes in lifestyle to reduce recurrence of condition will improve Ability to verbalize feelings will improve Ability to disclose and discuss suicidal ideas Ability to demonstrate self-control will improve Ability to identify and develop effective coping behaviors will improve     Medication Management: Evaluate patient's response, side effects, and tolerance of medication regimen.  Therapeutic Interventions: 1 to 1 sessions, Unit Group sessions and Medication administration.  Evaluation of Outcomes: Progressing   RN Treatment Plan for Primary Diagnosis: Schizophrenia (HCC) Long Term Goal(s): Knowledge of disease and therapeutic regimen to maintain health will improve  Short Term Goals: Ability to remain free from injury will improve, Ability to verbalize frustration and anger appropriately will improve, Ability to demonstrate self-control, Ability to participate in decision making will improve, Ability to verbalize feelings will improve, Ability to disclose and discuss suicidal ideas, and Compliance with prescribed medications will improve  Medication Management: RN will administer medications as ordered by provider, will assess and evaluate patient's response and provide education to patient for prescribed medication. RN will report any adverse and/or side effects to prescribing provider.  Therapeutic Interventions: 1 on 1 counseling sessions, Psychoeducation, Medication administration, Evaluate responses to treatment, Monitor vital signs and CBGs as ordered, Perform/monitor CIWA, COWS, AIMS and Fall Risk screenings as ordered, Perform wound care treatments as ordered.  Evaluation of Outcomes: Progressing   LCSW Treatment Plan for Primary Diagnosis: Schizophrenia  (HCC) Long Term Goal(s): Safe transition to appropriate next level of care at discharge, Engage patient in therapeutic group addressing interpersonal concerns.  Short Term Goals: Engage patient in aftercare planning with referrals and resources, Increase social support, Increase ability to appropriately verbalize feelings, Increase emotional regulation, Facilitate acceptance of mental health diagnosis and concerns, Facilitate patient progression through stages of change regarding substance use diagnoses and concerns, and Identify triggers associated with mental health/substance abuse issues  Therapeutic Interventions: Assess for all discharge needs, 1 to 1 time with Social worker, Explore available resources and support systems, Assess for adequacy in community support network, Educate family and significant other(s) on suicide prevention, Complete Psychosocial Assessment, Interpersonal group therapy.  Evaluation of Outcomes: Progressing   Progress in Treatment: Attending groups: No. Participating in groups: No. Taking medication as prescribed: Yes. Toleration medication: Yes. Family/Significant other contact made: Yes, individual(s) contacted:  Kale Rondeau, (father/guardian), [phone]  Patient understands diagnosis: No. Discussing patient identified problems/goals with staff: Yes. Medical problems stabilized or resolved: Yes. Denies suicidal/homicidal ideation: Yes. Issues/concerns per patient self-inventory: Yes. Other: none  New problem(s) identified:  No, Describe:  none  New Short Term/Long Term Goal(s): Patient to work towards detox, elimination of symptoms of psychosis, medication management for mood stabilization; development of comprehensive mental wellness/sobriety plan.  Patient Goals: No additional goals identified at this time. Patient to continue to work towards original goals identified in initial treatment team meeting. CSW will remain available to patient should they  voice additional treatment goals.   Discharge Plan or Barriers: Patient may not be able to return to father's residence due to safety concerns. CSW team will continue to pursue placement options and discuss with father/guardian.    Reason for Continuation of Hospitalization: Other; describe psychosis   Estimated Length of Stay: 1-7 days   Last 3 [place] Suicide Severity Risk Score: [place] Admission (Current) from 01/20/2022 in [place] [facility] ED from 01/08/2022 in [place] DEPT ED from 12/26/2021 in [place] No Risk Moderate Risk High Risk       Last PHQ 2/9 Scores:    07/19/2020    3:12 PM 05/29/2020    7:35 AM  Depression screen PHQ 2/9  Decreased Interest 1 0  Down, Depressed, Hopeless 1 1  PHQ - 2 Score 2 1  Altered sleeping 2   Tired, decreased energy 1   Change in appetite 1   Feeling bad or failure about yourself  1   Trouble concentrating 1   Moving slowly or fidgety/restless 0   Suicidal thoughts 0   PHQ-9 Score 8     Scribe for Treatment Team: Larose Kells 02/04/2022 3:47 PM

## 2022-02-04 NOTE — Progress Notes (Signed)
Adult Psychoeducational Group Note  Date:  02/04/2022 Time:  8:23 PM  Group Topic/Focus:  Wrap-Up Group:   The focus of this group is to help patients review their daily goal of treatment and discuss progress on daily workbooks.  Participation Level:  Did Not Attend  Participation Quality:  Did Not Attend  Affect:  Did Not Attend  Cognitive:  Did Not Attend  Insight: Did Not Attend  Engagement in Group:  Did Not Attend  Modes of Intervention:  Did Not Attend  Additional Comments:  Pt was encouraged to attend group discussion but refused.   Gerhard Perches 02/04/2022, 8:23 PM

## 2022-02-04 NOTE — Progress Notes (Signed)
1:1 note: Patient in bed asleep. Respirations even and unlabored. No distress noted. Patient is safe, 1:1 continued with sitter at bedside.

## 2022-02-04 NOTE — Progress Notes (Signed)
[facility] 1:1 Observation Documentation  For the first (8) hours following discontinuation of 1:1 precautions, a progress note entry by nursing staff should be documented at least every 2 hours, reflecting the patient's behavior, condition, mood, and conversation.  Use the progress notes for additional entries.  Time 1:1 discontinued:  1027  Patient's Behavior:  In his room  Patient's Condition: Alert, calm  Patient's Conversation:  Came out of room asking when next medications were due. Re-directed patient to go to group. Patient walked back to his room.   Serita Grammes 02/04/2022, 11:17 AM

## 2022-02-04 NOTE — Plan of Care (Signed)
?  Problem: Education: ?Goal: Knowledge of the prescribed therapeutic regimen will improve ?Outcome: Progressing ?  ?Problem: Coping: ?Goal: Coping ability will improve ?Outcome: Progressing ?Goal: Will verbalize feelings ?Outcome: Progressing ?  ?Problem: Health Behavior/Discharge Planning: ?Goal: Compliance with prescribed medication regimen will improve ?Outcome: Progressing ?  ?

## 2022-02-04 NOTE — Progress Notes (Signed)
     02/04/22 2040  Psych Admission Type (Psych Patients Only)  Admission Status Involuntary  Psychosocial Assessment  Patient Complaints Irritability;Anxiety  Eye Contact Fair  Facial Expression Animated  Affect Appropriate to circumstance  Speech Logical/coherent  Interaction Assertive;Cautious;Demanding;Guarded  Motor Activity Fidgety  Appearance/Hygiene Unremarkable  Behavior Characteristics Agitated;Guarded  Mood Anxious  Thought Process  Coherency Circumstantial  Content WDL  Delusions None reported or observed  Perception WDL  Hallucination None reported or observed  Judgment Poor  Confusion None  Danger to Self  Current suicidal ideation? Denies  Self-Injurious Behavior No self-injurious ideation or behavior indicators observed or expressed   Agreement Not to Harm Self Yes  Description of Agreement verbal  Danger to Others  Danger to Others None reported or observed

## 2022-02-04 NOTE — Progress Notes (Addendum)
1:1 note- Patient is lying in bed asleep with eyes closed and respirations even and unlabored. Patient is safe and 1:1 continues with sitter at bedside.

## 2022-02-04 NOTE — Group Note (Signed)
Recreation Therapy Group Note   Group Topic:Goal Setting  Group Date: 02/04/2022 Start Time: 1000 End Time: 1050 Facilitators: Donte Kary-McCall, LRT,CTRS Location: [address] Dayroom   Goal Area(s) Addresses:  Patient will participate in discussion of what a goal is. Patient will successfully identify goals they want to set for various points in time.  Group Description: Group started with a discussion about what goals were. Patients were asked what their definition of a goal was.  Patients were given a sheet that broken down goal setting into different times (week, month, year, 5 years).  Patients were to then identify any obstacles that would impede their process to completing their goals, what they needed in order to be successful in completing those goals and what they could start doing now to work towards those goals.   Affect/Mood: N/A   Participation Level: Did not attend    Clinical Observations/Individualized Feedback:     Plan: Continue to engage patient in RT group sessions 2-3x/week.   Sebastion Jun-McCall, LRT,CTRS 02/04/2022 12:56 PM

## 2022-02-04 NOTE — Progress Notes (Signed)
   02/04/22 1000  Psych Admission Type (Psych Patients Only)  Admission Status Involuntary  Psychosocial Assessment  Patient Complaints Anxiety;Irritability  Eye Contact Fair  Facial Expression Animated  Affect Appropriate to circumstance  Speech Logical/coherent  Interaction Assertive;Cautious;Guarded  Motor Activity Fidgety  Appearance/Hygiene Unremarkable  Behavior Characteristics Guarded;Agitated  Mood Anxious  Thought Process  Coherency Circumstantial  Content WDL  Delusions None reported or observed  Perception WDL  Hallucination None reported or observed  Judgment Poor  Confusion None  Danger to Self  Current suicidal ideation? Denies  Self-Injurious Behavior No self-injurious ideation or behavior indicators observed or expressed   Agreement Not to Harm Self Yes  Description of Agreement verbal  Danger to Others  Danger to Others None reported or observed

## 2022-02-04 NOTE — Group Note (Signed)
LCSW Group Therapy  Type of Therapy and Topic:  Group Therapy: Thoughts, Feelings, and Actions  Participation Level:  Did Not Attend   Description of Group:   In this group, each patient discussed their previous experiencing and understanding of overthinking, identifying the harmful impact on their lives. As a group, each patient was introduced to the basic concepts of Cognitive Behavioral Therapy: that thoughts, feelings, and actions are all connected and influence one another. They were given examples of how overthinking can affect our feelings, actions, and vise versa. The group was then asked to analyze how overthinking was harmful and brainstorm alternative thinking patterns/reactions to the example situation. Then, each group member filled out and identified their own example situation in which a problem situation caused their thoughts, feelings, and actions to be negatively impacted; they were asked to come up with 3 new (more adaptive/positive) thoughts that led to 3 new feelings and actions.  Therapeutic Goals: Patients will review and discuss their past experience with overthinking. Patients will learn the basics of the CBT model through group-led examples.. Patients will identify situations where they may have negative thoughts, feelings, or actions and will then reframe the situation using more positive thoughts to react differently.  Summary of Patient Progress: Did not attend  Therapeutic Modalities:   Cognitive Crocker, LCSW 02/04/2022  12:11 PM

## 2022-02-05 DIAGNOSIS — F209 Schizophrenia, unspecified: Secondary | ICD-10-CM | POA: Diagnosis not present

## 2022-02-05 LAB — CBC WITH DIFFERENTIAL/PLATELET
Abs Immature Granulocytes: 0.02 10*3/uL (ref 0.00–0.07)
Basophils Absolute: 0 10*3/uL (ref 0.0–0.1)
Basophils Relative: 1 %
Eosinophils Absolute: 0.1 10*3/uL (ref 0.0–0.5)
Eosinophils Relative: 3 %
HCT: 42.3 % (ref 39.0–52.0)
Hemoglobin: 13.7 g/dL (ref 13.0–17.0)
Immature Granulocytes: 1 %
Lymphocytes Relative: 49 %
Lymphs Abs: 2 10*3/uL (ref 0.7–4.0)
MCH: 29.1 pg (ref 26.0–34.0)
MCHC: 32.4 g/dL (ref 30.0–36.0)
MCV: 90 fL (ref 80.0–100.0)
Monocytes Absolute: 0.4 10*3/uL (ref 0.1–1.0)
Monocytes Relative: 9 %
Neutro Abs: 1.4 10*3/uL — ABNORMAL LOW (ref 1.7–7.7)
Neutrophils Relative %: 37 %
Platelets: 182 10*3/uL (ref 150–400)
RBC: 4.7 MIL/uL (ref 4.22–5.81)
RDW: 12.8 % (ref 11.5–15.5)
WBC: 3.9 10*3/uL — ABNORMAL LOW (ref 4.0–10.5)
nRBC: 0 % (ref 0.0–0.2)

## 2022-02-05 LAB — HEPATIC FUNCTION PANEL
ALT: 12 U/L (ref 0–44)
AST: 15 U/L (ref 15–41)
Albumin: 3.6 g/dL (ref 3.5–5.0)
Alkaline Phosphatase: 82 U/L (ref 38–126)
Bilirubin, Direct: <0.1 mg/dL (ref 0.0–0.2)
Total Bilirubin: 0.5 mg/dL (ref 0.3–1.2)
Total Protein: 6.2 g/dL — ABNORMAL LOW (ref 6.5–8.1)

## 2022-02-05 LAB — VALPROIC ACID LEVEL: Valproic Acid Lvl: 53 ug/mL (ref 50.0–100.0)

## 2022-02-05 LAB — AMMONIA: Ammonia: 54 umol/L — ABNORMAL HIGH (ref 9–35)
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
## 2022-02-05 NOTE — Progress Notes (Addendum)
  On assessment, pt is observed in hallway and in dayroom interacting with peers.  Pt is alert, oriented, and much calmer today. 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  Pt denies SI, HI, and AVH.  Pt contracts for safety.  Administered scheduled medications along with PRN Zolpidem and Nicorette gum per MAR per pt request.  Hydrated patient.  Pt is presently safe on the unit with Q 15 minute safety checks.    02/05/22 2028  Psych Admission Type (Psych Patients Only)  Admission Status Involuntary  Psychosocial Assessment  Patient Complaints None  Eye Contact Fair  Facial Expression Animated  Affect Appropriate to circumstance  Speech Logical/coherent  Interaction Assertive;Cautious  Motor Activity Fidgety  Appearance/Hygiene Unremarkable  Behavior Characteristics Cooperative;Appropriate to situation  Mood Pleasant  Thought Process  Coherency Circumstantial  Content WDL  Delusions None reported or observed  Perception WDL  Hallucination None reported or observed  Judgment Poor  Confusion None  Danger to Self  Current suicidal ideation? Denies  Self-Injurious Behavior No self-injurious ideation or behavior indicators observed or expressed   Agreement Not to Harm Self Yes  Description of Agreement verbal  Danger to Others  Danger to Others None reported or observed

## 2022-02-05 NOTE — Progress Notes (Signed)
Adult Psychoeducational Group Note  Date:  02/05/2022 Time:  8:34 PM  Group Topic/Focus:  Wrap-Up Group:   The focus of this group is to help patients review their daily goal of treatment and discuss progress on daily workbooks.  Participation Level:  Did Not Attend  Participation Quality:   Did Not Attend  Affect:   Did Not Attend  Cognitive:   Did Not Attend  Insight: None  Engagement in Group:   Did Not Attend  Modes of Intervention:   Did Not Attend  Additional Comments:  Pt was encouraged to attend wrap up group but did not attend.  Candy Sledge 02/05/2022, 8:34 PM

## 2022-02-05 NOTE — Progress Notes (Signed)
     02/05/22 0602  Sleep  Number of Hours 7.25

## 2022-02-05 NOTE — [facility] Counselor (Signed)
[facility] LCSW Progress Note   02/05/2022    2:48 PM  Brendan Hogan   [id number]   Type of Contact and Topic: IVC court hearing  Patient participated in court hearing and advocated for his discharge.  Marveen Reeks ordered that patient would need to remain IVCed and that they would reconvene with his case in 2 weeks. Patient became visibly frustrated and demanded to be let go and discharged.  CSW spoke to patient after hearing and provided clarity about what his discharge plans would look like and helped clarify what he needed to do to be discharged.  Patient demanded to be seen by the doctor.      Signed:  Riki Altes, MSW, LCSW, LCAS 02/05/2022 2:48 PM

## 2022-02-05 NOTE — Group Note (Signed)
Recreation Therapy Group Note   Group Topic:Health and Wellness  Group Date: 02/05/2022 Start Time: 1000 End Time: 1030 Facilitators: Niaja Stickley-McCall, LRT,CTRS Location: [address] Dayroom   Goal Area(s) Addresses:  Patient will verbalize benefit of exercise during group session. Patient will identify an exercise that can be completed post d/c. Patient will acknowledge benefits of exercise when used as a coping mechanism.   Group Description:  Exercise.  LRT and patients went over the importance of physical health and how it impacts overall wellbeing.  LRT and patients went through a series of stretches to loosen the muscles.  Patients then took turns leading the group in any exercise of their choosing.  LRT expressed to patients to take breaks or get water as needed.  LRT and patients were going for at least 30 minutes of exercise.   Affect/Mood: Appropriate   Participation Level: Engaged   Participation Quality: Independent   Behavior: Appropriate   Speech/Thought Process: Focused   Insight: Good   Judgement: Good   Modes of Intervention: Music   Patient Response to Interventions:  Engaged   Education Outcome:  Acknowledges education and In group clarification offered    Clinical Observations/Individualized Feedback: Pt was engaged during group session.  Pt interacted well and socialized with peers.  Pt was bright and upbeat throughout group.  Pt led group in high knees, jumping jacks and quad stretches.  Pt also took the time to learn how to do exercises he hadn't done without getting frustrated and sticking with it to get it right.    Plan: Continue to engage patient in RT group sessions 2-3x/week.   Aliviya Schoeller-McCall, LRT,CTRS 02/05/2022 12:47 PM

## 2022-02-05 NOTE — Progress Notes (Signed)
Patient attending group this morning. Calm and cooperative. Pleasant with staff and others. Denies SI/HI. Patient reports he slept well last night. No distress noted at this time.

## 2022-02-05 NOTE — Progress Notes (Signed)
   02/05/22 1000  Psych Admission Type (Psych Patients Only)  Admission Status Involuntary  Psychosocial Assessment  Patient Complaints None  Eye Contact Fair  Facial Expression Animated  Affect Appropriate to circumstance  Speech Logical/coherent  Interaction Assertive;Cautious  Motor Activity Fidgety  Appearance/Hygiene Unremarkable  Behavior Characteristics Cooperative;Appropriate to situation  Mood Pleasant  Thought Process  Coherency Circumstantial  Content WDL  Delusions None reported or observed  Perception WDL  Hallucination None reported or observed  Judgment Poor  Confusion None  Danger to Self  Current suicidal ideation? Denies  Self-Injurious Behavior No self-injurious ideation or behavior indicators observed or expressed   Agreement Not to Harm Self Yes  Description of Agreement Verbal  Danger to Others  Danger to Others None reported or observed

## 2022-02-05 NOTE — Plan of Care (Signed)
  Problem: Education: Goal: Will be free of psychotic symptoms Outcome: Progressing Goal: Knowledge of the prescribed therapeutic regimen will improve Outcome: Progressing   Problem: Coping: Goal: Coping ability will improve Outcome: Progressing Goal: Will verbalize feelings Outcome: Progressing   Problem: Health Behavior/Discharge Planning: Goal: Compliance with prescribed medication regimen will improve Outcome: Progressing

## 2022-02-06 DIAGNOSIS — F209 Schizophrenia, unspecified: Secondary | ICD-10-CM | POA: Diagnosis not present

## 2022-02-06 LAB — CBC WITH DIFFERENTIAL/PLATELET
Abs Immature Granulocytes: 0.03 10*3/uL (ref 0.00–0.07)
Basophils Absolute: 0.1 10*3/uL (ref 0.0–0.1)
Basophils Relative: 1 %
Eosinophils Absolute: 0.1 10*3/uL (ref 0.0–0.5)
Eosinophils Relative: 1 %
HCT: 44.4 % (ref 39.0–52.0)
Hemoglobin: 14.5 g/dL (ref 13.0–17.0)
Immature Granulocytes: 0 %
Lymphocytes Relative: 20 %
Lymphs Abs: 1.6 10*3/uL (ref 0.7–4.0)
MCH: 28.8 pg (ref 26.0–34.0)
MCHC: 32.7 g/dL (ref 30.0–36.0)
MCV: 88.3 fL (ref 80.0–100.0)
Monocytes Absolute: 0.6 10*3/uL (ref 0.1–1.0)
Monocytes Relative: 8 %
Neutro Abs: 5.5 10*3/uL (ref 1.7–7.7)
Neutrophils Relative %: 70 %
Platelets: 208 10*3/uL (ref 150–400)
RBC: 5.03 MIL/uL (ref 4.22–5.81)
RDW: 12.7 % (ref 11.5–15.5)
WBC: 7.8 10*3/uL (ref 4.0–10.5)
nRBC: 0 % (ref 0.0–0.2)

## 2022-02-06 MED ORDER — LORAZEPAM 0.5 MG PO TABS
0.5000 mg | ORAL_TABLET | Freq: Two times a day (BID) | ORAL | Status: DC
  Administered 2022-02-07 – 2022-02-08 (×3): 0.5 mg via ORAL
  Filled 2022-02-06 (×3): qty 1

## 2022-02-06 NOTE — Progress Notes (Signed)
[facility] Group Notes:  (Nursing/MHT/Case Management/Adjunct)  Date:  02/06/2022  Time:  2000  Type of Therapy:   wrap up group  Participation Level:  Active  Participation Quality:  Appropriate, Attentive, and Sharing  Affect:  Flat  Cognitive:  Alert  Insight:  Improving  Engagement in Group:  Engaged  Modes of Intervention:  Clarification, Education, and Support  Summary of Progress/Problems: Positive thinking and positive change were discussed. Pt shared he woke up with peace this morning, plans to be more respectful to family, and is grateful he is able to use his voice/have a voice.   Brendan Hogan S 02/06/2022, 8:21 PM
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
## 2022-02-06 NOTE — Progress Notes (Signed)
Pt visible in milieu at intervals during shift. Presents with congruent affect, logical speech, fair eye contact and is ambulatory with steady gait. Denies SI, HI, AVH and pain when assessed. Attended scheduled groups, engaged in off unit activity to courtyard and returned without issues. Pt remains medication compliant with mouth checks, denies adverse drug reactions. Expressed excitement about "My father is coming tonight. I can't wait to see him. I can't wait to go home" which is an improvement from previous visits. Support, encouragement and reassurance provided to pt. Q 15 minutes safety checks maintained without outburst. Verbal education don on current regimen. Labs done with increased verbal prompts but pt was cooperative "I'm ready to leave". Tolerates meals and fluids well. Interacts well with peers and staff. Pt verbalized understanding related to care & unit routines. Denies concerns at this time.

## 2022-02-06 NOTE — Progress Notes (Signed)
     02/06/22 0647  Sleep  Number of Hours 7.25

## 2022-02-06 NOTE — Group Note (Signed)
Recreation Therapy Group Note   Group Topic:Leisure Education  Group Date: 02/06/2022 Start Time: 1000 End Time: 1040 Facilitators: Atalya Dano-McCall, LRT,CTRS Location: [address] Dayroom   Goal Area(s) Addresses:  Patient will successfully identify positive leisure and recreation activities.  Patient will acknowledge benefits of participation in healthy leisure activities post discharge.  Patient will actively work with peers toward a shared goal.   Group Description: Pictionary. In groups of 5-7, patients took turns trying to guess the picture being drawn on the board by their teammate.  If the team guessed the correct answer, they won a point.  If the team guessed wrong, the other team got a chance to steal the point. After several rounds of game play, the team with the most points were declared winners. Post-activity discussion reviewed benefits of positive recreation outlets: reducing stress, improving coping mechanisms, increasing self-esteem, and building larger support systems.   Affect/Mood: N/A   Participation Level: Did not attend     Clinical Observations/Individualized Feedback:      Plan: Continue to engage patient in RT group sessions 2-3x/week.   Hussien Greenblatt-McCall, LRT,CTRS 02/06/2022 11:02 AM

## 2022-02-06 NOTE — Group Note (Signed)
[facility] LCSW Group Therapy Note  Date/Time: 02/06/2022 @ 1pm Type of Therapy and Topic:  Group Therapy:  Strengths and Qualities  Participation Level:  Active  Description of Group:    In this group patients will be asked to explore and define the terms strength ans qualities.  Patients will be guided to discuss their thoughts, feelings, and behaviors as to where strengths and qualities originate. Participants will then list some of their strengths and qualities related to each subject topic. This group will be process-oriented, with patients participating in exploration of their own experiences as well as giving and receiving support and challenge from other group members.  Therapeutic Goals: Patient will identify specific strengths related to their personal life. Patient will identify feelings, thoughts, and beliefs about strengths and qualities. Patient will identify ways their strengths have been used. . Patient will identify situations where they have helped others or made someone else happy. .  Summary of Patient Progress Patient participated in group on today. Patient was appropriate, attentive, and sharing in his experiences. Patient was able to discuss some of his feelings and behaviors related to the obstacles he has faced. Patient also shared things he feels he is good at and what he loves about his appearance. Patient interacted positively with staff and peers, and was receptive to feedback provided by staff.     Therapeutic Modalities:   Cognitive Behavioral Therapy Solution Focused Therapy Motivational Interviewing Brief Therapy   Lavena Loretto, LCSW, [place] Social Worker  [facility]

## 2022-02-06 NOTE — Progress Notes (Signed)
Pt did not attend scheduled orientation group with Probation officer. Per pt "I'm tired, I will come later today". Remains redirectable at this time.

## 2022-02-07 DIAGNOSIS — F209 Schizophrenia, unspecified: Secondary | ICD-10-CM | POA: Diagnosis not present

## 2022-02-07 MED ORDER — DIVALPROEX SODIUM 250 MG PO DR TAB
750.0000 mg | DELAYED_RELEASE_TABLET | Freq: Two times a day (BID) | ORAL | Status: DC
  Filled 2022-02-07 (×2): qty 60

## 2022-02-07 NOTE — Progress Notes (Signed)
   02/06/22 2100  Psych Admission Type (Psych Patients Only)  Admission Status Involuntary  Psychosocial Assessment  Patient Complaints Anxiety  Eye Contact Fair  Facial Expression Blank  Affect Appropriate to circumstance  Speech Logical/coherent  Interaction Cautious;Guarded  Motor Activity Fidgety  Appearance/Hygiene Unremarkable  Behavior Characteristics Cooperative;Appropriate to situation;Calm  Mood Pleasant  Thought Process  Coherency WDL  Content WDL  Delusions None reported or observed  Perception WDL  Hallucination None reported or observed  Judgment Poor  Confusion None  Danger to Self  Current suicidal ideation? Denies  Self-Injurious Behavior No self-injurious ideation or behavior indicators observed or expressed   Agreement Not to Harm Self Yes  Description of Agreement verbal  Danger to Others  Danger to Others None reported or observed

## 2022-02-07 NOTE — Progress Notes (Signed)
[facility] Group Notes:  (Nursing/MHT/Case Management/Adjunct)  Date:  02/07/2022  Time:  2015  Type of Therapy:   wrap up group  Participation Level:  Active  Participation Quality:  Appropriate, Attentive, Sharing, and Supportive  Affect:  Appropriate  Cognitive:  Alert  Insight:  Improving  Engagement in Group:  Engaged  Modes of Intervention:  Clarification, Education, and Support  Summary of Progress/Problems: Positive thinking and self-care were discussed. Pt reports looking forward to discharge tomorrow. Pt is proud of himself for not getting in trouble today. Pt likes how confident he is. Patient's shares his favorite place is [place], favorite sound is pop music, and favorite taste, soup.   Shellia Cleverly 02/07/2022, 8:40 PM
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
## 2022-02-07 NOTE — Group Note (Signed)
Recreation Therapy Group Note   Group Topic:Self-Esteem  Group Date: 02/07/2022 Start Time: 1000 End Time: 3235 Facilitators: Serra Younan-McCall, LRT,CTRS Location: [address] Dayroom   Goal Area(s) Addresses:  Patient will successfully identify positive attributes about themselves.  Patient will identify healthy ways to increase self-esteem. Patient will acknowledge benefit(s) of improved self-esteem.    Group Description: Haematologist.  Patients were given a picture of a blank mirror outline.  Patients were to imagine looking at themselves at a younger age.  Patients were to come up with advise, encouragement and wisdom they learned over time and share with their younger selves.  Patients were to think of any different choices they would have made based on the things they have experienced.   Affect/Mood: N/A   Participation Level: Did not attend    Clinical Observations/Individualized Feedback:     Plan: Continue to engage patient in RT group sessions 2-3x/week.   Brendan Hogan, LRT,CTRS  02/07/2022 11:23 AM

## 2022-02-08 DIAGNOSIS — F209 Schizophrenia, unspecified: Secondary | ICD-10-CM | POA: Diagnosis not present

## 2022-02-08 MED ORDER — LORAZEPAM 0.5 MG PO TABS: 0.5000 mg | ORAL_TABLET | Freq: Two times a day (BID) | ORAL | 0 refills | Status: DC

## 2022-02-08 MED ORDER — DIVALPROEX SODIUM 250 MG PO DR TAB: 750.0000 mg | DELAYED_RELEASE_TABLET | Freq: Two times a day (BID) | ORAL | 0 refills | Status: DC

## 2022-02-08 MED ORDER — BENZTROPINE MESYLATE 1 MG PO TABS: 1.0000 mg | ORAL_TABLET | Freq: Two times a day (BID) | ORAL | 0 refills | Status: DC

## 2022-02-08 MED ORDER — FLUPHENAZINE DECANOATE 25 MG/ML IJ SOLN: 25.0000 mg | INTRAMUSCULAR | 0 refills | Status: DC

## 2022-02-08 MED ORDER — ZOLPIDEM TARTRATE 10 MG PO TABS: 10.0000 mg | ORAL_TABLET | Freq: Every evening | ORAL | 0 refills | Status: DC | PRN

## 2022-02-08 MED ORDER — FLUPHENAZINE HCL 10 MG PO TABS: 10.0000 mg | ORAL_TABLET | Freq: Two times a day (BID) | ORAL | 0 refills | Status: DC

## 2022-02-08 MED ORDER — NICOTINE POLACRILEX 2 MG MT GUM: 2.0000 mg | CHEWING_GUM | OROMUCOSAL | 0 refills | Status: DC | PRN

## 2022-02-08 MED ORDER — CLONIDINE HCL 0.1 MG PO TABS: 0.1000 mg | ORAL_TABLET | Freq: Two times a day (BID) | ORAL | 0 refills | Status: DC
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
## 2022-02-08 NOTE — Progress Notes (Signed)
Recreation Therapy Notes  INPATIENT RECREATION TR PLAN  Patient Details Name: NORM WRAY MRN: [id number] DOB: Sep 14, 1995 Today's Date: 02/08/2022  Rec Therapy Plan Is patient appropriate for Therapeutic Recreation?: Yes Treatment times per week: about 3 days Estimated Length of Stay: 5-7 days TR Treatment/Interventions: Group participation (Comment)  Discharge Criteria Pt will be discharged from therapy if:: Discharged Treatment plan/goals/alternatives discussed and agreed upon by:: Patient/family  Discharge Summary Short term goals set: See patient care plan Short term goals met: Complete Progress toward goals comments: Groups attended Which groups?: Wellness, Other (Comment) (Team Building) Reason goals not met: None Therapeutic equipment acquired: N/A Reason patient discharged from therapy: Discharge from hospital Pt/family agrees with progress & goals achieved: Yes Date patient discharged from therapy: 02/08/22   Saharah Sherrow-McCall, LRT,CTRS Corissa Oguinn A Sindee Stucker-McCall 02/08/2022, 12:18 PM

## 2022-02-08 NOTE — Progress Notes (Signed)
D/c Instructions-Education: Lethea Killings disharged home per MD order. All D/C information given to patient, patient questions answered, and he verbalised understanding of all information given to him. D/c education completed with patient/family including follow up instructions, medication list, d/c activities limitations if indicated, with other d/c instructions as indicated by MD - patient able to verbalize understanding, all questions fully answered. Patient denied SI/HI/AVH. Suicide prevention information given and discussed with patient/family and understanding of D/C information verbalized by patient/family. All questions asked were answered by staff, patient stated they have no questions at time of D/C. Patient received all belongings brought to the unit at discharge. Patient showed appreciation of care given to him at the [facility] unit. Patient was escorted to the lobby and left for home in private auto at 1330.   Dorris Carnes, RN

## 2022-02-08 NOTE — Group Note (Signed)
Recreation Therapy Group Note   Group Topic:Team Building  Group Date: 02/08/2022 Start Time: 1000 End Time: 1030 Facilitators: Trexton Escamilla-McCall, LRT,CTRS Location: [address] Dayroom   Goal Area(s) Addresses:  Patient will effectively work with peer towards shared goal.  Patient will identify skills used to make activity successful.  Patient will identify how skills used during activity can be applied to reach post d/c goals.   Group Description: [employer]. In teams of 5-6, patients were given 12 craft pipe cleaners. Using the materials provided, patients were instructed to compete again the opposing team(s) to build the tallest free-standing structure from floor level. The activity was timed; difficulty increased by Probation officer as Pharmacist, hospital continued.  Systematically resources were removed with additional directions for example, placing one arm behind their back, working in silence, and shape stipulations. LRT facilitated post-activity discussion reviewing team processes and necessary communication skills involved in completion. Patients were encouraged to reflect how the skills utilized, or not utilized, in this activity can be incorporated to positively impact support systems post discharge.   Affect/Mood: N/A   Participation Level: Did not attend    Clinical Observations/Individualized Feedback:      Plan: Continue to engage patient in RT group sessions 2-3x/week.   Kitt Minardi-McCall, LRT,CTRS 02/08/2022 12:01 PM

## 2022-02-08 NOTE — Progress Notes (Signed)
  [facility] Adult Case Management Discharge Plan :  Will you be returning to the same living situation after discharge:  Yes,  patient will be returning to his fathers place At discharge, do you have transportation home?: Yes,  Father Do you have the ability to pay for your medications: Yes,  Insurance  Release of information consent forms completed and in the chart;  Patient's signature needed at discharge.  Patient to Follow up at:  [place]. Go to.   Specialty: Behavioral Health Why: Please go to this provider for an assessment, to obtain therapy and medication management services, on Monday or Wednesday - arrive by 7:30 am.  Services are provided on a first come, first served basis. Contact information: [place], [place] Follow up.   Contact information: [address] [place] [address] [phone]         [facility] Follow up.   Specialty: Behavioral Health Why: Referral made Contact information: [place]. [place]. Call.   Why: A referral has been placed on your behalf.  They currently have a 2 month waiting period for intake to get connected to ACTT wrap around services.  Please call to follow up with services and schedule an assessment. Contact information: [place] [address] [phone]                 Next level of care provider has access to [place] and Suicide Prevention discussed: Yes,  father and guardian, Hulan Szumski     Has patient been referred to the Quitline?: Patient refused referral  Patient has been referred for addiction treatment: [place], LCSW 02/08/2022, 10:07 AM

## 2022-02-08 NOTE — Plan of Care (Signed)
Patient displayed no violent or destructive behavior in recreation therapy group sessions during admission.   Brendan Hogan, LRT,CTRS

## 2022-02-08 NOTE — Group Note (Signed)
LCSW Group Therapy Note  02/08/2022     11am               Type of Therapy and Topic:  Group Therapy: Conflict Resolution  Participation Level:  Did not attend   Description of Group:   In this group session, patients started with an icebreaker, sharing "something that they would put up a serious fight for, if someone were trying to take it away?". Patients were asked why it is so important to them. CSW introduced group topic of conflict. CSW encouraged patients to sculpt and use their bodies to demonstrate where they would be in relation to conflict and how they tend to handle conflict. Patients were asked to share a disagreement, argument or fight they had with someone - analyzing how it ended and how it made them feel. Patients then utilized their favorite childhood fairy tale to explore and identify conflicts in the story, the characters feelings, wants and needs. Patients were told they could have any outcome they wanted and were asked to identify three other possible outcomes for the fairy tale they chose. CSW explained fairy tales characters aren't the only ones who struggle with conflict and provided psycho-education on conflict resolution. Patients were given two more realistic scenarios (one with bullying and another with issues with a teacher). Patients were asked to identify the conflict, reasons why its important to resolve the conflict, how the people in the scenarios feel and two suggestions to resolve the conflict (one positive). Patients were asked to close in a discussion analyzing why it's important to resolve conflict and how at times conflict can be a good thing (or lead to positive outcomes.  Therapeutic Goals: Patients will remember a time they had a conflict and analyze their reactions. Patients will comprehend concepts related to conflict resolution. Patients will identify feelings and needs behind conflicts. Patients will engage in a sculpting activity. Patients will  utilize a CBT worksheet to explore conflict. Patients will engage in realistic role play scenarios. Patients will learn "I statements" to increase positive communication in situations where conflict arises. Patients will generate creative solutions for resolving conflict cooperatively.   Summary of Patient Progress:  Did not attend  Therapeutic Modalities:   Cognitive Behavioral Therapy Motivational Interviewing  Brief Therapy  Zachery Conch, LCSW  02/08/2022 11:53 AM
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
## 2022-02-08 NOTE — Progress Notes (Signed)
   02/07/22 2100  Psych Admission Type (Psych Patients Only)  Admission Status Involuntary  Psychosocial Assessment  Patient Complaints None  Eye Contact Fair  Facial Expression Animated  Affect Appropriate to circumstance  Speech Logical/coherent  Interaction Cautious  Motor Activity Fidgety  Appearance/Hygiene Unremarkable  Behavior Characteristics Cooperative  Mood Pleasant  Thought Process  Coherency WDL  Content WDL  Delusions None reported or observed  Perception WDL  Hallucination None reported or observed  Judgment WDL  Confusion None  Danger to Self  Current suicidal ideation? Denies  Self-Injurious Behavior No self-injurious ideation or behavior indicators observed or expressed   Agreement Not to Harm Self Yes  Description of Agreement verbal  Danger to Others  Danger to Others None reported or observed

## 2022-02-08 NOTE — Discharge Instructions (Addendum)
Medications: Continue Prolixin 10 mg by mouth twice daily for 5 more doses (provided). Prolixin oral doses continued for one week after injection to allow time to begin working.  Patient due for next Prolixin deconoate 25 mg injection on 10/30. Afterward, discuss with outpatient psychiatrist the need to increase the Prolixin and schedule injections every month.  Continue remainder of medications as prescribed above  -Follow-up with your outpatient psychiatric provider -instructions on appointment date, time, and address (location) are provided to you in discharge paperwork.  -Take your psychiatric medications as prescribed at discharge - instructions are provided to you in the discharge paperwork  -Follow-up with outpatient primary care doctor and other specialists -for management of preventative medicine and any chronic medical disease.  -Recommend abstinence from alcohol, tobacco, and other illicit drug use at discharge.   -If your psychiatric symptoms recur, worsen, or if you have side effects to your psychiatric medications, call your outpatient psychiatric provider, 911, [phone] or go to the nearest emergency department.  -If suicidal thoughts recur, call your outpatient psychiatric provider, 911, [phone] or go to the nearest emergency department.

## 2022-02-18 ENCOUNTER — Encounter (HOSPITAL_COMMUNITY): Payer: Self-pay | Admitting: Psychiatry

## 2022-02-18 ENCOUNTER — Ambulatory Visit (INDEPENDENT_AMBULATORY_CARE_PROVIDER_SITE_OTHER): Payer: Commercial Managed Care - HMO | Admitting: Psychiatry

## 2022-02-18 DIAGNOSIS — F5101 Primary insomnia: Secondary | ICD-10-CM | POA: Diagnosis not present

## 2022-02-18 DIAGNOSIS — F17210 Nicotine dependence, cigarettes, uncomplicated: Secondary | ICD-10-CM

## 2022-02-18 DIAGNOSIS — F209 Schizophrenia, unspecified: Secondary | ICD-10-CM | POA: Diagnosis not present

## 2022-02-18 DIAGNOSIS — F17200 Nicotine dependence, unspecified, uncomplicated: Secondary | ICD-10-CM

## 2022-02-18 MED ORDER — BENZTROPINE MESYLATE 1 MG PO TABS: 1.0000 mg | ORAL_TABLET | Freq: Two times a day (BID) | ORAL | 3 refills | Status: DC

## 2022-02-18 MED ORDER — NICOTINE POLACRILEX 2 MG MT GUM: 2.0000 mg | CHEWING_GUM | OROMUCOSAL | 3 refills | Status: DC | PRN

## 2022-02-18 MED ORDER — FLUPHENAZINE DECANOATE 25 MG/ML IJ SOLN: 25.0000 mg | INTRAMUSCULAR | 11 refills | Status: DC

## 2022-02-18 MED ORDER — CLONIDINE HCL 0.1 MG PO TABS: 0.1000 mg | ORAL_TABLET | Freq: Two times a day (BID) | ORAL | 3 refills | Status: DC

## 2022-02-18 MED ORDER — DIVALPROEX SODIUM 250 MG PO DR TAB: 750.0000 mg | DELAYED_RELEASE_TABLET | Freq: Two times a day (BID) | ORAL | 3 refills | Status: DC

## 2022-02-18 MED ORDER — ZOLPIDEM TARTRATE 10 MG PO TABS: 10.0000 mg | ORAL_TABLET | Freq: Every evening | ORAL | 2 refills | Status: DC | PRN

## 2022-02-18 NOTE — Progress Notes (Signed)
Psychiatric Initial Adult Assessment  Virtual Visit via Telephone Note  I connected with Brendan Hogan on 02/18/22 at  1:30 PM EDT by telephone and verified that I am speaking with the correct person using two identifiers.  Location: Patient: home Provider: Clinic   I discussed the limitations, risks, security and privacy concerns of performing an evaluation and management service by telephone and the availability of in person appointments. I also discussed with the patient that there may be a patient responsible charge related to this service. The patient expressed understanding and agreed to proceed.   I provided 30 minutes of non-face-to-face time during this encounter.  Patient Identification: Brendan Hogan MRN:  [id number] Date of Evaluation:  02/18/2022 Referral Source: [facility] Chief Complaint:  "I am feeling fine" Visit Diagnosis:    ICD-10-CM   1. Primary insomnia  F51.01 benztropine (COGENTIN) 1 MG tablet    zolpidem (AMBIEN) 10 MG tablet    2. Schizophrenia, unspecified type ([place])  F20.9 benztropine (COGENTIN) 1 MG tablet    cloNIDine (CATAPRES) 0.1 MG tablet    divalproex (DEPAKOTE) 250 MG DR tablet    fluPHENAZine decanoate (PROLIXIN) 25 MG/ML injection    3. Tobacco dependence  F17.200 nicotine polacrilex (NICORETTE) 2 MG gum      History of Present Illness: 26 year old male seen today for initial psychiatric evaluation.  He was recently admitted to [facility] on 01/20/2022 to 02/08/2022 for increasing psychosis.  Per chart review patient was brought to [facility] long ED for aggressive behaviors.  He has a psychiatric history of schizophrenia, SI, and HI.  Currently he is managed on Cogentin 1 mg twice daily, clonidine 0.1 mg 2 times daily, Depakote 750 milligrams daily, Prolixin 25 mg every 21 days (next injection due 02/25/2022, Nicorette gum 2 mg, Ambien 10 mg nightly, and Ativan 0.5 mg twice daily.  Patient reports that his medications are effective in managing his  psychiatric conditions.  He does have a history of medication noncompliance.  Today patient was unable to login virtually so assessment is done on phone.  During exam he was pleasant, cooperative, and engaged in conversation.  He informed Probation officer that since his hospitalization he feels fine.  He however notes that he dislikes injection and reports that he no longer wants it.  Provider asked patient to speak with his guardian regarding starting oral Prolixin however patient notes that his guardian/father was yelling at him and notes that he did not want to speak.  He informed Probation officer that for now he will continue Prolixin injection.  Patient notes that his mood is stable and reports that he no longer has symptoms of psychosis.  Provider conducted a GAD-7 and patient scored a 1.  Provider also conducted a PHQ-9 and patient scored a 0.  He endorses adequate sleep and appetite.  Today he denies SI/HI/VAH, mania, paranoia.  Patient reports that he has not used marijuana in over 6 months.  He notes that he drinks alcohol socially and chews chewing tobacco daily.  No medication changes made today.  Patient agreeable to continue medication as prescribed.  Patient is due for his Prolixin injection on 02/25/2022.  Provider informed patient that switching to oral Prolixin (patient last prescribed Prolixin 10 mg twice daily) can be discussed with his guardian and his shot appointment.  He endorsed understanding and agreed.  No other concerns at this time.  Associated Signs/Symptoms: Depression Symptoms:   Denies (Hypo) Manic Symptoms:   Denies Anxiety Symptoms:   Denies Psychotic Symptoms:  Denies PTSD Symptoms: NA  Past Psychiatric History: Schizophrenia, SA/SI, insomnia HI  Previous Psychotropic Medications:  Abilify was most effective but reports med noncompliance, Haldol (caused EPS and neck stiffness), Zyprexa, risperidone, Risperdal Consta, trazodone, Ambien, mirtazapine, hydroxyzine, Tegretol, Seroquel,  Nicorette gum   Substance Abuse History in the last 12 months:  No.  Consequences of Substance Abuse: NA  Past Medical History:  Past Medical History:  Diagnosis Date   Psychosis (HCC) 05/2019   [facility]    Seasonal allergies     Past Surgical History:  Procedure Laterality Date   TONSILLECTOMY      Family Psychiatric History: Denies  Family History:  Family History  Problem Relation Age of Onset   Hypertension Mother     Social History:   Social History   Socioeconomic History   Marital status: Single    Spouse name: Not on file   Number of children: Not on file   Years of education: Not on file   Highest education level: Not on file  Occupational History   Not on file  Tobacco Use   Smoking status: Every Day    Packs/day: 1.00    Types: Cigarettes   Smokeless tobacco: Former  Building services engineer Use: Never used  Substance and Sexual Activity   Alcohol use: Not Currently    Comment: rare   Drug use: No   Sexual activity: Not Currently  Other Topics Concern   Not on file  Social History Narrative   Not on file   Social Determinants of Health   Financial Resource Strain: Not on file  Food Insecurity: No Food Insecurity (01/20/2022)   Hunger Vital Sign    Worried About Running Out of Food in the Last Year: Never true    Ran Out of Food in the Last Year: Never true  Transportation Needs: No Transportation Needs (01/20/2022)   PRAPARE - Administrator, Civil Service (Medical): No    Lack of Transportation (Non-Medical): No  Physical Activity: Not on file  Stress: Not on file  Social Connections: Not on file    Additional Social History: Patient resides in [place] with his parents. He is single and has no children. He notes that he drink alcohol socially. He chews chewing daily. He denies illegal drug use.  Currently he is unemployed  Allergies:   Allergies  Allergen Reactions   Haldol [Haloperidol] Other (See  Comments)    Muscle spasms- involuntary muscle contractions   Geodon [Ziprasidone] Other (See Comments)    Patient reports that this medicine made him feel bad. He has difficult describing symptoms. He requests not to take anymore.     Metabolic Disorder Labs: Lab Results  Component Value Date   HGBA1C 4.8 01/23/2022   MPG 91.06 01/23/2022   MPG 100 05/19/2021   Lab Results  Component Value Date   PROLACTIN 26.5 (H) 09/08/2019   Lab Results  Component Value Date   CHOL 151 01/23/2022   TRIG 53 01/23/2022   HDL 49 01/23/2022   CHOLHDL 3.1 01/23/2022   VLDL 11 01/23/2022   LDLCALC 91 01/23/2022   LDLCALC 62 05/19/2021   Lab Results  Component Value Date   TSH 3.008 01/23/2022    Therapeutic Level Labs: No results found for: "LITHIUM" Lab Results  Component Value Date   CBMZ <2.0 (L) 05/14/2021   Lab Results  Component Value Date   VALPROATE 53 02/05/2022    Current Medications: Current  Outpatient Medications  Medication Sig Dispense Refill   benztropine (COGENTIN) 1 MG tablet Take 1 tablet (1 mg total) by mouth 2 (two) times daily. 60 tablet 3   cloNIDine (CATAPRES) 0.1 MG tablet Take 1 tablet (0.1 mg total) by mouth 2 (two) times daily. 60 tablet 3   divalproex (DEPAKOTE) 250 MG DR tablet Take 3 tablets (750 mg total) by mouth every 12 (twelve) hours. 180 tablet 3   fluPHENAZine (PROLIXIN) 10 MG tablet Take 1 tablet (10 mg total) by mouth every 12 (twelve) hours. 5 tablet 0   [START ON 02/25/2022] fluPHENAZine decanoate (PROLIXIN) 25 MG/ML injection Inject 1 mL (25 mg total) into the muscle every 21 ( twenty-one) days. NEXT ADMINISTRATION IS DUE ON 02-25-2022. 5 mL 11   nicotine polacrilex (NICORETTE) 2 MG gum Take 1 each (2 mg total) by mouth as needed for smoking cessation. 100 tablet 3   zolpidem (AMBIEN) 10 MG tablet Take 1 tablet (10 mg total) by mouth at bedtime as needed for sleep. 30 tablet 2   No current facility-administered medications for this visit.     Musculoskeletal: Strength & Muscle Tone:  Unable to assess due to telephone visit Gait & Station:  Unable to assess due to telephone visit Patient leans: N/A  Psychiatric Specialty Exam: Review of Systems  There were no vitals taken for this visit.There is no height or weight on file to calculate BMI.  General Appearance:  Unable to assess due to telephone visit  Eye Contact:   Unable to assess due to telephone visit  Speech:  Clear and Coherent and Normal Rate  Volume:  Normal  Mood:  Euthymic  Affect:  Appropriate and Congruent  Thought Process:  Coherent, Goal Directed, and Linear  Orientation:  Full (Time, Place, and Person)  Thought Content:  WDL and Logical  Suicidal Thoughts:  No  Homicidal Thoughts:  No  Memory:  Immediate;   Good Recent;   Good Remote;   Good  Judgement:  Good  Insight:  Good  Psychomotor Activity:   Unable to assess due to telephone visit  Concentration:  Concentration: Good and Attention Span: Good  Recall:  Good  Fund of Knowledge:Good  Language: Good  Akathisia:  No  Handed:  Right  AIMS (if indicated):  not done  Assets:  Communication Skills Desire for Improvement Housing Leisure Time Physical Health Social Support  ADL's:  Intact  Cognition: WNL  Sleep:  Good   Screenings: AIMS    Flowsheet Row [facility]) [facility]  AIMS Total Score 0 0 0 0      AUDIT    Flowsheet Row [facility]) [facility] (Discharged[facility] from [facility]/18/2021 in [facility] Ad[facility] ([facility]) [facility]) [facility]  Alcohol Use Disorder Identification Test Final Score (AUDIT) 0 2 0 0 0      GAD-7    Flowsheet Row Office Visit from 02/18/2022 in [facility]  Total GAD-7 Score 1      PHQ2-9    Flowsheet Row Office Visit from  02/18/2022 in [facility] Office Visit from 07/19/2020 in [facility] ED from 05/29/2020 in [facility]  PHQ-2 Total Score 0 2 1  PHQ-9 Total Score 0 8 --      Flowsheet Row Office Visit from 02/18/2022 in [facility] Admission (Discharged) [facility] from [facility]/08/2022 in [facility]  C-SSRS RISK CATEGORY No Risk No Risk Moderate Risk       Assessment and Plan: Patient reports that he is doing well on his current medication regimen.  He notes at times he dislikes taking his monthly injection. No medication changes made today.  Patient agreeable to continue medication as prescribed.  Patient is due for his Prolixin injection on 02/25/2022.  Provider informed patient that switching to oral Prolixin (patient last prescribed Prolixin 10 mg twice daily) can be discussed with his guardian and his shot appointment.  He endorsed understanding and agreed  1. Primary insomnia  Continue- benztropine (COGENTIN) 1 MG tablet; Take 1 tablet (1 mg total) by mouth 2 (two) times daily.  Dispense: 60 tablet; Refill: 3 Continue- zolpidem (AMBIEN) 10 MG tablet; Take 1 tablet (10 mg total) by mouth at bedtime as needed for sleep.  Dispense: 30 tablet; Refill: 2  2. Schizophrenia, unspecified type (HCC)  Continue- benztropine (COGENTIN) 1 MG tablet; Take 1 tablet (1 mg total) by mouth 2 (two)  times daily.  Dispense: 60 tablet; Refill: 3 Continue- cloNIDine (CATAPRES) 0.1 MG tablet; Take 1 tablet (0.1 mg total) by mouth 2 (two) times daily.  Dispense: 60 tablet; Refill: 3 Continue- divalproex (DEPAKOTE) 250 MG DR tablet; Take 3 tablets (750 mg total) by mouth every 12 (twelve) hours.  Dispense: 180 tablet; Refill: 3 Continue- fluPHENAZine decanoate (PROLIXIN) 25 MG/ML injection; Inject 1 mL (25 mg total) into the muscle every 21 ( twenty-one) days. NEXT ADMINISTRATION IS DUE ON 02-25-2022.  Dispense: 5 mL; Refill: 11  3. Tobacco dependence  Continue- nicotine polacrilex (NICORETTE) 2 MG gum; Take 1 each (2 mg total) by mouth as needed for smoking cessation.  Dispense: 100 tablet; Refill: 3   Collaboration of Care: Other provider involved in patient's care AEB shot clinic staff and PCP  Patient/Guardian was advised Release of Information must be obtained prior to any record release in order to collaborate their care with an outside provider. Patient/Guardian was advised if they have not already done so to contact the registration department to sign all necessary forms in order for [place] to release information regarding their care.   Consent: Patient/Guardian gives verbal consent for treatment and assignment of benefits for services provided during this visit. Patient/Guardian expressed understanding and agreed to proceed.   Follow-up in 3 months Follow-up with shot clinic  Shanna Cisco, NP 10/23/202312:46 PM

## 2022-02-19 ENCOUNTER — Encounter (HOSPITAL_COMMUNITY): Payer: Self-pay | Admitting: Emergency Medicine

## 2022-02-19 ENCOUNTER — Other Ambulatory Visit: Payer: Self-pay

## 2022-02-19 ENCOUNTER — Emergency Department (HOSPITAL_COMMUNITY)
Admission: EM | Admit: 2022-02-19 | Discharge: 2022-02-19 | Disposition: A | Discharge status: Home / Self Care | Payer: Commercial Managed Care - HMO | Attending: Emergency Medicine | Admitting: Emergency Medicine

## 2022-02-19 DIAGNOSIS — F5101 Primary insomnia: Secondary | ICD-10-CM | POA: Insufficient documentation

## 2022-02-19 DIAGNOSIS — G4700 Insomnia, unspecified: Secondary | ICD-10-CM | POA: Diagnosis present

## 2022-02-19 NOTE — ED Triage Notes (Signed)
Pt in with mother and father, appears anxious and c/o restless legs. Reporting insomnia, and states the pharmacy couldn't fill his Ambien since prior was filled too recently. Denies any SI/HI, AH/VH

## 2022-02-19 NOTE — ED Provider Notes (Signed)
[facility] DEPT Provider Note   CSN: [id number] Arrival date & time: 02/19/22  0413     History  Chief Complaint  Patient presents with   Insomnia    Brendan Hogan is a 26 y.o. male.  The history is provided by the patient and a parent.  Insomnia  He has history of schizophrenia and primary insomnia and comes in complaining of inability to sleep.  He had been prescribed zolpidem which had been giving him relief, but he states that he spilled the bottle and therefore does not have any at home.  He also states that he is very anxious.  He denies hallucinations, suicidal ideation, homicidal ideation.  Parents expressed concern that he is using illicit drugs, patient denies this.   Home Medications Prior to Admission medications   Medication Sig Start Date End Date Taking? Authorizing Provider  benztropine (COGENTIN) 1 MG tablet Take 1 tablet (1 mg total) by mouth 2 (two) times daily. 02/18/22   Salley Slaughter, NP  cloNIDine (CATAPRES) 0.1 MG tablet Take 1 tablet (0.1 mg total) by mouth 2 (two) times daily. 02/18/22   Salley Slaughter, NP  divalproex (DEPAKOTE) 250 MG DR tablet Take 3 tablets (750 mg total) by mouth every 12 (twelve) hours. 02/18/22   Salley Slaughter, NP  fluPHENAZine (PROLIXIN) 10 MG tablet Take 1 tablet (10 mg total) by mouth every 12 (twelve) hours. 02/08/22   Massengill, Ovid Curd, MD  fluPHENAZine decanoate (PROLIXIN) 25 MG/ML injection Inject 1 mL (25 mg total) into the muscle every 21 ( twenty-one) days. NEXT ADMINISTRATION IS DUE ON 02-25-2022. 02/25/22   Eulis Canner E, NP  nicotine polacrilex (NICORETTE) 2 MG gum Take 1 each (2 mg total) by mouth as needed for smoking cessation. 02/18/22   Salley Slaughter, NP  zolpidem (AMBIEN) 10 MG tablet Take 1 tablet (10 mg total) by mouth at bedtime as needed for sleep. 02/18/22   Salley Slaughter, NP      Allergies    Haldol [haloperidol] and Geodon [ziprasidone]     Review of Systems   Review of Systems  Psychiatric/Behavioral:  The patient has insomnia.   All other systems reviewed and are negative.   Physical Exam Updated Vital Signs BP 120/79 (BP Location: Right Arm)   Pulse 89   Temp 98.3 F (36.8 C) (Oral)   Resp 18   Ht 5\' 5"  (1.651 m)   Wt 59 kg   SpO2 98%   BMI 21.63 kg/m  Physical Exam Vitals and nursing note reviewed.   26 year old male, pacing the room and constantly asking if he can go home, but is in no acute distress. Vital signs are normal. Oxygen saturation is 98%, which is normal. Head is normocephalic and atraumatic. PERRLA, EOMI. Oropharynx is clear. Neck is nontender and supple without adenopathy or JVD. Back is nontender and there is no CVA tenderness. Lungs are clear without rales, wheezes, or rhonchi. Chest is nontender. Heart has regular rate and rhythm without murmur. Abdomen is soft, flat, nontender. Extremities have no cyanosis or edema, full range of motion is present. Skin is warm and dry without rash. Neurologic: Mental status is normal, cranial nerves are intact, moves all extremities equally. Psychiatric: Somewhat hyperactive and constantly pacing in the room but does not appear to be responding to internal stimuli.  ED Results / Procedures / Treatments    Procedures Procedures    Medications Ordered in ED Medications - No data to  display  ED Course/ Medical Decision Making/ A&P                           Medical Decision Making  Insomnia.  I have reviewed his old records, and he had been seen yesterday by nurse practitioner at behavioral health who had prescribed zolpidem for him.  However, on review of his record and the [place] controlled substance reporting website, previous prescription for 14 zolpidem had been filled on October 13.  I have explained to him that no pharmacy would be able to fill a new zolpidem prescription until he was due based on the previous prescription.  I have  recommended that he can try over-the-counter diphenhydramine to see if it helps with his sleep.  He is welcome to return if he has any other problems or concerns.  Final Clinical Impression(s) / ED Diagnoses Final diagnoses:  Primary insomnia    Rx / DC Orders ED Discharge Orders     None         Dione Booze, MD 02/19/22 [phone]

## 2022-02-19 NOTE — Discharge Instructions (Signed)
Pharmacies are not allowed to dispense medication until your prior prescription should have expired.  You should be able to pick up the new prescription for zolpidem (Ambien) and 3-4 days.  Until then, you may try taking diphenhydramine (Benadryl) 25-50 mg at bedtime to see if it will help you sleep.

## 2022-02-19 NOTE — ED Notes (Signed)
Pt's father pulled this RN aside and said that pt has been overdosing on some of his medications, and possibly recreational drugs

## 2022-02-19 NOTE — ED Notes (Signed)
Patient verbalizes understanding of discharge instructions. Opportunity for questioning and answers were provided. Armband removed by staff, pt discharged from ED. Ambulated out to lobby with parents  

## 2022-02-25 ENCOUNTER — Telehealth (HOSPITAL_COMMUNITY): Payer: Self-pay | Admitting: Psychiatry

## 2022-02-25 ENCOUNTER — Other Ambulatory Visit (HOSPITAL_COMMUNITY): Payer: Self-pay | Admitting: Psychiatry

## 2022-02-25 NOTE — Telephone Encounter (Signed)
Provider spoke to patient's father who notes that he misuses Ambien.  He notes that at times he takes 3-5 times the recommended dose.  Provider's father also notes that he mixes Ambien with alcohol.  Patient went to the ED a after speaking with provider requesting Ambien on 02/19/2022.  Provider also spoke to pharmacy staff who notes that patient frequently comes in early to refill his Ambien.  Pharmacy staff reports that they suspect misuse.  Provider attempted to call patient however he did not pick up the phone.  Provider left a message informing patient that Ambien would be discontinued at this time.  Provider informed patient via voicemail if sleep continues to be an issue Hetlioz, trazodone, doxepin, or mirtazapine could be trialed.  Provider instructed patient to call the clinic for further questions or concerns.  No other concerns noted at this time.

## 2022-02-25 NOTE — Telephone Encounter (Signed)
Patient father stopped by office wanting to make provider aware that his son has been abusing his medication Ambien. Father reports that son was prescribed a 14 day supply, and took it within 5 days. Patient father states that the patient has been taking the pills, and using alcohol. Patient dad states that patient has 3 refills on his medication, and would like for the to be changed, or put on hold until his next visit. Father states that he becomes aggressive when money is not given to him to drink. Father states that he in fear of his son life the way he is taking medication. Father would like if you can give him a call to communicate furthermore. Father is on DPR forms, and able to communicate and provide information on his son behalf. He can be reached [phone]

## 2022-02-26 ENCOUNTER — Ambulatory Visit (HOSPITAL_COMMUNITY): Payer: Commercial Managed Care - HMO

## 2022-03-06 ENCOUNTER — Other Ambulatory Visit (HOSPITAL_COMMUNITY): Payer: Self-pay | Admitting: Psychiatry

## 2022-03-06 MED ORDER — TRAZODONE HCL 50 MG PO TABS: 50.0000 mg | ORAL_TABLET | Freq: Every day | ORAL | 3 refills | Status: DC

## 2022-03-06 NOTE — Telephone Encounter (Signed)
Patient came into office requesting medication. Patient want the medication that has been discontinued for misuse. Patient is requesting a call be made to him explaing why he cant have it. patient was cussing a reception when told a script could not be called in for at the moment until a OV scheduled

## 2022-03-06 NOTE — Telephone Encounter (Signed)
Provider called patient he does know that he would like to restart his Ambien.  Patient informed that Ambien would not be prescribed due to potential misuse.  Provider informed patient's family member as well as pharmacy staff that patient has been misusing his medication.  Provider asked patient if he was interested in trazodone as needed.  He notes that he was.  Trazodone 50 mg sent to preferred pharmacy.  Provider got disconnected from patient.  Writer attempted to call patient 3 times without success.  No other concerns at this time.

## 2022-03-11 ENCOUNTER — Emergency Department (HOSPITAL_COMMUNITY)
Admission: EM | Admit: 2022-03-11 | Discharge: 2022-03-12 | Disposition: A | Discharge status: Other Acute Inpatient Hospital | Payer: Commercial Managed Care - HMO | Attending: Emergency Medicine | Admitting: Emergency Medicine

## 2022-03-11 ENCOUNTER — Other Ambulatory Visit: Payer: Self-pay

## 2022-03-11 ENCOUNTER — Encounter (HOSPITAL_COMMUNITY): Payer: Self-pay

## 2022-03-11 DIAGNOSIS — F17210 Nicotine dependence, cigarettes, uncomplicated: Secondary | ICD-10-CM | POA: Insufficient documentation

## 2022-03-11 DIAGNOSIS — Y900 Blood alcohol level of less than 20 mg/100 ml: Secondary | ICD-10-CM | POA: Diagnosis not present

## 2022-03-11 DIAGNOSIS — R451 Restlessness and agitation: Secondary | ICD-10-CM | POA: Diagnosis not present

## 2022-03-11 DIAGNOSIS — Z046 Encounter for general psychiatric examination, requested by authority: Secondary | ICD-10-CM | POA: Diagnosis present

## 2022-03-11 DIAGNOSIS — F209 Schizophrenia, unspecified: Secondary | ICD-10-CM | POA: Diagnosis not present

## 2022-03-11 DIAGNOSIS — F201 Disorganized schizophrenia: Secondary | ICD-10-CM | POA: Diagnosis present

## 2022-03-11 DIAGNOSIS — G4700 Insomnia, unspecified: Secondary | ICD-10-CM | POA: Diagnosis not present

## 2022-03-11 DIAGNOSIS — R410 Disorientation, unspecified: Secondary | ICD-10-CM | POA: Insufficient documentation

## 2022-03-11 DIAGNOSIS — F451 Undifferentiated somatoform disorder: Secondary | ICD-10-CM | POA: Diagnosis not present

## 2022-03-11 DIAGNOSIS — Z20822 Contact with and (suspected) exposure to covid-19: Secondary | ICD-10-CM | POA: Insufficient documentation

## 2022-03-11 LAB — CBC WITH DIFFERENTIAL/PLATELET
Abs Immature Granulocytes: 0.01 10*3/uL (ref 0.00–0.07)
Basophils Absolute: 0.1 10*3/uL (ref 0.0–0.1)
Basophils Relative: 1 %
Eosinophils Absolute: 0.1 10*3/uL (ref 0.0–0.5)
Eosinophils Relative: 1 %
HCT: 45.4 % (ref 39.0–52.0)
Hemoglobin: 15.3 g/dL (ref 13.0–17.0)
Immature Granulocytes: 0 %
Lymphocytes Relative: 32 %
Lymphs Abs: 1.5 10*3/uL (ref 0.7–4.0)
MCH: 29 pg (ref 26.0–34.0)
MCHC: 33.7 g/dL (ref 30.0–36.0)
MCV: 86.1 fL (ref 80.0–100.0)
Monocytes Absolute: 0.7 10*3/uL (ref 0.1–1.0)
Monocytes Relative: 14 %
Neutro Abs: 2.4 10*3/uL (ref 1.7–7.7)
Neutrophils Relative %: 52 %
Platelets: 259 10*3/uL (ref 150–400)
RBC: 5.27 MIL/uL (ref 4.22–5.81)
RDW: 12.4 % (ref 11.5–15.5)
WBC: 4.6 10*3/uL (ref 4.0–10.5)
nRBC: 0 % (ref 0.0–0.2)

## 2022-03-11 LAB — COMPREHENSIVE METABOLIC PANEL
ALT: 19 U/L (ref 0–44)
AST: 40 U/L (ref 15–41)
Albumin: 4.8 g/dL (ref 3.5–5.0)
Alkaline Phosphatase: 90 U/L (ref 38–126)
Anion gap: 15 (ref 5–15)
BUN: 9 mg/dL (ref 6–20)
CO2: 23 mmol/L (ref 22–32)
Calcium: 10 mg/dL (ref 8.9–10.3)
Chloride: 103 mmol/L (ref 98–111)
Creatinine, Ser: 1 mg/dL (ref 0.61–1.24)
GFR, Estimated: >60 mL/min (ref >60)
Glucose, Bld: 99 mg/dL (ref 70–99)
Potassium: 3.3 mmol/L — ABNORMAL LOW (ref 3.5–5.1)
Sodium: 141 mmol/L (ref 135–145)
Total Bilirubin: 0.8 mg/dL (ref 0.3–1.2)
Total Protein: 7.7 g/dL (ref 6.5–8.1)

## 2022-03-11 LAB — ETHANOL: Alcohol, Ethyl (B): <10 mg/dL (ref <10)

## 2022-03-11 MED ORDER — STERILE WATER FOR INJECTION IJ SOLN
INTRAMUSCULAR | Status: AC
  Administered 2022-03-11: 10 mL
  Filled 2022-03-11: qty 10

## 2022-03-11 MED ORDER — FLUPHENAZINE HCL 5 MG PO TABS
10.0000 mg | ORAL_TABLET | Freq: Two times a day (BID) | ORAL | Status: DC
  Filled 2022-03-11 (×3): qty 2

## 2022-03-11 MED ORDER — LORAZEPAM 1 MG PO TABS
1.0000 mg | ORAL_TABLET | Freq: Once | ORAL | Status: DC
  Filled 2022-03-11 (×2): qty 1

## 2022-03-11 MED ORDER — TRAZODONE HCL 50 MG PO TABS
50.0000 mg | ORAL_TABLET | Freq: Every day | ORAL | Status: DC
  Filled 2022-03-11: qty 1

## 2022-03-11 MED ORDER — HYDROXYZINE HCL 25 MG PO TABS
25.0000 mg | ORAL_TABLET | Freq: Once | ORAL | Status: DC
  Filled 2022-03-11: qty 1

## 2022-03-11 MED ORDER — DIVALPROEX SODIUM 500 MG PO DR TAB
750.0000 mg | DELAYED_RELEASE_TABLET | Freq: Two times a day (BID) | ORAL | Status: DC
  Filled 2022-03-11: qty 3

## 2022-03-11 MED ORDER — OLANZAPINE 10 MG IM SOLR
5.0000 mg | Freq: Once | INTRAMUSCULAR | Status: AC
  Administered 2022-03-11: 5 mg via INTRAMUSCULAR
  Filled 2022-03-11: qty 10

## 2022-03-11 MED ORDER — BENZTROPINE MESYLATE 1 MG PO TABS
1.0000 mg | ORAL_TABLET | Freq: Two times a day (BID) | ORAL | Status: DC
  Filled 2022-03-11 (×3): qty 1

## 2022-03-11 MED ORDER — CLONIDINE HCL 0.1 MG PO TABS
0.1000 mg | ORAL_TABLET | Freq: Two times a day (BID) | ORAL | Status: DC
  Filled 2022-03-11: qty 1

## 2022-03-11 MED ORDER — NICOTINE POLACRILEX 2 MG MT GUM
2.0000 mg | CHEWING_GUM | OROMUCOSAL | Status: DC | PRN
  Filled 2022-03-11: qty 1

## 2022-03-11 NOTE — ED Notes (Signed)
Pt states he does not know why he is here. Pt c/o pain in arms, asking for stuff. PD reports bizarre behavior.

## 2022-03-11 NOTE — ED Triage Notes (Signed)
Pt with hx of schizophrenia here with [name] here IVC'd by father for abnormal behavior. Pt in cuffs and uncooperative with them. [place] [facility] walls on arrival to ED.

## 2022-03-11 NOTE — ED Notes (Signed)
Pt belongings placed in locker #12

## 2022-03-11 NOTE — ED Notes (Signed)
IVC paperwork complete and in the orange zone, expires 03/18/22

## 2022-03-11 NOTE — ED Provider Triage Note (Signed)
Emergency Medicine Provider Triage Evaluation Note  Brendan Hogan , a 26 y.o. male  was evaluated in triage.  Pt complains of IVC.  Pt here with police . Pt is hallucinating   Review of Systems  Positive: Hx of mental health issues Negative:   Physical Exam  There were no vitals taken for this visit. Gen:   Awake, no distress   Resp:  Normal effort  MSK:   Moves extremities without difficulty  Other:    Medical Decision Making  Medically screening exam initiated at 7:11 PM.  Appropriate orders placed.  ROMANO STIGGER was informed that the remainder of the evaluation will be completed by another provider, this initial triage assessment does not replace that evaluation, and the importance of remaining in the ED until their evaluation is complete.     Elson Areas, [place] 03/11/22 1914

## 2022-03-11 NOTE — ED Provider Notes (Signed)
[facility] EMERGENCY DEPARTMENT Provider Note   CSN: [id number] Arrival date & time: 03/11/22  1830     History  Chief Complaint  Patient presents with   IVC    Brendan Hogan is a 26 y.o. male.  HPI Patient presents under involuntary admit under police escort.  Patient has little insight into 1 event that allegedly occurred within the past 24 hours.  Per chart review the patient has a history of schizophrenia, questionable Ambien misuse.  Reported the patient was aggressive, responding to auditory hallucinations yesterday, acting in an inappropriate manner.  Patient denies complaints, is unaware of these possible occurrences.    Home Medications Prior to Admission medications   Medication Sig Start Date End Date Taking? Authorizing Provider  benztropine (COGENTIN) 1 MG tablet Take 1 tablet (1 mg total) by mouth 2 (two) times daily. 02/18/22  Yes Toy Cookey E, NP  cloNIDine (CATAPRES) 0.1 MG tablet Take 1 tablet (0.1 mg total) by mouth 2 (two) times daily. 02/18/22  Yes Toy Cookey E, NP  divalproex (DEPAKOTE) 250 MG DR tablet Take 3 tablets (750 mg total) by mouth every 12 (twelve) hours. 02/18/22  Yes Toy Cookey E, NP  fluPHENAZine decanoate (PROLIXIN) 25 MG/ML injection Inject 1 mL (25 mg total) into the muscle every 21 ( twenty-one) days. NEXT ADMINISTRATION IS DUE ON 02-25-2022. 02/25/22  Yes Toy Cookey E, NP  nicotine polacrilex (NICORETTE) 2 MG gum Take 1 each (2 mg total) by mouth as needed for smoking cessation. 02/18/22  Yes Toy Cookey E, NP  traZODone (DESYREL) 50 MG tablet Take 1 tablet (50 mg total) by mouth at bedtime. 03/06/22  Yes Toy Cookey E, NP  zolpidem (AMBIEN) 10 MG tablet Take 10 mg by mouth at bedtime as needed for sleep.   Yes [provider]  fluPHENAZine (PROLIXIN) 10 MG tablet Take 1 tablet (10 mg total) by mouth every 12 (twelve) hours. Patient not taking: Reported on 03/11/2022  02/08/22   Phineas Inches, MD      Allergies    Haldol [haloperidol] and Geodon [ziprasidone]    Review of Systems   Review of Systems  All other systems reviewed and are negative.   Physical Exam Updated Vital Signs BP 110/80 (BP Location: Right Arm)   Pulse 93   Temp 98.2 F (36.8 C) (Oral)   Resp 16   Ht 5\' 5"  (1.651 m)   Wt 63.5 kg   SpO2 99%   BMI 23.30 kg/m  Physical Exam Vitals and nursing note reviewed.  Constitutional:      General: He is not in acute distress.    Appearance: He is well-developed.  HENT:     Head: Normocephalic and atraumatic.  Eyes:     Conjunctiva/sclera: Conjunctivae normal.  Cardiovascular:     Rate and Rhythm: Normal rate and regular rhythm.  Pulmonary:     Effort: Pulmonary effort is normal. No respiratory distress.     Breath sounds: No stridor.  Abdominal:     General: There is no distension.  Skin:    General: Skin is warm and dry.  Neurological:     Mental Status: He is alert and oriented to person, place, and time.  Psychiatric:     Comments: Disruptive, minimally directable, aggressive with nursing     ED Results / Procedures / Treatments   Labs (all labs ordered are listed, but only abnormal results are displayed) Labs Reviewed  COMPREHENSIVE METABOLIC PANEL - Abnormal; Notable for the  following components:      Result Value   Potassium 3.3 (*)    All other components within normal limits  RESP PANEL BY RT-PCR (FLU A&B, COVID) ARPGX2  CBC WITH DIFFERENTIAL/PLATELET  ETHANOL  RAPID URINE DRUG SCREEN, HOSP PERFORMED  VALPROIC ACID LEVEL    EKG None  Radiology No results found.  Procedures Procedures    Medications Ordered in ED Medications  LORazepam (ATIVAN) tablet 1 mg (1 mg Oral Patient Refused/Not Given 03/11/22 2053)  hydrOXYzine (ATARAX) tablet 25 mg (25 mg Oral Not Given 03/11/22 2220)  benztropine (COGENTIN) tablet 1 mg (1 mg Oral Not Given 03/11/22 2219)  cloNIDine (CATAPRES) tablet 0.1 mg  (0.1 mg Oral Not Given 03/11/22 2220)  divalproex (DEPAKOTE) DR tablet 750 mg (750 mg Oral Not Given 03/11/22 2220)  fluPHENAZine (PROLIXIN) tablet 10 mg (10 mg Oral Not Given 03/11/22 2220)  nicotine polacrilex (NICORETTE) gum 2 mg (has no administration in time range)  traZODone (DESYREL) tablet 50 mg (50 mg Oral Not Given 03/11/22 2220)  OLANZapine (ZYPREXA) injection 5 mg (5 mg Intramuscular Given 03/11/22 2213)  sterile water (preservative free) injection (10 mLs  Given 03/11/22 2214)    ED Course/ Medical Decision Making/ A&P                           Medical Decision Making Young male with history of schizophrenia, recent psych evaluation and recent cessation of Ambien, seemingly due to possible misuse, now presents after alleged episodes of aggressiveness, with auditory hallucination.  No physical exam findings that are notable, patient is hemodynamically unremarkable, has been medically cleared for behavior health evaluation.  Amount and/or Complexity of Data Reviewed Independent Historian:     Details: Police, details of HPI External Data Reviewed: notes.    Details: Behavioral health notes including alleged misuse of Ambien Labs:  Decision-making details documented in ED Course.  Risk OTC drugs. Prescription drug management. Decision regarding hospitalization.   Final Clinical Impression(s) / ED Diagnoses Final diagnoses:  Agitation     Gerhard Munch, MD 03/11/22 2345

## 2022-03-12 DIAGNOSIS — F451 Undifferentiated somatoform disorder: Secondary | ICD-10-CM

## 2022-03-12 LAB — RAPID URINE DRUG SCREEN, HOSP PERFORMED
Amphetamines: NOT DETECTED
Barbiturates: NOT DETECTED
Benzodiazepines: NOT DETECTED
Cocaine: NOT DETECTED
Opiates: NOT DETECTED
Tetrahydrocannabinol: NOT DETECTED

## 2022-03-12 LAB — RESP PANEL BY RT-PCR (FLU A&B, COVID) ARPGX2
Influenza A by PCR: NEGATIVE
Influenza B by PCR: NEGATIVE
SARS Coronavirus 2 by RT PCR: NEGATIVE

## 2022-03-12 MED ORDER — LORAZEPAM 2 MG/ML IJ SOLN
INTRAMUSCULAR | Status: AC
  Filled 2022-03-12: qty 1

## 2022-03-12 MED ORDER — OLANZAPINE 10 MG IM SOLR
5.0000 mg | Freq: Once | INTRAMUSCULAR | Status: AC | PRN
  Administered 2022-03-12: 5 mg via INTRAMUSCULAR
  Filled 2022-03-12: qty 10

## 2022-03-12 MED ORDER — OLANZAPINE 10 MG IM SOLR: 5.0000 mg | Freq: Four times a day (QID) | INTRAMUSCULAR | Status: DC | PRN

## 2022-03-12 MED ORDER — DIPHENHYDRAMINE HCL 50 MG/ML IJ SOLN
50.0000 mg | Freq: Once | INTRAMUSCULAR | Status: AC
  Administered 2022-03-12: 50 mg via INTRAMUSCULAR
  Filled 2022-03-12: qty 1

## 2022-03-12 MED ORDER — LORAZEPAM 2 MG/ML IJ SOLN
2.0000 mg | Freq: Once | INTRAMUSCULAR | Status: AC
  Administered 2022-03-12: 2 mg via INTRAMUSCULAR

## 2022-03-12 MED ORDER — STERILE WATER FOR INJECTION IJ SOLN
INTRAMUSCULAR | Status: AC
  Filled 2022-03-12: qty 10

## 2022-03-12 NOTE — ED Notes (Signed)
Pt attempting to make phone call  

## 2022-03-12 NOTE — ED Provider Notes (Signed)
Emergency Medicine Observation Re-evaluation Note  Brendan Hogan is a 26 y.o. male, seen on rounds today.  Pt initially presented to the ED for complaints of IVC Currently, the patient is pt roaming around the room and requesting to make a phone call.  Physical Exam  BP 126/87   Pulse (!) 103   Temp 97.6 F (36.4 C) (Oral)   Resp 16   Ht 5\' 5"  (1.651 m)   Wt 63.5 kg   SpO2 100%   BMI 23.30 kg/m  Physical Exam General: nad Cardiac: regular Lungs: clear Psych: walking around and having to be redirected frequently  ED Course / MDM  EKG:   I have reviewed the labs performed to date as well as medications administered while in observation.  Recent changes in the last 24 hours include refusing home meds and required IM zyprexa yesterday.  Plan  Current plan is for still waiting psych evaluation.  Under IVC at this time.    , MD 03/12/22 [phone]

## 2022-03-12 NOTE — ED Notes (Signed)
Pt lying on stretcher; calm and cooperative at present

## 2022-03-12 NOTE — ED Notes (Signed)
Pt resting with eyes closed; respirations spontaneous, even, unlabored; sitter at bedside  

## 2022-03-12 NOTE — ED Notes (Signed)
Pt eating lunch

## 2022-03-12 NOTE — ED Notes (Signed)
Pt attempting to make phone call

## 2022-03-12 NOTE — ED Notes (Addendum)
Report given to Kennyth Arnold, Charity fundraiser at [facility]

## 2022-03-12 NOTE — ED Notes (Signed)
Pt continues to walk in hall; attempted to get pt back to room; pt resisting, physically escorted back to room by security

## 2022-03-12 NOTE — ED Notes (Signed)
Pt pulled mattress off stretcher for a 3rd time today; states he wants to sleep on the floor; stretcher removed, mattress placed on floor; pt lying quietly on mattress

## 2022-03-12 NOTE — ED Notes (Addendum)
Attempted report to [facility] RN; RN requesting report not be given until transportation at bedside

## 2022-03-12 NOTE — ED Notes (Signed)
Pt uncooperative for blood draw

## 2022-03-12 NOTE — ED Notes (Signed)
Pt walking around halls with eyes closed; moving chairs, trying to take mattress off stretcher, trying to close door; pt refusing PO meds; sitter and security at bedside

## 2022-03-12 NOTE — ED Notes (Signed)
Sheriff called to tx pt patient to receiving facility unsure of the time of arrive I.v.c .paper work packet attach to the clip board in yellow zone

## 2022-03-12 NOTE — Progress Notes (Signed)
Pt was accepted to [facility] TODAY; Bed Assignment [facility]   Pt meets inpatient criteria per Eligha Bridegroom, NP    Attending Physician will be Joylene Igo, NP   Report can be called to: [phone].   Pt can arrive: BED IS READY    Care Team notified: Wendy Poet, RN, Reginia Naas, RN, Eligha Bridegroom, NP     Maryjean Ka, MSW, [facility] 03/12/2022 3:02 PM

## 2022-03-12 NOTE — ED Notes (Signed)
Pt eating

## 2022-03-12 NOTE — Consult Note (Signed)
[facility] ED ASSESSMENT   Reason for Consult:  psychosis Referring Physician:  Dr. Jeraldine Loots Patient Identification: Brendan Hogan MRN:  [id number] ED Chief Complaint: <principal problem not specified>  Diagnosis:  Active Problems:   Schizophrenia [facility])   ED Assessment Time Calculation: Start Time: 0930 Stop Time: 1000 Total Time in Minutes (Assessment Completion): 30   HPI:   Brendan Hogan is a 26 y.o. male patient brought in by [facility] after being IVC by his father for abnormal behaviors.  Patient is uncooperative with police and staff upon arrival.  He is seen to be responding to internal stimuli and licking the hospital walls.  Patient does have history of schizophrenia and, questionable Ambien misuse, and noncompliance to psychotropic medications.   Subjective:   Patient seen at [name], ED for face-to-face evaluation.  Upon assessment patient is walking the halls, touching walls/doors, having to constantly be redirected by security back to his room.  Patient is minimally engaged with assessment.  He was slow to respond, spoke in a very low tone, mumbling/garbled speech, and appeared confused/disoriented.  He was noted to be looking around the room, or fixated on a certain spot on the wall.  When asked about any suicidal or homicidal ideations he shook his head indicating no.  He also shook his head indicating no when asked about auditory or visual hallucinations, however he does appear to be preoccupied. He denies drug use stating "never". When asked about indulging in his Ambien prescription he stated "never." When asked if he is compliant with psychotropic medications he stated "never." He is seen mumbling or talking to himself at times.   I spoke to his father who is also his legal guardian, Krista Som, at [phone].  He confirms the patient has been abusing his Ambien prescription.  He stated he went through a 45 pill bottle in 10 days.  He has not been  compliant with psychotropic medications.  The past couple days his bizarre behaviors have been escalating and he has been more aggressive at home which is why they IVC to him to the hospital.  I did explain he will be recommended for inpatient psychiatric treatment and he is agreeable with this plan. Father mentions when the patient is compliant he is much better and feels like his medication regimen was working well. He is unsure if the patient is misusing any other substances.   ED staff is currently working to obtain UDS, patient has not been compliant with blood or other laboratory work. Will continue at home psychotropic medication regimen. Will not be continuing Ambien due to misuse at home. Will recommend for IP treatment at this time.   Past Psychiatric History:  Substance abuse, schizophrenia  Risk to Self or Others: Is the patient at risk to self? Yes Has the patient been a risk to self in the past 6 months? No Has the patient been a risk to self within the distant past? No Is the patient a risk to others? Yes Has the patient been a risk to others in the past 6 months? No Has the patient been a risk to others within the distant past? No  [place] Scale:  Flowsheet Row ED from 03/11/2022 in [facility] EMERGENCY DEPARTMENT ED from 02/19/2022 in [facility] Office Visit from 02/18/2022 in [facility]  C-SSRS RISK CATEGORY Error: Q3, 4, or 5 should not be populated when Q2 is No Error: Question 6 not populated  No Risk      Past Medical History:  Past Medical History:  Diagnosis Date   Psychosis (HCC) 05/2019   [facility]    Seasonal allergies     Past Surgical History:  Procedure Laterality Date   TONSILLECTOMY     Family History:  Family History  Problem Relation Age of Onset   Hypertension Mother    Social History:  Social History   Substance and Sexual Activity  Alcohol Use  Not Currently   Comment: rare     Social History   Substance and Sexual Activity  Drug Use No    Social History   Socioeconomic History   Marital status: Single    Spouse name: Not on file   Number of children: Not on file   Years of education: Not on file   Highest education level: Not on file  Occupational History   Not on file  Tobacco Use   Smoking status: Every Day    Packs/day: 1.00    Types: Cigarettes   Smokeless tobacco: Former  Building services engineer Use: Never used  Substance and Sexual Activity   Alcohol use: Not Currently    Comment: rare   Drug use: No   Sexual activity: Not Currently  Other Topics Concern   Not on file  Social History Narrative   Not on file   Social Determinants of Health   Financial Resource Strain: Not on file  Food Insecurity: No Food Insecurity (01/20/2022)   Hunger Vital Sign    Worried About Running Out of Food in the Last Year: Never true    Ran Out of Food in the Last Year: Never true  Transportation Needs: No Transportation Needs (01/20/2022)   PRAPARE - Administrator, Civil Service (Medical): No    Lack of Transportation (Non-Medical): No  Physical Activity: Not on file  Stress: Not on file  Social Connections: Not on file   Additional Social History:    Allergies:   Allergies  Allergen Reactions   Haldol [Haloperidol] Other (See Comments)    Muscle spasms- involuntary muscle contractions   Geodon [Ziprasidone] Other (See Comments)    Patient reports that this medicine made him feel bad. He has difficult describing symptoms. He requests not to take anymore.     Labs:  Results for orders placed or performed during the hospital encounter of 03/11/22 (from the past 48 hour(s))  CBC with Differential     Status: None   Collection Time: 03/11/22  8:09 PM  Result Value Ref Range   WBC 4.6 4.0 - 10.5 K/uL   RBC 5.27 4.22 - 5.81 MIL/uL   Hemoglobin 15.3 13.0 - 17.0 g/dL   HCT 37.8 58.8 - 50.2 %   MCV 86.1  80.0 - 100.0 fL   MCH 29.0 26.0 - 34.0 pg   MCHC 33.7 30.0 - 36.0 g/dL   RDW 77.4 12.8 - 78.6 %   Platelets 259 150 - 400 K/uL   nRBC 0.0 0.0 - 0.2 %   Neutrophils Relative % 52 %   Neutro Abs 2.4 1.7 - 7.7 K/uL   Lymphocytes Relative 32 %   Lymphs Abs 1.5 0.7 - 4.0 K/uL   Monocytes Relative 14 %   Monocytes Absolute 0.7 0.1 - 1.0 K/uL   Eosinophils Relative 1 %   Eosinophils Absolute 0.1 0.0 - 0.5 K/uL   Basophils Relative 1 %   Basophils Absolute 0.1 0.0 - 0.1  K/uL   Immature Granulocytes 0 %   Abs Immature Granulocytes 0.01 0.00 - 0.07 K/uL    Comment: Performed at [facility], [address] [place], [place] [address]  Comprehensive metabolic panel     Status: Abnormal   Collection Time: 03/11/22  8:09 PM  Result Value Ref Range   Sodium 141 135 - 145 mmol/L   Potassium 3.3 (L) 3.5 - 5.1 mmol/L   Chloride 103 98 - 111 mmol/L   CO2 23 22 - 32 mmol/L   Glucose, Bld 99 70 - 99 mg/dL    Comment: Glucose reference range applies only to samples taken after fasting for at least 8 hours.   BUN 9 6 - 20 mg/dL   Creatinine, Ser 3.41 0.61 - 1.24 mg/dL   Calcium 93.7 8.9 - 90.2 mg/dL   Total Protein 7.7 6.5 - 8.1 g/dL   Albumin 4.8 3.5 - 5.0 g/dL   AST 40 15 - 41 U/L   ALT 19 0 - 44 U/L   Alkaline Phosphatase 90 38 - 126 U/L   Total Bilirubin 0.8 0.3 - 1.2 mg/dL   GFR, Estimated >40 >97 mL/min    Comment: (NOTE) Calculated using the CKD-EPI Creatinine Equation (2021)    Anion gap 15 5 - 15    Comment: Performed at [facility], [address], [place], [place] [address]  Ethanol     Status: None   Collection Time: 03/11/22  8:09 PM  Result Value Ref Range   Alcohol, Ethyl (B) <10 <10 mg/dL    Comment: (NOTE) Lowest detectable limit for serum alcohol is 10 mg/dL.  For medical purposes only. Performed at [facility], [address], [place], [place] [address]     Current Facility-Administered Medications  Medication Dose Route Frequency Provider  Last Rate Last Admin   benztropine (COGENTIN) tablet 1 mg  1 mg Oral BID Gerhard Munch, MD       cloNIDine (CATAPRES) tablet 0.1 mg  0.1 mg Oral BID Gerhard Munch, MD       divalproex (DEPAKOTE) DR tablet 750 mg  750 mg Oral Q12H Gerhard Munch, MD       fluPHENAZine (PROLIXIN) tablet 10 mg  10 mg Oral Q12H Gerhard Munch, MD       nicotine polacrilex (NICORETTE) gum 2 mg  2 mg Oral PRN Gerhard Munch, MD       OLANZapine [facility]) injection 5 mg  5 mg Intramuscular Q6H PRN Eligha Bridegroom, NP       traZODone (DESYREL) tablet 50 mg  50 mg Oral QHS Gerhard Munch, MD       Current Outpatient Medications  Medication Sig Dispense Refill   benztropine (COGENTIN) 1 MG tablet Take 1 tablet (1 mg total) by mouth 2 (two) times daily. 60 tablet 3   cloNIDine (CATAPRES) 0.1 MG tablet Take 1 tablet (0.1 mg total) by mouth 2 (two) times daily. 60 tablet 3   divalproex (DEPAKOTE) 250 MG DR tablet Take 3 tablets (750 mg total) by mouth every 12 (twelve) hours. 180 tablet 3   fluPHENAZine decanoate (PROLIXIN) 25 MG/ML injection Inject 1 mL (25 mg total) into the muscle every 21 ( twenty-one) days. NEXT ADMINISTRATION IS DUE ON 02-25-2022. 5 mL 11   nicotine polacrilex (NICORETTE) 2 MG gum Take 1 each (2 mg total) by mouth as needed for smoking cessation. 100 tablet 3   traZODone (DESYREL) 50 MG tablet Take 1 tablet (50 mg total) by  mouth at bedtime. 30 tablet 3   fluPHENAZine (PROLIXIN) 10 MG tablet Take 1 tablet (10 mg total) by mouth every 12 (twelve) hours. (Patient not taking: Reported on 03/11/2022) 5 tablet 0   Psychiatric Specialty Exam: Presentation  General Appearance:  Disheveled  Eye Contact: Minimal  Speech: Garbled  Speech Volume: Decreased  Handedness: Right   Mood and Affect  Mood: Labile  Affect: Blunt   Thought Process  Thought Processes: Disorganized  Descriptions of Associations:Tangential  Orientation:Partial  Thought Content:Illogical;  Tangential  History of Schizophrenia/Schizoaffective disorder:Yes  Duration of Psychotic Symptoms:Greater than six months  Hallucinations:Hallucinations: None  Ideas of Reference:None  Suicidal Thoughts:Suicidal Thoughts: No  Homicidal Thoughts:Homicidal Thoughts: No   Sensorium  Memory: Immediate Poor; Recent Poor  Judgment: Impaired  Insight: Lacking   Executive Functions  Concentration: Poor  Attention Span: Poor  Recall: Poor  Fund of Knowledge: Poor  Language: Poor   Psychomotor Activity  Psychomotor Activity: Psychomotor Activity: Restlessness   Assets  Assets: Physical Health; Resilience; Social Support    Sleep  Sleep: Sleep: Fair   Physical Exam: Physical Exam Neurological:     Mental Status: He is alert. He is disoriented.  Psychiatric:        Attention and Perception: He is inattentive.        Mood and Affect: Affect is blunt.        Speech: Speech is tangential.        Behavior: Behavior is uncooperative.        Thought Content: Thought content normal.        Judgment: Judgment is impulsive.    Review of Systems  Psychiatric/Behavioral:  Positive for hallucinations and substance abuse. The patient has insomnia.   All other systems reviewed and are negative.  Blood pressure 126/87, pulse (!) 103, temperature 97.6 F (36.4 C), temperature source Oral, resp. rate 16, height 5\' 5"  (1.651 m), weight 63.5 kg, SpO2 100 %. Body mass index is 23.3 kg/m.  Medical Decision Making: Patient case reviewed and discussed with Dr. Lucianne MussKumar.  Patient does meet criteria for IVC and inpatient psychiatric treatment.  [facility], CSW notified to fax out patient.  EKG requested due to last result being in September of 2023. UDS still pending.   -continue home medications  Disposition: Recommend psychiatric Inpatient admission when medically cleared.  Eligha BridegroomMikaela  Tramya Schoenfelder, NP 03/12/2022 9:53 AM

## 2022-03-12 NOTE — ED Notes (Addendum)
Sheriff at bedside; belongings given to [facility]

## 2022-03-12 NOTE — ED Notes (Signed)
Pt's father at bedside

## 2022-03-12 NOTE — ED Notes (Addendum)
Pt walking in halls, aggressing towards staff, punch and kicking; psychiatry notified; security and [name] at bedside

## 2022-03-12 NOTE — Progress Notes (Signed)
Inpatient Behavioral Health Placement  Pt meets inpatient criteria per Eligha Bridegroom, NP. There are no available beds at [facility] per [facility] Rona Ravens, RN.  Referral was sent to the following facilities;   Destination  Service Provider Address Phone Fax  [facility] [address] [place] [address] [phone]  [facility]  [address], [place] [address] [phone]  [facility]  [address] [place] [address] [phone]  [facility]  [address] [place], [place] [address] [phone]  [facility]  [address] [place] [address] [phone]  [facility]  [address] [place] [address] [phone]  [facility]  [address] [place] [address] [phone]  [facility]  [address] [place] [address] [phone]  [facility]  [address] [place], [place] [address] [phone]  [facility]  [address], [place] [address] [phone]  [facility]  [address], [place] [address] [phone]  [facility]  [address], [place] [address] [phone]  [facility]  [address] [place] [address] [phone]  [facility]  [address] [place] [address] [phone]  [facility]  [address], [place] [address] [phone]  [facility]  [address], [place] [address] [phone]  [facility]  [address]  [place], [place] [address] [phone]  [facility]  [address] [place] [address] [phone]  [facility]  [address] [place] [address] [phone]  [facility]  [address] [place] [address] [phone]  [facility]  [address], [place] [address] [phone]    Situation ongoing,  CSW will follow up.   Maryjean Ka, MSW, LCSWA 03/12/2022  @ 12:37 PM

## 2022-03-12 NOTE — Progress Notes (Signed)
Pt was accepted to [facility] 03/13/22; Main Campus  Pt meets inpatient criteria per Eligha Bridegroom, NP   Attending Physician will be Dr. Estill Cotta  Report can be called to: [phone] *Pager  Pt can arrive after 9:00am  Care Team notified: Wendy Poet, RN, Reginia Naas, RN, Eligha Bridegroom, NP  Kelton Pillar, LCSWA 03/12/2022 @ 12:54 PM

## 2022-03-12 NOTE — ED Notes (Signed)
Pt watching TV

## 2022-03-12 NOTE — ED Notes (Signed)
Psychiatry at bedside.

## 2022-04-08 ENCOUNTER — Encounter (HOSPITAL_COMMUNITY): Payer: Self-pay

## 2022-04-08 ENCOUNTER — Emergency Department (HOSPITAL_COMMUNITY)
Admission: EM | Admit: 2022-04-08 | Discharge: 2022-04-08 | Discharge status: Against Medical Advice | Payer: Commercial Managed Care - HMO | Attending: Emergency Medicine | Admitting: Emergency Medicine

## 2022-04-08 ENCOUNTER — Other Ambulatory Visit: Payer: Self-pay

## 2022-04-08 DIAGNOSIS — J029 Acute pharyngitis, unspecified: Secondary | ICD-10-CM | POA: Insufficient documentation

## 2022-04-08 DIAGNOSIS — F419 Anxiety disorder, unspecified: Secondary | ICD-10-CM | POA: Insufficient documentation

## 2022-04-08 DIAGNOSIS — J3489 Other specified disorders of nose and nasal sinuses: Secondary | ICD-10-CM | POA: Insufficient documentation

## 2022-04-08 DIAGNOSIS — R0981 Nasal congestion: Secondary | ICD-10-CM | POA: Diagnosis not present

## 2022-04-08 DIAGNOSIS — Z5321 Procedure and treatment not carried out due to patient leaving prior to being seen by health care provider: Secondary | ICD-10-CM | POA: Diagnosis not present

## 2022-04-08 DIAGNOSIS — Z1152 Encounter for screening for COVID-19: Secondary | ICD-10-CM | POA: Diagnosis not present

## 2022-04-08 LAB — GROUP A STREP BY PCR: Group A Strep by PCR: NOT DETECTED

## 2022-04-08 LAB — RESP PANEL BY RT-PCR (RSV, FLU A&B, COVID)  RVPGX2
Influenza A by PCR: NEGATIVE
Influenza B by PCR: NEGATIVE
Resp Syncytial Virus by PCR: NEGATIVE
SARS Coronavirus 2 by RT PCR: NEGATIVE

## 2022-04-08 NOTE — ED Triage Notes (Addendum)
Pt arrives to ED with father with complaints of cold symptoms since yesterday.   Pt endorse receiving a Prolixin injection last week. Father is concerned these symptoms are related to the injection. Father also endorses the patient has been sneezing a lot more.

## 2022-04-08 NOTE — ED Provider Triage Note (Signed)
Emergency Medicine Provider Triage Evaluation Note  Brendan Hogan , a 26 y.o. male  was evaluated in triage.  Pt complains of flulike symptoms.  Developed within the last 24 to 48 hours.  These include runny nose, congestion, sore throat, and excessive sneezing.  Denies cough, chest tightness, shortness of breath, or history of asthma or lung disease.  Denies fevers or chills.  Denies N/V/D or dysphagia.  No known recent sick contacts.  Received an IM shot of an antipsychotic medication 1 week ago today, for which the patient and his father are curious whether this may be contributing to his symptoms today.  Also endorses chronic anxiety, states today's anxiety does not have any new features in comparison to his chronic/baseline.  Is more concerned of his flu-like symptoms.  Denies SI/HI/AVH.  Review of Systems  Positive:  Negative: See above  Physical Exam  BP 99/74 (BP Location: Right Arm)   Pulse 62   Temp (!) 97.3 F (36.3 C) (Oral)   Resp 18   SpO2 100%  Gen:   Awake, no distress   Resp:  Normal effort  MSK:   Moves extremities without difficulty  Other:  Airway patent.  Uvula not deviated or swollen.  No swollen or erythematous tonsils appreciated, and without exudate.  Medical Decision Making  Medically screening exam initiated at 4:51 PM.  Appropriate orders placed.  Brendan Hogan was informed that the remainder of the evaluation will be completed by another provider, this initial triage assessment does not replace that evaluation, and the importance of remaining in the ED until their evaluation is complete.     Brendan Cobbs, PA-C 04/08/22 1706

## 2022-04-08 NOTE — ED Notes (Incomplete)
The patient's

## 2022-04-08 NOTE — ED Notes (Signed)
Pt notified staff that they were leaving.  

## 2022-04-15 ENCOUNTER — Emergency Department (HOSPITAL_COMMUNITY)
Admission: EM | Admit: 2022-04-15 | Discharge: 2022-04-16 | Disposition: A | Discharge status: Home / Self Care | Payer: Commercial Managed Care - HMO | Attending: Emergency Medicine | Admitting: Emergency Medicine

## 2022-04-15 ENCOUNTER — Emergency Department (HOSPITAL_COMMUNITY): Payer: Commercial Managed Care - HMO

## 2022-04-15 DIAGNOSIS — Z20822 Contact with and (suspected) exposure to covid-19: Secondary | ICD-10-CM | POA: Insufficient documentation

## 2022-04-15 DIAGNOSIS — Z5321 Procedure and treatment not carried out due to patient leaving prior to being seen by health care provider: Secondary | ICD-10-CM | POA: Diagnosis not present

## 2022-04-15 DIAGNOSIS — R509 Fever, unspecified: Secondary | ICD-10-CM | POA: Diagnosis not present

## 2022-04-15 DIAGNOSIS — R059 Cough, unspecified: Secondary | ICD-10-CM | POA: Diagnosis present

## 2022-04-15 LAB — RESP PANEL BY RT-PCR (RSV, FLU A&B, COVID)  RVPGX2
Influenza A by PCR: POSITIVE — AB
Influenza B by PCR: NEGATIVE
Resp Syncytial Virus by PCR: NEGATIVE
SARS Coronavirus 2 by RT PCR: NEGATIVE

## 2022-04-15 MED ORDER — ONDANSETRON 4 MG PO TBDP
4.0000 mg | ORAL_TABLET | Freq: Once | ORAL | Status: AC
  Administered 2022-04-15: 4 mg via ORAL
  Filled 2022-04-15: qty 1

## 2022-04-15 NOTE — ED Triage Notes (Addendum)
Pt to ED with parents c/o cold symptoms, came to ED one week ago for the same. C/o cough, fever, sick contacts in home

## 2022-04-15 NOTE — ED Provider Triage Note (Signed)
Emergency Medicine Provider Triage Evaluation Note  Brendan Hogan , a 26 y.o. male  was evaluated in triage.  Pt complains of patient reports several days of severe cough, nausea, vomiting.  He reports that he has multiple sick contacts at home.  Recently tested for respiratory viral panel with negative findings denies experiencing fever, abdominal pain, diarrhea.  Patient is concerned that he could potentially be dehydrated due to the number of episodes of emesis he experienced today..  Review of Systems  Positive: As above Negative: As above  Physical Exam  BP 115/71   Pulse 97   Temp 99.1 F (37.3 C) (Oral)   Resp 18   Ht 5\' 8"  (1.727 m)   Wt 65.8 kg   SpO2 100%   BMI 22.05 kg/m  Gen:   Awake, no distress, gagging without emesis Resp:  Normal effort no obvious wheezing or crackles MSK:   Moves extremities without difficulty Other:  No obvious abdominal tenderness  Medical Decision Making  Medically screening exam initiated at 8:48 PM.  Appropriate orders placed.  SONNIE PAWLOSKI was informed that the remainder of the evaluation will be completed by another provider, this initial triage assessment does not replace that evaluation, and the importance of remaining in the ED until their evaluation is complete.     [name], PA-C 04/15/22 2049

## 2022-04-19 ENCOUNTER — Other Ambulatory Visit (HOSPITAL_COMMUNITY): Payer: Self-pay | Admitting: Psychiatry

## 2022-04-19 DIAGNOSIS — F5101 Primary insomnia: Secondary | ICD-10-CM

## 2022-05-01 ENCOUNTER — Inpatient Hospital Stay (HOSPITAL_COMMUNITY)
Admission: AD | Admit: 2022-05-01 | Discharge: 2022-05-10 | DRG: 885 | Disposition: A | Discharge status: Home / Self Care | Payer: Federal, State, Local not specified - Other | Source: Intra-hospital | Attending: Psychiatry | Admitting: Psychiatry

## 2022-05-01 ENCOUNTER — Encounter (HOSPITAL_COMMUNITY): Payer: Self-pay

## 2022-05-01 ENCOUNTER — Emergency Department (HOSPITAL_COMMUNITY)
Admission: EM | Admit: 2022-05-01 | Discharge: 2022-05-01 | Disposition: A | Discharge status: Other Acute Inpatient Hospital | Payer: Federal, State, Local not specified - Other | Attending: Emergency Medicine | Admitting: Emergency Medicine

## 2022-05-01 ENCOUNTER — Encounter (HOSPITAL_COMMUNITY): Payer: Self-pay | Admitting: Psychiatry

## 2022-05-01 ENCOUNTER — Other Ambulatory Visit: Payer: Self-pay

## 2022-05-01 DIAGNOSIS — F201 Disorganized schizophrenia: Secondary | ICD-10-CM | POA: Diagnosis present

## 2022-05-01 DIAGNOSIS — Z91199 Patient's noncompliance with other medical treatment and regimen due to unspecified reason: Secondary | ICD-10-CM

## 2022-05-01 DIAGNOSIS — Z1152 Encounter for screening for COVID-19: Secondary | ICD-10-CM | POA: Insufficient documentation

## 2022-05-01 DIAGNOSIS — F209 Schizophrenia, unspecified: Secondary | ICD-10-CM | POA: Diagnosis not present

## 2022-05-01 DIAGNOSIS — F17210 Nicotine dependence, cigarettes, uncomplicated: Secondary | ICD-10-CM | POA: Insufficient documentation

## 2022-05-01 DIAGNOSIS — Z9151 Personal history of suicidal behavior: Secondary | ICD-10-CM | POA: Diagnosis not present

## 2022-05-01 DIAGNOSIS — G4700 Insomnia, unspecified: Secondary | ICD-10-CM | POA: Diagnosis present

## 2022-05-01 DIAGNOSIS — F200 Paranoid schizophrenia: Principal | ICD-10-CM | POA: Diagnosis present

## 2022-05-01 DIAGNOSIS — F17200 Nicotine dependence, unspecified, uncomplicated: Secondary | ICD-10-CM

## 2022-05-01 DIAGNOSIS — F5101 Primary insomnia: Secondary | ICD-10-CM

## 2022-05-01 DIAGNOSIS — F29 Unspecified psychosis not due to a substance or known physiological condition: Secondary | ICD-10-CM

## 2022-05-01 LAB — RAPID URINE DRUG SCREEN, HOSP PERFORMED
Amphetamines: NOT DETECTED
Barbiturates: NOT DETECTED
Benzodiazepines: NOT DETECTED
Cocaine: NOT DETECTED
Opiates: NOT DETECTED
Tetrahydrocannabinol: POSITIVE — AB

## 2022-05-01 LAB — CBC
HCT: 47.5 % (ref 39.0–52.0)
Hemoglobin: 15.9 g/dL (ref 13.0–17.0)
MCH: 28.5 pg (ref 26.0–34.0)
MCHC: 33.5 g/dL (ref 30.0–36.0)
MCV: 85.3 fL (ref 80.0–100.0)
Platelets: 281 10*3/uL (ref 150–400)
RBC: 5.57 MIL/uL (ref 4.22–5.81)
RDW: 12.3 % (ref 11.5–15.5)
WBC: 6.9 10*3/uL (ref 4.0–10.5)
nRBC: 0 % (ref 0.0–0.2)

## 2022-05-01 LAB — RESP PANEL BY RT-PCR (RSV, FLU A&B, COVID)  RVPGX2
Influenza A by PCR: NEGATIVE
Influenza B by PCR: NEGATIVE
Resp Syncytial Virus by PCR: NEGATIVE
SARS Coronavirus 2 by RT PCR: NEGATIVE

## 2022-05-01 LAB — COMPREHENSIVE METABOLIC PANEL
ALT: 14 U/L (ref 0–44)
AST: 20 U/L (ref 15–41)
Albumin: 4.7 g/dL (ref 3.5–5.0)
Alkaline Phosphatase: 81 U/L (ref 38–126)
Anion gap: 12 (ref 5–15)
BUN: 11 mg/dL (ref 6–20)
CO2: 26 mmol/L (ref 22–32)
Calcium: 9.6 mg/dL (ref 8.9–10.3)
Chloride: 98 mmol/L (ref 98–111)
Creatinine, Ser: 0.97 mg/dL (ref 0.61–1.24)
GFR, Estimated: >60 mL/min (ref >60)
Glucose, Bld: 126 mg/dL — ABNORMAL HIGH (ref 70–99)
Potassium: 3.3 mmol/L — ABNORMAL LOW (ref 3.5–5.1)
Sodium: 136 mmol/L (ref 135–145)
Total Bilirubin: 0.8 mg/dL (ref 0.3–1.2)
Total Protein: 7.9 g/dL (ref 6.5–8.1)

## 2022-05-01 LAB — VALPROIC ACID LEVEL: Valproic Acid Lvl: <10 ug/mL — ABNORMAL LOW (ref 50.0–100.0)

## 2022-05-01 LAB — ETHANOL: Alcohol, Ethyl (B): <10 mg/dL (ref <10)

## 2022-05-01 MED ORDER — OLANZAPINE 10 MG IM SOLR: 10.0000 mg | Freq: Two times a day (BID) | INTRAMUSCULAR | Status: DC | PRN

## 2022-05-01 MED ORDER — HYDROXYZINE HCL 25 MG PO TABS
25.0000 mg | ORAL_TABLET | Freq: Three times a day (TID) | ORAL | Status: DC | PRN
  Administered 2022-05-01 – 2022-05-09 (×7): 25 mg via ORAL
  Filled 2022-05-01: qty 1
  Filled 2022-05-01: qty 10
  Filled 2022-05-01 (×6): qty 1

## 2022-05-01 MED ORDER — LORAZEPAM 1 MG PO TABS
1.0000 mg | ORAL_TABLET | Freq: Once | ORAL | Status: AC | PRN
  Administered 2022-05-06: 1 mg via ORAL
  Filled 2022-05-01: qty 1

## 2022-05-01 MED ORDER — POTASSIUM CHLORIDE CRYS ER 20 MEQ PO TBCR
20.0000 meq | EXTENDED_RELEASE_TABLET | Freq: Once | ORAL | Status: AC
  Administered 2022-05-01: 20 meq via ORAL
  Filled 2022-05-01: qty 1

## 2022-05-01 MED ORDER — TRAZODONE HCL 50 MG PO TABS
50.0000 mg | ORAL_TABLET | Freq: Every evening | ORAL | Status: DC | PRN
  Administered 2022-05-01 – 2022-05-08 (×5): 50 mg via ORAL
  Filled 2022-05-01 (×5): qty 1

## 2022-05-01 MED ORDER — BENZTROPINE MESYLATE 1 MG PO TABS
1.0000 mg | ORAL_TABLET | Freq: Two times a day (BID) | ORAL | Status: DC
  Administered 2022-05-01 – 2022-05-10 (×18): 1 mg via ORAL
  Filled 2022-05-01 (×7): qty 1
  Filled 2022-05-01: qty 20
  Filled 2022-05-01 (×7): qty 1
  Filled 2022-05-01: qty 20
  Filled 2022-05-01 (×9): qty 1

## 2022-05-01 MED ORDER — OLANZAPINE 10 MG PO TBDP
10.0000 mg | ORAL_TABLET | Freq: Once | ORAL | Status: DC | PRN
  Filled 2022-05-01: qty 1

## 2022-05-01 MED ORDER — LORAZEPAM 1 MG PO TABS
1.0000 mg | ORAL_TABLET | Freq: Once | ORAL | Status: AC
  Administered 2022-05-01: 1 mg via ORAL
  Filled 2022-05-01: qty 1

## 2022-05-01 MED ORDER — ZOLPIDEM TARTRATE 5 MG PO TABS: 5.0000 mg | ORAL_TABLET | Freq: Every evening | ORAL | Status: DC | PRN

## 2022-05-01 MED ORDER — NICOTINE POLACRILEX 2 MG MT GUM
2.0000 mg | CHEWING_GUM | OROMUCOSAL | Status: DC | PRN
  Administered 2022-05-02 – 2022-05-10 (×14): 2 mg via ORAL
  Filled 2022-05-01 (×8): qty 1

## 2022-05-01 MED ORDER — ONDANSETRON HCL 4 MG PO TABS: 4.0000 mg | ORAL_TABLET | Freq: Three times a day (TID) | ORAL | Status: DC | PRN

## 2022-05-01 MED ORDER — IBUPROFEN 200 MG PO TABS: 600.0000 mg | ORAL_TABLET | Freq: Three times a day (TID) | ORAL | Status: DC | PRN

## 2022-05-01 MED ORDER — DIVALPROEX SODIUM 500 MG PO DR TAB
750.0000 mg | DELAYED_RELEASE_TABLET | Freq: Two times a day (BID) | ORAL | Status: DC
  Administered 2022-05-01 – 2022-05-07 (×12): 750 mg via ORAL
  Filled 2022-05-01 (×15): qty 1

## 2022-05-01 MED ORDER — ALUM & MAG HYDROXIDE-SIMETH 200-200-20 MG/5ML PO SUSP: 30.0000 mL | Freq: Four times a day (QID) | ORAL | Status: DC | PRN

## 2022-05-01 MED ORDER — ACETAMINOPHEN 325 MG PO TABS: 650.0000 mg | ORAL_TABLET | Freq: Four times a day (QID) | ORAL | Status: DC | PRN

## 2022-05-01 MED ORDER — MAGNESIUM HYDROXIDE 400 MG/5ML PO SUSP: 30.0000 mL | Freq: Every day | ORAL | Status: DC | PRN

## 2022-05-01 MED ORDER — OLANZAPINE 10 MG PO TBDP
10.0000 mg | ORAL_TABLET | Freq: Once | ORAL | Status: DC
  Filled 2022-05-01: qty 1

## 2022-05-01 MED ORDER — OLANZAPINE 10 MG IM SOLR
10.0000 mg | Freq: Once | INTRAMUSCULAR | Status: AC
  Administered 2022-05-01: 10 mg via INTRAMUSCULAR
  Filled 2022-05-01: qty 10

## 2022-05-01 NOTE — ED Notes (Signed)
Pt used phone for last phone call of the day.

## 2022-05-01 NOTE — ED Notes (Signed)
Pt is sleeping after eating

## 2022-05-01 NOTE — ED Notes (Signed)
Pt attempted to walk out of ambulance bay.

## 2022-05-01 NOTE — Tx Team (Signed)
Initial Treatment Plan 05/01/2022 3:24 PM Brendan Hogan GYI:[id number]    PATIENT STRESSORS: Marital or family conflict   Medication change or noncompliance     PATIENT STRENGTHS: Communication skills  Supportive family/friends    PATIENT IDENTIFIED PROBLEMS: Medication noncompliance  Psychosis  Bizarre behaviors   Ineffective coping skills               DISCHARGE CRITERIA:  Ability to meet basic life and health needs Safe-care adequate arrangements made  PRELIMINARY DISCHARGE PLAN: Attend aftercare/continuing care group Outpatient therapy Return to previous living arrangement  PATIENT/FAMILY INVOLVEMENT: This treatment plan has been presented to and reviewed with the patient, Brendan Hogan, and/or family member.  The patient and family have been given the opportunity to ask questions and make suggestions.  Vela Prose, RN 05/01/2022, 3:24 PM

## 2022-05-01 NOTE — Progress Notes (Addendum)
Pt was accepted to [facility] 05/01/2022, pending IVC paperwork faxed to [phone]. Bed assignment: 501-1  Pt meets inpatient criteria per Michaele Offer, [place]  Attending Physician will be Janine Limbo, MD  Report can be called to: Adult unit: [phone]  Pt can arrive after 2 PM  Care Team Notified: [place], [place], [facility] Scharlene Gloss, RN, Lynnda Shields, RN, and Terri Piedra, RN  [place], [place]  05/01/2022 1:55 PM

## 2022-05-01 NOTE — ED Provider Notes (Signed)
[facility] DEPT Provider Note   CSN: [id number] Arrival date & time: 05/01/22  0201     History  Chief Complaint  Patient presents with   [place] is a 27 y.o. male.  The history is provided by the patient, a parent and medical records.  Mental Health Problem COLSEN MODI is a 27 y.o. male who presents to the Emergency Department complaining of behavior change.  Level 5 caveat due to psychiatric disease.  History is provided by the patient and his parents at the bedside.  Parents state that he has not been the same since receiving IM injections for his schizophrenia.  The started about 1 month ago and he receives it every 2 weeks.  He also reports that he is taking excessive amounts of Ambien.  Father states that this was prescribed for poor sleep but the patient has a habit of taking it out of his hands and taking multiple pills at a time.  After he takes these medications he attempts to leave the house and father is concerned that the patient will get into trouble and harm himself.  Patient is unwilling to answer any review of systems questions or state what medications he may or may not have taken.  He does not answer any questions regarding SI or HI.    Home Medications Prior to Admission medications   Medication Sig Start Date End Date Taking? Authorizing Provider  benztropine (COGENTIN) 1 MG tablet Take 1 tablet (1 mg total) by mouth 2 (two) times daily. 02/18/22   Salley Slaughter, NP  cloNIDine (CATAPRES) 0.1 MG tablet Take 1 tablet (0.1 mg total) by mouth 2 (two) times daily. 02/18/22   Salley Slaughter, NP  divalproex (DEPAKOTE) 250 MG DR tablet Take 3 tablets (750 mg total) by mouth every 12 (twelve) hours. 02/18/22   Salley Slaughter, NP  fluPHENAZine (PROLIXIN) 10 MG tablet Take 1 tablet (10 mg total) by mouth every 12 (twelve) hours. Patient not taking: Reported on 03/11/2022 02/08/22   Janine Limbo, MD  fluPHENAZine decanoate (PROLIXIN) 25 MG/ML injection Inject 1 mL (25 mg total) into the muscle every 21 ( twenty-one) days. NEXT ADMINISTRATION IS DUE ON 02-25-2022. 02/25/22   Eulis Canner E, NP  nicotine polacrilex (NICORETTE) 2 MG gum Take 1 each (2 mg total) by mouth as needed for smoking cessation. 02/18/22   Salley Slaughter, NP  traZODone (DESYREL) 50 MG tablet Take 1 tablet (50 mg total) by mouth at bedtime. 03/06/22   Salley Slaughter, NP      Allergies    Haldol [haloperidol] and Geodon [ziprasidone]    Review of Systems   Review of Systems  All other systems reviewed and are negative.   Physical Exam Updated Vital Signs BP 126/81 (BP Location: Right Arm)   Pulse (!) 104   Temp 98 F (36.7 C) (Oral)   Resp 18   Ht 5\' 8"  (1.727 m)   Wt 65.8 kg   SpO2 100%   BMI 22.05 kg/m  Physical Exam Vitals and nursing note reviewed.  Constitutional:      Appearance: He is well-developed.  HENT:     Head: Normocephalic and atraumatic.  Cardiovascular:     Rate and Rhythm: Normal rate and regular rhythm.  Pulmonary:     Effort: Pulmonary effort is normal. No respiratory distress.  Abdominal:     Palpations: Abdomen is soft.  Tenderness: There is no abdominal tenderness. There is no guarding or rebound.  Musculoskeletal:        General: No tenderness.  Skin:    General: Skin is warm and dry.  Neurological:     Mental Status: He is alert and oriented to person, place, and time.     Comments: No asymmetry of facial movement.  Moves all extremities symmetrically and strongly.  Psychiatric:     Comments: Appears to be responding to internal stimuli.  Frequently attempts to leave the room and requires redirection.  Will not answer questions regarding SI, HI or hallucinations.     ED Results / Procedures / Treatments   Labs (all labs ordered are listed, but only abnormal results are displayed) Labs Reviewed  COMPREHENSIVE METABOLIC PANEL - Abnormal;  Notable for the following components:      Result Value   Potassium 3.3 (*)    Glucose, Bld 126 (*)    All other components within normal limits  VALPROIC ACID LEVEL - Abnormal; Notable for the following components:   Valproic Acid Lvl <10 (*)    All other components within normal limits  RESP PANEL BY RT-PCR (RSV, FLU A&B, COVID)  RVPGX2  ETHANOL  CBC  RAPID URINE DRUG SCREEN, HOSP PERFORMED    EKG None  Radiology No results found.  Procedures Procedures    Medications Ordered in ED Medications  OLANZapine zydis (ZYPREXA) disintegrating tablet 10 mg (10 mg Oral Patient Refused/Not Given 05/01/22 0337)  ibuprofen (ADVIL) tablet 600 mg (has no administration in time range)  ondansetron (ZOFRAN) tablet 4 mg (has no administration in time range)  alum & mag hydroxide-simeth (MAALOX/MYLANTA) 200-200-20 MG/5ML suspension 30 mL (has no administration in time range)  zolpidem (AMBIEN) tablet 5 mg (has no administration in time range)  LORazepam (ATIVAN) tablet 1 mg (1 mg Oral Given 05/01/22 0337)  OLANZapine (ZYPREXA) injection 10 mg (10 mg Intramuscular Given 05/01/22 0416)    ED Course/ Medical Decision Making/ A&P                           Medical Decision Making Amount and/or Complexity of Data Reviewed Labs: ordered.  Risk OTC drugs. Prescription drug management.   Patient brought in by parents for evaluation of behavioral changes after starting on IM psychiatric medication for schizophrenia.  Patient appears to be responding to internal stimuli, mildly agitated, frequently attempting to leave.  IVC completed for patient's safety.  Labs with mild hypokalemia-will replace orally.  He did require medications and frequent redirection during his ED stay.  He has been medically cleared for psychiatric evaluation and treatment.        Final Clinical Impression(s) / ED Diagnoses Final diagnoses:  None    Rx / DC Orders ED Discharge Orders     None         Quintella Reichert, MD 05/01/22 [phone]

## 2022-05-01 NOTE — ED Notes (Signed)
Patient father was at bedside and tool patient personal cell phone home with him

## 2022-05-01 NOTE — Progress Notes (Signed)
Pt medication focused, pt requesting Ambien and Ativan, pt informed to talk to the doctor. Pt refused PRN Zyprexa " It makes me feel like a girl"    05/01/22 2000  Psych Admission Type (Psych Patients Only)  Admission Status Involuntary  Psychosocial Assessment  Patient Complaints Other (Comment) (med seeking)  Eye Contact Fair  Facial Expression Anxious  Affect Anxious;Preoccupied  Speech Logical/coherent  Interaction Cautious;Assertive  Motor Activity Slow  Appearance/Hygiene Disheveled  Behavior Characteristics Restless;Anxious  Mood Preoccupied  Aggressive Behavior  Effect No apparent injury  Thought Process  Coherency Circumstantial  Content Preoccupation  Delusions WDL  Perception WDL  Hallucination None reported or observed  Judgment Poor  Confusion WDL  Danger to Self  Current suicidal ideation? Denies  Danger to Others  Danger to Others None reported or observed

## 2022-05-01 NOTE — ED Notes (Signed)
[name] at bedside to transport.

## 2022-05-01 NOTE — Progress Notes (Signed)
Admission Note: Patient is a 27 year old male who is admitted to the unit under IVC from [facility] for bizarre behaviors, aggression and medication noncompliance.  Per IVC: Patient has been acting bizarre, responding to internal stimuli, and laughing inappropriately.  Patient is alert and oriented x 4.  Presents with a flat affect and depressed mood.  Stated he doesn't know why he's here.  Admission plan of care reviewed, consent signed.  Skin assessment completed.  Skin is dry and intact.  No personal belongings available on admission.  Patient oriented to the unit, staff and room.  Routine safety checks initiated.  Patient is safe on the unit.

## 2022-05-01 NOTE — [facility] Group Notes (Signed)
Pt did not attend wrap-up group   

## 2022-05-01 NOTE — ED Triage Notes (Addendum)
Pt brought in by his parents who report they believe he has taken some sort of drug or medication because he has been very restless, not acting himself and laughs in inappropriate times. They report they last saw him normal at 10am. The patient reports he took a "marijuana pill this morning" but denies any other substances. Denies ETOH. He is A&OX4. His father reports the patient has a hx of Schizophrenia and is not taking his medication.

## 2022-05-01 NOTE — ED Notes (Signed)
No belongings to found at nurses station, was note that father took his cell phone and possibly his belongings.

## 2022-05-01 NOTE — ED Notes (Signed)
Pt is refusing labs at this time. MD aware. His parents remain at the bedside in triage area.

## 2022-05-01 NOTE — ED Notes (Signed)
Patient walked out of facility staff attempted to redirect patient back in ED and was successful, NP made aware and security called.

## 2022-05-01 NOTE — Consult Note (Signed)
BH ED ASSESSMENT   Reason for Consult:  Psych Consult Referring Physician:  Dr. Madilyn Hook Patient Identification: Brendan Hogan MRN:  [id number] ED Chief Complaint: <principal problem not specified>  Diagnosis:  Active Problems:   Schizophrenia [facility])   ED Assessment Time Calculation: Start Time: 0930 Stop Time: 0945 Total Time in Minutes (Assessment Completion): 15   HPI: Per triage note patient brought in by his parents who report they believe he has taken some sort of drug or medication because he has been very restless, not acting himself and laughs in inappropriate times.  They report they last saw him normal at 10 AM on 04/30/2022.  The patient reports he took a "marijuana pill this morning "but denies any other substances.  Denies EtOH.  He is alert and oriented x 4. His father reports the patient has a history of schizophrenia and has not taken his medication.   Subjective:  Brendan Hogan, 27 y.o., male patient seen face to face by this provider, and chart reviewed on 05/01/22. On evaluation Brendan Hogan patient is laying in his bed with covers pulled up above his head as soon as he is seen this provider he stated "I need to leave to see my mom and dad ".  When asked why he was here patient stated "I do not know, do not you ".  Patient's nurse walked over while provider was talking with patient and asked to speak to provider, she stated that patient walked out of facility staff attempted to redirect patient back in ED and was successful, patient was given Ativan and Zyprexa PO during admission, which he spit out the Zyprexa due to refusing to dress out and given labs.  Patient is irritable when talking with the provider, stating I need to get out of here, when asked if he had the diagnosis or what medications she takes patient said Prolixin injection and Depakote, Ambien.  Patient stated he lives at home with his parents.  During evaluation Brendan Hogan is laying on his bed in no  acute distress.  He is alert, oriented x 4, restless, guarded and distracted as if he was about to leave the facility.  His mood is irritable and flat affect.  He has slightly slow speech low volume. Objectively there is no evidence of psychosis/mania or delusional thinking.  Patient has minimal interaction with provider, and at times appears to be distracted about leaving to go home. He denies suicidal/self-harm/homicidal ideation, psychosis, and paranoia.  When asked patient if he uses illicit substances and alcohol he said no, and informed him that his UDS was positive for [facility], asked him if he took a marijuana pill yesterday morning and patient became irritable and said I did not say that.   Spoke with patient's father Marrell Dicaprio with patient's permission, he is also patient's legal guardian, Mr. Haywood Pao stated that he wanted his son to be admitted in a long-term facility, states that patient has been in and out of hospitals, and when he comes home he does not take his medications, and they (parents) cannot get him to take his medications.  Parents state that he has not been the same since receiving IM injections for his schizophrenia.  This started about 1 month ago and he receives it every 2 weeks Prolixin Inj., patient father also reported that he is taking excessive amounts of Ambien which was prescribed for poor sleep.  Provider discussed with father about getting patient on a ACT team which can  assist them in helping patient take his medications father said "oh okay thank you but no ".  I did explain to father that we will be recommending inpatient psychiatric treatment and he is agreeable, but continues to say patient needs long-term treatment.  Father is unsure if patient is using any other substances.   Past Psychiatric History: Schizophrenia, and Prior psychiatric Admissions  Risk to Self or Others: Is the patient at risk to self? No Has the patient been a risk to self in the past 6 months?  No Has the patient been a risk to self within the distant past? No Is the patient a risk to others? No Has the patient been a risk to others in the past 6 months? No Has the patient been a risk to others within the distant past? No  [place] Scale:  Flowsheet Row ED [facility]  C-SSRS RISK CATEGORY Error: Q3, 4, or 5 should not be populated when Q2 is No Error: Q3, 4, or 5 should not be populated when Q2 is No Error: Q3, 4, or 5 should not be populated when Q2 is No       AIMS:  , , ,  ,   ASAM:    Substance Abuse:     Past Medical History:  Past Medical History:  Diagnosis Date   Psychosis (HCC) 05/2019   [facility]    Seasonal allergies     Past Surgical History:  Procedure Laterality Date   TONSILLECTOMY     Family History:  Family History  Problem Relation Age of Onset   Hypertension Mother     Social History:  Social History   Substance and Sexual Activity  Alcohol Use Not Currently   Comment: rare     Social History   Substance and Sexual Activity  Drug Use No    Social History   Socioeconomic History   Marital status: Single    Spouse name: Not on file   Number of children: Not on file   Years of education: Not on file   Highest education level: Not on file  Occupational History   Not on file  Tobacco Use   Smoking status: Every Day    Packs/day: 1.00    Types: Cigarettes   Smokeless tobacco: Former  Building services engineerVaping Use   Vaping Use: Never used  Substance and Sexual Activity   Alcohol use: Not Currently    Comment: rare   Drug use: No   Sexual activity: Not Currently  Other Topics Concern   Not on file  Social History Narrative   Not on file   Social Determinants of Health   Financial Resource Strain: Not on file  Food Insecurity: No  Food Insecurity (01/20/2022)   Hunger Vital Sign    Worried About Running Out of Food in the Last Year: Never true    Ran Out of Food in the Last Year: Never true  Transportation Needs: No Transportation Needs (01/20/2022)   PRAPARE - Administrator, Civil ServiceTransportation    Lack of Transportation (Medical): No    Lack of Transportation (Non-Medical): No  Physical Activity: Not on file  Stress: Not on file  Social Connections: Not on file   Additional Social History:    Allergies:   Allergies  Allergen Reactions   Haldol [Haloperidol] Other (See Comments)  Muscle spasms- involuntary muscle contractions   Geodon [Ziprasidone] Other (See Comments)    Patient reports that this medicine made him feel bad. He has difficult describing symptoms. He requests not to take anymore.     Labs:  Results for orders placed or performed during the hospital encounter of 05/01/22 (from the past 48 hour(s))  Comprehensive metabolic panel     Status: Abnormal   Collection Time: 05/01/22  4:10 AM  Result Value Ref Range   Sodium 136 135 - 145 mmol/L   Potassium 3.3 (L) 3.5 - 5.1 mmol/L   Chloride 98 98 - 111 mmol/L   CO2 26 22 - 32 mmol/L   Glucose, Bld 126 (H) 70 - 99 mg/dL    Comment: Glucose reference range applies only to samples taken after fasting for at least 8 hours.   BUN 11 6 - 20 mg/dL   Creatinine, Ser 0.97 0.61 - 1.24 mg/dL   Calcium 9.6 8.9 - 10.3 mg/dL   Total Protein 7.9 6.5 - 8.1 g/dL   Albumin 4.7 3.5 - 5.0 g/dL   AST 20 15 - 41 U/L   ALT 14 0 - 44 U/L   Alkaline Phosphatase 81 38 - 126 U/L   Total Bilirubin 0.8 0.3 - 1.2 mg/dL   GFR, Estimated >60 >60 mL/min    Comment: (NOTE) Calculated using the CKD-EPI Creatinine Equation (2021)    Anion gap 12 5 - 15    Comment: Performed at [facility], [place] [address] [place], [place] [address]  Ethanol     Status: None   Collection Time: 05/01/22  4:10 AM  Result Value Ref Range   Alcohol, Ethyl (B) <10 <10 mg/dL    Comment:  (NOTE) Lowest detectable limit for serum alcohol is 10 mg/dL.  For medical purposes only. Performed at [facility], [place] [address] [place], [place] [address]   cbc     Status: None   Collection Time: 05/01/22  4:10 AM  Result Value Ref Range   WBC 6.9 4.0 - 10.5 K/uL   RBC 5.57 4.22 - 5.81 MIL/uL   Hemoglobin 15.9 13.0 - 17.0 g/dL   HCT 47.5 39.0 - 52.0 %   MCV 85.3 80.0 - 100.0 fL   MCH 28.5 26.0 - 34.0 pg   MCHC 33.5 30.0 - 36.0 g/dL   RDW 12.3 11.5 - 15.5 %   Platelets 281 150 - 400 K/uL   nRBC 0.0 0.0 - 0.2 %    Comment: Performed at [facility], [place] [address] [place], [place] [address]  Valproic acid level     Status: Abnormal   Collection Time: 05/01/22  4:10 AM  Result Value Ref Range   Valproic Acid Lvl <10 (L) 50.0 - 100.0 ug/mL    Comment: RESULT CONFIRMED BY MANUAL DILUTION Performed at [place] [address], [place], [place] [address]   Rapid urine drug screen (hospital performed)     Status: Abnormal   Collection Time: 05/01/22  6:10 AM  Result Value Ref Range   Opiates NONE DETECTED NONE DETECTED   Cocaine NONE DETECTED NONE DETECTED   Benzodiazepines NONE DETECTED NONE DETECTED   Amphetamines NONE DETECTED NONE DETECTED   Tetrahydrocannabinol POSITIVE (A) NONE DETECTED   Barbiturates NONE DETECTED NONE DETECTED    Comment: (NOTE) DRUG SCREEN FOR MEDICAL PURPOSES ONLY.  IF CONFIRMATION IS NEEDED FOR ANY PURPOSE, NOTIFY LAB WITHIN 5 DAYS.  LOWEST DETECTABLE LIMITS FOR URINE  DRUG SCREEN Drug Class                     Cutoff (ng/mL) Amphetamine and metabolites    1000 Barbiturate and metabolites    200 Benzodiazepine                 200 Opiates and metabolites        300 Cocaine and metabolites        300 THC                            50 Performed at [facility], [address] [place], [place] [address]   Resp panel by RT-PCR (RSV, Flu A&B, Covid) Anterior  Nasal Swab     Status: None   Collection Time: 05/01/22  6:35 AM   Specimen: Anterior Nasal Swab  Result Value Ref Range   SARS Coronavirus 2 by RT PCR NEGATIVE NEGATIVE    Comment: (NOTE) SARS-CoV-2 target nucleic acids are NOT DETECTED.  The SARS-CoV-2 RNA is generally detectable in upper respiratory specimens during the acute phase of infection. The lowest concentration of SARS-CoV-2 viral copies this assay can detect is 138 copies/mL. A negative result does not preclude SARS-Cov-2 infection and should not be used as the sole basis for treatment or other patient management decisions. A negative result may occur with  improper specimen collection/handling, submission of specimen other than nasopharyngeal swab, presence of viral mutation(s) within the areas targeted by this assay, and inadequate number of viral copies(<138 copies/mL). A negative result must be combined with clinical observations, patient history, and epidemiological information. The expected result is Negative.  Fact Sheet for Patients:  BloggerCourse.com  Fact Sheet for Healthcare Providers:  SeriousBroker.it  This test is no t yet approved or cleared by the [place] FDA and  has been authorized for detection and/or diagnosis of SARS-CoV-2 by FDA under an Emergency Use Authorization (EUA). This EUA will remain  in effect (meaning this test can be used) for the duration of the COVID-19 declaration under Section 564(b)(1) of the Act, 21 U.S.C.section 360bbb-3(b)(1), unless the authorization is terminated  or revoked sooner.       Influenza A by PCR NEGATIVE NEGATIVE   Influenza B by PCR NEGATIVE NEGATIVE    Comment: (NOTE) The Xpert Xpress SARS-CoV-2/FLU/RSV plus assay is intended as an aid in the diagnosis of influenza from Nasopharyngeal swab specimens and should not be used as a sole basis for treatment. Nasal washings and aspirates are unacceptable  for Xpert Xpress SARS-CoV-2/FLU/RSV testing.  Fact Sheet for Patients: BloggerCourse.com  Fact Sheet for Healthcare Providers: SeriousBroker.it  This test is not yet approved or cleared by the [place] FDA and has been authorized for detection and/or diagnosis of SARS-CoV-2 by FDA under an Emergency Use Authorization (EUA). This EUA will remain in effect (meaning this test can be used) for the duration of the COVID-19 declaration under Section 564(b)(1) of the Act, 21 U.S.C. section 360bbb-3(b)(1), unless the authorization is terminated or revoked.     Resp Syncytial Virus by PCR NEGATIVE NEGATIVE    Comment: (NOTE) Fact Sheet for Patients: BloggerCourse.com  Fact Sheet for Healthcare Providers: SeriousBroker.it  This test is not yet approved or cleared by the [place] FDA and has been authorized for detection and/or diagnosis of SARS-CoV-2 by FDA under an Emergency Use Authorization (EUA). This EUA will remain in effect (meaning this test can be used)  for the duration of the COVID-19 declaration under Section 564(b)(1) of the Act, 21 U.S.C. section 360bbb-3(b)(1), unless the authorization is terminated or revoked.  Performed at [facility], [place] [address], [place], [place] [address]     Current Facility-Administered Medications  Medication Dose Route Frequency Provider Last Rate Last Admin   alum & mag hydroxide-simeth (MAALOX/MYLANTA) 200-200-20 MG/5ML suspension 30 mL  30 mL Oral Q6H PRN Quintella Reichert, MD       ibuprofen (ADVIL) tablet 600 mg  600 mg Oral Q8H PRN Quintella Reichert, MD       OLANZapine zydis [facility]) disintegrating tablet 10 mg  10 mg Oral Once Quintella Reichert, MD       ondansetron [facility]) tablet 4 mg  4 mg Oral Q8H PRN Quintella Reichert, MD       zolpidem Lorrin Mais) tablet 5 mg  5 mg Oral QHS PRN Quintella Reichert, MD        Current Outpatient Medications  Medication Sig Dispense Refill   benztropine (COGENTIN) 1 MG tablet Take 1 tablet (1 mg total) by mouth 2 (two) times daily. 60 tablet 3   cloNIDine (CATAPRES) 0.1 MG tablet Take 1 tablet (0.1 mg total) by mouth 2 (two) times daily. 60 tablet 3   divalproex (DEPAKOTE) 250 MG DR tablet Take 3 tablets (750 mg total) by mouth every 12 (twelve) hours. 180 tablet 3   fluPHENAZine (PROLIXIN) 10 MG tablet Take 1 tablet (10 mg total) by mouth every 12 (twelve) hours. (Patient not taking: Reported on 03/11/2022) 5 tablet 0   fluPHENAZine decanoate (PROLIXIN) 25 MG/ML injection Inject 1 mL (25 mg total) into the muscle every 21 ( twenty-one) days. NEXT ADMINISTRATION IS DUE ON 02-25-2022. 5 mL 11   nicotine polacrilex (NICORETTE) 2 MG gum Take 1 each (2 mg total) by mouth as needed for smoking cessation. 100 tablet 3   traZODone (DESYREL) 50 MG tablet Take 1 tablet (50 mg total) by mouth at bedtime. 30 tablet 3    Musculoskeletal: Strength & Muscle Tone: within normal limits Gait & Station: normal Patient leans: N/A   Psychiatric Specialty Exam: Presentation  General Appearance:  Appropriate for Environment  Eye Contact: Fair  Speech: Slow  Speech Volume: Decreased  Handedness: Right   Mood and Affect  Mood: Anxious; Irritable  Affect: Constricted (guarded)   Thought Process  Thought Processes: Irrevelant  Descriptions of Associations:Loose  Orientation:Full (Time, Place and Person)  Thought Content:WDL  History of Schizophrenia/Schizoaffective disorder:Yes  Duration of Psychotic Symptoms:Greater than six months  Hallucinations:Hallucinations: None  Ideas of Reference:None  Suicidal Thoughts:Suicidal Thoughts: No  Homicidal Thoughts:Homicidal Thoughts: No   Sensorium  Memory: Immediate Poor; Recent Poor  Judgment: Poor  Insight: Poor   Executive Functions  Concentration: Poor  Attention  Span: Poor  Recall: Poor  Fund of Knowledge: Poor  Language: Fair   Psychomotor Activity  Psychomotor Activity: Psychomotor Activity: Restlessness   Assets  Assets: Social Support; Housing    Sleep  Sleep: Sleep: Fair   Physical Exam: Physical Exam Pulmonary:     Effort: Pulmonary effort is normal.  Musculoskeletal:        General: Normal range of motion.     Cervical back: Normal range of motion.  Neurological:     Mental Status: He is alert.  Psychiatric:        Attention and Perception: He is inattentive.        Mood and Affect: Mood is anxious.  Speech: Speech normal.        Behavior: Behavior is uncooperative and agitated.        Thought Content: Thought content is delusional.        Cognition and Memory: Cognition normal.        Judgment: Judgment is impulsive.    Review of Systems  HENT: Negative.    Eyes: Negative.   Genitourinary: Negative.   Psychiatric/Behavioral:  Positive for hallucinations. The patient is nervous/anxious.    Blood pressure 126/81, pulse (!) 104, temperature 98 F (36.7 C), temperature source Oral, resp. rate 18, height 5\' 8"  (1.727 m), weight 65.8 kg, SpO2 100 %. Body mass index is 22.05 kg/m.  Medical Decision Making: Inpatient Admission Currently under review at [facility]   Disposition: Recommend psychiatric Inpatient admission when medically cleared.  [facility], PMHNP 05/01/2022 12:22 PM

## 2022-05-01 NOTE — ED Notes (Signed)
Pt had no belongings located in cabinets by nurses station

## 2022-05-01 NOTE — ED Notes (Signed)
Pt had a shower. 

## 2022-05-01 NOTE — ED Notes (Signed)
PT refusing to change out. Triage RN made aware.

## 2022-05-01 NOTE — ED Notes (Signed)
Pt refusing to dress out, take meds, and allow for lab and urine specimens to be obtained. Security at bedside attempting to assist. Pt finally agreed to take ativan but spit out zyprexa after putting it in his mouth.

## 2022-05-01 NOTE — ED Notes (Signed)
[name] called for transport to [facility] 

## 2022-05-01 NOTE — ED Notes (Signed)
Pt changed into burgundy scrubs. Belongings were sent home with parents.

## 2022-05-01 NOTE — ED Notes (Addendum)
Pt had 2 sandwiches and a sprite

## 2022-05-02 DIAGNOSIS — F201 Disorganized schizophrenia: Secondary | ICD-10-CM

## 2022-05-02 LAB — LIPID PANEL
Cholesterol: 162 mg/dL (ref 0–200)
HDL: 52 mg/dL (ref >40)
LDL Cholesterol: 96 mg/dL (ref 0–99)
Total CHOL/HDL Ratio: 3.1 RATIO
Triglycerides: 71 mg/dL (ref <150)
VLDL: 14 mg/dL (ref 0–40)

## 2022-05-02 LAB — TSH: TSH: 3.062 u[IU]/mL (ref 0.350–4.500)

## 2022-05-02 MED ORDER — MIRTAZAPINE 15 MG PO TBDP
15.0000 mg | ORAL_TABLET | Freq: Every day | ORAL | Status: DC
  Administered 2022-05-02 – 2022-05-09 (×8): 15 mg via ORAL
  Filled 2022-05-02 (×6): qty 1
  Filled 2022-05-02: qty 10
  Filled 2022-05-02 (×4): qty 1

## 2022-05-02 NOTE — Progress Notes (Signed)
Recreation Therapy Notes  Patient admitted to unit 1.3.24. Due to admission within last year, no new recreation therapy assessment conducted at this time. Last assessment conducted on 9.27.23.    Reason for current admission per patient, "I'm not quite sure".  Patient reports no stressors at this time.  Patient reports goal of "make it out today".  Patient denies SI, HI, AVH at this time.   Patient reports strength, areas of improvement, coping skills and leisure interests as the same as previous admission.   Information found below from assessment conducted 9.27.23.  Coping Skills: Sports, Exercise, Talk  Leisure Interests: TV, Phone, Nature  Strengths: Hard worker  Areas of Improvement: "How I react to things", Improve social skills     Brendan Hogan, LRT,CTRS [name] 05/02/2022 12:40 PM

## 2022-05-02 NOTE — Progress Notes (Signed)
   05/02/22 0604  15 Minute Checks  Location Bedroom  Visual Appearance Calm  Behavior Sleeping  Sleep (Behavioral Health Patients Only)  Calculate sleep? (Click Yes once per 24 hr at 0600 safety check) Yes  Documented sleep last 24 hours 8

## 2022-05-02 NOTE — Progress Notes (Signed)
Pt continues to request Ambien    05/02/22 1900  Psych Admission Type (Psych Patients Only)  Admission Status Involuntary  Psychosocial Assessment  Patient Complaints Worrying;Suspiciousness  Eye Contact Fair  Facial Expression Anxious  Affect Anxious;Preoccupied  Speech Logical/coherent  Interaction Cautious;Assertive  Motor Activity Slow  Appearance/Hygiene Disheveled  Behavior Characteristics Cooperative;Anxious;Restless  Mood Suspicious;Preoccupied  Aggressive Behavior  Effect No apparent injury  Thought Process  Coherency Circumstantial  Content Preoccupation  Delusions WDL  Perception WDL  Hallucination None reported or observed  Judgment Poor  Confusion WDL  Danger to Self  Current suicidal ideation? Denies  Danger to Others  Danger to Others None reported or observed

## 2022-05-02 NOTE — Progress Notes (Signed)
Adult Psychoeducational Group Note  Date:  05/02/2022 Time:  10:57 AM  Group Topic/Focus:  Goals Group:   The focus of this group is to help patients establish daily goals to achieve during treatment and discuss how the patient can incorporate goal setting into their daily lives to aide in recovery.  Participation Level:  Active  Participation Quality:  Appropriate  Affect:  Appropriate  Cognitive:  Appropriate  Insight: Appropriate  Engagement in Group:  Engaged  Modes of Intervention:  Discussion  Additional Comments:  Patient attended morning orientation group and participated.   Cleotha Tsang W Riddhi Grether 6/0/6301, 10:57 AM

## 2022-05-02 NOTE — Group Note (Signed)
Recreation Therapy Group Note   Group Topic:Health and Wellness  Group Date: 05/02/2022 Start Time: 1000 End Time: 1040 Facilitators: Zimir Kittleson-McCall, LRT,CTRS Location: [address] Dayroom   Goal Area(s) Addresses:  Patient will define components of whole wellness. Patient will verbalize benefit of whole wellness.   Group Description:  Wellness.  LRT and patients discussed the importance of wellness and its affect on the body.  LRT then explained to patients, they would engage in movement (dancing, exercises, etc).  LRT also let the patients know they would be leading the group.  Whatever, one patient chose to do, everyone else would follow along.  This gave everyone a chance to engage in the movement of their choice.   Affect/Mood: N/A   Participation Level: Did not attend    Clinical Observations/Individualized Feedback:     Plan: Continue to engage patient in RT group sessions 2-3x/week.   Athalene Kolle-McCall, LRT,CTRS 05/02/2022 12:03 PM

## 2022-05-02 NOTE — Progress Notes (Signed)
Dar Note: Patient presents with anxious affect and paranoid mood.  Denies suicidal thoughts, auditory and visual hallucinations.  Patient observed cheeking his medication while pretending to swallow it.  Patient reminded of possible force medication order if behavior continues.  Routine safety checks maintained.  Patient is safe on and off the unit.

## 2022-05-02 NOTE — BHH Suicide Risk Assessment (Signed)
[facility] Admission Suicide Risk Assessment   Nursing information obtained from:  Patient Demographic factors:  Male Current Mental Status:  NA Loss Factors:  NA Historical Factors:  NA Risk Reduction Factors:  NA  Total Time spent with patient: 20 minutes Principal Problem: Schizophrenia ([place]) Diagnosis:  Principal Problem:   Schizophrenia ([place])  Subjective Data: see H&P  Continued Clinical Symptoms:    The "Alcohol Use Disorders Identification Test", Guidelines for Use in Primary Care, Second Edition.  World Pharmacologist [facility]). Score between 0-7:  no or low risk or alcohol related problems. Score between 8-15:  moderate risk of alcohol related problems. Score between 16-19:  high risk of alcohol related problems. Score 20 or above:  warrants further diagnostic evaluation for alcohol dependence and treatment.   CLINICAL FACTORS:   Alcohol/Substance Abuse/Dependencies Schizophrenia:   Paranoid or undifferentiated type More than one psychiatric diagnosis Currently Psychotic   Psychiatric Specialty Exam:  Presentation  General Appearance:  Disheveled  Eye Contact: Fair  Speech: Normal Rate  Speech Volume: Normal  Handedness: Right   Mood and Affect  Mood: Anxious  Affect: Full Range   Thought Process  Thought Processes: Goal Directed  Descriptions of Associations:Intact  Orientation:Full (Time, Place and Person)  Thought Content:Logical  History of Schizophrenia/Schizoaffective disorder:Yes  Duration of Psychotic Symptoms:Greater than six months  Hallucinations:Hallucinations: None  Ideas of Reference:None  Suicidal Thoughts:Suicidal Thoughts: No  Homicidal Thoughts:Homicidal Thoughts: No   Sensorium  Memory: Immediate Good; Recent Good; Remote Good  Judgment: Impaired  Insight: Lacking   Executive Functions  Concentration: Fair  Attention Span: Poor  Recall: Poor  Fund of  Knowledge: Poor  Language: Fair   Psychomotor Activity  Psychomotor Activity: Psychomotor Activity: Normal   Assets  Assets: Social Support; Housing   Sleep  Sleep: Sleep: Fair    Physical Exam: Physical Exam See H&P  ROS See H&P  Blood pressure 107/73, pulse (!) 106, temperature (!) 97.5 F (36.4 C), temperature source Oral, resp. rate 16, height 5\' 5"  (1.651 m), weight 63 kg, SpO2 100 %. Body mass index is 23.13 kg/m.   COGNITIVE FEATURES THAT CONTRIBUTE TO RISK:  None    SUICIDE RISK:   Mild:  There are no identifiable suicide plans, no associated intent, mild dysphoria and related symptoms, good self-control (both objective and subjective assessment), few other risk factors, and identifiable protective factors, including available and accessible social support.   PLAN OF CARE: see H&P  I certify that inpatient services furnished can reasonably be expected to improve the patient's condition.   Christoper Allegra, MD 05/02/2022, 12:49 PM

## 2022-05-02 NOTE — [facility]HH Group Notes (Signed)
Pt attended wrap up group 

## 2022-05-02 NOTE — H&P (Addendum)
Psychiatric Admission Assessment Adult  Patient Identification: Brendan Hogan MRN:  [id number] Date of Evaluation:  05/02/2022 Chief Complaint:  Schizophrenia ([place]) [F20.9] Principal Diagnosis: Schizophrenia ([place]) Diagnosis:  Principal Problem:   Schizophrenia ([place])  History of Present Illness:   SHAKIEM FILIPEK is a 27 y.o., male with a past psychiatric history significant for schizophrenia who was admitted to the psychiatric hospital under IVC for bizarre behaviors, agitation, appearing to respond to internal stimuli.   Prior to admission outpatient psychiatric medications: Father reports patient is receiving Prolixin shot every 2 weeks, is overtaking Ambien 10 mg, is not taking other prescribed medications mirtazapine and Depakote.  Per Father Jep Stjulian [phone], legal gaurdian: Patient is overusing Ambien "taking it like a street drug.  Taking it 2-3 times at once.  Then goes outside drives.  Per father when the patient goes outside he leaves without shoes, in cold weather, approaching strangers "he is going to hurt himself or others".  Father states "his mind is out" and describes disorganized thinking at home.  The father states the patient has multiple medications at home that he is supposed to take but does not; with the exception of an BM, which the father reports the patient overtakes.  The father states that when the patient takes Ambien, he leaves the house this does not put him to sleep.  Father states that when he does take mirtazapine, is effective for sleep.  Father is interested in rehab treatment for the patient's problem with Ambien.  On my evaluation, the patient is somewhat cooperative.  He is fixated on discharge.  He gives objectively false information, such as stating that he does take his Depakote (although his Depakote level was undetectable on admission) and that he does not use any type of illicit substance or marijuana (he does have a positive THC on UDS).   Taking into account that the patient appears to be providing information that is not true for her to present himself as having no psychiatric symptoms in order to be discharged, the patient does report the following information: The patient reports that his mood is euthymic.  Reports feeling anxious about being in the hospital.  Reports that his sleep is not good unless he takes Ambien.  Reports not sleeping well last night (nursing documentation that the patient slept 8 hours, without receiving Ambien).  Reports that appetite is within normal limits.  Denies SI.  Denies HI.  Denies AH, VH, any paranoid symptoms.  Denies any ideas of reference, thought control, thought insertion.  He does not seem to have disorganized thinking during my exam.  He is not manic.   Past psychiatric history: Prior Psychiatric diagnoses: Schizophrenia Past Psychiatric Hospitalizations: Multiple, last hospitalization September 2023 at [facility]    History of self mutilation: None noted Past suicide attempts: Patient reports 1 suicide attempt but "long time ago" but unable to elaborate Past history of HI, violent or aggressive behavior: Per chart review history of violence and agitation towards family members   Past Psychiatric medications trials: Haldol caused EPS and neck stiffness best response was obtained from [facility] and [facility] but patient is noncompliant, risperidone and Risperdal Consta were prescribed in the past but unable to confirm if was effective. Patient was most recently discharged on Prolixin oral, Prolixin LAI, Depakote, Ativan, and Ambien  History of ECT/TMS: Patient denies   Outpatient psychiatric Follow up: [facility]  Prior Outpatient Therapy: Unknown  Substance Use History  My exam today, the patient denies using any  alcohol, having any alcohol problem.  Denies any nicotine, tobacco use.  Denies any illicit substance use including marijuana.  Per previous H&P Sept 2023 substance use  history: Alcohol: Patient tells me in a disorganized manner that on 6/24 he was found to be intoxicated and he describes what seems to be a DUI charge but I am unable to confirm such incident, per chart review family reported previously to different staff that patient occasionally drinks alcohol Tobacoo: Smokes 1 pack of cigarette daily Marijuana: Patient denies Cocaine: Patient denies Stimulants: Patient denies IV drug use: Denies Opiates: Denies Prescribed Meds abuse: Denies H/O withdrawals, blackouts, DTs: Denies History of Detox / Rehab: Denies DUI: Patient describes in 6/24 what seems to be a DUI but I am unable to confirm given he presents disorganized and unreliable.  Family Psychiatric History:  Psychiatric illness: Patient denies Suicide: Patient denies Substance Abuse: Patient denies   Social history: Living situation: Lives with father Social support: Parents Marital Status: Never married Children: No children Education: Unknown Employment: Print production planner service: Denies Legal history: Has 2 court cases pending per father trauma: Unknown  Total Time spent with patient: 20 minutes    Is the patient at risk to self? Yes.    Has the patient been a risk to self in the past 6 months? Yes.    Has the patient been a risk to self within the distant past? Yes.    Is the patient a risk to others? Yes.    Has the patient been a risk to others in the past 6 months? Yes.    Has the patient been a risk to others within the distant past? Yes.     [place] Scale:  Flowsheet Row [facility] Most recent reading at 05/01/2022  3:00 PM ED from 05/01/2022 in [facility] Most recent reading at 05/01/2022  2:16 AM ED from 04/15/2022 in [facility] EMERGENCY [facility]PART[facility] Most recent reading at 04/15/2022  8:40 PM  C-SSRS RISK CATEGORY No Risk Error: Q3, 4, or 5 should not  be populated when Q2 is No Error: Q3, 4, or 5 should not be populated when Q2 is No        Prior Inpatient Therapy: Yes.   If yes, [facility] sept 2023  Prior Outpatient Therapy: Yes.   If yes, describe[facility]zy    Alcohol Screening: Patient refused Alcohol Screening Tool: Yes Substance Abuse History in the last 12 months:  Yes.   Consequences of Substance Abuse: Negative Previous Psychotropic Medications: Yes  Psychological Evaluations: Yes  Past Medical History:  Past Medical History:  Diagnosis Date   Psychosis (HCC) 05/2019   [facility]    Seasonal allergies     Past Surgical History:  Procedure Laterality Date   TONSILLECTOMY     Family History:  Family History  Problem Relation Age of Onset   Hypertension Mother     Tobacco Screening:  Social History   Tobacco Use  Smoking Status Every Day   Packs/day: 1.00   Types: Cigarettes  Smokeless Tobacco Former    BH Tobacco Counseling     Are you interested in Tobacco Cessation Medications?  Yes, implement Nicotene Replacement Protocol Counseled patient on smoking cessation:  Yes Reason Tobacco Screening Not Completed: No value filed.       Social History:  Social History   Substance and Sexual Activity  Alcohol Use Not Currently  Comment: rare     Social History   Substance and Sexual Activity  Drug Use No    Additional Social History:                           Allergies:   Allergies  Allergen Reactions   Haldol [Haloperidol] Other (See Comments)    Muscle spasms- involuntary muscle contractions   Geodon [Ziprasidone] Other (See Comments)    Patient reports that this medicine made him feel bad. He has difficult describing symptoms. He requests not to take anymore.    Lab Results:  Results for orders placed or performed during the hospital encounter of 05/01/22 (from the past 48 hour(s))  Lipid panel     Status: None   Collection Time: 05/02/22  6:25 AM  Result Value Ref  Range   Cholesterol 162 0 - 200 mg/dL   Triglycerides 71 <150 mg/dL   HDL 52 >40 mg/dL   Total CHOL/HDL Ratio 3.1 RATIO   VLDL 14 0 - 40 mg/dL   LDL Cholesterol 96 0 - 99 mg/dL    Comment:        Total Cholesterol/HDL:CHD Risk Coronary Heart Disease Risk Table                     Men   Women  1/2 Average Risk   3.4   3.3  Average Risk       5.0   4.4  2 X Average Risk   9.6   7.1  3 X Average Risk  23.4   11.0        Use the calculated Patient Ratio above and the CHD Risk Table to determine the patient's CHD Risk.        ATP III CLASSIFICATION (LDL):  <100     mg/dL   Optimal  100-129  mg/dL   Near or Above                    Optimal  130-159  mg/dL   Borderline  160-189  mg/dL   High  >190     mg/dL   Very High Performed at [facility] [address] [place], [place] [address]   TSH     Status: None   Collection Time: 05/02/22  6:25 AM  Result Value Ref Range   TSH 3.062 0.350 - 4.500 uIU/mL    Comment: Performed by a 3rd Generation assay with a functional sensitivity of <=0.01 uIU/mL. Performed at [facility], [place] [address] [place], [place] [address]     Blood Alcohol level:  Lab Results  Component Value Date   ETH <10 05/01/2022   ETH <10 [id number]    Metabolic Disorder Labs:  Lab Results  Component Value Date   HGBA1C 4.8 01/23/2022   MPG 91.06 01/23/2022   MPG 100 05/19/2021   Lab Results  Component Value Date   PROLACTIN 26.5 (H) 09/08/2019   Lab Results  Component Value Date   CHOL 162 05/02/2022   TRIG 71 05/02/2022   HDL 52 05/02/2022   CHOLHDL 3.1 05/02/2022   VLDL 14 05/02/2022   LDLCALC 96 05/02/2022   LDLCALC 91 01/23/2022    Current Medications: Current Facility-Administered Medications  Medication Dose Route Frequency Provider Last Rate Last Admin   acetaminophen (TYLENOL) tablet 650 mg  650 mg Oral Q6H PRN Motley-Mangrum, Al Pimple, PMHNP  benztropine (COGENTIN) tablet 1 mg  1 mg  Oral BID Motley-Mangrum, Jadeka A, PMHNP   1 mg at 05/02/22 1106   divalproex (DEPAKOTE) DR tablet 750 mg  750 mg Oral Q12H Motley-Mangrum, Jadeka A, PMHNP   750 mg at 05/02/22 1106   hydrOXYzine (ATARAX) tablet 25 mg  25 mg Oral TID PRN Motley-Mangrum, Jadeka A, PMHNP   25 mg at 05/01/22 2023   LORazepam (ATIVAN) tablet 1 mg  1 mg Oral Once PRN Motley-Mangrum, Jadeka A, PMHNP       magnesium hydroxide (MILK OF MAGNESIA) suspension 30 mL  30 mL Oral Daily PRN Motley-Mangrum, Jadeka A, PMHNP       nicotine polacrilex (NICORETTE) gum 2 mg  2 mg Oral PRN Motley-Mangrum, Jadeka A, PMHNP   2 mg at 05/02/22 1106   OLANZapine (ZYPREXA) injection 10 mg  10 mg Intramuscular BID PRN Elanda Garmany, Ovid Curd, MD       OLANZapine zydis (ZYPREXA) disintegrating tablet 10 mg  10 mg Oral Once PRN Motley-Mangrum, Jadeka A, PMHNP       traZODone (DESYREL) tablet 50 mg  50 mg Oral QHS PRN Motley-Mangrum, Jadeka A, PMHNP   50 mg at 05/01/22 2023   PTA Medications: Medications Prior to Admission  Medication Sig Dispense Refill Last Dose   divalproex (DEPAKOTE ER) 500 MG 24 hr tablet Take 1,000 mg by mouth daily.      mirtazapine (REMERON) 15 MG tablet Take 15 mg by mouth at bedtime.      zolpidem (AMBIEN) 10 MG tablet Take 10 mg by mouth at bedtime.      benztropine (COGENTIN) 1 MG tablet Take 1 tablet (1 mg total) by mouth 2 (two) times daily. 60 tablet 3    cloNIDine (CATAPRES) 0.1 MG tablet Take 1 tablet (0.1 mg total) by mouth 2 (two) times daily. 60 tablet 3    fluPHENAZine decanoate (PROLIXIN) 25 MG/ML injection Inject 1 mL (25 mg total) into the muscle every 21 ( twenty-one) days. NEXT ADMINISTRATION IS DUE ON 02-25-2022. 5 mL 11     Musculoskeletal: Strength & Muscle Tone: Laying in bed  Gait & Station: Laying in bed  Patient leans: Laying in bed              Psychiatric Specialty Exam:  Presentation  General Appearance:  Disheveled  Eye Contact: Fair  Speech: Normal Rate  Speech  Volume: Normal  Handedness: Right   Mood and Affect  Mood: Anxious  Affect: Full Range   Thought Process  Thought Processes: Goal Directed  Duration of Psychotic Symptoms:N/A Past Diagnosis of Schizophrenia or Psychoactive disorder: Yes  Descriptions of Associations:Intact  Orientation:Full (Time, Place and Person)  Thought Content:Logical  Hallucinations:Hallucinations: None  Ideas of Reference:None  Suicidal Thoughts:Suicidal Thoughts: No  Homicidal Thoughts:Homicidal Thoughts: No   Sensorium  Memory: Immediate Good; Recent Good; Remote Good  Judgment: Impaired  Insight: Lacking   Executive Functions  Concentration: Fair  Attention Span: Poor  Recall: Poor  Fund of Knowledge: Poor  Language: Fair   Psychomotor Activity  Psychomotor Activity: Psychomotor Activity: Normal   Assets  Assets: Social Support; Housing   Sleep  Sleep: Sleep: Fair    Physical Exam: Physical Exam Vitals reviewed.  Constitutional:      General: He is not in acute distress.    Appearance: He is normal weight. He is not toxic-appearing.  Neurological:     Mental Status: He is alert.    Review of Systems  Psychiatric/Behavioral:  Positive for  substance abuse. The patient is nervous/anxious.    Blood pressure 107/73, pulse (!) 106, temperature (!) 97.5 F (36.4 C), temperature source Oral, resp. rate 16, height 5\' 5"  (1.651 m), weight 63 kg, SpO2 100 %. Body mass index is 23.13 kg/m.  Treatment Plan Summary: Daily contact with patient to assess and evaluate symptoms and progress in treatment   ASSESSMENT:  Diagnoses / Active Problems: -Schizophrenia   PLAN: Safety and Monitoring:  -- Involuntary admission to inpatient psychiatric unit for safety, stabilization and treatment  -- Daily contact with patient to assess and evaluate symptoms and progress in treatment  -- Patient's case to be discussed in multi-disciplinary team meeting  --  Observation Level : q15 minute checks  -- Vital signs:  q12 hours  -- Precautions: suicide, elopement, and assault  2. Psychiatric Diagnoses and Treatment:    -Continue Prolixin LAI - need to confirm last adminsitration, the give 25 mg q2weeks from last dose.  -Restart depakote 750 mg bid for mood stabilization - previously effective fro stabilization plus prolix during last admission. Non adherent at home  -Restart cogentin 1 mg bid for eps ppx  -Stop ambien -Restart remeron  -start zyprexa oral and IM PRN for agitation   --  The risks/benefits/side-effects/alternatives to this medication were discussed in detail with the patient and time was given for questions. The patient consents to medication trial.    -- Metabolic profile and EKG monitoring obtained while on an atypical antipsychotic (BMI: Lipid Panel: HbgA1c: QTc:)   -- Encouraged patient to participate in unit milieu and in scheduled group therapies   -- Short Term Goals: Ability to identify changes in lifestyle to reduce recurrence of condition will improve, Ability to verbalize feelings will improve, Ability to disclose and discuss suicidal ideas, Ability to demonstrate self-control will improve, Ability to identify and develop effective coping behaviors will improve, Ability to maintain clinical measurements within normal limits will improve, Compliance with prescribed medications will improve, and Ability to identify triggers associated with substance abuse/mental health issues will improve  -- Long Term Goals: Improvement in symptoms so as ready for discharge    3. Medical Issues Being Addressed:   Tobacco Use Disorder  -- Nicotine gum PRN   -- Smoking cessation encouraged  4. Discharge Planning:   -- Social work and case management to assist with discharge planning and identification of hospital follow-up needs prior to discharge  -- Estimated LOS: 5-7 days  -- Discharge Concerns: Need to establish a safety plan;  Medication compliance and effectiveness  -- Discharge Goals: Return home with outpatient referrals for mental health follow-up including medication management/psychotherapy  Observation Level/Precautions:  15 minute checks  Laboratory:  as above   Psychotherapy:    Medications:    Consultations:    Discharge Concerns:    Estimated LOS:5-7 days   Other:     Physician Treatment Plan for Primary Diagnosis: Schizophrenia [facility])   Physician Treatment Plan for Secondary Diagnosis: Principal Problem:   Schizophrenia ([place])   I certify that inpatient services furnished can reasonably be expected to improve the patient's condition.    Christoper Allegra, MD 1/4/202412:30 PM  Total Time Spent in Direct Patient Care:  I personally spent 60 minutes on the unit in direct patient care. The direct patient care time included face-to-face time with the patient, reviewing the patient's chart, communicating with other professionals, and coordinating care. Greater than 50% of this time was spent in counseling or coordinating care with the patient regarding goals  of hospitalization, psycho-education, and discharge planning needs.   Father LG wants rehan for Nadine use. Pt no longer has insurance per father - if SW can look into this, pt will need to consent as well.   Janine Limbo, MD Psychiatrist

## 2022-05-03 ENCOUNTER — Encounter (HOSPITAL_COMMUNITY): Payer: Self-pay

## 2022-05-03 DIAGNOSIS — F201 Disorganized schizophrenia: Secondary | ICD-10-CM | POA: Diagnosis not present

## 2022-05-03 LAB — HEMOGLOBIN A1C
Hgb A1c MFr Bld: 5.4 % (ref 4.8–5.6)
Mean Plasma Glucose: 108 mg/dL

## 2022-05-03 MED ORDER — FLUPHENAZINE DECANOATE 25 MG/ML IJ SOLN
25.0000 mg | INTRAMUSCULAR | Status: DC
  Administered 2022-05-03: 25 mg via INTRAMUSCULAR
  Filled 2022-05-03: qty 1

## 2022-05-03 NOTE — Progress Notes (Signed)
Adult Psychoeducational Group Note  Date:  05/03/2022 Time:  10:36 AM  Group Topic/Focus:  Goals Group:   The focus of this group is to help patients establish daily goals to achieve during treatment and discuss how the patient can incorporate goal setting into their daily lives to aide in recovery.  Participation Level:  Did Not Attend  Participation Quality:   n/a  Affect:   n/a  Cognitive:   n/a  Insight: None  Engagement in Group:   n/a  Modes of Intervention:   n/a  Additional Comments:   Patient did not attend the morning goals group.  Wetzel Bjornstad Brendan Hogan 05/03/2022, 10:36 AM

## 2022-05-03 NOTE — B[facility] Group Notes (Signed)
Spirituality group facilitated by Chaplain Katy Dianey Suchy, BCC.   Group Description: Group focused on topic of hope. Patients participated in facilitated discussion around topic, connecting with one another around experiences and definitions for hope. Group members engaged with visual explorer photos, reflecting on what hope looks like for them today. Group engaged in discussion around how their definitions of hope are present today in hospital.   Modalities: Psycho-social ed, [place]ian, Narrative, MI   Patient Progress: Did not attend.  

## 2022-05-03 NOTE — Progress Notes (Signed)
   05/03/22 0800  Psych Admission Type (Psych Patients Only)  Admission Status Involuntary  Psychosocial Assessment  Patient Complaints Suspiciousness  Eye Contact Fair  Facial Expression Anxious  Affect Anxious  Speech Logical/coherent  Interaction Cautious  Motor Activity Slow  Appearance/Hygiene Unremarkable  Behavior Characteristics Cooperative  Mood Suspicious  Thought Process  Coherency Circumstantial  Content Preoccupation  Delusions WDL  Perception WDL  Hallucination None reported or observed  Judgment Impaired  Confusion None  Danger to Self  Current suicidal ideation? Denies  Danger to Others  Danger to Others None reported or observed

## 2022-05-03 NOTE — Progress Notes (Signed)
[facility]  05/03/2022 2:25 PM Brendan CossMustafa O Fonder  MRN:  [id number]  Subjective:    Brendan Hogan is a 27 y.o., male with a past psychiatric history significant for schizophrenia who was admitted to the psychiatric hospital under IVC for bizarre behaviors, agitation, appearing to respond to internal stimuli.    On admission, the following recommendations were made: -Continue Prolixin LAI - need to confirm last adminsitration, the give 25 mg q2weeks from last dose.  -Restart depakote 750 mg bid for mood stabilization - previously effective fro stabilization plus prolix during last admission. Non adherent at home  -Restart cogentin 1 mg bid for eps ppx  -Stop ambien -Restart remeron  -start zyprexa oral and IM PRN for agitation   On evaluation today, the pt does not appear psychotic or responding to internal stimuli. Reports mood is stable. Denying SI, HI. Denying AH, VH, paranoia.  Asking for ambien. Slept well w/o ambien last night per nursing. Pt also reports sleeping well last night w/o ambien.   We discussed marijuana use and abusing ambien and pt is agreeable for residential substance use treatment, which his father (LG) is also agreeable with.   Denying s/e to current meds.   Per nursing, pt trying to take medications.    Principal Problem: Schizophrenia (HCC) Diagnosis: Principal Problem:   Schizophrenia (HCC)  Total Time spent with patient: 15 minutes  Past Psychiatric History:  Prior Psychiatric diagnoses: Schizophrenia Past Psychiatric Hospitalizations: Multiple, [facility]    History of self mutilation: None noted Past suicide attempts: Patient reports 1 suicide attempt but "long time ago" but unable to elaborate Past history of HI, violent or aggressive behavior: Per chart review history of violence and agitation towards family members   Past Psychiatric medications trials: Haldol caused EPS and neck stiffness best response  was obtained from Abilify and Abilify Maintena but patient is noncompliant, risperidone and Risperdal Consta were prescribed in the past but unable to confirm if was effective. Patient was most recently discharged on Prolixin oral, Prolixin LAI, Depakote, Ativan, and Ambien   History of ECT/TMS: Patient denies   Outpatient psychiatric Follow up: [facility]  Prior Outpatient Therapy: Unknown  Past Medical History:  Past Medical History:  Diagnosis Date   Psychosis (HCC) 05/2019   [facility]    Seasonal allergies     Past Surgical History:  Procedure Laterality Date   TONSILLECTOMY     Family History:  Family History  Problem Relation Age of Onset   Hypertension Mother    Family Psychiatric  History:  Psychiatric illness: Patient denies Suicide: Patient denies Substance Abuse: Patient denies    Social History:  Social History   Substance and Sexual Activity  Alcohol Use Not Currently   Comment: rare     Social History   Substance and Sexual Activity  Drug Use No    Social History   Socioeconomic History   Marital status: Single    Spouse name: Not on file   Number of children: Not on file   Years of education: Not on file   Highest education level: Not on file  Occupational History   Not on file  Tobacco Use   Smoking status: Every Day    Packs/day: 1.00    Types: Cigarettes   Smokeless tobacco: Former  Building services engineerVaping Use   Vaping Use: Never used  Substance and Sexual Activity   Alcohol use: Not Currently    Comment: rare  Drug use: No   Sexual activity: Not Currently  Other Topics Concern   Not on file  Social History Narrative   Not on file   Social Determinants of Health   Financial Resource Strain: Not on file  Food Insecurity: Unknown (05/01/2022)   Hunger Vital Sign    Worried About Running Out of Food in the Last Year: Patient refused    [place] in the Last Year: Patient refused  Transportation Needs: Unknown (05/01/2022)    [place] - Hydrologist (Medical): Patient refused    Lack of Transportation (Non-Medical): Patient refused  Physical Activity: Not on file  Stress: Not on file  Social Connections: Not on file   Additional Social History:                         Sleep: Fair  Appetite:  Fair  Current Medications: Current Facility-Administered Medications  Medication Dose Route Frequency Provider Last Rate Last Admin   acetaminophen (TYLENOL) tablet 650 mg  650 mg Oral Q6H PRN Motley-Mangrum, Jadeka A, PMHNP       benztropine (COGENTIN) tablet 1 mg  1 mg Oral BID Motley-Mangrum, Jadeka A, PMHNP   1 mg at 05/03/22 0808   divalproex (DEPAKOTE) DR tablet 750 mg  750 mg Oral Q12H Motley-Mangrum, Jadeka A, PMHNP   750 mg at 05/03/22 4166   fluPHENAZine decanoate (PROLIXIN) injection 25 mg  25 mg Intramuscular Q14 Days Lakeisha Waldrop, Ovid Curd, MD   25 mg at 05/03/22 1207   hydrOXYzine (ATARAX) tablet 25 mg  25 mg Oral TID PRN Motley-Mangrum, Jadeka A, PMHNP   25 mg at 05/02/22 2036   LORazepam (ATIVAN) tablet 1 mg  1 mg Oral Once PRN Motley-Mangrum, Jadeka A, PMHNP       magnesium hydroxide (MILK OF MAGNESIA) suspension 30 mL  30 mL Oral Daily PRN Motley-Mangrum, Jadeka A, PMHNP       mirtazapine (REMERON SOL-TAB) disintegrating tablet 15 mg  15 mg Oral QHS Treshawn Allen, Ovid Curd, MD   15 mg at 05/02/22 2036   nicotine polacrilex (NICORETTE) gum 2 mg  2 mg Oral PRN Motley-Mangrum, Jadeka A, PMHNP   2 mg at 05/02/22 1446   OLANZapine (ZYPREXA) injection 10 mg  10 mg Intramuscular BID PRN Shanique Aslinger, Ovid Curd, MD       OLANZapine zydis (ZYPREXA) disintegrating tablet 10 mg  10 mg Oral Once PRN Motley-Mangrum, Donneta Romberg A, PMHNP       traZODone (DESYREL) tablet 50 mg  50 mg Oral QHS PRN Motley-Mangrum, Jadeka A, PMHNP   50 mg at 05/01/22 2023    Lab Results:  Results for orders placed or performed during the hospital encounter of 05/01/22 (from the past 48 hour(s))  Hemoglobin A1c      Status: None   Collection Time: 05/02/22  6:25 AM  Result Value Ref Range   Hgb A1c MFr Bld 5.4 4.8 - 5.6 %    Comment: (NOTE)         Prediabetes: 5.7 - 6.4         Diabetes: >6.4         Glycemic control for adults with diabetes: <7.0    Mean Plasma Glucose 108 mg/dL    Comment: (NOTE) Performed At: [facility] [place], [place] [id number] Rush Farmer MD [id number]   Lipid panel     Status: None   Collection Time: 05/02/22  6:25 AM  Result Value  Ref Range   Cholesterol 162 0 - 200 mg/dL   Triglycerides 71 <150 mg/dL   HDL 52 >40 mg/dL   Total CHOL/HDL Ratio 3.1 RATIO   VLDL 14 0 - 40 mg/dL   LDL Cholesterol 96 0 - 99 mg/dL    Comment:        Total Cholesterol/HDL:CHD Risk Coronary Heart Disease Risk Table                     Men   Women  1/2 Average Risk   3.4   3.3  Average Risk       5.0   4.4  2 X Average Risk   9.6   7.1  3 X Average Risk  23.4   11.0        Use the calculated Patient Ratio above and the CHD Risk Table to determine the patient's CHD Risk.        ATP III CLASSIFICATION (LDL):  <100     mg/dL   Optimal  100-129  mg/dL   Near or Above                    Optimal  130-159  mg/dL   Borderline  160-189  mg/dL   High  >190     mg/dL   Very High Performed at [facility] [address], [place], [place] [address]   TSH     Status: None   Collection Time: 05/02/22  6:25 AM  Result Value Ref Range   TSH 3.062 0.350 - 4.500 uIU/mL    Comment: Performed by a 3rd Generation assay with a functional sensitivity of <=0.01 uIU/mL. Performed at [facility], [place] [address] [place], [place] [address]     Blood Alcohol level:  Lab Results  Component Value Date   [facility] <10 05/01/2022   ETH <10 51/05/5850    Metabolic Disorder Labs: Lab Results  Component Value Date   HGBA1C 5.4 05/02/2022   MPG 108 05/02/2022   MPG 91.06 01/23/2022   Lab Results  Component Value Date   PROLACTIN  26.5 (H) 09/08/2019   Lab Results  Component Value Date   CHOL 162 05/02/2022   TRIG 71 05/02/2022   HDL 52 05/02/2022   CHOLHDL 3.1 05/02/2022   VLDL 14 05/02/2022   LDLCALC 96 05/02/2022   LDLCALC 91 01/23/2022    Physical Findings: AIMS: Facial and Oral Movements Muscles of Facial Expression: None, normal Lips and Perioral Area: None, normal Jaw: None, normal Tongue: None, normal,Extremity Movements Upper (arms, wrists, hands, fingers): None, normal Lower (legs, knees, ankles, toes): None, normal, Trunk Movements Neck, shoulders, hips: None, normal, Overall Severity Severity of abnormal movements (highest score from questions above): None, normal Incapacitation due to abnormal movements: None, normal Patient's awareness of abnormal movements (rate only patient's report): No Awareness, Dental Status Current problems with teeth and/or dentures?: No Does patient usually wear dentures?: No  CIWA:    COWS:     Musculoskeletal: Strength & Muscle Tone: Laying in bed  Gait & Station: Laying in bed  Patient leans: Laying in bed   Psychiatric Specialty Exam:  Presentation  General Appearance:  Laying on ground, blanket over head  Eye Contact: Fair  Speech: Normal Rate  Speech Volume: Normal  Handedness: Right   Mood and Affect  Mood: Anxious  Affect: Full Range   Thought Process  Thought Processes: Goal Directed  Descriptions of Associations:Intact  Orientation:Full (  Time, Place and Person)  Thought Content:Logical  History of Schizophrenia/Schizoaffective disorder:Yes  Duration of Psychotic Symptoms:Greater than six months  Hallucinations:Hallucinations: None  Ideas of Reference:None  Suicidal Thoughts:Suicidal Thoughts: No  Homicidal Thoughts:Homicidal Thoughts: No   Sensorium  Memory: Immediate Good; Recent Good; Remote Good  Judgment: Impaired  Insight: Lacking   Executive Functions  Concentration: Fair  Attention  Span: Poor  Recall: Poor  Fund of Knowledge: Poor  Language: Fair   Psychomotor Activity  Psychomotor Activity: Psychomotor Activity: Normal   Assets  Assets: Social Support; Housing   Sleep  Sleep: Sleep: Fair    Physical Exam: Physical Exam Vitals reviewed.  Constitutional:      General: He is not in acute distress.    Appearance: He is normal weight. He is not toxic-appearing.  Neurological:     Mental Status: He is alert.    Review of Systems  Constitutional:  Negative for chills and fever.  Cardiovascular:  Negative for chest pain and palpitations.  Neurological:  Negative for dizziness, tingling, tremors and headaches.  Psychiatric/Behavioral:  Positive for substance abuse. Negative for depression, hallucinations and suicidal ideas. The patient is nervous/anxious. The patient does not have insomnia.    Blood pressure 113/79, pulse 76, temperature (!) 97.5 F (36.4 C), temperature source Oral, resp. rate 16, height 5\' 5"  (1.651 m), weight 63 kg, SpO2 100 %. Body mass index is 23.13 kg/m.   Treatment Plan Summary: Daily contact with patient to assess and evaluate symptoms and progress in treatment  Expand All Collapse All   Psychiatric Admission Assessment Adult   Patient Identification: Brendan Hogan MRN:  [id number] Date of Evaluation:  05/02/2022 Chief Complaint:  Schizophrenia (HCC) [F20.9] Principal Diagnosis: Schizophrenia (HCC) Diagnosis:  Principal Problem:   Schizophrenia (HCC)   History of Present Illness:    Brendan CossMustafa O Monroy is a 27 y.o., male with a past psychiatric history significant for schizophrenia who was admitted to the psychiatric hospital under IVC for bizarre behaviors, agitation, appearing to respond to internal stimuli.    Prior to admission outpatient psychiatric medications: Father reports patient is receiving Prolixin shot every 2 weeks, is overtaking Ambien 10 mg, is not taking other prescribed medications  mirtazapine and Depakote.   Per Father Brendan Hogan [phone], legal gaurdian: Patient is overusing Ambien "taking it like a street drug.  Taking it 2-3 times at once.  Then goes outside drives.  Per father when the patient goes outside he leaves without shoes, in cold weather, approaching strangers "he is going to hurt himself or others".  Father states "his mind is out" and describes disorganized thinking at home.  The father states the patient has multiple medications at home that he is supposed to take but does not; with the exception of an BM, which the father reports the patient overtakes.  The father states that when the patient takes Ambien, he leaves the house this does not put him to sleep.  Father states that when he does take mirtazapine, is effective for sleep.  Father is interested in rehab treatment for the patient's problem with Ambien.   On my evaluation, the patient is somewhat cooperative.  He is fixated on discharge.  He gives objectively false information, such as stating that he does take his Depakote (although his Depakote level was undetectable on admission) and that he does not use any type of illicit substance or marijuana (he does have a positive THC on UDS).  Taking into account that the patient appears to  be providing information that is not true for her to present himself as having no psychiatric symptoms in order to be discharged, the patient does report the following information: The patient reports that his mood is euthymic.  Reports feeling anxious about being in the hospital.  Reports that his sleep is not good unless he takes Ambien.  Reports not sleeping well last night (nursing documentation that the patient slept 8 hours, without receiving Ambien).  Reports that appetite is within normal limits.  Denies SI.  Denies HI.  Denies AH, VH, any paranoid symptoms.  Denies any ideas of reference, thought control, thought insertion.  He does not seem to have disorganized thinking  during my exam.  He is not manic.    Past psychiatric history: Prior Psychiatric diagnoses: Schizophrenia Past Psychiatric Hospitalizations: Multiple, [facility]    History of self mutilation: None noted Past suicide attempts: Patient reports 1 suicide attempt but "long time ago" but unable to elaborate Past history of HI, violent or aggressive behavior: Per chart review history of violence and agitation towards family members   Past Psychiatric medications trials: Haldol caused EPS and neck stiffness best response was obtained from Abilify and Abilify Maintena but patient is noncompliant, risperidone and Risperdal Consta were prescribed in the past but unable to confirm if was effective. Patient was most recently discharged on Prolixin oral, Prolixin LAI, Depakote, Ativan, and Ambien   History of ECT/TMS: Patient denies   Outpatient psychiatric Follow up: [facility]  Prior Outpatient Therapy: Unknown   Substance Use History  My exam today, the patient denies using any alcohol, having any alcohol problem.  Denies any nicotine, tobacco use.  Denies any illicit substance use including marijuana.   Per previous H&P Sept 2023 substance use history: Alcohol: Patient tells me in a disorganized manner that on 6/24 he was found to be intoxicated and he describes what seems to be a DUI charge but I am unable to confirm such incident, per chart review family reported previously to different staff that patient occasionally drinks alcohol Tobacoo: Smokes 1 pack of cigarette daily Marijuana: Patient denies Cocaine: Patient denies Stimulants: Patient denies IV drug use: Denies Opiates: Denies Prescribed Meds abuse: Denies H/O withdrawals, blackouts, DTs: Denies History of Detox / Rehab: Denies DUI: Patient describes in 6/24 what seems to be a DUI but I am unable to confirm given he presents disorganized and unreliable.   Family Psychiatric History:  Psychiatric illness:  Patient denies Suicide: Patient denies Substance Abuse: Patient denies   Social history: Living situation: Lives with father Social support: Parents Marital Status: Never married Children: No children Education: Unknown Employment: Print production plannerUnknown Military service: Denies Legal history: Has 2 court cases pending per father trauma: Unknown   Total Time spent with patient: 20 minutes       Is the patient at risk to self? Yes.    Has the patient been a risk to self in the past 6 months? Yes.    Has the patient been a risk to self within the distant past? Yes.    Is the patient a risk to others? Yes.    Has the patient been a risk to others in the past 6 months? Yes.    Has the patient been a risk to others within the distant past? Yes.      [place] Scale:  Flowsheet Row [facility]) [facility] Most recent reading at 05/01/2022  3:00 PM ED  from 05/01/2022 in [facility] Most recent reading at 05/01/2022  2:16 AM [facility] Most recent reading at 04/15/2022  8:40 PM  C-SSRS RISK CATEGORY No Risk Error: Q3, 4, or 5 should not be populated when Q2 is No Error: Q3, 4, or 5 should not be populated when Q2 is No           Prior Inpatient Therapy: Yes.   If [facility], [facility]  Prior Outpatient Therapy: Yes.   If yes, describe[facility]     Alcohol Screening: Patient refused Alcohol Screening Tool: Yes Substance Abuse History in the last 12 months:  Yes.   Consequences of Substance Abuse: Negative Previous Psychotropic Medications: Yes  Psychological Evaluations: Yes  Past Medical History:      Past Medical History:  Diagnosis Date   Psychosis (HCC) 05/[facility]    Seasonal allergies           Past Surgical History:  Procedure Laterality Date   TONSILLECTOMY        Family History:       Family History   Problem Relation Age of Onset   Hypertension Mother        Tobacco Screening:  Social History        Tobacco Use  Smoking Status Every Day   Packs/day: 1.00   Types: Cigarettes  Smokeless Tobacco Former    BH Tobacco Counseling       Are you interested in Tobacco Cessation Medications?  Yes, implement Nicotene Replacement Protocol Counseled patient on smoking cessation:  Yes Reason Tobacco Screening Not Completed: No value filed.           Social History:  Social History        Substance and Sexual Activity  Alcohol Use Not Currently    Comment: rare     Social History       Substance and Sexual Activity  Drug Use No    Additional Social History:      Allergies:        Allergies  Allergen Reactions   Haldol [Haloperidol] Other (See Comments)      Muscle spasms- involuntary muscle contractions   Geodon [Ziprasidone] Other (See Comments)      Patient reports that this medicine made him feel bad. He has difficult describing symptoms. He requests not to take anymore.     Lab Results:  Lab Results Last 48 Hours        Results for orders placed or performed during the hospital encounter of 05/01/22 (from the past 48 hour(s))  Lipid panel     Status: None    Collection Time: 05/02/22  6:25 AM  Result Value Ref Range    Cholesterol 162 0 - 200 mg/dL    Triglycerides 71 <409<150 mg/dL    HDL 52 >81>40 mg/dL    Total CHOL/HDL Ratio 3.1 RATIO    VLDL 14 0 - 40 mg/dL    LDL Cholesterol 96 0 - 99 mg/dL      Comment:        Total Cholesterol/HDL:CHD Risk Coronary Heart Disease Risk Table                     Men   Women  1/2 Average Risk   3.4   3.3  Average Risk       5.0   4.4  2  X Average Risk   9.6   7.1  3 X Average Risk  23.4   11.0        Use the calculated Patient Ratio above and the CHD Risk Table to determine the patient's CHD Risk.        ATP III CLASSIFICATION (LDL):  <100     mg/dL   Optimal  154-008  mg/dL   Near or Above                     Optimal  130-159  mg/dL   Borderline  676-195  mg/dL   High  >093     mg/dL   Very High Performed at [facility], [address] [place], [place] [address]    TSH     Status: None    Collection Time: 05/02/22  6:25 AM  Result Value Ref Range    TSH 3.062 0.350 - 4.500 uIU/mL      Comment: Performed by a 3rd Generation assay with a functional sensitivity of <=0.01 uIU/mL. Performed at [facility], [address], [place], [place] [address]          Blood Alcohol level:  Recent Labs       Lab Results  Component Value Date    ETH <10 05/01/2022    ETH <10 03/11/2022        Metabolic Disorder Labs:  Recent Labs       Lab Results  Component Value Date    HGBA1C 4.8 01/23/2022    MPG 91.06 01/23/2022    MPG 100 05/19/2021      Recent Labs       Lab Results  Component Value Date    PROLACTIN 26.5 (H) 09/08/2019      Recent Labs       Lab Results  Component Value Date    CHOL 162 05/02/2022    TRIG 71 05/02/2022    HDL 52 05/02/2022    CHOLHDL 3.1 05/02/2022    VLDL 14 05/02/2022    LDLCALC 96 05/02/2022    LDLCALC 91 01/23/2022        Current Medications:          Current Facility-Administered Medications  Medication Dose Route Frequency Provider Last Rate Last Admin   acetaminophen (TYLENOL) tablet 650 mg  650 mg Oral Q6H PRN Motley-Mangrum, Jadeka A, PMHNP       benztropine (COGENTIN) tablet 1 mg  1 mg Oral BID Motley-Mangrum, Jadeka A, PMHNP   1 mg at 05/02/22 1106   divalproex (DEPAKOTE) DR tablet 750 mg  750 mg Oral Q12H Motley-Mangrum, Jadeka A, PMHNP   750 mg at 05/02/22 1106   hydrOXYzine (ATARAX) tablet 25 mg  25 mg Oral TID PRN Motley-Mangrum, Jadeka A, PMHNP   25 mg at 05/01/22 2023   LORazepam (ATIVAN) tablet 1 mg  1 mg Oral Once PRN Motley-Mangrum, Jadeka A, PMHNP       magnesium hydroxide (MILK OF MAGNESIA) suspension 30 mL  30 mL Oral Daily PRN Motley-Mangrum, Jadeka A, PMHNP       nicotine polacrilex  (NICORETTE) gum 2 mg  2 mg Oral PRN Motley-Mangrum, Jadeka A, PMHNP   2 mg at 05/02/22 1106   OLANZapine (ZYPREXA) injection 10 mg  10 mg Intramuscular BID PRN Easton Fetty, Harrold Donath, MD       OLANZapine zydis (ZYPREXA) disintegrating tablet 10 mg  10 mg Oral Once PRN Motley-Mangrum, Ezra Sites, PMHNP  traZODone (DESYREL) tablet 50 mg  50 mg Oral QHS PRN Motley-Mangrum, Jadeka A, PMHNP   50 mg at 05/01/22 2023    PTA Medications:        Medications Prior to Admission  Medication Sig Dispense Refill Last Dose   divalproex (DEPAKOTE ER) 500 MG 24 hr tablet Take 1,000 mg by mouth daily.         mirtazapine (REMERON) 15 MG tablet Take 15 mg by mouth at bedtime.         zolpidem (AMBIEN) 10 MG tablet Take 10 mg by mouth at bedtime.         benztropine (COGENTIN) 1 MG tablet Take 1 tablet (1 mg total) by mouth 2 (two) times daily. 60 tablet 3     cloNIDine (CATAPRES) 0.1 MG tablet Take 1 tablet (0.1 mg total) by mouth 2 (two) times daily. 60 tablet 3     fluPHENAZine decanoate (PROLIXIN) 25 MG/ML injection Inject 1 mL (25 mg total) into the muscle every 21 ( twenty-one) days. NEXT ADMINISTRATION IS DUE ON 02-25-2022. 5 mL 11        Musculoskeletal: Strength & Muscle Tone: Laying in bed  Gait & Station: Laying in bed  Patient leans: Laying in bed                          Psychiatric Specialty Exam:   Presentation  General Appearance:  Disheveled   Eye Contact: Fair   Speech: Normal Rate   Speech Volume: Normal   Handedness: Right     Mood and Affect  Mood: Anxious   Affect: Full Range     Thought Process  Thought Processes: Goal Directed   Duration of Psychotic Symptoms:N/A Past Diagnosis of Schizophrenia or Psychoactive disorder: Yes   Descriptions of Associations:Intact   Orientation:Full (Time, Place and Person)   Thought Content:Logical   Hallucinations:Hallucinations: None   Ideas of Reference:None   Suicidal Thoughts:Suicidal Thoughts: No    Homicidal Thoughts:Homicidal Thoughts: No     Sensorium  Memory: Immediate Good; Recent Good; Remote Good   Judgment: Impaired   Insight: Lacking     Executive Functions  Concentration: Fair   Attention Span: Poor   Recall: Poor   Fund of Knowledge: Poor   Language: Fair     Psychomotor Activity  Psychomotor Activity: Psychomotor Activity: Normal     Assets  Assets: Social Support; Housing     Sleep  Sleep: Sleep: Fair       Physical Exam: Physical Exam Vitals reviewed.  Constitutional:      General: He is not in acute distress.    Appearance: He is normal weight. He is not toxic-appearing.  Neurological:     Mental Status: He is alert.      Review of Systems  Psychiatric/Behavioral:  Positive for substance abuse. The patient is nervous/anxious.     Blood pressure 107/73, pulse (!) 106, temperature (!) 97.5 F (36.4 C), temperature source Oral, resp. rate 16, height 5\' 5"  (1.651 m), weight 63 kg, SpO2 100 %. Body mass index is 23.13 kg/m.   Treatment Plan Summary: Daily contact with patient to assess and evaluate symptoms and progress in treatment     ASSESSMENT:   Diagnoses / Active Problems: -Schizophrenia    PLAN: Safety and Monitoring:             -- Involuntary admission to inpatient psychiatric unit for safety, stabilization and treatment             --  Daily contact with patient to assess and evaluate symptoms and progress in treatment             -- Patient's case to be discussed in multi-disciplinary team meeting             -- Observation Level : q15 minute checks             -- Vital signs:  q12 hours             -- Precautions: suicide, elopement, and assault   2. Psychiatric Diagnoses and Treatment:               -Order Prolixin 25 mg IM LAI today 05-03-22 - need to confirm last adminsitration, the give 25 mg q2weeks from last dose.  -Continue depakote 750 mg bid for mood stabilization - previously effective fro  stabilization plus prolix during last admission. Non adherent at home  -Continue cogentin 1 mg bid for eps ppx  -Stop ambien -Continue remeron  -Continue zyprexa oral and IM PRN for agitation    --  The risks/benefits/side-effects/alternatives to this medication were discussed in detail with the patient and time was given for questions. The patient consents to medication trial.                -- Metabolic profile and EKG monitoring obtained while on an atypical antipsychotic (BMI: Lipid Panel: HbgA1c: QTc:)              -- Encouraged patient to participate in unit milieu and in scheduled group therapies                          3. Medical Issues Being Addressed:              Tobacco Use Disorder             -- Nicotine gum PRN              -- Smoking cessation encouraged   4. Discharge Planning:              -- Social work and case management to assist with discharge planning and identification of hospital follow-up needs prior to discharge             -- Estimated LOS: 5-7 days             -- Discharge Concerns: Need to establish a safety plan; Medication compliance and effectiveness             -- Discharge Goals: Return home with outpatient referrals for mental health follow-up including medication management/psychotherapy      Cristy Hilts, MD 05/03/2022, 2:25 PM  Total Time Spent in Direct Patient Care:  I personally spent 35 minutes on the unit in direct patient care. The direct patient care time included face-to-face time with the patient, reviewing the patient's chart, communicating with other professionals, and coordinating care. Greater than 50% of this time was spent in counseling or coordinating care with the patient regarding goals of hospitalization, psycho-education, and discharge planning needs.   Phineas Inches, MD Psychiatrist

## 2022-05-03 NOTE — Progress Notes (Signed)
The focus of this group is to help patients review their daily goal of treatment and discuss progress on daily workbooks. Pt did not attend the evening group. 

## 2022-05-03 NOTE — BHH Counselor (Signed)
Adult Comprehensive Assessment  Patient ID: Brendan Hogan, male   DOB: 27-Nov-1995, 27 y.o.   MRN: [id number]  Information Source: Information source: Patient  Current Stressors:  Patient states their primary concerns and needs for treatment are:: Patient states that people feel like he needs rehab Patient states their goals for this hospitilization and ongoing recovery are:: "I guess go to a rehab" Educational / Learning stressors: no stressors Employment / Job issues: unemployed Family Relationships: "okayPublishing copy / Lack of resources (include bankruptcy): poor financially Housing / Lack of housing: patient states that he lives with mother and father Physical health (include injuries & life threatening diseases): "okay" Social relationships: no stressors Substance abuse: patient states he has been Environmental education officer / Loss: no stressors  Living/Environment/Situation:  Living Arrangements: Parent Living conditions (as described by patient or guardian): comfortable Who else lives in the home?: mom and dad How long has patient lived in current situation?: whole life What is atmosphere in current home: Comfortable, Supportive  Family History:  Marital status: Single Are you sexually active?: No What is your sexual orientation?: Per patient, "heterosexual" Has your sexual activity been affected by drugs, alcohol, medication, or emotional stress?: Denies Does patient have children?: No  Childhood History:  By whom was/is the patient raised?: Both parents Additional childhood history information: patient states that his childhood was "good" Description of patient's relationship with caregiver when they were a child: Per patient, relationship with mom was "normal", relationship with dad was "okay." Patient's description of current relationship with people who raised him/her: "good" How were you disciplined when you got in trouble as a child/adolescent?: Per patient, "I  never understood discipline." Does patient have siblings?: No Did patient suffer any verbal/emotional/physical/sexual abuse as a child?: No Did patient suffer from severe childhood neglect?: No Has patient ever been sexually abused/assaulted/raped as an adolescent or adult?: No Was the patient ever a victim of a crime or a disaster?: No Witnessed domestic violence?: No Has patient been affected by domestic violence as an adult?: No  Education:  Highest grade of school patient has completed: some college Currently a Ship broker?: No Learning disability?: No  Employment/Work Situation:   Employment Situation: Unemployed Work Stressors: Patient  seeking employment. Patient's Job has Been Impacted by Current Illness: No Has Patient ever Been in the [employer]?: No  Financial Resources:   Financial resources: Income from employment Does patient have a representative payee or guardian?: No  Alcohol/Substance Abuse:   What has been your use of drugs/alcohol within the last 12 months?: ambien If attempted suicide, did drugs/alcohol play a role in this?: No Alcohol/Substance Abuse Treatment Hx: Denies past history Has alcohol/substance abuse ever caused legal problems?: No  Social Support System:   Pensions consultant Support System: Fair Astronomer System: mom and dad  Leisure/Recreation:   Do You Have Hobbies?: Yes Leisure and Hobbies: Basketball  Strengths/Needs:   Patient states these barriers may affect/interfere with their treatment: substance use, med compliance  Discharge Plan:   Currently receiving community mental health services: Yes (From Whom) ([place]) Patient states concerns and preferences for aftercare planning are: none Patient states they will know when they are safe and ready for discharge when: patient states that he is ready to go Does patient have access to transportation?: Yes Does patient have financial barriers related to discharge  medications?: No Patient description of barriers related to discharge medications: none Plan for living situation after discharge: rehab Will patient be returning to same living  situation after discharge?: No  Summary/Recommendations:   Summary and Recommendations (to be completed by the evaluator): Brendan Hogan is 27 year old male who was admitted to [facility] for bizarre behavior.  Patient admits to overtaking [place].  Patient has a past psychiatric history of schizophrenia. Father is his guardian.  Patient and father are seeking rehab for [place] use. Patient endorses poor sleep.  Patient has been connected with [facility] and has been getting prolixin shot every 2 weeks. While here, [name] can benefit from crisis stabilization, medication management, therapeutic milieu, and referrals for services.   Brendan Hogan. 05/03/2022

## 2022-05-03 NOTE — Progress Notes (Signed)
   05/03/22 0558  15 Minute Checks  Location Bedroom  Visual Appearance Calm  Behavior Sleeping  Sleep (Behavioral Health Patients Only)  Calculate sleep? (Click Yes once per 24 hr at 0600 safety check) Yes  Documented sleep last 24 hours 9.25

## 2022-05-03 NOTE — Group Note (Signed)
Recreation Therapy Group Note   Group Topic:Team Building  Group Date: 05/03/2022 Start Time: 1000 End Time: 1057 Facilitators: Darolyn Double-McCall, LRT,CTRS Location: 500 Hall Dayroom   Goal Area(s) Addresses:  Patient will effectively work with peer towards shared goal.  Patient will identify skills used to make activity successful.  Patient will identify how skills used during activity can be used to reach post d/c goals.   Group Description: Straw Bridge. In teams of 3-5, patients were given 15 plastic drinking straws and an equal length of masking tape. Using the materials provided, patients were instructed to build a free standing bridge-like structure to suspend an everyday item (ex: puzzle box) off of the floor or table surface. All materials were required to be used by the team in their design. LRT facilitated post-activity discussion reviewing team process. Patients were encouraged to reflect how the skills used in this activity can be generalized to daily life post discharge.    Affect/Mood: N/A   Participation Level: Did not attend    Clinical Observations/Individualized Feedback:     Plan: Continue to engage patient in RT group sessions 2-3x/week.   Macarius Ruark-McCall, LRT,CTRS  05/03/2022 12:12 PM

## 2022-05-03 NOTE — BH IP Treatment Plan (Signed)
Interdisciplinary Treatment and Diagnostic Plan Update  05/03/2022 Time of Session: 1136 Brendan Hogan MRN: [id number]  Principal Diagnosis: Schizophrenia [facility])  Secondary Diagnoses: Principal Problem:   Schizophrenia (HCC)   Current Medications:  Current Facility-Administered Medications  Medication Dose Route Frequency Provider Last Rate Last Admin   acetaminophen (TYLENOL) tablet 650 mg  650 mg Oral Q6H PRN Motley-Mangrum, Jadeka A, PMHNP       benztropine (COGENTIN) tablet 1 mg  1 mg Oral BID Motley-Mangrum, Jadeka A, PMHNP   1 mg at 05/03/22 0808   divalproex (DEPAKOTE) DR tablet 750 mg  750 mg Oral Q12H Motley-Mangrum, Jadeka A, PMHNP   750 mg at 05/03/22 5009   fluPHENAZine decanoate (PROLIXIN) injection 25 mg  25 mg Intramuscular Q14 Days Massengill, Harrold Donath, MD   25 mg at 05/03/22 1207   hydrOXYzine (ATARAX) tablet 25 mg  25 mg Oral TID PRN Motley-Mangrum, Jadeka A, PMHNP   25 mg at 05/02/22 2036   LORazepam (ATIVAN) tablet 1 mg  1 mg Oral Once PRN Motley-Mangrum, Jadeka A, PMHNP       magnesium hydroxide (MILK OF MAGNESIA) suspension 30 mL  30 mL Oral Daily PRN Motley-Mangrum, Jadeka A, PMHNP       mirtazapine (REMERON SOL-TAB) disintegrating tablet 15 mg  15 mg Oral QHS Massengill, Harrold Donath, MD   15 mg at 05/02/22 2036   nicotine polacrilex (NICORETTE) gum 2 mg  2 mg Oral PRN Motley-Mangrum, Jadeka A, PMHNP   2 mg at 05/02/22 1446   OLANZapine (ZYPREXA) injection 10 mg  10 mg Intramuscular BID PRN Massengill, Harrold Donath, MD       OLANZapine zydis (ZYPREXA) disintegrating tablet 10 mg  10 mg Oral Once PRN Motley-Mangrum, Jadeka A, PMHNP       traZODone (DESYREL) tablet 50 mg  50 mg Oral QHS PRN Motley-Mangrum, Jadeka A, PMHNP   50 mg at 05/01/22 2023   PTA Medications: Medications Prior to Admission  Medication Sig Dispense Refill Last Dose   divalproex (DEPAKOTE ER) 500 MG 24 hr tablet Take 1,000 mg by mouth daily.      mirtazapine (REMERON) 15 MG tablet Take 15 mg by mouth at  bedtime.      zolpidem (AMBIEN) 10 MG tablet Take 10 mg by mouth at bedtime.      benztropine (COGENTIN) 1 MG tablet Take 1 tablet (1 mg total) by mouth 2 (two) times daily. 60 tablet 3    cloNIDine (CATAPRES) 0.1 MG tablet Take 1 tablet (0.1 mg total) by mouth 2 (two) times daily. 60 tablet 3    fluPHENAZine decanoate (PROLIXIN) 25 MG/ML injection Inject 1 mL (25 mg total) into the muscle every 21 ( twenty-one) days. NEXT ADMINISTRATION IS DUE ON 02-25-2022. 5 mL 11     Patient Stressors: Marital or family conflict   Medication change or noncompliance    Patient Strengths: Manufacturing systems engineer  Supportive family/friends   Treatment Modalities: Medication Management, Group therapy, Case management,  1 to 1 session with clinician, Psychoeducation, Recreational therapy.   Physician Treatment Plan for Primary Diagnosis: Schizophrenia (HCC) Long Term Goal(s): Improvement in symptoms so as ready for discharge   Short Term Goals: Ability to identify changes in lifestyle to reduce recurrence of condition will improve Ability to verbalize feelings will improve Ability to disclose and discuss suicidal ideas Ability to demonstrate self-control will improve Ability to identify and develop effective coping behaviors will improve Ability to maintain clinical measurements within normal limits will improve Compliance with prescribed medications will  improve Ability to identify triggers associated with substance abuse/mental health issues will improve  Medication Management: Evaluate patient's response, side effects, and tolerance of medication regimen.  Therapeutic Interventions: 1 to 1 sessions, Unit Group sessions and Medication administration.  Evaluation of Outcomes: Not Met  Physician Treatment Plan for Secondary Diagnosis: Principal Problem:   Schizophrenia ([place])  Long Term Goal(s): Improvement in symptoms so as ready for discharge   Short Term Goals: Ability to identify changes in  lifestyle to reduce recurrence of condition will improve Ability to verbalize feelings will improve Ability to disclose and discuss suicidal ideas Ability to demonstrate self-control will improve Ability to identify and develop effective coping behaviors will improve Ability to maintain clinical measurements within normal limits will improve Compliance with prescribed medications will improve Ability to identify triggers associated with substance abuse/mental health issues will improve     Medication Management: Evaluate patient's response, side effects, and tolerance of medication regimen.  Therapeutic Interventions: 1 to 1 sessions, Unit Group sessions and Medication administration.  Evaluation of Outcomes: Not Met   RN Treatment Plan for Primary Diagnosis: Schizophrenia ([place]) Long Term Goal(s): Knowledge of disease and therapeutic regimen to maintain health will improve  Short Term Goals: Ability to remain free from injury will improve, Ability to verbalize frustration and anger appropriately will improve, Ability to demonstrate self-control, Ability to participate in decision making will improve, Ability to verbalize feelings will improve, Ability to disclose and discuss suicidal ideas, Ability to identify and develop effective coping behaviors will improve, and Compliance with prescribed medications will improve  Medication Management: RN will administer medications as ordered by provider, will assess and evaluate patient's response and provide education to patient for prescribed medication. RN will report any adverse and/or side effects to prescribing provider.  Therapeutic Interventions: 1 on 1 counseling sessions, Psychoeducation, Medication administration, Evaluate responses to treatment, Monitor vital signs and CBGs as ordered, Perform/monitor CIWA, COWS, AIMS and Fall Risk screenings as ordered, Perform wound care treatments as ordered.  Evaluation of Outcomes: Not Met   LCSW  Treatment Plan for Primary Diagnosis: Schizophrenia ([place]) Long Term Goal(s): Safe transition to appropriate next level of care at discharge, Engage patient in therapeutic group addressing interpersonal concerns.  Short Term Goals: Engage patient in aftercare planning with referrals and resources, Increase social support, Increase ability to appropriately verbalize feelings, Increase emotional regulation, Facilitate acceptance of mental health diagnosis and concerns, Facilitate patient progression through stages of change regarding substance use diagnoses and concerns, Identify triggers associated with mental health/substance abuse issues, and Increase skills for wellness and recovery  Therapeutic Interventions: Assess for all discharge needs, 1 to 1 time with Social worker, Explore available resources and support systems, Assess for adequacy in community support network, Educate family and significant other(s) on suicide prevention, Complete Psychosocial Assessment, Interpersonal group therapy.  Evaluation of Outcomes: Not Met   Progress in Treatment: Attending groups: No. Participating in groups: No. Taking medication as prescribed: Yes. Toleration medication: Yes. Family/Significant other contact made: No, Does not consent Patient understands diagnosis: No. Discussing patient identified problems/goals with staff: Yes. Medical problems stabilized or resolved: No. Denies suicidal/homicidal ideation: Yes. Issues/concerns per patient self-inventory: Yes. Other:   New problem(s) identified: No, Describe:  none reported  New Short Term/Long Term Goal(s): Medication Stabilization  Patient Goals:  "I want to go home"  Discharge Plan or Barriers: None Reported  Reason for Continuation of Hospitalization: Delusions  Hallucinations Medication stabilization Other; describe Insomnia  Estimated Length of Stay: 3-7 Days  Last 3 [place] Suicide Severity Risk Score: Flowsheet Row Admission  (Current) from 05/01/2022 in [place] [facility] Most recent reading at 05/01/2022  3:00 PM ED from 05/01/2022 in [place] [facility]PT Most recent reading at 05/01/2022  2:16 AM ED from 04/15/2022 in [place] Most recent reading at 04/15/2022  8:40 PM  C-SSRS RISK CATEGORY No Risk Error: Q3, 4, or 5 should not be populated when Q2 is No Error: Q3, 4, or 5 should not be populated when Q2 is No       Last PHQ 2/9 Scores:    02/18/2022   12:20 PM 07/19/2020    3:12 PM 05/29/2020    7:35 AM  Depression screen PHQ 2/9  Decreased Interest 0 1 0  Down, Depressed, Hopeless 0 1 1  PHQ - 2 Score 0 2 1  Altered sleeping 0 2   Tired, decreased energy 0 1   Change in appetite 0 1   Feeling bad or failure about yourself  0 1   Trouble concentrating 0 1   Moving slowly or fidgety/restless 0 0   Suicidal thoughts 0 0   PHQ-9 Score 0 8   Difficult doing work/chores Not difficult at all       medication stabilization, elimination of SI thoughts, development of comprehensive mental wellness plan.   Scribe for Treatment Team: Windle Guard, LCSW 05/03/2022 12:54 PM

## 2022-05-03 NOTE — Group Note (Signed)
LCSW Group Therapy Note   Group Date: 05/03/2022 Start Time: 1100 End Time: 1200    Type of Therapy and Topic:  Group Therapy:  Safe Spaces    Participation Level: Did Not Attend     Description of Group:   In this group patients will be encouraged to explore there safe spaces whether if it is a person, place, or activity. They will be guided to discuss their thoughts, feelings, and behaviors while being in their safe space. The group will process together ways to identify, explore, and analyze positive aspects within their safe space. Each patient will be challenged to identify what is consider a safe space to them. This group will be process-oriented, with patients participating in exploration of their own safe spaces as well as giving and receiving support and challenge from other group members.   Therapeutic Goals: 1.         Patient will identify person, place, and activity for safe space. 2.         Patient will identify what they would bring to there safe space.  3.         Patient will identify feelings, thought process and emotions while being in there safe space. 4.         Patient will identify what there safe place is , if safe space no longer exist.      Summary of Patient Progress  Did Not Attend      Therapeutic Modalities:   Solution Focused Therapy Motivational Interviewing Relapse Therapy      Sherre Lain, LCSWA 05/03/2022  1:39 PM

## 2022-05-03 NOTE — Progress Notes (Signed)
   05/03/22 2000  Psych Admission Type (Psych Patients Only)  Admission Status Involuntary  Psychosocial Assessment  Patient Complaints Suspiciousness  Eye Contact Fair  Facial Expression Anxious  Affect Anxious;Preoccupied  Speech Logical/coherent  Interaction Cautious;Assertive  Motor Activity Slow  Appearance/Hygiene Disheveled  Behavior Characteristics Cooperative  Mood Suspicious  Aggressive Behavior  Effect No apparent injury  Thought Process  Coherency Circumstantial  Content Preoccupation  Delusions WDL  Perception WDL  Hallucination None reported or observed  Judgment Poor  Confusion WDL  Danger to Self  Current suicidal ideation? Denies  Danger to Others  Danger to Others None reported or observed

## 2022-05-04 DIAGNOSIS — F201 Disorganized schizophrenia: Secondary | ICD-10-CM | POA: Diagnosis not present

## 2022-05-04 NOTE — Progress Notes (Signed)
Pt is A&OX4, calm, isolative, denies suicidal ideations, denies homicidal ideations, denies auditory hallucinations and denies visual hallucinations. Pt verbally agrees to approach staff if these become apparent and before harming self or others. Pt denies experiencing nightmares. Mood and affect are congruent. Pt appetite is ok. No complaints of anxiety, distress, pain and/or discomfort at this time. Pt's memory appears to be grossly intact, and Pt hasn't displayed any injurious behaviors. Pt is medication compliant. There's no evidence of suicidal intent. Psychomotor activity was WNL. No s/s of Parkinson, Dystonia, Akathisia and/or Tardive Dyskinesia noted.

## 2022-05-04 NOTE — Progress Notes (Signed)
   05/04/22 0600  15 Minute Checks  Location Bedroom  Visual Appearance Calm  Behavior Composed  Sleep (Behavioral Health Patients Only)  Calculate sleep? (Click Yes once per 24 hr at 0600 safety check) Yes  Documented sleep last 24 hours 9.5

## 2022-05-04 NOTE — Group Note (Signed)
BHH/BM[facility] LCSW Group Therapy Note   Date/Time:  05/04/2022 11:15AM-12:00PM   Type of Therapy and Topic:  Group Therapy:  Feelings About Hospitalization   Participation Level:  Did Not Attend    Description of Group This process group involved patients discussing their feelings related to being hospitalized, as well as the benefits they see to being in the hospital.  These feelings and benefits were itemized.  The group then brainstormed specific ways in which they could seek those same benefits when they discharge and return home.   Therapeutic Goals Patient will identify and describe positive and negative feelings related to hospitalization Patient will verbalize benefits of hospitalization to themselves personally Patients will brainstorm together ways they can obtain similar benefits in the outpatient setting, identify barriers to wellness and possible solutions   Summary of Patient Progress:  N/A   Therapeutic Modalities Cognitive Behavioral Therapy Motivational Interviewing       Dejuan Elman Grossman-Orr, LCSW 05/04/2022, 11:52 AM  

## 2022-05-04 NOTE — [facility] Group Notes (Signed)
Pt did not attend wrap-up group   

## 2022-05-04 NOTE — BHH Group Notes (Signed)
Goals Group 05/04/22   Group Focus: affirmation, clarity of thought, and goals/reality orientation Treatment Modality:  Psychoeducation Interventions utilized were assignment, group exercise, and support Purpose: To be able to understand and verbalize the reason for their admission to the hospital. To understand that the medication helps with their chemical imbalance but they also need to work on their choices in life. To be challenged to develop a list of 30 positives about themselves. Also introduce the concept that "feelings" are not reality.  Participation Level:  did not attend  Brendan Hogan A 

## 2022-05-04 NOTE — Progress Notes (Signed)
Adult Psychoeducational Group Note  Date:  05/04/2022 Time:  10:52 AM  Group Topic/Focus:  Goals Group:   The focus of this group is to help patients establish daily goals to achieve during treatment and discuss how the patient can incorporate goal setting into their daily lives to aide in recovery.  Participation Level:  Did Not Attend  Participation Quality:   n/a  Affect:   n/a  Cognitive:   n/a  Insight: None  Engagement in Group:   n/a  Modes of Intervention:   n/a  Additional Comments:    Pt did not attend the morning goals group.  Brendan Hogan 05/04/2022, 10:52 AM

## 2022-05-04 NOTE — Progress Notes (Signed)
[facility] MD Progress Note  05/04/2022 3:30 PM Brendan Hogan  MRN:  [id number]  Subjective:    Brendan Hogan is a 27 y.o., male with a past psychiatric history significant for schizophrenia who was admitted to the psychiatric hospital under IVC for bizarre behaviors, agitation, appearing to respond to internal stimuli.    On admission, the following recommendations were made: -Continue Prolixin LAI - need to confirm last adminsitration, the give 25 mg q2weeks from last dose.  -Restart depakote 750 mg bid for mood stabilization - previously effective fro stabilization plus prolix during last admission. Non adherent at home  -Restart cogentin 1 mg bid for eps ppx  -Stop ambien -Restart remeron  -start zyprexa oral and IM PRN for agitation   On evaluation today, the pt does not appear psychotic or responding to internal stimuli. Reports mood is stable. Denying SI, HI. Denying AH, VH, paranoia.  Perseverant on discharge and asking for ambien. Slept well w/o ambien for past 2 nights per patient and nursing. Patient also advised that because dad is legal guardian, we have to contact dad to discuss discharge planning.  Patient voices understanding.  Patient reports that he is improved from admission, as he is no longer trying to fight others and is better able to control his mood and emotions.  Denying s/e to current meds.   Per nursing, pt trying to take medications.    Principal Problem: Schizophrenia (HCC) Diagnosis: Principal Problem:   Schizophrenia (HCC)  Total Time spent with patient: 15 minutes  Past Psychiatric History:  Prior Psychiatric diagnoses: Schizophrenia Past Psychiatric Hospitalizations: Multiple, last hospitalization September 2023 at [facility]    History of self mutilation: None noted Past suicide attempts: Patient reports 1 suicide attempt but "long time ago" but unable to elaborate Past history of HI, violent or aggressive behavior: Per chart review history of violence  and agitation towards family members   Past Psychiatric medications trials: Haldol caused EPS and neck stiffness best response was obtained from Abilify and Abilify Maintena but patient is noncompliant, risperidone and Risperdal Consta were prescribed in the past but unable to confirm if was effective. Patient was most recently discharged on Prolixin oral, Prolixin LAI, Depakote, Ativan, and Ambien   History of ECT/TMS: Patient denies   Outpatient psychiatric Follow up: [facility]  Prior Outpatient Therapy: Unknown  Past Medical History:  Past Medical History:  Diagnosis Date   Psychosis (HCC) 05/2019   [facility] -     Seasonal allergies     Past Surgical History:  Procedure Laterality Date   TONSILLECTOMY     Family History:  Family History  Problem Relation Age of Onset   Hypertension Mother    Family Psychiatric  History:  Psychiatric illness: Patient denies Suicide: Patient denies Substance Abuse: Patient denies    Social History:  Social History   Substance and Sexual Activity  Alcohol Use Not Currently   Comment: rare     Social History   Substance and Sexual Activity  Drug Use No    Social History   Socioeconomic History   Marital status: Single    Spouse name: Not on file   Number of children: Not on file   Years of education: Not on file   Highest education level: Not on file  Occupational History   Not on file  Tobacco Use   Smoking status: Every Day    Packs/day: 1.00    Types: Cigarettes   Smokeless tobacco: Former  Advertising account planner  Vaping Use: Never used  Substance and Sexual Activity   Alcohol use: Not Currently    Comment: rare   Drug use: No   Sexual activity: Not Currently  Other Topics Concern   Not on file  Social History Narrative   Not on file   Social Determinants of Health   Financial Resource Strain: Not on file  Food Insecurity: Unknown (05/01/2022)   Hunger Vital Sign    Worried About Running Out of Food in the  Last Year: Patient refused    Ran Out of Food in the Last Year: Patient refused  Transportation Needs: Unknown (05/01/2022)   PRAPARE - Administrator, Civil Service (Medical): Patient refused    Lack of Transportation (Non-Medical): Patient refused  Physical Activity: Not on file  Stress: Not on file  Social Connections: Not on file   Additional Social History:                         Sleep: Fair  Appetite:  Fair  Current Medications: Current Facility-Administered Medications  Medication Dose Route Frequency Provider Last Rate Last Admin   acetaminophen (TYLENOL) tablet 650 mg  650 mg Oral Q6H PRN Motley-Mangrum, Jadeka A, PMHNP       benztropine (COGENTIN) tablet 1 mg  1 mg Oral BID Motley-Mangrum, Jadeka A, PMHNP   1 mg at 05/04/22 0806   divalproex (DEPAKOTE) DR tablet 750 mg  750 mg Oral Q12H Motley-Mangrum, Jadeka A, PMHNP   750 mg at 05/04/22 5993   fluPHENAZine decanoate (PROLIXIN) injection 25 mg  25 mg Intramuscular Q14 Days Massengill, Harrold Donath, MD   25 mg at 05/03/22 1207   hydrOXYzine (ATARAX) tablet 25 mg  25 mg Oral TID PRN Motley-Mangrum, Jadeka A, PMHNP   25 mg at 05/03/22 2035   LORazepam (ATIVAN) tablet 1 mg  1 mg Oral Once PRN Motley-Mangrum, Jadeka A, PMHNP       magnesium hydroxide (MILK OF MAGNESIA) suspension 30 mL  30 mL Oral Daily PRN Motley-Mangrum, Jadeka A, PMHNP       mirtazapine (REMERON SOL-TAB) disintegrating tablet 15 mg  15 mg Oral QHS Massengill, Harrold Donath, MD   15 mg at 05/03/22 2035   nicotine polacrilex (NICORETTE) gum 2 mg  2 mg Oral PRN Motley-Mangrum, Jadeka A, PMHNP   2 mg at 05/03/22 1621   OLANZapine (ZYPREXA) injection 10 mg  10 mg Intramuscular BID PRN Massengill, Harrold Donath, MD       OLANZapine zydis (ZYPREXA) disintegrating tablet 10 mg  10 mg Oral Once PRN Motley-Mangrum, Jadeka A, PMHNP       traZODone (DESYREL) tablet 50 mg  50 mg Oral QHS PRN Motley-Mangrum, Jadeka A, PMHNP   50 mg at 05/03/22 2035    Lab Results:  No  results found for this or any previous visit (from the past 48 hour(s)).   Blood Alcohol level:  Lab Results  Component Value Date   ETH <10 05/01/2022   ETH <10 03/11/2022    Metabolic Disorder Labs: Lab Results  Component Value Date   HGBA1C 5.4 05/02/2022   MPG 108 05/02/2022   MPG 91.06 01/23/2022   Lab Results  Component Value Date   PROLACTIN 26.5 (H) 09/08/2019   Lab Results  Component Value Date   CHOL 162 05/02/2022   TRIG 71 05/02/2022   HDL 52 05/02/2022   CHOLHDL 3.1 05/02/2022   VLDL 14 05/02/2022   LDLCALC 96 05/02/2022   LDLCALC 91  01/23/2022    Physical Findings: AIMS: Facial and Oral Movements Muscles of Facial Expression: None, normal Lips and Perioral Area: None, normal Jaw: None, normal Tongue: None, normal,Extremity Movements Upper (arms, wrists, hands, fingers): None, normal Lower (legs, knees, ankles, toes): None, normal, Trunk Movements Neck, shoulders, hips: None, normal, Overall Severity Severity of abnormal movements (highest score from questions above): None, normal Incapacitation due to abnormal movements: None, normal Patient's awareness of abnormal movements (rate only patient's report): No Awareness, Dental Status Current problems with teeth and/or dentures?: No Does patient usually wear dentures?: No  CIWA:    COWS:     Musculoskeletal: Strength & Muscle Tone: Laying in bed  Gait & Station: Laying in bed  Patient leans: Laying in bed   Psychiatric Specialty Exam:  Presentation  General Appearance:  Laying on ground, blanket over head  Eye Contact: Good  Speech: Clear and Coherent; Normal Rate  Speech Volume: Normal  Handedness: Right   Mood and Affect  Mood: Euthymic  Affect: Congruent   Thought Process  Thought Processes: Goal Directed  Descriptions of Associations:Intact  Orientation:Full (Time, Place and Person)  Thought Content:Logical; WDL  History of Schizophrenia/Schizoaffective  disorder:Yes  Duration of Psychotic Symptoms:Greater than six months  Hallucinations:Hallucinations: None   Ideas of Reference:None  Suicidal Thoughts:Suicidal Thoughts: No   Homicidal Thoughts:Homicidal Thoughts: No    Sensorium  Memory: Immediate Good; Recent Good; Remote Good  Judgment: Fair  Insight: Poor; Shallow   Executive Functions  Concentration: Fair  Attention Span: Fair  Recall: [employer] of Knowledge: Fair  Language: Fair   Psychomotor Activity  Psychomotor Activity: Psychomotor Activity: Normal    Assets  Assets: Communication Skills; Social Support; Housing   Sleep  Sleep: Sleep: Fair     Physical Exam: Physical Exam Vitals reviewed.  Constitutional:      General: He is not in acute distress.    Appearance: He is normal weight. He is not toxic-appearing.  Neurological:     Mental Status: He is alert.    Review of Systems  Constitutional:  Negative for chills and fever.  Cardiovascular:  Negative for chest pain and palpitations.  Neurological:  Negative for dizziness, tingling, tremors and headaches.  Psychiatric/Behavioral:  Positive for substance abuse. Negative for depression, hallucinations and suicidal ideas. The patient is nervous/anxious. The patient does not have insomnia.    Blood pressure 112/72, pulse 78, temperature (!) 97.5 F (36.4 C), temperature source Oral, resp. rate 16, height 5\' 5"  (1.651 m), weight 63 kg, SpO2 100 %. Body mass index is 23.13 kg/m.   Treatment Plan Summary: Daily contact with patient to assess and evaluate symptoms and progress in treatment Treatment Plan Summary: Daily contact with patient to assess and evaluate symptoms and progress in treatment     ASSESSMENT:   Diagnoses / Active Problems: -Schizophrenia    PLAN: Safety and Monitoring:             -- Involuntary admission to inpatient psychiatric unit for safety, stabilization and treatment             -- Daily  contact with patient to assess and evaluate symptoms and progress in treatment             -- Patient's case to be discussed in multi-disciplinary team meeting             -- Observation Level : q15 minute checks             -- Vital signs:  q12 hours             -- Precautions: suicide, elopement, and assault   2. Psychiatric Diagnoses and Treatment:               -Prolixin 25 mg IM LAI given 05-03-22 - need to confirm last adminsitration, the give 25 mg q2weeks from last dose.  -Continue depakote 750 mg bid for mood stabilization - previously effective with prolixin during last admission. Non-adherent at home   -Repeat VPA level 1/7 AM -Continue cogentin 1 mg bid for eps ppx  -Previously discontinued ambien -Continue remeron  -Continue zyprexa oral and IM PRN for agitation    --  The risks/benefits/side-effects/alternatives to this medication were discussed in detail with the patient and time was given for questions. The patient consents to medication trial.                -- Metabolic profile and EKG monitoring obtained while on an atypical antipsychotic (BMI: Lipid Panel: HbgA1c: QTc:)              -- Encouraged patient to participate in unit milieu and in scheduled group therapies                          3. Medical Issues Being Addressed:              Tobacco Use Disorder             -- Nicotine gum PRN              -- Smoking cessation encouraged   4. Discharge Planning:              -- Social work and case management to assist with discharge planning and identification of hospital follow-up needs prior to discharge             -- Estimated LOS: 5-7 days             -- Discharge Concerns: Need to establish a safety plan; Medication compliance and effectiveness             -- Discharge Goals: Return home with outpatient referrals for mental health follow-up including medication management/psychotherapy   Rosezetta Schlatter, MD 05/04/2022, 3:30 PM

## 2022-05-04 NOTE — Progress Notes (Signed)
Father came to visit patient. Father spoke with sfaff and requested patient NOT be prescribed Ambien. 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

## 2022-05-04 NOTE — Progress Notes (Signed)
   05/04/22 2000  Psych Admission Type (Psych Patients Only)  Admission Status Involuntary  Psychosocial Assessment  Patient Complaints Suspiciousness  Eye Contact Fair  Facial Expression Anxious  Affect Anxious;Preoccupied  Speech Logical/coherent  Interaction Cautious;Isolative  Motor Activity Slow  Appearance/Hygiene Unremarkable  Behavior Characteristics Cooperative  Mood Suspicious  Aggressive Behavior  Effect No apparent injury  Thought Process  Coherency Circumstantial  Content Preoccupation  Delusions WDL  Perception WDL  Hallucination None reported or observed  Judgment Poor  Confusion WDL  Danger to Self  Current suicidal ideation? Denies  Danger to Others  Danger to Others None reported or observed

## 2022-05-05 DIAGNOSIS — F201 Disorganized schizophrenia: Secondary | ICD-10-CM | POA: Diagnosis not present

## 2022-05-05 LAB — VALPROIC ACID LEVEL: Valproic Acid Lvl: 81 ug/mL (ref 50.0–100.0)

## 2022-05-05 NOTE — Progress Notes (Signed)
[facility] MD Progress Note  05/05/2022 12:31 PM Brendan Hogan  MRN:  [id number]  Subjective:    Brendan Hogan is a 27 y.o., male with a past psychiatric history significant for schizophrenia who was admitted to the psychiatric hospital under IVC for bizarre behaviors, agitation, appearing to respond to internal stimuli.    On admission, the following recommendations were made: -Continue Prolixin LAI - need to confirm last adminsitration, the give 25 mg q2weeks from last dose.  -Restart depakote 750 mg bid for mood stabilization - previously effective fro stabilization plus prolix during last admission. Non adherent at home  -Restart cogentin 1 mg bid for eps ppx  -Stop ambien -Restart remeron  -start zyprexa oral and IM PRN for agitation   The patient was seen today and the chart was reviewed and the case was discussed with nursing staff and the treatment team.  According to staff the patient slept fairly well and has not had any major behavioral problems although he remains somewhat labile.  On evaluation, the patient appears alert oriented and cooperative but somewhat sedated.  His mood is stable and he denies any active SI/HI/AVH.  He denies any acute paranoia but continues to perseverate on discharge and continues to ask for Ambien.  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  Today he denies any acute side effects on medications  According to records the patient received Prolixin Decanoate.  His Depakote was restarted and will obtain a Depakote level.  The patient was tested for flu today and was noted to be negative.   Principal Problem: Schizophrenia (HCC) Diagnosis: Principal Problem:   Schizophrenia (HCC)  Total Time spent with patient: 15 minutes  Past Psychiatric History:  Prior Psychiatric diagnoses: Schizophrenia Past Psychiatric Hospitalizations: Multiple,  last hospitalization September 2023 at [facility]    History of self mutilation: None noted Past suicide attempts: Patient reports 1 suicide attempt but "long time ago" but unable to elaborate Past history of HI, violent or aggressive behavior: Per chart review history of violence and agitation towards family members   Past Psychiatric medications trials: Haldol caused EPS and neck stiffness best response was obtained from Abilify and Abilify Maintena but patient is noncompliant, risperidone and Risperdal Consta were prescribed in the past but unable to confirm if was effective. Patient was most recently discharged on Prolixin oral, Prolixin LAI, Depakote, Ativan, and Ambien   History of ECT/TMS: Patient denies   Outpatient psychiatric Follow up: [facility]  Prior Outpatient Therapy: Unknown  Past Medical History:  Past Medical History:  Diagnosis Date   Psychosis (HCC) 05/2019   [facility]    Seasonal allergies     Past Surgical History:  Procedure Laterality Date   TONSILLECTOMY     Family History:  Family History  Problem Relation Age of Onset   Hypertension Mother    Family Psychiatric  History:  Psychiatric illness: Patient denies Suicide: Patient denies Substance Abuse: Patient denies    Social History:  Social History   Substance and Sexual Activity  Alcohol Use Not Currently   Comment: rare     Social History   Substance and Sexual Activity  Drug Use No    Social History   Socioeconomic History   Marital status: Single    Spouse name: Not on file   Number of children: Not on file   Years of  education: Not on file   Highest education level: Not on file  Occupational History   Not on file  Tobacco Use   Smoking status: Every Day    Packs/day: 1.00    Types: Cigarettes   Smokeless tobacco: Former  Scientific laboratory technician Use: Never used  Substance and Sexual Activity   Alcohol use: Not Currently    Comment: rare   Drug use: No   Sexual activity:  Not Currently  Other Topics Concern   Not on file  Social History Narrative   Not on file   Social Determinants of Health   Financial Resource Strain: Not on file  Food Insecurity: Unknown (05/01/2022)   Hunger Vital Sign    Worried About Running Out of Food in the Last Year: Patient refused    [place] in the Last Year: Patient refused  Transportation Needs: Unknown (05/01/2022)   [place] - Hydrologist (Medical): Patient refused    Lack of Transportation (Non-Medical): Patient refused  Physical Activity: Not on file  Stress: Not on file  Social Connections: Not on file   Additional Social History:                         Sleep: Fair  Appetite:  Fair  Current Medications: Current Facility-Administered Medications  Medication Dose Route Frequency Provider Last Rate Last Admin   acetaminophen (TYLENOL) tablet 650 mg  650 mg Oral Q6H PRN Motley-Mangrum, Jadeka A, PMHNP       benztropine (COGENTIN) tablet 1 mg  1 mg Oral BID Motley-Mangrum, Jadeka A, PMHNP   1 mg at 05/05/22 0753   divalproex (DEPAKOTE) DR tablet 750 mg  750 mg Oral Q12H Motley-Mangrum, Jadeka A, PMHNP   750 mg at 05/05/22 0753   fluPHENAZine decanoate (PROLIXIN) injection 25 mg  25 mg Intramuscular Q14 Days Massengill, Ovid Curd, MD   25 mg at 05/03/22 1207   hydrOXYzine (ATARAX) tablet 25 mg  25 mg Oral TID PRN Motley-Mangrum, Jadeka A, PMHNP   25 mg at 05/03/22 2035   LORazepam (ATIVAN) tablet 1 mg  1 mg Oral Once PRN Motley-Mangrum, Jadeka A, PMHNP       magnesium hydroxide (MILK OF MAGNESIA) suspension 30 mL  30 mL Oral Daily PRN Motley-Mangrum, Jadeka A, PMHNP       mirtazapine (REMERON SOL-TAB) disintegrating tablet 15 mg  15 mg Oral QHS Massengill, Ovid Curd, MD   15 mg at 05/04/22 2022   nicotine polacrilex (NICORETTE) gum 2 mg  2 mg Oral PRN Motley-Mangrum, Jadeka A, PMHNP   2 mg at 05/05/22 0755   OLANZapine (ZYPREXA) injection 10 mg  10 mg Intramuscular BID PRN  Massengill, Ovid Curd, MD       OLANZapine zydis (ZYPREXA) disintegrating tablet 10 mg  10 mg Oral Once PRN Motley-Mangrum, Jadeka A, PMHNP       traZODone (DESYREL) tablet 50 mg  50 mg Oral QHS PRN Motley-Mangrum, Jadeka A, PMHNP   50 mg at 05/03/22 2035    Lab Results:  Results for orders placed or performed during the hospital encounter of 05/01/22 (from the past 48 hour(s))  Valproic acid level     Status: None   Collection Time: 05/05/22  6:29 AM  Result Value Ref Range   Valproic Acid Lvl 81 50.0 - 100.0 ug/mL    Comment: Performed at [facility], [place] [address] [place], [place] [address]  Blood Alcohol level:  Lab Results  Component Value Date   ETH <10 05/01/2022   ETH <10 03/11/2022    Metabolic Disorder Labs: Lab Results  Component Value Date   HGBA1C 5.4 05/02/2022   MPG 108 05/02/2022   MPG 91.06 01/23/2022   Lab Results  Component Value Date   PROLACTIN 26.5 (H) 09/08/2019   Lab Results  Component Value Date   CHOL 162 05/02/2022   TRIG 71 05/02/2022   HDL 52 05/02/2022   CHOLHDL 3.1 05/02/2022   VLDL 14 05/02/2022   LDLCALC 96 05/02/2022   LDLCALC 91 01/23/2022    Physical Findings: AIMS: Facial and Oral Movements Muscles of Facial Expression: None, normal Lips and Perioral Area: None, normal Jaw: None, normal Tongue: None, normal,Extremity Movements Upper (arms, wrists, hands, fingers): None, normal Lower (legs, knees, ankles, toes): None, normal, Trunk Movements Neck, shoulders, hips: None, normal, Overall Severity Severity of abnormal movements (highest score from questions above): None, normal Incapacitation due to abnormal movements: None, normal Patient's awareness of abnormal movements (rate only patient's report): No Awareness, Dental Status Current problems with teeth and/or dentures?: No Does patient usually wear dentures?: No  CIWA:    COWS:     Musculoskeletal: Strength & Muscle Tone: Laying in bed  Gait &  Station: Laying in bed  Patient leans: Laying in bed   Psychiatric Specialty Exam:  Presentation  General Appearance:  Laying on ground, blanket over head  Eye Contact: Fair  Speech: Slow  Speech Volume: Decreased  Handedness: Right   Mood and Affect  Mood: Anxious  Affect: Blunt; Constricted   Thought Process  Thought Processes: Disorganized  Descriptions of Associations:Circumstantial  Orientation:Full (Time, Place and Person)  Thought Content:Perseveration; Rumination  History of Schizophrenia/Schizoaffective disorder:Yes  Duration of Psychotic Symptoms:Greater than six months  Hallucinations:Hallucinations: None   Ideas of Reference:None  Suicidal Thoughts:Suicidal Thoughts: No   Homicidal Thoughts:Homicidal Thoughts: No    Sensorium  Memory: Immediate Fair; Recent Fair; Remote Fair  Judgment: Fair  Insight: Fair   Art therapist  Concentration: Fair  Attention Span: Fair  Recall: [employer] of Knowledge: Fair  Language: Fair   Psychomotor Activity  Psychomotor Activity: Psychomotor Activity: Normal    Assets  Assets: Communication Skills; Financial Resources/Insurance; Research scientist (medical); Housing; Transportation   Sleep  Sleep: Sleep: Good Number of Hours of Sleep: 8.25     Physical Exam: Physical Exam Vitals reviewed.  Constitutional:      General: He is not in acute distress.    Appearance: He is normal weight. He is not toxic-appearing.  Neurological:     Mental Status: He is alert.    Review of Systems  Constitutional:  Negative for chills and fever.  Cardiovascular:  Negative for chest pain and palpitations.  Neurological:  Negative for dizziness, tingling, tremors and headaches.  Psychiatric/Behavioral:  Positive for substance abuse. Negative for depression, hallucinations and suicidal ideas. The patient is nervous/anxious. The patient does not have insomnia.    Blood pressure 106/69,  pulse 100, temperature 98.1 F (36.7 C), temperature source Oral, resp. rate 16, height 5\' 5"  (1.651 m), weight 63 kg, SpO2 100 %. Body mass index is 23.13 kg/m.   Treatment Plan Summary: Daily contact with patient to assess and evaluate symptoms and progress in treatment Treatment Plan Summary: Daily contact with patient to assess and evaluate symptoms and progress in treatment     ASSESSMENT:   Diagnoses / Active Problems: -Schizophrenia    PLAN: Safety and Monitoring:             --  Involuntary admission to inpatient psychiatric unit for safety, stabilization and treatment             -- Daily contact with patient to assess and evaluate symptoms and progress in treatment             -- Patient's case to be discussed in multi-disciplinary team meeting             -- Observation Level : q15 minute checks             -- Vital signs:  q12 hours             -- Precautions: suicide, elopement, and assault   2. Psychiatric Diagnoses and Treatment:               -Prolixin 25 mg IM LAI given 05-03-22 - need to confirm last adminsitration, the give 25 mg q2weeks from last dose.  -Continue depakote 750 mg bid for mood stabilization - previously effective with prolixin during last admission. Non-adherent at home   -Repeat VPA level on 1/7 is pending. -Continue cogentin 1 mg bid for eps ppx  -Previously discontinued ambien -Continue remeron  -Continue zyprexa oral and IM PRN for agitation    --  The risks/benefits/side-effects/alternatives to this medication were discussed in detail with the patient and time was given for questions. The patient consents to medication trial.                -- Metabolic profile and EKG monitoring obtained while on an atypical antipsychotic (BMI: Lipid Panel: HbgA1c: QTc:)              -- Encouraged patient to participate in unit milieu and in scheduled group therapies                          3. Medical Issues Being Addressed:              Tobacco Use  Disorder             -- Nicotine gum PRN              -- Smoking cessation encouraged   4. Discharge Planning:              -- Social work and case management to assist with discharge planning and identification of hospital follow-up needs prior to discharge             -- Estimated LOS: 5-7 days             -- Discharge Concerns: Need to establish a safety plan; Medication compliance and effectiveness             -- Discharge Goals: Return home with outpatient referrals for mental health follow-up including medication management/psychotherapy  I certify that inpatient services furnished can reasonably be expected to improve the patient's condition.    Rex Kras, MD 05/05/2022, 12:31 PM  Patient ID: [name], male   DOB: 1995-06-15, 27 y.o.   MRN: [id number]

## 2022-05-05 NOTE — Progress Notes (Signed)
   05/05/22 2100  Psych Admission Type (Psych Patients Only)  Admission Status Involuntary  Psychosocial Assessment  Patient Complaints None  Eye Contact Fair  Facial Expression Animated  Affect Appropriate to circumstance  Speech Logical/coherent  Interaction Assertive  Motor Activity Other (Comment) (WNL)  Appearance/Hygiene Unremarkable  Behavior Characteristics Cooperative  Mood Pleasant  Aggressive Behavior  Effect No apparent injury  Thought Process  Coherency Circumstantial  Content WDL  Delusions None reported or observed  Perception WDL  Hallucination None reported or observed  Judgment Poor  Confusion WDL  Danger to Self  Current suicidal ideation? Denies  Danger to Others  Danger to Others None reported or observed

## 2022-05-05 NOTE — Progress Notes (Signed)
   05/05/22 0559  15 Minute Checks  Location Bedroom  Visual Appearance Calm  Behavior Sleeping  Sleep (Behavioral Health Patients Only)  Calculate sleep? (Click Yes once per 24 hr at 0600 safety check) Yes  Documented sleep last 24 hours 8.25

## 2022-05-05 NOTE — Progress Notes (Signed)
Patient denies SI, HI, and AVH this shift. Patient has been isolative to room this shift. Patient has had no behavioral dyscontrol this shift.   Assess patient for safety, offer medications as prescribed, engage patient in 1:1 staff talks.   Continue to monitor for safety. Patient able to contract for safety.

## 2022-05-06 DIAGNOSIS — F201 Disorganized schizophrenia: Secondary | ICD-10-CM | POA: Diagnosis not present

## 2022-05-06 NOTE — BHH Counselor (Signed)
[facility]/BM[facility] LCSW Progress Note   05/06/2022    11:07 AM  Brendan Hogan   [id number]   Type of Contact and Topic:  Collateral Attempt  CSW attempted to contact guardian, Doak Mah, regarding patient treatment and safety planning.  Guardian phone went straight to voicemail.  CSW left a HIPAA compliant voicemail for a call back.     Signed:  Riki Altes MSW, LCSW, LCAS 05/06/2022 11:07 AM

## 2022-05-06 NOTE — [facility] Suicide Risk Assessment (Signed)
[facility] INPATIENT:  Family/Significant Other Suicide Prevention Education  Suicide Prevention Education:  Education Completed; Trenten Watchman,  [phone]  (name of family member/significant other) has been identified by the patient as the family member/significant other with whom the patient will be residing, and identified as the person(s) who will aid the patient in the event of a mental health crisis (suicidal ideations/suicide attempt).  With written consent from the patient, the family member/significant other has been provided the following suicide prevention education, prior to the and/or following the discharge of the patient.  CSW spoke with father who states that patient needs to go to [facility].  CSW discussed barriers to get into [facility] and that patient has been declined to [facility].  Father discussed that he would be okay with patient getting connected with an ACTT. Father states that he has been going to [facility] and getting injection but they also prescribe him Lorrin Mais which has not been good for him. Father states that they don't listen to him, guardian and keep prescribing.  He would like patient to see Dr. Roma Schanz rather than [facility] but states patient won't go to any other doctor because he wants ambien from Dr. Altamese [place]. Father would like doctor to speak to Dr. Altamese [place] about not prescribing Ambien.  Father also states that he thinks that the injection patient is on has not been making patient himself.   The suicide prevention education provided includes the following: Suicide risk factors Suicide prevention and interventions National Suicide Hotline telephone number [facility] assessment telephone number [facility] Emergency Assistance [place] and/or Residential Mobile Crisis Unit telephone number  Request made of family/significant other to: Remove weapons (e.g., guns, rifles, knives), all items previously/currently identified as safety concern.   Remove drugs/medications  (over-the-counter, prescriptions, illicit drugs), all items previously/currently identified as a safety concern.  The family member/significant other verbalizes understanding of the suicide prevention education information provided.  The family member/significant other agrees to remove the items of safety concern listed above.  Anabelle Bungert E Saramarie Stinger 05/06/2022, 2:36 PM

## 2022-05-06 NOTE — Progress Notes (Signed)
Adult Psychoeducational Group Note  Date:  05/06/2022 Time:  8:19 PM  Group Topic/Focus:  Wrap-Up Group:   The focus of this group is to help patients review their daily goal of treatment and discuss progress on daily workbooks.  Participation Level:  Active  Participation Quality:  Appropriate  Affect:  Appropriate  Cognitive:  Appropriate  Insight: Appropriate  Engagement in Group:  Engaged  Modes of Intervention:  Discussion  Additional Comments:   Pt states that he had an "okay" day during group discussion. Pt states that he got good sleep and had a visit from his mother today. Pt states that he has not been going to group during the day but was encouraged to attend groups throughout the day by Probation officer. Pt denies everything.  Brendan Hogan 05/06/2022, 8:19 PM

## 2022-05-06 NOTE — B[facility] Group Notes (Signed)
Child/Adolescent Psychoeducational Group Note  Date:  05/06/2022 Time:  9:37 AM  Group Topic/Focus:  Goals Group:   The focus of this group is to help patients establish daily goals to achieve during treatment and discuss how the patient can incorporate goal setting into their daily lives to aide in recovery.  Participation Level:  Did Not Attend  Additional Comments:  Patient was told multiple times that group was starting but still did not attend.  Dejay Kronk T [name] 05/06/2022, 9:37 AM

## 2022-05-06 NOTE — Progress Notes (Signed)
[facility]  05/06/2022 11:39 AM Brendan CossMustafa O Montee  MRN:  [id number]  Subjective:    Brendan Hogan is a 27 y.o., male with a past psychiatric history significant for schizophrenia who was admitted to the psychiatric hospital under IVC for bizarre behaviors, agitation, appearing to respond to internal stimuli.    On admission, the following recommendations were made: -Continue Prolixin LAI - need to confirm last adminsitration, the give 25 mg q2weeks from last dose.  -Restart depakote 750 mg bid for mood stabilization - previously effective fro stabilization plus prolix during last admission. Non adherent at home  -Restart cogentin 1 mg bid for eps ppx  -Stop ambien -Restart remeron  -start zyprexa oral and IM PRN for agitation   The patient was seen and evaluated and the chart was reviewed and the case was discussed with the treatment team.  According to the records patient has not had any behavioral issues overnight and has been compliant with treatment.  Although he remains somewhat withdrawn and isolative. He has slept 11.25 hours last night.  And still denies any side effects from medications.  He is currently on Depakote 750 mg twice a day and Prolixin decanoate 25 mg IM which chest pain administered on 05/03/2022.  He is also on Remeron 15 mg at night.  No adverse side effects are noted.  Patient denies any active SI/HI/AVH although he is occasionally responding to stimuli.  He does not have acute paranoia but continues to perseverate further about discharge.  Father apparently is ambivalent about him taking anything that is addictive because of his tendency to abuse them.  The plan was for him to go to program however he does not have any insurance support going to a long-term rehab facility but this is being explored.  Potential discharge in the next few days.  Principal Problem: Schizophrenia (HCC) Diagnosis: Principal Problem:   Schizophrenia (HCC)  Total Time spent with  patient: 15 minutes  Past Psychiatric History:  Prior Psychiatric diagnoses: Schizophrenia Past Psychiatric Hospitalizations: Multiple, [facility]    History of self mutilation: None noted Past suicide attempts: Patient reports 1 suicide attempt but "long time ago" but unable to elaborate Past history of HI, violent or aggressive behavior: Per chart review history of violence and agitation towards family members   Past Psychiatric medications trials: Haldol caused EPS and neck stiffness best response was obtained from Abilify and Abilify Maintena but patient is noncompliant, risperidone and Risperdal Consta were prescribed in the past but unable to confirm if was effective. Patient was most recently discharged on Prolixin oral, Prolixin LAI, Depakote, Ativan, and Ambien   History of ECT/TMS: Patient denies   Outpatient psychiatric Follow up: [facility]  Prior Outpatient Therapy: Unknown  Past Medical History:  Past Medical History:  Diagnosis Date   Psychosis (HCC) [facility]    Seasonal allergies     Past Surgical History:  Procedure Laterality Date   TONSILLECTOMY     Family History:  Family History  Problem Relation Age of Onset   Hypertension Mother    Family Psychiatric  History:  Psychiatric illness: Patient denies Suicide: Patient denies Substance Abuse: Patient denies    Social History:  Social History   Substance and Sexual Activity  Alcohol Use Not Currently   Comment: rare     Social History   Substance and Sexual Activity  Drug Use No    Social History  Socioeconomic History   Marital status: Single    Spouse name: Not on file   Number of children: Not on file   Years of education: Not on file   Highest education level: Not on file  Occupational History   Not on file  Tobacco Use   Smoking status: Every Day    Packs/day: 1.00    Types: Cigarettes   Smokeless tobacco: Former  Haematologist Use: Never used  Substance and Sexual Activity   Alcohol use: Not Currently    Comment: rare   Drug use: No   Sexual activity: Not Currently  Other Topics Concern   Not on file  Social History Narrative   Not on file   Social Determinants of Health   Financial Resource Strain: Not on file  Food Insecurity: Unknown (05/01/2022)   Hunger Vital Sign    Worried About Running Out of Food in the Last Year: Patient refused    Ran Out of Food in the Last Year: Patient refused  Transportation Needs: Unknown (05/01/2022)   PRAPARE - Administrator, Civil Service (Medical): Patient refused    Lack of Transportation (Non-Medical): Patient refused  Physical Activity: Not on file  Stress: Not on file  Social Connections: Not on file   Additional Social History:                         Sleep: Fair  Appetite:  Fair  Current Medications: Current Facility-Administered Medications  Medication Dose Route Frequency Provider Last Rate Last Admin   acetaminophen (TYLENOL) tablet 650 mg  650 mg Oral Q6H PRN Motley-Mangrum, Jadeka A, PMHNP       benztropine (COGENTIN) tablet 1 mg  1 mg Oral BID Motley-Mangrum, Jadeka A, PMHNP   1 mg at 05/06/22 0755   divalproex (DEPAKOTE) DR tablet 750 mg  750 mg Oral Q12H Motley-Mangrum, Jadeka A, PMHNP   750 mg at 05/06/22 0754   fluPHENAZine decanoate (PROLIXIN) injection 25 mg  25 mg Intramuscular Q14 Days Massengill, Harrold Donath, MD   25 mg at 05/03/22 1207   hydrOXYzine (ATARAX) tablet 25 mg  25 mg Oral TID PRN Motley-Mangrum, Jadeka A, PMHNP   25 mg at 05/05/22 2351   LORazepam (ATIVAN) tablet 1 mg  1 mg Oral Once PRN Motley-Mangrum, Jadeka A, PMHNP       magnesium hydroxide (MILK OF MAGNESIA) suspension 30 mL  30 mL Oral Daily PRN Motley-Mangrum, Jadeka A, PMHNP       mirtazapine (REMERON SOL-TAB) disintegrating tablet 15 mg  15 mg Oral QHS Massengill, Harrold Donath, MD   15 mg at 05/05/22 2024   nicotine polacrilex (NICORETTE) gum 2 mg  2  mg Oral PRN Motley-Mangrum, Jadeka A, PMHNP   2 mg at 05/05/22 1756   OLANZapine (ZYPREXA) injection 10 mg  10 mg Intramuscular BID PRN Massengill, Harrold Donath, MD       OLANZapine zydis (ZYPREXA) disintegrating tablet 10 mg  10 mg Oral Once PRN Motley-Mangrum, Jadeka A, PMHNP       traZODone (DESYREL) tablet 50 mg  50 mg Oral QHS PRN Motley-Mangrum, Jadeka A, PMHNP   50 mg at 05/05/22 2352    Lab Results:  Results for orders placed or performed during the hospital encounter of 05/01/22 (from the past 48 hour(s))  Valproic acid level     Status: None   Collection Time: 05/05/22  6:29 AM  Result Value Ref Range   Valproic  Acid Lvl 81 50.0 - 100.0 ug/mL    Comment: Performed at [facility], [place] [address] [place], [place] [address]     Blood Alcohol level:  Lab Results  Component Value Date   [facility] <10 05/01/2022   ETH <10 78/93/8101    Metabolic Disorder Labs: Lab Results  Component Value Date   HGBA1C 5.4 05/02/2022   MPG 108 05/02/2022   MPG 91.06 01/23/2022   Lab Results  Component Value Date   PROLACTIN 26.5 (H) 09/08/2019   Lab Results  Component Value Date   CHOL 162 05/02/2022   TRIG 71 05/02/2022   HDL 52 05/02/2022   CHOLHDL 3.1 05/02/2022   VLDL 14 05/02/2022   LDLCALC 96 05/02/2022   LDLCALC 91 01/23/2022    Physical Findings: AIMS: Facial and Oral Movements Muscles of Facial Expression: None, normal Lips and Perioral Area: None, normal Jaw: None, normal Tongue: None, normal,Extremity Movements Upper (arms, wrists, hands, fingers): None, normal Lower (legs, knees, ankles, toes): None, normal, Trunk Movements Neck, shoulders, hips: None, normal, Overall Severity Severity of abnormal movements (highest score from questions above): None, normal Incapacitation due to abnormal movements: None, normal Patient's awareness of abnormal movements (rate only patient's report): No Awareness, Dental Status Current problems with teeth and/or  dentures?: No Does patient usually wear dentures?: No  CIWA:    COWS:     Musculoskeletal: Strength & Muscle Tone: Laying in bed  Gait & Station: Laying in bed  Patient leans: Laying in bed   Psychiatric Specialty Exam:  Presentation  General Appearance:  Laying on ground, blanket over head  Eye Contact: Fair  Speech: Slow  Speech Volume: Decreased  Handedness: Right   Mood and Affect  Mood: Anxious  Affect: Constricted   Thought Process  Thought Processes: Disorganized  Descriptions of Associations:Tangential  Orientation:Full (Time, Place and Person)  Thought Content:Rumination; Scattered; Perseveration  History of Schizophrenia/Schizoaffective disorder:Yes  Duration of Psychotic Symptoms:Greater than six months  Hallucinations:Hallucinations: Auditory Description of Auditory Hallucinations: Improving hallucinations by self report but still appears to be occationally responding.   Ideas of Reference:Delusions  Suicidal Thoughts:Suicidal Thoughts: No   Homicidal Thoughts:Homicidal Thoughts: No    Sensorium  Memory: Immediate Fair; Recent Fair; Remote Fair  Judgment: Fair  Insight: Fair   Materials engineer: Fair  Attention Span: Fair  Recall: [employer] of Knowledge: Fair  Language: Fair   Psychomotor Activity  Psychomotor Activity: Psychomotor Activity: Normal    Assets  Assets: Desire for Improvement; Financial Resources/Insurance; Housing; Social Support; Transportation   Sleep  Sleep: Sleep: Good Number of Hours of Sleep: 11     Physical Exam: Physical Exam Vitals reviewed.  Constitutional:      General: He is not in acute distress.    Appearance: He is normal weight. He is not toxic-appearing.  Neurological:     Mental Status: He is alert.    Review of Systems  Constitutional:  Negative for chills and fever.  Cardiovascular:  Negative for chest pain and palpitations.   Neurological:  Negative for dizziness, tingling, tremors and headaches.  Psychiatric/Behavioral:  Positive for substance abuse. Negative for depression, hallucinations and suicidal ideas. The patient is nervous/anxious. The patient does not have insomnia.    Blood pressure 103/64, pulse 96, temperature 97.8 F (36.6 C), temperature source Oral, resp. rate 16, height 5\' 5"  (1.651 m), weight 63 kg, SpO2 99 %. Body mass index is 23.13 kg/m.   Treatment Plan Summary: Daily contact with  patient to assess and evaluate symptoms and progress in treatment Treatment Plan Summary: Daily contact with patient to assess and evaluate symptoms and progress in treatment     ASSESSMENT:   Diagnoses / Active Problems: -Schizophrenia    PLAN: Safety and Monitoring:             -- Involuntary admission to inpatient psychiatric unit for safety, stabilization and treatment             -- Daily contact with patient to assess and evaluate symptoms and progress in treatment             -- Patient's case to be discussed in multi-disciplinary team meeting             -- Observation Level : q15 minute checks             -- Vital signs:  q12 hours             -- Precautions: suicide, elopement, and assault   2. Psychiatric Diagnoses and Treatment:               -Prolixin 25 mg IM LAI given 05-03-22 - need to confirm last adminsitration, the give 25 mg q2weeks from last dose.  -Continue depakote 750 mg bid for mood stabilization - previously effective with prolixin during last admission. Non-adherent at home will obtain valproic acid level on 05/07/2022 -Continue cogentin 1 mg bid for eps ppx  -Previously discontinued ambien -Continue remeron  -Continue zyprexa oral and IM PRN for agitation    --  The risks/benefits/side-effects/alternatives to this medication were discussed in detail with the patient and time was given for questions. The patient consents to medication trial.                -- Metabolic  profile and EKG monitoring obtained while on an atypical antipsychotic (BMI: Lipid Panel: HbgA1c: QTc:)              -- Encouraged patient to participate in unit milieu and in scheduled group therapies                          3. Medical Issues Being Addressed:              Tobacco Use Disorder             -- Nicotine gum PRN              -- Smoking cessation encouraged   4. Discharge Planning:              -- Social work and case management to assist with discharge planning and identification of hospital follow-up needs prior to discharge             -- Estimated LOS: 5-7 days             -- Discharge Concerns: Need to establish a safety plan; Medication compliance and effectiveness             -- Discharge Goals: Return home with outpatient referrals for mental health follow-up including medication management/psychotherapy  I certify that inpatient services furnished can reasonably be expected to improve the patient's condition.    Rex Kras, MD 05/06/2022, 11:39 AM  Patient ID: Brendan Hogan, male   DOB: April 05, 1996, 26 y.o.   MRN: [id number] Patient ID: Brendan Hogan, male   DOB: February 23, 1996, 27 y.o.   MRN: [id number]

## 2022-05-06 NOTE — Group Note (Signed)
Recreation Therapy Group Note   Group Topic:Coping Skills  Group Date: 05/06/2022 Start Time: 1006 End Time: 1941 Facilitators: Aishwarya Shiplett-McCall, LRT,CTRS Location: [address] Dayroom   Goal Area(s) Addresses: Patient will define what a coping skill is. Patient will work to create a list of healthy coping skills beginning with each letter of the alphabet. Patient will successfully identify positive coping skills they can use post d/c.  Patient will acknowledge benefit(s) of using learned coping skills post d/c.  Group Description: Coping A to Z. Patient asked to identify what a coping skill is and when they use them. Patients with Probation officer discussed healthy versus unhealthy coping skills. Next patients were given a blank worksheet titled "Coping Skills A-Z". Patients were instructed to come up with at least one positive coping skill per letter of the alphabet. Patients were given 15 minutes to brainstorm, before ideas were presented to the large group. Patients and LRT debriefed on the importance of coping skill selection based on situation and back-up plans when a skill tried is not effective. At the end of group, patients were given an handout of alphabetized strategies to keep for future reference.   Affect/Mood: N/A   Participation Level: Did not attend    Clinical Observations/Individualized Feedback:    Plan: Continue to engage patient in RT group sessions 2-3x/week.   Trenton Passow-McCall, LRT,CTRS 05/06/2022 1:50 PM

## 2022-05-06 NOTE — Progress Notes (Signed)
   05/06/22 2409  15 Minute Checks  Location Dayroom  Visual Appearance Calm  Behavior Composed  Sleep (Behavioral Health Patients Only)  Calculate sleep? (Click Yes once per 24 hr at 0600 safety check) Yes  Documented sleep last 24 hours 11.25

## 2022-05-06 NOTE — Progress Notes (Signed)
   05/06/22 2100  Psych Admission Type (Psych Patients Only)  Admission Status Involuntary  Psychosocial Assessment  Patient Complaints None  Eye Contact Fair  Facial Expression Animated;Anxious  Affect Appropriate to circumstance  Speech Logical/coherent  Interaction Assertive  Motor Activity Other (Comment) (WNL)  Appearance/Hygiene Unremarkable  Behavior Characteristics Cooperative;Appropriate to situation  Mood Pleasant;Anxious  Aggressive Behavior  Effect No apparent injury  Thought Process  Coherency Circumstantial  Content WDL  Delusions None reported or observed  Perception WDL  Hallucination None reported or observed  Judgment Poor  Confusion None  Danger to Self  Current suicidal ideation? Denies  Danger to Others  Danger to Others None reported or observed

## 2022-05-06 NOTE — B[facility] Counselor (Addendum)
[facility]/BM[facility] LCSW Progress Note   05/06/2022    10:28 AM  Brendan Hogan   [id number]   Type of Contact and Topic:  Rehab follow up  CSW called [facility] and they report that the nurse has his application under review.  They confirmed that they will call this social worker when they have reviewed it to set up an interview.    CSW called [facility] regarding admission to their program.  Admission coordinator, Phineas Real, states that they are reviewing his referral and after they review it she will give me a call. CSW confirmed with Phineas Real best contact number to get back in touch.  Addendum: [facility] call back and he has been denied admission to the program due to psychiatric acuity.    Signed:  Riki Altes MSW, LCSW, LCAS 05/06/2022 10:28 AM

## 2022-05-06 NOTE — Group Note (Signed)
   Date/Time: 05/06/2022 @ 1pm  Type of Therapy and Topic:  Group Therapy:  triggers  Participation Level:  Did not attend  Description of Group:   Recognizing Triggers: Patients defined triggers and discussed the importance of recognizing their personal warning signs. Patients identified their own triggers and how they tend to cope with stressful situations. Patients discussed areas such as people, places, things, and thoughts that rigger certain emotions for them. CSW provided support to patients and discussed safety planning for when these triggers occur. Group participants had opportunities to share openly with the group and participate in a group discussion while providing support and feedback to their peers.  Therapeutic Goals: Patient will identify triggers that are contributing to a problem in their life Patient will identify unwanted behaviors and feelings associated with a trigger.  Patient will share with other group members strategies to confront and avoid triggers so that they may be able to react appropriately to triggers in daily life.    Summary of Patient Progress: x     Therapeutic Modalities:   Cognitive Behavioral Therapy Solution Focused Therapy Motivational Interviewing Family Systems Approach   Ziza Hastings, LCSW, LCAS Clincal Social Worker  [facility]  

## 2022-05-06 NOTE — Progress Notes (Signed)
   05/05/22 2100  Psych Admission Type (Psych Patients Only)  Admission Status Involuntary  Psychosocial Assessment  Patient Complaints None  Eye Contact Fair  Facial Expression Animated  Affect Appropriate to circumstance  Speech Logical/coherent  Interaction Assertive  Motor Activity Other (Comment) (WNL)  Appearance/Hygiene Unremarkable  Behavior Characteristics Cooperative  Mood Pleasant  Aggressive Behavior  Effect No apparent injury  Thought Process  Coherency Circumstantial  Content WDL  Delusions None reported or observed  Perception WDL  Hallucination None reported or observed  Judgment Poor  Confusion WDL  Danger to Self  Current suicidal ideation? Denies  Danger to Others  Danger to Others None reported or observed    

## 2022-05-07 DIAGNOSIS — F201 Disorganized schizophrenia: Secondary | ICD-10-CM | POA: Diagnosis not present

## 2022-05-07 LAB — VALPROIC ACID LEVEL: Valproic Acid Lvl: 130 ug/mL — ABNORMAL HIGH (ref 50.0–100.0)

## 2022-05-07 MED ORDER — DIVALPROEX SODIUM 500 MG PO DR TAB
500.0000 mg | DELAYED_RELEASE_TABLET | Freq: Two times a day (BID) | ORAL | Status: DC
  Administered 2022-05-07 – 2022-05-10 (×6): 500 mg via ORAL
  Filled 2022-05-07 (×5): qty 1
  Filled 2022-05-07: qty 20
  Filled 2022-05-07 (×2): qty 1
  Filled 2022-05-07: qty 20
  Filled 2022-05-07 (×2): qty 1

## 2022-05-07 NOTE — Progress Notes (Signed)
Adult Psychoeducational Group Note  Date:  05/07/2022 Time:  8:17 PM  Group Topic/Focus:  Wrap-Up Group:   The focus of this group is to help patients review their daily goal of treatment and discuss progress on daily workbooks.  Participation Level:  Active  Participation Quality:  Appropriate  Affect:  Appropriate  Cognitive:  Appropriate  Insight: Appropriate  Engagement in Group:  Engaged  Modes of Intervention:  Discussion  Additional Comments:   Pt states that he had a good day. Pt states that he hasn't had any problems throughout the day and has been able to sleep today. Pt states he is ready to go home and continue his medication.  Brendan Hogan 05/07/2022, 8:17 PM

## 2022-05-07 NOTE — Progress Notes (Signed)
Pt visible in milieu at intervals. Denies SI, HI, AVH and pain, none noted when assessed. Presents animated with logical speech, fair eye contact, seems to be in good spirits and engaged appropriately in milieu. However, pt declined scheduled groups despite multiple prompts. Reports he slept well last night with good appetite. Took his medications when offered, mouth checks done to ensure compliance. Support, encouragement and reassurance provided to pt this shift. Safety checks maintained at Q 15 minutes intervals without incident. Pt tolerates meals, fluids and medications well. Denies concerns at this time.

## 2022-05-07 NOTE — Group Note (Signed)
Recreation Therapy Group Note   Group Topic:Communication  Group Date: 05/07/2022 Start Time: 1000 End Time: 1035 Facilitators: Tangela Dolliver-McCall, LRT,CTRS Location: [address] Dayroom   Goal Area(s) Addresses:  Patient will effectively listen to complete activity.  Patient will identify communication skills used to make activity successful.  Patient will identify how skills used during activity can be used to reach post d/c goals.    Group Description: Geometric Drawings.  Three volunteers from the peer group will be shown an abstract picture with a particular arrangement of geometrical shapes.  Each round, one 'speaker' will describe the pattern, as accurately as possible without revealing the image to the group.  The remaining group members will listen and draw the picture to reflect how it is described to them. Patients with the role of 'listener' cannot ask clarifying questions but, may request that the speaker repeat a direction. Once the drawings are complete, the presenter will show the rest of the group the picture and compare how close each person came to drawing the picture. LRT will facilitate a post-activity discussion regarding effective communication and the importance of planning, listening, and asking for clarification in daily interactions with others.   Affect/Mood: N/A   Participation Level: Did not attend    Clinical Observations/Individualized Feedback:     Plan: Continue to engage patient in RT group sessions 2-3x/week.   Jacqui Headen-McCall, LRT,CTRS 05/07/2022 11:04 AM

## 2022-05-07 NOTE — [facility] Group Notes (Signed)
Child/Adolescent Psychoeducational Group Note  Date:  05/07/2022 Time:  1:21 PM  Group Topic/Focus:  Goals Group:   The focus of this group is to help patients establish daily goals to achieve during treatment and discuss how the patient can incorporate goal setting into their daily lives to aide in recovery.  Participation Level:  Active  Participation Quality:  Attentive  Affect:  Appropriate  Cognitive:  Appropriate  Insight:  Appropriate  Engagement in Group:  Engaged  Modes of Intervention:  Discussion  Additional Comments:    Patient was told multiple times that group was starting but still did not attend.    Mikalah Skyles T Wisdom Rickey 05/07/2022, 1:21 PM

## 2022-05-07 NOTE — B[facility] Counselor (Signed)
[facility] LCSW Progress Note   05/07/2022    1:26 PM  Brendan Hogan   [id number]   Type of Contact and Topic:  [facility] referral  CSW called admission coordinator, Phineas Real, regarding admission.  There is a week and a half quarantine for admission. CSW had to leave a message regarding if patient is appropriate for admission.  CSW awaiting call back.     Signed:  Riki Altes MSW, LCSW, LCAS 05/07/2022 1:26 PM

## 2022-05-07 NOTE — Progress Notes (Signed)
   05/07/22 0601  15 Minute Checks  Location Bedroom  Visual Appearance Calm  Behavior Sleeping  Sleep (Behavioral Health Patients Only)  Calculate sleep? (Click Yes once per 24 hr at 0600 safety check) Yes  Documented sleep last 24 hours 13.75

## 2022-05-07 NOTE — Progress Notes (Signed)
[name] Progress Note  05/07/2022 5:38 PM Brendan Hogan  MRN:  [id number]  Subjective:    Brendan Hogan is a 27 y.o., male with a past psychiatric history significant for schizophrenia who was admitted to the psychiatric hospital under IVC for bizarre behaviors, agitation, appearing to respond to internal stimuli.    On evaluation today, psychiatrically patient is much improved.  There is no appreciable psychosis.  Mood is stable.  Denies depression.  Reports sleeping well.  He is still asking for Ambien and Ativan for sleep.  Throughout the hospitalization he has slept well without any kind of benzodiazepine or z drug. Denies SI, HI. Denies that to current psychiatric medications. Signs of Depakote toxicity or elevated ammonia.  We discussed the valproic acid level was high, we will need to reduce the dose of the Depakote going forward, the patient is agreeable. He is still agreeable with substance use treatment, as next step after discharge from this hospital.   Principal Problem: Schizophrenia ([place]) Diagnosis: Principal Problem:   Schizophrenia ([place])  Total Time spent with patient: 15 minutes  Past Psychiatric History:  Prior Psychiatric diagnoses: Schizophrenia Past Psychiatric Hospitalizations: Multiple, last hospitalization September 2023 at [facility]    History of self mutilation: None noted Past suicide attempts: Patient reports 1 suicide attempt but "long time ago" but unable to elaborate Past history of HI, violent or aggressive behavior: Per chart review history of violence and agitation towards family members   Past Psychiatric medications trials: Haldol caused EPS and neck stiffness best response was obtained from [facility]il[facility] and [place] but patient is noncompliant, risperidone and Risperdal Consta were prescribed in the past but unable to confirm if was effective. Patient was most recently discharged on Prolixin oral, Prolixin LAI, Depakote, Ativan, and Ambien    History of ECT/TMS: Patient denies   Outpatient psychiatric Follow up: [facility]  Prior Outpatient Therapy: Unknown  Past Medical History:  Past Medical History:  Diagnosis Date   Psychosis ([place]) 05/2019   [place]    Seasonal allergies     Past Surgical History:  Procedure Laterality Date   TONSILLECTOMY     Family History:  Family History  Problem Relation Age of Onset   Hypertension Mother    Family Psychiatric  History:  Psychiatric illness: Patient denies Suicide: Patient denies Substance Abuse: Patient denies    Social History:  Social History   Substance and Sexual Activity  Alcohol Use Not Currently   Comment: rare     Social History   Substance and Sexual Activity  Drug Use No    Social History   Socioeconomic History   Marital status: Single    Spouse name: Not on file   Number of children: Not on file   Years of education: Not on file   Highest education level: Not on file  Occupational History   Not on file  Tobacco Use   Smoking status: Every Day    Packs/day: 1.00    Types: Cigarettes   Smokeless tobacco: Former  Scientific laboratory technician Use: Never used  Substance and Sexual Activity   Alcohol use: Not Currently    Comment: rare   Drug use: No   Sexual activity: Not Currently  Other Topics Concern   Not on file  Social History Narrative   Not on file   Social Determinants of Health   Financial Resource Strain: Not on file  Food Insecurity: Unknown (05/01/2022)   Hunger Vital  Sign    Worried About Programme researcher, broadcasting/film/video in the Last Year: Patient refused    Ran Out of Food in the Last Year: Patient refused  Transportation Needs: Unknown (05/01/2022)   PRAPARE - Administrator, Civil Service (Medical): Patient refused    Lack of Transportation (Non-Medical): Patient refused  Physical Activity: Not on file  Stress: Not on file  Social Connections: Not on file   Additional Social History:                          Sleep: Fair  Appetite:  Fair  Current Medications: Current Facility-Administered Medications  Medication Dose Route Frequency Provider Last Rate Last Admin   acetaminophen (TYLENOL) tablet 650 mg  650 mg Oral Q6H PRN Motley-Mangrum, Jadeka A, PMHNP       benztropine (COGENTIN) tablet 1 mg  1 mg Oral BID Motley-Mangrum, Jadeka A, PMHNP   1 mg at 05/07/22 1643   divalproex (DEPAKOTE) DR tablet 500 mg  500 mg Oral Q12H Salley Boxley, Harrold Donath, MD       fluPHENAZine decanoate (PROLIXIN) injection 25 mg  25 mg Intramuscular Q14 Days Kimble Delaurentis, Harrold Donath, MD   25 mg at 05/03/22 1207   hydrOXYzine (ATARAX) tablet 25 mg  25 mg Oral TID PRN Motley-Mangrum, Jadeka A, PMHNP   25 mg at 05/05/22 2351   magnesium hydroxide (MILK OF MAGNESIA) suspension 30 mL  30 mL Oral Daily PRN Motley-Mangrum, Jadeka A, PMHNP       mirtazapine (REMERON SOL-TAB) disintegrating tablet 15 mg  15 mg Oral QHS [place] Huberty, Harrold Donath, MD   15 mg at 05/06/22 2040   nicotine polacrilex (NICORETTE) gum 2 mg  2 mg Oral PRN Motley-Mangrum, Jadeka A, PMHNP   2 mg at 05/07/22 1643   OLANZapine (ZYPREXA) injection 10 mg  10 mg Intramuscular BID PRN Atziri Zubiate, Harrold Donath, MD       OLANZapine zydis (ZYPREXA) disintegrating tablet 10 mg  10 mg Oral Once PRN Motley-Mangrum, Jadeka A, PMHNP       traZODone (DESYREL) tablet 50 mg  50 mg Oral QHS PRN Motley-Mangrum, Jadeka A, PMHNP   50 mg at 05/05/22 2352    Lab Results:  Results for orders placed or performed during the hospital encounter of 05/01/22 (from the past 48 hour(s))  Valproic acid level     Status: Abnormal   Collection Time: 05/07/22  6:46 AM  Result Value Ref Range   Valproic Acid Lvl 130 (H) 50.0 - 100.0 ug/mL    Comment: Performed at [facility], [address] [place], [place] [address]     Blood Alcohol level:  Lab Results  Component Value Date   ETH <10 05/01/2022   ETH <10 03/11/2022    Metabolic Disorder Labs: Lab Results  Component Value  Date   HGBA1C 5.4 05/02/2022   MPG 108 05/02/2022   MPG 91.06 01/23/2022   Lab Results  Component Value Date   PROLACTIN 26.5 (H) 09/08/2019   Lab Results  Component Value Date   CHOL 162 05/02/2022   TRIG 71 05/02/2022   HDL 52 05/02/2022   CHOLHDL 3.1 05/02/2022   VLDL 14 05/02/2022   LDLCALC 96 05/02/2022   LDLCALC 91 01/23/2022    Physical Findings: AIMS: Facial and Oral Movements Muscles of Facial Expression: None, normal Lips and Perioral Area: None, normal Jaw: None, normal Tongue: None, normal,Extremity Movements Upper (arms, wrists, hands, fingers): None, normal Lower (legs, knees, ankles,  toes): None, normal, Trunk Movements Neck, shoulders, hips: None, normal, Overall Severity Severity of abnormal movements (highest score from questions above): None, normal Incapacitation due to abnormal movements: None, normal Patient's awareness of abnormal movements (rate only patient's report): No Awareness, Dental Status Current problems with teeth and/or dentures?: No Does patient usually wear dentures?: No  CIWA:    COWS:     Musculoskeletal: Strength & Muscle Tone: Laying in bed  Gait & Station: Laying in bed  Patient leans: Laying in bed   Psychiatric Specialty Exam:  Presentation  General Appearance:  Laying on ground, blanket over head  Eye Contact: Fair  Speech: Slow  Speech Volume: Decreased  Handedness: Right   Mood and Affect  Mood: Anxious  Affect: Constricted   Thought Process  Thought Processes: Disorganized  Descriptions of Associations:Tangential  Orientation:Full (Time, Place and Person)  Thought Content:Rumination; Scattered; Perseveration  History of Schizophrenia/Schizoaffective disorder:Yes  Duration of Psychotic Symptoms:Greater than six months  Hallucinations:Hallucinations: Auditory Description of Auditory Hallucinations: Improving hallucinations by self report but still appears to be occationally  responding.   Ideas of Reference:Delusions  Suicidal Thoughts:Suicidal Thoughts: No   Homicidal Thoughts:Homicidal Thoughts: No    Sensorium  Memory: Immediate Fair; Recent Fair; Remote Fair  Judgment: Fair  Insight: Fair   Chartered certified accountant: Fair  Attention Span: Fair  Recall: [employer] of Knowledge: Fair  Language: Fair   Psychomotor Activity  Psychomotor Activity: Psychomotor Activity: Normal    Assets  Assets: Desire for Improvement; Financial Resources/Insurance; Housing; Social Support; Transportation   Sleep  Sleep: Sleep: Good Number of Hours of Sleep: 11     Physical Exam: Physical Exam Vitals reviewed.  Constitutional:      General: He is not in acute distress.    Appearance: He is normal weight. He is not toxic-appearing.  Neurological:     Mental Status: He is alert.    Review of Systems  Constitutional:  Negative for chills and fever.  Cardiovascular:  Negative for chest pain and palpitations.  Neurological:  Negative for dizziness, tingling, tremors and headaches.  Psychiatric/Behavioral:  Positive for substance abuse. Negative for depression, hallucinations and suicidal ideas. The patient is not nervous/anxious and does not have insomnia.    Blood pressure 101/67, pulse 98, temperature 98.1 F (36.7 C), temperature source Oral, resp. rate 16, height 5\' 5"  (1.651 m), weight 63 kg, SpO2 100 %. Body mass index is 23.13 kg/m.   Treatment Plan Summary: Daily contact with patient to assess and evaluate symptoms and progress in treatment Treatment Plan Summary: Daily contact with patient to assess and evaluate symptoms and progress in treatment     ASSESSMENT:   Diagnoses / Active Problems: -Schizophrenia    PLAN: Safety and Monitoring:             -- Involuntary admission to inpatient psychiatric unit for safety, stabilization and treatment             -- Daily contact with patient to assess and  evaluate symptoms and progress in treatment             -- Patient's case to be discussed in multi-disciplinary team meeting             -- Observation Level : q15 minute checks             -- Vital signs:  q12 hours             -- Precautions: suicide, elopement, and assault  2. Psychiatric Diagnoses and Treatment:               -Prolixin 25 mg IM LAI administered last on 05-03-22  -Decrease depakote from 750 mg bid to 500 mg bid for mood stabilization - previously effective with prolixin during last admission.  -Continue cogentin 1 mg bid for eps ppx  -Previously discontinued ambien -Continue remeron 15 mg qhs  -Continue zyprexa oral and IM PRN for agitation    --  The risks/benefits/side-effects/alternatives to this medication were discussed in detail with the patient and time was given for questions. The patient consents to medication trial.                -- Metabolic profile and EKG monitoring obtained while on an atypical antipsychotic (BMI: Lipid Panel: HbgA1c: QTc:)              -- Encouraged patient to participate in unit milieu and in scheduled group therapies                          3. Medical Issues Being Addressed:              Tobacco Use Disorder             -- Nicotine gum PRN              -- Smoking cessation encouraged   4. Discharge Planning:              -- Social work and case management to assist with discharge planning and identification of hospital follow-up needs prior to discharge             -- Estimated LOS: 5-7 days             -- Discharge Concerns: Need to establish a safety plan; Medication compliance and effectiveness             -- Discharge Goals: Return home with outpatient referrals for mental health follow-up including medication management/psychotherapy  I certify that inpatient services furnished can reasonably be expected to improve the patient's condition.    Cristy Hilts, MD 05/07/2022, 5:38 PM  Total Time Spent in Direct Patient  Care:  I personally spent 35 minutes on the unit in direct patient care. The direct patient care time included face-to-face time with the patient, reviewing the patient's chart, communicating with other professionals, and coordinating care. Greater than 50% of this time was spent in counseling or coordinating care with the patient regarding goals of hospitalization, psycho-education, and discharge planning needs.   Phineas Inches, MD Psychiatrist

## 2022-05-08 ENCOUNTER — Encounter (HOSPITAL_COMMUNITY): Payer: Self-pay

## 2022-05-08 DIAGNOSIS — F201 Disorganized schizophrenia: Secondary | ICD-10-CM | POA: Diagnosis not present

## 2022-05-08 NOTE — Progress Notes (Signed)
Pt A & O to place, situation and person. Denies SI, HI, AVH and pain when assessed. Remains medication compliant with mouth checks, denies adverse drug reactions thus far. Reports he slept well last night with good appetite. Verbalized concerns about d/c date. Affect is animated with fair eye contact, logical speech, interacts appropriately with peers and staff. Refused to attend scheduled groups. Tolerates meals and fluids well. Support, encouragement and reassurance provided to pt. D/C concerns validated. Safety maintained in milieu without outburst.

## 2022-05-08 NOTE — Progress Notes (Signed)
[facility] MD Progress Note  05/08/2022 12:46 PM Brendan Hogan  MRN:  [id number]  Subjective:    Brendan Hogan is a 27 y.o., male with a past psychiatric history significant for schizophrenia who was admitted to the psychiatric hospital under IVC for bizarre behaviors, agitation, appearing to respond to internal stimuli.    On evaluation today, psychiatrically patient is much improved and appears at psychiatric baseline.  There is no appreciable psychosis.  Mood is stable.  Denies depression.  Reports sleeping well w/o benzo or ativan.  Denies SI, HI. Denies that to current psychiatric medications.  We discussed that we will get [facility] level on Friday then plan for dc.  Updated pt that we are awaiting if [facility] has accepted the pt for substance use treatment, and informed him nonetheless the facility is under quarantine for 10 days. He is still agreeable with substance use treatment, as next step after discharge from this hospital.  I tried to call the father at 1245pm today, who is legal gaurdian to discuss discharge - I left a message that we plan to dc on Friday.    Principal Problem: Schizophrenia (HCC) Diagnosis: Principal Problem:   Schizophrenia (HCC)  Total Time spent with patient: 15 minutes  Past Psychiatric History:  Prior Psychiatric diagnoses: Schizophrenia Past Psychiatric Hospitalizations: Multiple, last hospitalization September 2023 at [facility]    History of self mutilation: None noted Past suicide attempts: Patient reports 1 suicide attempt but "long time ago" but unable to elaborate Past history of HI, violent or aggressive behavior: Per chart review history of violence and agitation towards family members   Past Psychiatric medications trials: Haldol caused EPS and neck stiffness best response was obtained from Abilify and Abilify Maintena but patient is noncompliant, risperidone and Risperdal Consta were prescribed in the past but unable to confirm if was  effective. Patient was most recently discharged on Prolixin oral, Prolixin LAI, Depakote, Ativan, and Ambien   History of ECT/TMS: Patient denies   Outpatient psychiatric Follow up: [facility]  Prior Outpatient Therapy: Unknown  Past Medical History:  Past Medical History:  Diagnosis Date   Psychosis (HCC) 05/2019   [facility]    Seasonal allergies     Past Surgical History:  Procedure Laterality Date   TONSILLECTOMY     Family History:  Family History  Problem Relation Age of Onset   Hypertension Mother    Family Psychiatric  History:  Psychiatric illness: Patient denies Suicide: Patient denies Substance Abuse: Patient denies    Social History:  Social History   Substance and Sexual Activity  Alcohol Use Not Currently   Comment: rare     Social History   Substance and Sexual Activity  Drug Use No    Social History   Socioeconomic History   Marital status: Single    Spouse name: Not on file   Number of children: Not on file   Years of education: Not on file   Highest education level: Not on file  Occupational History   Not on file  Tobacco Use   Smoking status: Every Day    Packs/day: 1.00    Types: Cigarettes   Smokeless tobacco: Former  Building services engineer Use: Never used  Substance and Sexual Activity   Alcohol use: Not Currently    Comment: rare   Drug use: No   Sexual activity: Not Currently  Other Topics Concern   Not on file  Social History Narrative   Not  on file   Social Determinants of Health   Financial Resource Strain: Not on file  Food Insecurity: Unknown (05/01/2022)   Hunger Vital Sign    Worried About Running Out of Food in the Last Year: Patient refused    [place] in the Last Year: Patient refused  Transportation Needs: Unknown (05/01/2022)   [place] - Hydrologist (Medical): Patient refused    Lack of Transportation (Non-Medical): Patient refused  Physical Activity: Not on file   Stress: Not on file  Social Connections: Not on file   Additional Social History:                         Sleep: Fair  Appetite:  Fair  Current Medications: Current Facility-Administered Medications  Medication Dose Route Frequency Provider Last Rate Last Admin   acetaminophen (TYLENOL) tablet 650 mg  650 mg Oral Q6H PRN Motley-Mangrum, Jadeka A, PMHNP       benztropine (COGENTIN) tablet 1 mg  1 mg Oral BID Motley-Mangrum, Jadeka A, PMHNP   1 mg at 05/08/22 0800   divalproex (DEPAKOTE) DR tablet 500 mg  500 mg Oral Q12H Ayan Heffington, Ovid Curd, MD   500 mg at 05/08/22 0800   fluPHENAZine decanoate (PROLIXIN) injection 25 mg  25 mg Intramuscular Q14 Days Inita Uram, Ovid Curd, MD   25 mg at 05/03/22 1207   hydrOXYzine (ATARAX) tablet 25 mg  25 mg Oral TID PRN Motley-Mangrum, Jadeka A, PMHNP   25 mg at 05/07/22 2033   magnesium hydroxide (MILK OF MAGNESIA) suspension 30 mL  30 mL Oral Daily PRN Motley-Mangrum, Jadeka A, PMHNP       mirtazapine (REMERON SOL-TAB) disintegrating tablet 15 mg  15 mg Oral QHS Synthia Fairbank, Ovid Curd, MD   15 mg at 05/07/22 2034   nicotine polacrilex (NICORETTE) gum 2 mg  2 mg Oral PRN Motley-Mangrum, Jadeka A, PMHNP   2 mg at 05/07/22 1643   OLANZapine (ZYPREXA) injection 10 mg  10 mg Intramuscular BID PRN Chanel Mckesson, Ovid Curd, MD       OLANZapine zydis (ZYPREXA) disintegrating tablet 10 mg  10 mg Oral Once PRN Motley-Mangrum, Jadeka A, PMHNP       traZODone (DESYREL) tablet 50 mg  50 mg Oral QHS PRN Motley-Mangrum, Jadeka A, PMHNP   50 mg at 05/07/22 2033    Lab Results:  Results for orders placed or performed during the hospital encounter of 05/01/22 (from the past 48 hour(s))  Valproic acid level     Status: Abnormal   Collection Time: 05/07/22  6:46 AM  Result Value Ref Range   Valproic Acid Lvl 130 (H) 50.0 - 100.0 ug/mL    Comment: Performed at [facility], [place] [address] [place], [place] [address]     Blood Alcohol level:  Lab  Results  Component Value Date   [facility] <10 05/01/2022   ETH <10 10/27/1599    Metabolic Disorder Labs: Lab Results  Component Value Date   HGBA1C 5.4 05/02/2022   MPG 108 05/02/2022   MPG 91.06 01/23/2022   Lab Results  Component Value Date   PROLACTIN 26.5 (H) 09/08/2019   Lab Results  Component Value Date   CHOL 162 05/02/2022   TRIG 71 05/02/2022   HDL 52 05/02/2022   CHOLHDL 3.1 05/02/2022   VLDL 14 05/02/2022   LDLCALC 96 05/02/2022   LDLCALC 91 01/23/2022    Physical Findings: AIMS: Facial and Oral Movements Muscles of  Facial Expression: None, normal Lips and Perioral Area: None, normal Jaw: None, normal Tongue: None, normal,Extremity Movements Upper (arms, wrists, hands, fingers): None, normal Lower (legs, knees, ankles, toes): None, normal, Trunk Movements Neck, shoulders, hips: None, normal, Overall Severity Severity of abnormal movements (highest score from questions above): None, normal Incapacitation due to abnormal movements: None, normal Patient's awareness of abnormal movements (rate only patient's report): No Awareness, Dental Status Current problems with teeth and/or dentures?: No Does patient usually wear dentures?: No  CIWA:    COWS:     Musculoskeletal: Strength & Muscle Tone: Laying in bed  Gait & Station: Laying in bed  Patient leans: Laying in bed   Psychiatric Specialty Exam:  Presentation  General Appearance:  Laying on ground, blanket over head  Eye Contact: Fair  Speech: Slow  Speech Volume: Decreased  Handedness: Right   Mood and Affect  Mood: Anxious  Affect: Full rance   Thought Process  Thought Processes: Disorganized  Descriptions of Associations:linear  Orientation:Full (Time, Place and Person)  Thought Content:Rumination; Scattered; Perseveration  History of Schizophrenia/Schizoaffective disorder:Yes  Duration of Psychotic Symptoms:Greater than six months  Hallucinations:No data recorded Denies  AH, VH  Ideas of Reference:none  Suicidal Thoughts:No data recorded denies  Homicidal Thoughts:No data recorded Denies    Sensorium  Memory: Immediate Fair; Recent Fair; Remote Fair  Judgment: Fair  Insight: Fair   Chartered certified accountant: Fair  Attention Span: Fair  Recall: [employer] of Knowledge: Fair  Language: Fair   Psychomotor Activity  Psychomotor Activity: No data recorded Nml. No eps.    Assets  Assets: Desire for Improvement; Financial Resources/Insurance; Housing; Social Support; Transportation   Sleep  Sleep: No data recorded Stable    Physical Exam: Physical Exam Vitals reviewed.  Constitutional:      General: He is not in acute distress.    Appearance: He is normal weight. He is not toxic-appearing.  Neurological:     Mental Status: He is alert.    Review of Systems  Constitutional:  Negative for chills and fever.  Cardiovascular:  Negative for chest pain and palpitations.  Neurological:  Negative for dizziness, tingling, tremors and headaches.  Psychiatric/Behavioral:  Positive for substance abuse. Negative for depression, hallucinations and suicidal ideas. The patient is not nervous/anxious and does not have insomnia.    Blood pressure 100/68, pulse 94, temperature (!) 97.5 F (36.4 C), temperature source Oral, resp. rate 16, height 5\' 5"  (1.651 m), weight 63 kg, SpO2 99 %. Body mass index is 23.13 kg/m.    Treatment Plan Summary: Daily contact with patient to assess and evaluate symptoms and progress in treatment     ASSESSMENT:   Diagnoses / Active Problems: -Schizophrenia    PLAN: Safety and Monitoring:             -- Involuntary admission to inpatient psychiatric unit for safety, stabilization and treatment             -- Daily contact with patient to assess and evaluate symptoms and progress in treatment             -- Patient's case to be discussed in multi-disciplinary team meeting              -- Observation Level : q15 minute checks             -- Vital signs:  q12 hours             -- Precautions: suicide, elopement, and  assault   2. Psychiatric Diagnoses and Treatment:               -Prolixin 25 mg IM LAI administered last on 05-03-22  -Continue depakote DR 500 mg bid for mood stabilization - previously effective with prolixin during last admission.  -Continue cogentin 1 mg bid for eps ppx  -Previously discontinued ambien on admission  -Continue remeron 15 mg qhs  -Continue zyprexa oral and IM PRN for agitation    --  The risks/benefits/side-effects/alternatives to this medication were discussed in detail with the patient and time was given for questions. The patient consents to medication trial.                -- Metabolic profile and EKG monitoring obtained while on an atypical antipsychotic (BMI: Lipid Panel: HbgA1c: QTc:)              -- Encouraged patient to participate in unit milieu and in scheduled group therapies                          3. Medical Issues Being Addressed:              Tobacco Use Disorder             -- Nicotine gum PRN              -- Smoking cessation encouraged   4. Discharge Planning:              -- Social work and case management to assist with discharge planning and identification of hospital follow-up needs prior to discharge             -- Estimated LOS: 5-7 days             -- Discharge Concerns: Need to establish a safety plan; Medication compliance and effectiveness             -- Discharge Goals: Return home with outpatient referrals for mental health follow-up including medication management/psychotherapy  I certify that inpatient services furnished can reasonably be expected to improve the patient's condition.    Christoper Allegra, MD 05/08/2022, 12:46 PM  Total Time Spent in Direct Patient Care:  I personally spent 25 minutes on the unit in direct patient care. The direct patient care time included face-to-face time with  the patient, reviewing the patient's chart, communicating with other professionals, and coordinating care. Greater than 50% of this time was spent in counseling or coordinating care with the patient regarding goals of hospitalization, psycho-education, and discharge planning needs.   Janine Limbo, MD Psychiatrist

## 2022-05-08 NOTE — Group Note (Signed)
Recreation Therapy Group Note   Group Topic:Goal Setting  Group Date: 05/08/2022 Start Time: 1005 End Time: 1050 Facilitators: Tywaun Hiltner-McCall, LRT,CTRS Location: [address] Dayroom   Goal Area(s) Addresses:  Patient will identify the importance of setting goals Patient will participate in discussion of what a goal is. Patient will successfully identify four goals to work towards.  Group Description: Goals.  LRT and patients discussed the importance of goals.  Patients were then given a worksheet in which they had to identify a goal they wanted to accomplish in a week, month, year and 5 years.  Patients then had to identify any obstacles they would face, what they needed to achieve goals and what they can start doing now to work towards goals.   Affect/Mood: N/A   Participation Level: Did not attend    Clinical Observations/Individualized Feedback:      Plan: Continue to engage patient in RT group sessions 2-3x/week.   Avelina Mcclurkin-McCall, LRT,CTRS 05/08/2022 12:49 PM

## 2022-05-08 NOTE — Group Note (Signed)
LCSW Group Therapy Note   Group Date: 05/08/2022 Start Time: 1300 End Time: 1400   Type of Therapy and Topic:  Group Therapy: Boundaries  Participation Level:  Did Not Attend  Description of Group: This group will address the use of boundaries in their personal lives. Patients will explore why boundaries are important, the difference between healthy and unhealthy boundaries, and negative and postive outcomes of different boundaries and will look at how boundaries can be crossed.  Patients will be encouraged to identify current boundaries in their own lives and identify what kind of boundary is being set. Facilitators will guide patients in utilizing problem-solving interventions to address and correct types boundaries being used and to address when no boundary is being used. Understanding and applying boundaries will be explored and addressed for obtaining and maintaining a balanced life. Patients will be encouraged to explore ways to assertively make their boundaries and needs known to significant others in their lives, using other group members and facilitator for role play, support, and feedback.  Therapeutic Goals:  1.  Patient will identify areas in their life where setting clear boundaries could be  used to improve their life.  2.  Patient will identify signs/triggers that a boundary is not being respected. 3.  Patient will identify two ways to set boundaries in order to achieve balance in  their lives: 4.  Patient will demonstrate ability to communicate their needs and set boundaries  through discussion and/or role plays  Summary of Patient Progress:  Did Not Attend    Therapeutic Modalities:    Cognitive Behavioral Therapy Solution-Focused Therapy  Sherre Lain, [place] 05/08/2022  2:27 PM

## 2022-05-08 NOTE — Progress Notes (Signed)
     05/08/22 2200  Psychosocial Assessment  Patient Complaints None  Eye Contact Fair  Facial Expression Animated;Anxious  Affect Appropriate to circumstance  Speech Logical/coherent  Interaction Assertive  Motor Activity Other (Comment) (WDL)  Appearance/Hygiene Unremarkable  Behavior Characteristics Cooperative;Appropriate to situation  Mood Pleasant;Anxious  Thought Process  Coherency Circumstantial  Content WDL  Delusions None reported or observed  Perception WDL  Hallucination None reported or observed  Judgment WDL  Confusion None  Danger to Self  Current suicidal ideation? Denies  Danger to Others  Danger to Others None reported or observed

## 2022-05-08 NOTE — [facility] Counselor (Signed)
[facility] LCSW Progress Note   05/08/2022    1:06 PM  Brendan Hogan   [id number]   Type of Contact and Topic:  [facility] and ACTT services  Patient has been denied from [facility] due to being on injections.  CSW informed the provider.  CSW also reached out to psychotherapeutic services and they agreed to come and do an assessment with a patient at 1pm.     Signed:  Riki Altes MSW, LCSW, LCAS 05/08/2022 1:06 PM

## 2022-05-08 NOTE — BH IP Treatment Plan (Signed)
Interdisciplinary Treatment and Diagnostic Plan Update  05/08/2022 Time of Session: 10:15am  Brendan Hogan MRN: [id number]  Principal Diagnosis: Schizophrenia [facility])  Secondary Diagnoses: Principal Problem:   Schizophrenia ([place])   Current Medications:  Current Facility-Administered Medications  Medication Dose Route Frequency Provider Last Rate Last Admin   acetaminophen (TYLENOL) tablet 650 mg  650 mg Oral Q6H PRN Motley-Mangrum, Jadeka A, PMHNP       benztropine (COGENTIN) tablet 1 mg  1 mg Oral BID Motley-Mangrum, Jadeka A, PMHNP   1 mg at 05/08/22 0800   divalproex (DEPAKOTE) DR tablet 500 mg  500 mg Oral Q12H Massengill, Ovid Curd, MD   500 mg at 05/08/22 0800   fluPHENAZine decanoate (PROLIXIN) injection 25 mg  25 mg Intramuscular Q14 Days Massengill, Ovid Curd, MD   25 mg at 05/03/22 1207   hydrOXYzine (ATARAX) tablet 25 mg  25 mg Oral TID PRN Motley-Mangrum, Jadeka A, PMHNP   25 mg at 05/07/22 2033   magnesium hydroxide (MILK OF MAGNESIA) suspension 30 mL  30 mL Oral Daily PRN Motley-Mangrum, Jadeka A, PMHNP       mirtazapine (REMERON SOL-TAB) disintegrating tablet 15 mg  15 mg Oral QHS Massengill, Ovid Curd, MD   15 mg at 05/07/22 2034   nicotine polacrilex (NICORETTE) gum 2 mg  2 mg Oral PRN Motley-Mangrum, Jadeka A, PMHNP   2 mg at 05/07/22 1643   OLANZapine (ZYPREXA) injection 10 mg  10 mg Intramuscular BID PRN Massengill, Ovid Curd, MD       OLANZapine zydis (ZYPREXA) disintegrating tablet 10 mg  10 mg Oral Once PRN Motley-Mangrum, Jadeka A, PMHNP       traZODone (DESYREL) tablet 50 mg  50 mg Oral QHS PRN Motley-Mangrum, Jadeka A, PMHNP   50 mg at 05/07/22 2033   PTA Medications: Medications Prior to Admission  Medication Sig Dispense Refill Last Dose   divalproex (DEPAKOTE ER) 500 MG 24 hr tablet Take 1,000 mg by mouth daily.      mirtazapine (REMERON) 15 MG tablet Take 15 mg by mouth at bedtime.      zolpidem (AMBIEN) 10 MG tablet Take 10 mg by mouth at bedtime.      benztropine  (COGENTIN) 1 MG tablet Take 1 tablet (1 mg total) by mouth 2 (two) times daily. 60 tablet 3    cloNIDine (CATAPRES) 0.1 MG tablet Take 1 tablet (0.1 mg total) by mouth 2 (two) times daily. 60 tablet 3    fluPHENAZine decanoate (PROLIXIN) 25 MG/ML injection Inject 1 mL (25 mg total) into the muscle every 21 ( twenty-one) days. NEXT ADMINISTRATION IS DUE ON 02-25-2022. 5 mL 11     Patient Stressors: Marital or family conflict   Medication change or noncompliance    Patient Strengths: Armed forces logistics/support/administrative officer  Supportive family/friends   Treatment Modalities: Medication Management, Group therapy, Case management,  1 to 1 session with clinician, Psychoeducation, Recreational therapy.   Physician Treatment Plan for Primary Diagnosis: Schizophrenia ([place]) Long Term Goal(s): Improvement in symptoms so as ready for discharge   Short Term Goals: Ability to identify changes in lifestyle to reduce recurrence of condition will improve Ability to verbalize feelings will improve Ability to disclose and discuss suicidal ideas Ability to demonstrate self-control will improve Ability to identify and develop effective coping behaviors will improve Ability to maintain clinical measurements within normal limits will improve Compliance with prescribed medications will improve Ability to identify triggers associated with substance abuse/mental health issues will improve  Medication Management: Evaluate patient's response, side effects,  and tolerance of medication regimen.  Therapeutic Interventions: 1 to 1 sessions, Unit Group sessions and Medication administration.  Evaluation of Outcomes: Progressing  Physician Treatment Plan for Secondary Diagnosis: Principal Problem:   Schizophrenia ([place])  Long Term Goal(s): Improvement in symptoms so as ready for discharge   Short Term Goals: Ability to identify changes in lifestyle to reduce recurrence of condition will improve Ability to verbalize feelings will  improve Ability to disclose and discuss suicidal ideas Ability to demonstrate self-control will improve Ability to identify and develop effective coping behaviors will improve Ability to maintain clinical measurements within normal limits will improve Compliance with prescribed medications will improve Ability to identify triggers associated with substance abuse/mental health issues will improve     Medication Management: Evaluate patient's response, side effects, and tolerance of medication regimen.  Therapeutic Interventions: 1 to 1 sessions, Unit Group sessions and Medication administration.  Evaluation of Outcomes: Progressing   RN Treatment Plan for Primary Diagnosis: Schizophrenia ([place]) Long Term Goal(s): Knowledge of disease and therapeutic regimen to maintain health will improve  Short Term Goals: Ability to remain free from injury will improve, Ability to participate in decision making will improve, Ability to verbalize feelings will improve, Ability to disclose and discuss suicidal ideas, and Ability to identify and develop effective coping behaviors will improve  Medication Management: RN will administer medications as ordered by provider, will assess and evaluate patient's response and provide education to patient for prescribed medication. RN will report any adverse and/or side effects to prescribing provider.  Therapeutic Interventions: 1 on 1 counseling sessions, Psychoeducation, Medication administration, Evaluate responses to treatment, Monitor vital signs and CBGs as ordered, Perform/monitor CIWA, COWS, AIMS and Fall Risk screenings as ordered, Perform wound care treatments as ordered.  Evaluation of Outcomes: Progressing   LCSW Treatment Plan for Primary Diagnosis: Schizophrenia ([place]) Long Term Goal(s): Safe transition to appropriate next level of care at discharge, Engage patient in therapeutic group addressing interpersonal concerns.  Short Term Goals: Engage  patient in aftercare planning with referrals and resources, Increase social support, Increase emotional regulation, Facilitate acceptance of mental health diagnosis and concerns, Identify triggers associated with mental health/substance abuse issues, and Increase skills for wellness and recovery  Therapeutic Interventions: Assess for all discharge needs, 1 to 1 time with Social worker, Explore available resources and support systems, Assess for adequacy in community support network, Educate family and significant other(s) on suicide prevention, Complete Psychosocial Assessment, Interpersonal group therapy.  Evaluation of Outcomes: Progressing   Progress in Treatment: Attending groups: Yes. Participating in groups: Yes. Taking medication as prescribed: Yes. Toleration medication: Yes. Family/Significant other contact made: Yes, Father  Patient understands diagnosis: No. Discussing patient identified problems/goals with staff: Yes. Medical problems stabilized or resolved: No. Denies suicidal/homicidal ideation: Yes. Issues/concerns per patient self-inventory: Yes. Other:    New problem(s) identified: No, Describe:  none reported   New Short Term/Long Term Goal(s): medication stabilization, elimination of SI thoughts, development of comprehensive mental wellness plan.    Patient Goals:  "I want to go home"   Discharge Plan or Barriers: Patient recently admitted. CSW will continue to follow and assess for appropriate referrals and possible discharge planning.    Reason for Continuation of Hospitalization: Delusions  Hallucinations Medication stabilization Other; describe Insomnia   Estimated Length of Stay: 3-7 Days   Last [place] Suicide Severity Risk Score: [place] Admission (Current) from 05/01/2022 in [place] [facility] Most recent reading at 05/01/2022  3:00 PM ED from  05/01/2022 in [facility] Most recent reading  at 05/01/2022  2:16 AM ED from 04/15/2022 in [facility] EMERGENCY DEPART[facility] Most recent reading at 04/15/2022  8:40 PM  C-SSRS RISK CATEGORY No Risk Error: Q3, 4, or 5 should not be populated when Q2 is No Error: Q3, 4, or 5 should not be populated when Q2 is No       Last PHQ 2/9 Scores:    02/18/2022   12:20 PM 07/19/2020    3:12 PM 05/29/2020    7:35 AM  Depression screen PHQ 2/9  Decreased Interest 0 1 0  Down, Depressed, Hopeless 0 1 1  PHQ - 2 Score 0 2 1  Altered sleeping 0 2   Tired, decreased energy 0 1   Change in appetite 0 1   Feeling bad or failure about yourself  0 1   Trouble concentrating 0 1   Moving slowly or fidgety/restless 0 0   Suicidal thoughts 0 0   PHQ-9 Score 0 8   Difficult doing work/chores Not difficult at all      Scribe for Treatment Team: Aram Beecham, Theresia Majors 05/08/2022 2:24 PM

## 2022-05-08 NOTE — Progress Notes (Signed)
The patient attended the evening N.A.meeting.  

## 2022-05-08 NOTE — Progress Notes (Signed)
   05/08/22 0600  15 Minute Checks  Location Bedroom  Visual Appearance Calm  Behavior Sleeping  Sleep (Behavioral Health Patients Only)  Calculate sleep? (Click Yes once per 24 hr at 0600 safety check) Yes  Documented sleep last 24 hours 9.25    

## 2022-05-08 NOTE — Plan of Care (Signed)
  Problem: Education: Goal: Emotional status will improve Outcome: Progressing Goal: Mental status will improve Outcome: Progressing   Problem: Activity: Goal: Interest or engagement in activities will improve Outcome: Progressing   Problem: Coping: Goal: Coping ability will improve Outcome: Progressing   

## 2022-05-08 NOTE — BHH Group Notes (Signed)
Patient did not attend the therapeutic group. 

## 2022-05-08 NOTE — Progress Notes (Signed)
Patient transferred to room 406 at 1512 today.

## 2022-05-09 DIAGNOSIS — F201 Disorganized schizophrenia: Secondary | ICD-10-CM | POA: Diagnosis not present

## 2022-05-09 NOTE — BHH Group Notes (Signed)
Adult Psychoeducational Group Note  Date:  05/09/2022 Time:  6:22 PM  Group Topic/Focus:  Managing Feelings:   The focus of this group is to identify what feelings patients have difficulty handling and develop a plan to handle them in a healthier way upon discharge.  Participation Level:  Active  Participation Quality:  Appropriate and Attentive  Affect:  Appropriate  Cognitive:  Alert and Appropriate  Insight: Appropriate, Good, and Improving  Engagement in Group:  Engaged  Modes of Intervention:  Discussion  Additional Comments:  Pt attended group and participated in group discussion.  Mikie Misner R Breydon Senters 05/09/2022, 6:22 PM

## 2022-05-09 NOTE — Group Note (Signed)
LCSW Group Therapy Note   Group Date: 05/09/2022 Start Time: 1100 End Time: 1200  Type of Therapy: Group Therapy: Boundaries   Participation Level: Did Not Attend   Description of Group: This group will address the use of boundaries in their personal lives. Patients will explore why boundaries are important, the difference between healthy and unhealthy boundaries, and negative and positive outcomes of different boundaries and will look at how boundaries can be crossed.  Patients will be encouraged to identify current boundaries in their own lives and identify what kind of boundary is being set. Facilitators will guide patients in utilizing problem-solving interventions to address and correct types of boundaries being used and to address when no boundary is being used. Understanding and applying boundaries will be explored and addressed for obtaining and maintaining a balanced life. Patients will be encouraged to explore ways to assertively make their boundaries and needs known to significant others in their lives, using other group members and facilitator for role play, support, and feedback.    Therapeutic Goals: 1. Patient will identify areas in their life where setting clear boundaries could be used to improve their life.  2. Patient will identify signs/triggers that a boundary is not being respected. 3. Patient will identify two ways to set boundaries in order to achieve balance in their lives: 4. Patient will demonstrate ability to communicate their needs and set boundaries through discussion and/or role plays   Summary of Progress/Problems: Did not attend    Darleen Crocker, [place] 05/09/2022  1:07 PM

## 2022-05-09 NOTE — Progress Notes (Addendum)
[facility] MD Progress Note  05/09/2022 11:09 AM Brendan Hogan  MRN:  [id number]  Subjective:    Brendan Hogan is a 27 y.o., male with a past psychiatric history significant for schizophrenia who was admitted to the psychiatric hospital under IVC for bizarre behaviors, agitation, appearing to respond to internal stimuli.    On evaluation today, psychiatrically patient continues to demonstrate improvement since admission. We are attempting to contact father (legal gaurdian to discuss discharge and for approval for discharge plan).  Pt appears to be at psychiatric baseline.  There is no appreciable psychosis.  Mood is stable.  Denies depression.  Reports sleeping well w/o benzo or ativan.  Denies SI, HI. Denies that to current psychiatric medications.  We discussed that we will get [facility] level on Friday then plan for dc.  [facility] for residential substance use treatment. Per social work, pt has no other options for residential substance use treatment.   Pt states that father visited in person yesterday and reports this went well.   I tried calling father (LG) at 1113am today but did not answer, left VM.   Principal Problem: Schizophrenia (HCC) Diagnosis: Principal Problem:   Schizophrenia (HCC)  Total Time spent with patient: 15 minutes  Past Psychiatric History:  Prior Psychiatric diagnoses: Schizophrenia Past Psychiatric Hospitalizations: Multiple, last hospitalization September 2023 at [facility]    History of self mutilation: None noted Past suicide attempts: Patient reports 1 suicide attempt but "long time ago" but unable to elaborate Past history of HI, violent or aggressive behavior: Per chart review history of violence and agitation towards family members   Past Psychiatric medications trials: Haldol caused EPS and neck stiffness best response was obtained from Abilify and Abilify Maintena but patient is noncompliant, risperidone and Risperdal Consta were prescribed  in the past but unable to confirm if was effective. Patient was most recently discharged on Prolixin oral, Prolixin LAI, Depakote, Ativan, and Ambien   History of ECT/TMS: Patient denies   Outpatient psychiatric Follow up: [facility]  Prior Outpatient Therapy: Unknown  Past Medical History:  Past Medical History:  Diagnosis Date   Psychosis (HCC) 05/2019   [facility]    Seasonal allergies     Past Surgical History:  Procedure Laterality Date   TONSILLECTOMY     Family History:  Family History  Problem Relation Age of Onset   Hypertension Mother    Family Psychiatric  History:  Psychiatric illness: Patient denies Suicide: Patient denies Substance Abuse: Patient denies    Social History:  Social History   Substance and Sexual Activity  Alcohol Use Not Currently   Comment: rare     Social History   Substance and Sexual Activity  Drug Use No    Social History   Socioeconomic History   Marital status: Single    Spouse name: Not on file   Number of children: Not on file   Years of education: Not on file   Highest education level: Not on file  Occupational History   Not on file  Tobacco Use   Smoking status: Every Day    Packs/day: 1.00    Types: Cigarettes   Smokeless tobacco: Former  Building services engineer Use: Never used  Substance and Sexual Activity   Alcohol use: Not Currently    Comment: rare   Drug use: No   Sexual activity: Not Currently  Other Topics Concern   Not on file  Social History Narrative  Not on file   Social Determinants of Health   Financial Resource Strain: Not on file  Food Insecurity: Unknown (05/01/2022)   Hunger Vital Sign    Worried About Running Out of Food in the Last Year: Patient refused    [place] in the Last Year: Patient refused  Transportation Needs: Unknown (05/01/2022)   [place] - Hydrologist (Medical): Patient refused    Lack of Transportation (Non-Medical): Patient  refused  Physical Activity: Not on file  Stress: Not on file  Social Connections: Not on file   Additional Social History:                         Sleep: Fair  Appetite:  Fair  Current Medications: Current Facility-Administered Medications  Medication Dose Route Frequency Provider Last Rate Last Admin   acetaminophen (TYLENOL) tablet 650 mg  650 mg Oral Q6H PRN Motley-Mangrum, Jadeka A, PMHNP       benztropine (COGENTIN) tablet 1 mg  1 mg Oral BID Motley-Mangrum, Jadeka A, PMHNP   1 mg at 05/09/22 0811   divalproex (DEPAKOTE) DR tablet 500 mg  500 mg Oral Q12H Tajha Sammarco, Ovid Curd, MD   500 mg at 05/09/22 9562   fluPHENAZine decanoate (PROLIXIN) injection 25 mg  25 mg Intramuscular Q14 Days Darrelyn Morro, Ovid Curd, MD   25 mg at 05/03/22 1207   hydrOXYzine (ATARAX) tablet 25 mg  25 mg Oral TID PRN Motley-Mangrum, Jadeka A, PMHNP   25 mg at 05/08/22 2126   magnesium hydroxide (MILK OF MAGNESIA) suspension 30 mL  30 mL Oral Daily PRN Motley-Mangrum, Jadeka A, PMHNP       mirtazapine (REMERON SOL-TAB) disintegrating tablet 15 mg  15 mg Oral QHS Cyril Woodmansee, Ovid Curd, MD   15 mg at 05/08/22 2126   nicotine polacrilex (NICORETTE) gum 2 mg  2 mg Oral PRN Motley-Mangrum, Jadeka A, PMHNP   2 mg at 05/09/22 0812   OLANZapine (ZYPREXA) injection 10 mg  10 mg Intramuscular BID PRN Irvin Bastin, Ovid Curd, MD       OLANZapine zydis (ZYPREXA) disintegrating tablet 10 mg  10 mg Oral Once PRN Motley-Mangrum, Jadeka A, PMHNP       traZODone (DESYREL) tablet 50 mg  50 mg Oral QHS PRN Motley-Mangrum, Jadeka A, PMHNP   50 mg at 05/08/22 2126    Lab Results:  No results found for this or any previous visit (from the past 48 hour(s)).    Blood Alcohol level:  Lab Results  Component Value Date   ETH <10 05/01/2022   ETH <10 13/11/6576    Metabolic Disorder Labs: Lab Results  Component Value Date   HGBA1C 5.4 05/02/2022   MPG 108 05/02/2022   MPG 91.06 01/23/2022   Lab Results  Component Value  Date   PROLACTIN 26.5 (H) 09/08/2019   Lab Results  Component Value Date   CHOL 162 05/02/2022   TRIG 71 05/02/2022   HDL 52 05/02/2022   CHOLHDL 3.1 05/02/2022   VLDL 14 05/02/2022   LDLCALC 96 05/02/2022   LDLCALC 91 01/23/2022    Physical Findings: AIMS: Facial and Oral Movements Muscles of Facial Expression: None, normal Lips and Perioral Area: None, normal Jaw: None, normal Tongue: None, normal,Extremity Movements Upper (arms, wrists, hands, fingers): None, normal Lower (legs, knees, ankles, toes): None, normal, Trunk Movements Neck, shoulders, hips: None, normal, Overall Severity Severity of abnormal movements (highest score from questions above): None, normal Incapacitation due to  abnormal movements: None, normal Patient's awareness of abnormal movements (rate only patient's report): No Awareness, Dental Status Current problems with teeth and/or dentures?: No Does patient usually wear dentures?: No  CIWA:    COWS:     Musculoskeletal: Strength & Muscle Tone: Laying in bed  Gait & Station: Laying in bed  Patient leans: Laying in bed   Psychiatric Specialty Exam:  Presentation  General Appearance:  Laying in bed. blanket over head  Eye Contact: Fair  Speech: Slow  Speech Volume: Decreased  Handedness: Right   Mood and Affect  Mood: Anxious  Affect: Full range   Thought Process  Thought Processes: Disorganized  Descriptions of Associations:linear  Orientation:Full (Time, Place and Person)  Thought Content:Rumination; Scattered; Perseveration  History of Schizophrenia/Schizoaffective disorder:Yes  Duration of Psychotic Symptoms:Greater than six months  Hallucinations:No data recorded Denies AH, VH  Ideas of Reference:none  Suicidal Thoughts:No data recorded denies  Homicidal Thoughts:No data recorded Denies    Sensorium  Memory: Immediate Fair; Recent Fair; Remote Fair  Judgment: Fair  Insight: Fair   Sales promotion account executive: Fair  Attention Span: Fair  Recall: [employer] of Knowledge: Fair  Language: Fair   Psychomotor Activity  Psychomotor Activity: No data recorded Nml. No eps.    Assets  Assets: Desire for Improvement; Financial Resources/Insurance; Housing; Social Support; Transportation   Sleep  Sleep: No data recorded Stable    Physical Exam: Physical Exam Vitals reviewed.  Constitutional:      General: He is not in acute distress.    Appearance: He is normal weight. He is not toxic-appearing.  Neurological:     Mental Status: He is alert.    Review of Systems  Constitutional:  Negative for chills and fever.  Cardiovascular:  Negative for chest pain and palpitations.  Neurological:  Negative for dizziness, tingling, tremors and headaches.  Psychiatric/Behavioral:  Positive for substance abuse. Negative for depression, hallucinations and suicidal ideas. The patient is not nervous/anxious and does not have insomnia.    Blood pressure 93/65, pulse 96, temperature 98 F (36.7 C), temperature source Oral, resp. rate 16, height 5\' 5"  (1.651 m), weight 63 kg, SpO2 98 %. Body mass index is 23.13 kg/m.    Treatment Plan Summary: Daily contact with patient to assess and evaluate symptoms and progress in treatment     ASSESSMENT:   Diagnoses / Active Problems: -Schizophrenia    PLAN: Safety and Monitoring:             -- Involuntary admission to inpatient psychiatric unit for safety, stabilization and treatment             -- Daily contact with patient to assess and evaluate symptoms and progress in treatment             -- Patient's case to be discussed in multi-disciplinary team meeting             -- Observation Level : q15 minute checks             -- Vital signs:  q12 hours             -- Precautions: suicide, elopement, and assault   2. Psychiatric Diagnoses and Treatment:               -Prolixin 25 mg IM LAI administered last on  05-03-22  -Continue depakote DR 500 mg bid for mood stabilization - previously effective with prolixin during last admission.  -Continue cogentin 1 mg bid  for eps ppx  -Previously discontinued ambien on admission  -Continue remeron 15 mg qhs  -Continue zyprexa oral and IM PRN for agitation    --  The risks/benefits/side-effects/alternatives to this medication were discussed in detail with the patient and time was given for questions. The patient consents to medication trial.                -- Metabolic profile and EKG monitoring obtained while on an atypical antipsychotic (BMI: Lipid Panel: HbgA1c: QTc:)              -- Encouraged patient to participate in unit milieu and in scheduled group therapies                          3. Medical Issues Being Addressed:              Tobacco Use Disorder             -- Nicotine gum PRN              -- Smoking cessation encouraged   4. Discharge Planning:              -- Social work and case management to assist with discharge planning and identification of hospital follow-up needs prior to discharge             -- Estimated LOS: 5-7 days             -- Discharge Concerns: Need to establish a safety plan; Medication compliance and effectiveness             -- Discharge Goals: Return home with outpatient referrals for mental health follow-up including medication management/psychotherapy  I certify that inpatient services furnished can reasonably be expected to improve the patient's condition.    Cristy Hilts, MD 05/09/2022, 11:09 AM  Total Time Spent in Direct Patient Care:  I personally spent 25 minutes on the unit in direct patient care. The direct patient care time included face-to-face time with the patient, reviewing the patient's chart, communicating with other professionals, and coordinating care. Greater than 50% of this time was spent in counseling or coordinating care with the patient regarding goals of hospitalization,  psycho-education, and discharge planning needs.   Phineas Inches, MD Psychiatrist

## 2022-05-09 NOTE — Care Plan (Signed)
I spoke with father, legal guardian, who states that the pt is at psychiatric baseline. Reports no concerns about psychiatric symptoms or treatment.  LG agreeable for discharge tomorrow. Has no other concerns or questions.

## 2022-05-09 NOTE — BHH Counselor (Signed)
[facility] LCSW Progress Note   05/09/2022    2:05 PM  Brendan Hogan   [id number]   Type of Contact and Topic: ACTT intake assessment  Abby, team lead, came to [facility] to do an intake assessment with patient.  Patient agreed to assessment but states that he is unsure if the services would benefit him.  Abby provided forms for CSW for father/guardian to sign and for social work to send back upon patient discharge. CSW agreed to get the forms signed.   CSW called patient guardian and guardian agreed to sign forms and states that he will be here to pick patient up between 10am-11am.     Signed:  Riki Altes MSW, LCSW, LCAS 05/09/2022 2:05 PM

## 2022-05-09 NOTE — Progress Notes (Signed)
   Administered PRN Hydroxyzine, Nicotine gum and Trazodone per [facility] per patient request.

## 2022-05-09 NOTE — Progress Notes (Signed)
[facility] Notes:  (Nursing/MHT/Case Management/Adjunct)  Date:  05/09/2022  Time:  2015  Type of Therapy:   wrap up group  Participation Level:  Active  Participation Quality:  Appropriate, Attentive, Sharing, and Supportive  Affect:  Appropriate  Cognitive:  Alert  Insight:  Improving  Engagement in Group:  Engaged  Modes of Intervention:  Clarification, Education, and Support  Summary of Progress/Problems: Positive thinking and positive change were discussed.   Brendan Hogan 05/09/2022, 9:27 PM

## 2022-05-09 NOTE — Group Note (Unsigned)
LCSW Group Therapy Note   Group Date: 05/09/2022 Start Time: 1100 End Time: 1200   Type of Therapy and Topic:  Group Therapy: Boundaries  Participation Level:  {BHH PARTICIPATION LEVEL:22264}  Description of Group: This group will address the use of boundaries in their personal lives. Patients will explore why boundaries are important, the difference between healthy and unhealthy boundaries, and negative and postive outcomes of different boundaries and will look at how boundaries can be crossed.  Patients will be encouraged to identify current boundaries in their own lives and identify what kind of boundary is being set. Facilitators will guide patients in utilizing problem-solving interventions to address and correct types boundaries being used and to address when no boundary is being used. Understanding and applying boundaries will be explored and addressed for obtaining and maintaining a balanced life. Patients will be encouraged to explore ways to assertively make their boundaries and needs known to significant others in their lives, using other group members and facilitator for role play, support, and feedback.  Therapeutic Goals:  1.  Patient will identify areas in their life where setting clear boundaries could be  used to improve their life.  2.  Patient will identify signs/triggers that a boundary is not being respected. 3.  Patient will identify two ways to set boundaries in order to achieve balance in  their lives: 4.  Patient will demonstrate ability to communicate their needs and set boundaries  through discussion and/or role plays  Summary of Patient Progress:  *** was ***present/active throughout the session and proved open to feedback from CSW and peers. Patient demonstrated *** insight into the subject matter, was respectful of peers, and was present throughout the entire session.  Therapeutic Modalities:   Cognitive Behavioral Therapy Solution-Focused Therapy  Trenten Watchman S  Deriana Vanderhoef, LCSWA 05/09/2022  1:22 PM    

## 2022-05-09 NOTE — Plan of Care (Signed)
  Problem: Education: Goal: Mental status will improve Outcome: Progressing   Problem: Activity: Goal: Sleeping patterns will improve Outcome: Progressing   Problem: Health Behavior/Discharge Planning: Goal: Compliance with treatment plan for underlying cause of condition will improve Outcome: Progressing   Problem: Safety: Goal: Periods of time without injury will increase Outcome: Progressing   

## 2022-05-09 NOTE — Progress Notes (Signed)
Patient denies SI,HI, and A/V/H with no plan/intent. Patient socializing, med compliant, and attending groups. Patient stated he is looking forward to discharge. Patient remains cooperative on unit and denies any pain or further concerns at this time.    05/09/22 0830  Psych Admission Type (Psych Patients Only)  Admission Status Voluntary  Psychosocial Assessment  Patient Complaints None  Eye Contact Fair  Facial Expression Animated  Affect Appropriate to circumstance  Speech Logical/coherent  Interaction Assertive  Motor Activity Other (Comment) (WDL)  Appearance/Hygiene Unremarkable  Behavior Characteristics Cooperative;Appropriate to situation  Mood Pleasant  Thought Process  Coherency Circumstantial  Content WDL  Delusions None reported or observed  Perception WDL  Hallucination None reported or observed  Judgment Limited  Confusion None  Danger to Self  Current suicidal ideation? Denies  Danger to Others  Danger to Others None reported or observed

## 2022-05-10 DIAGNOSIS — F201 Disorganized schizophrenia: Secondary | ICD-10-CM | POA: Diagnosis not present

## 2022-05-10 LAB — VALPROIC ACID LEVEL: Valproic Acid Lvl: 82 ug/mL (ref 50.0–100.0)

## 2022-05-10 MED ORDER — BENZTROPINE MESYLATE 1 MG PO TABS: 1.0000 mg | ORAL_TABLET | Freq: Two times a day (BID) | ORAL | 0 refills | Status: DC

## 2022-05-10 MED ORDER — MIRTAZAPINE 15 MG PO TBDP: 15.0000 mg | ORAL_TABLET | Freq: Every day | ORAL | 0 refills | Status: DC

## 2022-05-10 MED ORDER — NICOTINE POLACRILEX 2 MG MT GUM: 2.0000 mg | CHEWING_GUM | OROMUCOSAL | 3 refills | Status: DC | PRN

## 2022-05-10 MED ORDER — HYDROXYZINE HCL 25 MG PO TABS: 25.0000 mg | ORAL_TABLET | Freq: Three times a day (TID) | ORAL | 0 refills | Status: DC | PRN

## 2022-05-10 MED ORDER — DIVALPROEX SODIUM 500 MG PO DR TAB: 500.0000 mg | DELAYED_RELEASE_TABLET | Freq: Two times a day (BID) | ORAL | 0 refills | Status: DC

## 2022-05-10 MED ORDER — FLUPHENAZINE DECANOATE 25 MG/ML IJ SOLN: 25.0000 mg | INTRAMUSCULAR | 0 refills | Status: DC

## 2022-05-10 NOTE — [facility] Suicide Risk Assessment (Signed)
[facility] Discharge Suicide Risk Assessment   Principal Problem: Schizophrenia [facility]) Discharge Diagnoses: Principal Problem:   Schizophrenia (HCC)   Total Time spent with patient: 15 minutes  Brendan Hogan is a 27 y.o., male with a past psychiatric history significant for schizophrenia who was admitted to the psychiatric hospital under IVC for bizarre behaviors, agitation, appearing to respond to internal stimuli.      During the patient's hospitalization, patient had extensive initial psychiatric evaluation, and follow-up psychiatric evaluations every day.  Psychiatric diagnoses provided upon initial assessment:  Schizophrenia   Patient's psychiatric medications were adjusted on admission:               -Continue Prolixin LAI - need to confirm last adminsitration, the give 25 mg q2weeks from last dose.  -Restart depakote 750 mg bid for mood stabilization - previously effective fro stabilization plus prolix during last admission. Non adherent at home  -Restart cogentin 1 mg bid for eps ppx  -Stop ambien -Restart remeron   During the hospitalization, other adjustments were made to the patient's psychiatric medication regimen:  -Depakote dose was adjusted to DR 500 mg bid VA level on 05-10-22 was: 82 -Prolixin Dec 25 mg LAI IM was given on 05-03-22. Next dose is due on 05-17-2022. -cogentin and remeron were continued  Patient's care was discussed during the interdisciplinary team meeting every day during the hospitalization.  The patient denied having side effects to prescribed psychiatric medication. Aims score zero on my exam. No eps on my exam.  Gradually, patient started adjusting to milieu. The patient was evaluated each day by a clinical provider to ascertain response to treatment. Improvement was noted by the patient's report of decreasing symptoms, improved sleep and appetite, affect, medication tolerance, behavior, and participation in unit programming.  Patient was asked each  day to complete a self inventory noting mood, mental status, pain, new symptoms, anxiety and concerns.    Symptoms were reported as significantly decreased or resolved completely by discharge.   On day of discharge, the patient reports that their mood is stable. The patient denied having suicidal thoughts for more than 48 hours prior to discharge.  Patient denies having homicidal thoughts.  Patient denies having auditory hallucinations.  Patient denies any visual hallucinations or other symptoms of psychosis. The patient was motivated to continue taking medication with a goal of continued improvement in mental health.   The patient reports their target psychiatric symptoms of psychosis responded well to the psychiatric medications, and the patient reports overall benefit other psychiatric hospitalization. Supportive psychotherapy was provided to the patient. The patient also participated in regular group therapy while hospitalized. Coping skills, problem solving as well as relaxation therapies were also part of the unit programming.  Labs were reviewed with the patient, and abnormal results were discussed with the patient.  The patient is able to verbalize their individual safety plan to this provider.  # It is recommended to the patient to continue psychiatric medications as prescribed, after discharge from the hospital.    # It is recommended to the patient to follow up with your outpatient psychiatric provider and PCP.  # It was discussed with the patient, the impact of alcohol, drugs, tobacco have been there overall psychiatric and medical wellbeing, and total abstinence from substance use was recommended the patient.ed.  # Prescriptions provided or sent directly to preferred pharmacy at discharge. Patient agreeable to plan. Given opportunity to ask questions. Appears to feel comfortable with discharge.    # In  the event of worsening symptoms, the patient is instructed to call the crisis  hotline, 911 and or go to the nearest ED for appropriate evaluation and treatment of symptoms. To follow-up with primary care provider for other medical issues, concerns and or health care needs  # Patient was discharged to home with father, with a plan to follow up as noted below.     Psychiatric Specialty Exam  Presentation  General Appearance:  Casual  Eye Contact: Good  Speech: Normal Rate  Speech Volume: Normal  Handedness: Right   Mood and Affect  Mood: Euthymic  Duration of Depression Symptoms: Greater than two weeks  Affect: Appropriate; Congruent; Full Range   Thought Process  Thought Processes: Linear  Descriptions of Associations:Intact  Orientation:Full (Time, Place and Person)  Thought Content:Logical  History of Schizophrenia/Schizoaffective disorder:Yes  Duration of Psychotic Symptoms:Greater than six months  Hallucinations:Hallucinations: None  Ideas of Reference:None  Suicidal Thoughts:Suicidal Thoughts: No  Homicidal Thoughts:Homicidal Thoughts: No   Sensorium  Memory: Immediate Good; Recent Good; Remote Good  Judgment: Fair  Insight: Fair   Materials engineer: Fair  Attention Span: Fair  Recall: [employer] of Knowledge: Fair  Language: Fair   Psychomotor Activity  Psychomotor Activity: Psychomotor Activity: Normal   Assets  Assets: Desire for Improvement; Financial Resources/Insurance; Housing; Social Support; Transportation   Sleep  Sleep: Sleep: Fair   Physical Exam: Physical Exam See discharge summary  ROS See discharge summary   Blood pressure 120/78, pulse (!) 108, temperature 98 F (36.7 C), temperature source Oral, resp. rate 16, height 5\' 5"  (1.651 m), weight 63 kg, SpO2 98 %. Body mass index is 23.13 kg/m.  Mental Status Per Nursing Assessment::   On Admission:  NA  Demographic Factors:  Male  Loss Factors: Decrease in vocational status  Historical  Factors: Impulsivity  Risk Reduction Factors:   Positive social support, Positive therapeutic relationship, and Positive coping skills or problem solving skills  Continued Clinical Symptoms:  Schizophrenia   Cognitive Features That Contribute To Risk:  Closed-mindedness    Suicide Risk:  Minimal: No identifiable suicidal ideation.  Patients presenting with no risk factors but with morbid ruminations; may be classified as minimal risk based on the severity of the depressive symptoms    Follow-up [place]. Go to.   Specialty: Behavioral Health Why: Please go to this provider for an assessment, to obtain therapy and medication management services on Monday through Friday, arrive no later than 7:20 am as services are first come, first served. Contact information: [place] [phone]        Services, [facility] Follow up.   Why: Referral made Contact information: [address] [place] [address] [phone]         [facility], [place]. Schedule an appointment as soon as possible for a visit.   Why: We have called to try to get an appointment.  They requested to speak with guardian to make that appointment.  Please call to follow up and make an appointment. Contact information: [place] [address] [place]. Call.   Why: A referral has been placed on your behalf. Please call to get connected with services and get set up with an intake appointment to get connected to services. Contact information: [place]  [address] [phone]  Plan Of Care/Follow-up recommendations:   Activity: as tolerated  Diet: heart healthy  Other: -Follow-up with your outpatient psychiatric provider -instructions on appointment date, time, and address (location) are  provided to you in discharge paperwork.  -Take your psychiatric medications as prescribed at discharge - instructions are provided to you in the discharge paperwork  -Follow-up with outpatient primary care doctor and other specialists -for management of preventative medicine and chronic medical disease.   -Testing: Follow-up with outpatient provider for lab results:  VA level 05-10-22: 82   -Recommend abstinence from alcohol, tobacco, and other illicit drug use at discharge.   -If your psychiatric symptoms recur, worsen, or if you have side effects to your psychiatric medications, call your outpatient psychiatric provider, 911, [phone] or go to the nearest emergency department.  -If suicidal thoughts recur, call your outpatient psychiatric provider, 911, [phone] or go to the nearest emergency department.    Christoper Allegra, MD 05/10/2022, 9:34 AM

## 2022-05-10 NOTE — Discharge Summary (Signed)
Physician Discharge Summary Note  Patient:  Brendan Hogan is an 27 y.o., male MRN:  [id number] DOB:  [birth year] Patient phone:  [phone] (home)  Patient address:   [address] [place] [address],  Total Time spent with patient: 15 minutes  Date of Admission:  05/01/2022 Date of Discharge: 05-10-2022  Reason for Admission:  Brendan Hogan is a 27 y.o., male with a past psychiatric history significant for schizophrenia who was admitted to the psychiatric hospital under IVC for bizarre behaviors, agitation, appearing to respond to internal stimuli.      Principal Problem: Schizophrenia [facility]) Discharge Diagnoses: Principal Problem:   Schizophrenia [facility])   Past Psychiatric History:  Prior Psychiatric diagnoses: Schizophrenia Past Psychiatric Hospitalizations: Multiple, last hospitalization September 2023 at [facility]    History of self mutilation: None noted Past suicide attempts: Patient reports 1 suicide attempt but "long time ago" but unable to elaborate Past history of HI, violent or aggressive behavior: Per chart review history of violence and agitation towards family members   Past Psychiatric medications trials: Haldol caused EPS and neck stiffness best response was obtained from Abilify and Abilify Maintena but patient is noncompliant, risperidone and Risperdal Consta were prescribed in the past but unable to confirm if was effective. Patient was most recently discharged on Prolixin oral, Prolixin LAI, Depakote, Ativan, and Ambien   History of ECT/TMS: Patient denies   Outpatient psychiatric Follow up: [facility]  Prior Outpatient Therapy: Unknown  Past Medical History:  Past Medical History:  Diagnosis Date   Psychosis (HCC) 05/2019   [facility]    Seasonal allergies     Past Surgical History:  Procedure Laterality Date   TONSILLECTOMY     Family History:  Family History  Problem Relation Age of Onset   Hypertension Mother    Family  Psychiatric  History:  Psychiatric illness: Patient denies Suicide: Patient denies Substance Abuse: Patient denies     Social History:  Social History   Substance and Sexual Activity  Alcohol Use Not Currently   Comment: rare     Social History   Substance and Sexual Activity  Drug Use No    Social History   Socioeconomic History   Marital status: Single    Spouse name: Not on file   Number of children: Not on file   Years of education: Not on file   Highest education level: Not on file  Occupational History   Not on file  Tobacco Use   Smoking status: Every Day    Packs/day: 1.00    Types: Cigarettes   Smokeless tobacco: Former  Building services engineer Use: Never used  Substance and Sexual Activity   Alcohol use: Not Currently    Comment: rare   Drug use: No   Sexual activity: Not Currently  Other Topics Concern   Not on file  Social History Narrative   Not on file   Social Determinants of Health   Financial Resource Strain: Not on file  Food Insecurity: Unknown (05/01/2022)   Hunger Vital Sign    Worried About Running Out of Food in the Last Year: Patient refused    Ran Out of Food in the Last Year: Patient refused  Transportation Needs: Unknown (05/01/2022)   PRAPARE - Administrator, Civil Service (Medical): Patient refused    Lack of Transportation (Non-Medical): Patient refused  Physical Activity: Not on file  Stress: Not on file  Social Connections:  Not on file    Hospital Course:     During the patient's hospitalization, patient had extensive initial psychiatric evaluation, and follow-up psychiatric evaluations every day.   Psychiatric diagnoses provided upon initial assessment:  Schizophrenia    Patient's psychiatric medications were adjusted on admission:               -Continue Prolixin LAI - need to confirm last adminsitration, the give 25 mg q2weeks from last dose.  -Restart depakote 750 mg bid for mood stabilization -  previously effective fro stabilization plus prolix during last admission. Non adherent at home  -Restart cogentin 1 mg bid for eps ppx  -Stop ambien -Restart remeron    During the hospitalization, other adjustments were made to the patient's psychiatric medication regimen:  -Depakote dose was adjusted to DR 500 mg bid VA level on 05-10-22 was: 82 -Prolixin Dec 25 mg LAI IM was given on 05-03-22. Next dose is due on 05-17-2022. -cogentin and remeron were continued   Patient's care was discussed during the interdisciplinary team meeting every day during the hospitalization.   The patient denied having side effects to prescribed psychiatric medication. Aims score zero on my exam. No eps on my exam.   Gradually, patient started adjusting to milieu. The patient was evaluated each day by a clinical provider to ascertain response to treatment. Improvement was noted by the patient's report of decreasing symptoms, improved sleep and appetite, affect, medication tolerance, behavior, and participation in unit programming.  Patient was asked each day to complete a self inventory noting mood, mental status, pain, new symptoms, anxiety and concerns.     Symptoms were reported as significantly decreased or resolved completely by discharge.    On day of discharge, the patient reports that their mood is stable. The patient denied having suicidal thoughts for more than 48 hours prior to discharge.  Patient denies having homicidal thoughts.  Patient denies having auditory hallucinations.  Patient denies any visual hallucinations or other symptoms of psychosis. The patient was motivated to continue taking medication with a goal of continued improvement in mental health.    The patient reports their target psychiatric symptoms of psychosis responded well to the psychiatric medications, and the patient reports overall benefit other psychiatric hospitalization. Supportive psychotherapy was provided to the patient. The  patient also participated in regular group therapy while hospitalized. Coping skills, problem solving as well as relaxation therapies were also part of the unit programming.   Labs were reviewed with the patient, and abnormal results were discussed with the patient.   The patient is able to verbalize their individual safety plan to this provider.   # It is recommended to the patient to continue psychiatric medications as prescribed, after discharge from the hospital.     # It is recommended to the patient to follow up with your outpatient psychiatric provider and PCP.   # It was discussed with the patient, the impact of alcohol, drugs, tobacco have been there overall psychiatric and medical wellbeing, and total abstinence from substance use was recommended the patient.ed.   # Prescriptions provided or sent directly to preferred pharmacy at discharge. Patient agreeable to plan. Given opportunity to ask questions. Appears to feel comfortable with discharge.    # In the event of worsening symptoms, the patient is instructed to call the crisis hotline, 911 and or go to the nearest ED for appropriate evaluation and treatment of symptoms. To follow-up with primary care provider for other medical issues, concerns and  or health care needs   # Patient was discharged to home with father, with a plan to follow up as noted below.   Physical Findings: AIMS: Facial and Oral Movements Muscles of Facial Expression: None, normal Lips and Perioral Area: None, normal Jaw: None, normal Tongue: None, normal,Extremity Movements Upper (arms, wrists, hands, fingers): None, normal Lower (legs, knees, ankles, toes): None, normal, Trunk Movements Neck, shoulders, hips: None, normal, Overall Severity Severity of abnormal movements (highest score from questions above): None, normal Incapacitation due to abnormal movements: None, normal Patient's awareness of abnormal movements (rate only patient's report): No  Awareness, Dental Status Current problems with teeth and/or dentures?: No Does patient usually wear dentures?: No  CIWA:    COWS:     Musculoskeletal: Strength & Muscle Tone: within normal limits Gait & Station: normal Patient leans: N/A   Psychiatric Specialty Exam:  Presentation  General Appearance:  Casual  Eye Contact: Good  Speech: Normal Rate  Speech Volume: Normal  Handedness: Right   Mood and Affect  Mood: Euthymic  Affect: Appropriate; Congruent; Full Range   Thought Process  Thought Processes: Linear  Descriptions of Associations:Intact  Orientation:Full (Time, Place and Person)  Thought Content:Logical  History of Schizophrenia/Schizoaffective disorder:Yes  Duration of Psychotic Symptoms:Greater than six months  Hallucinations:No data recorded  Ideas of Reference:None  Suicidal Thoughts:No data recorded  Homicidal Thoughts:No data recorded   Sensorium  Memory: Immediate Good; Recent Good; Remote Good  Judgment: Fair  Insight: Fair   Chartered certified accountant: Fair  Attention Span: Fair  Recall: [employer] of Knowledge: Fair  Language: Fair   Psychomotor Activity  Psychomotor Activity: No data recorded   Assets  Assets: Desire for Improvement; Financial Resources/Insurance; Housing; Social Support; Transportation   Sleep  Sleep: No data recorded    Physical Exam: Physical Exam Vitals reviewed.  Constitutional:      General: He is not in acute distress.    Appearance: He is normal weight. He is not toxic-appearing.  Neurological:     Mental Status: He is alert.     Motor: No weakness.     Gait: Gait normal.  Psychiatric:        Mood and Affect: Mood normal.        Behavior: Behavior normal.        Thought Content: Thought content normal.        Judgment: Judgment normal.      Review of Systems  Constitutional:  Negative for chills and fever.  Cardiovascular:  Negative for  chest pain and palpitations.  Neurological:  Negative for dizziness, tingling, tremors and headaches.  Psychiatric/Behavioral:  Positive for substance abuse. Negative for depression, hallucinations, memory loss and suicidal ideas. The patient is not nervous/anxious and does not have insomnia.   All other systems reviewed and are negative.  Blood pressure 120/78, pulse (!) 108, temperature 98 F (36.7 C), temperature source Oral, resp. rate 16, height 5\' 5"  (1.651 m), weight 63 kg, SpO2 98 %. Body mass index is 23.13 kg/m.   Social History   Tobacco Use  Smoking Status Every Day   Packs/day: 1.00   Types: Cigarettes  Smokeless Tobacco Former   Tobacco Cessation:  A prescription for an FDA-approved tobacco cessation medication provided at discharge   Blood Alcohol level:  Lab Results  Component Value Date   [facility] <10 05/01/2022   ETH <10 03/11/2022    Metabolic Disorder Labs:  Lab Results  Component Value Date   HGBA1C 5.4  05/02/2022   MPG 108 05/02/2022   MPG 91.06 01/23/2022   Lab Results  Component Value Date   PROLACTIN 26.5 (H) 09/08/2019   Lab Results  Component Value Date   CHOL 162 05/02/2022   TRIG 71 05/02/2022   HDL 52 05/02/2022   CHOLHDL 3.1 05/02/2022   VLDL 14 05/02/2022   LDLCALC 96 05/02/2022   LDLCALC 91 01/23/2022    See Psychiatric Specialty Exam and Suicide Risk Assessment completed by Attending Physician prior to discharge.  Discharge destination:  Home  Is patient on multiple antipsychotic therapies at discharge:  No   Has Patient had three or more failed trials of antipsychotic monotherapy by history:  No  Recommended Plan for Multiple Antipsychotic Therapies: NA  Discharge Instructions     Diet - low sodium heart healthy   Complete by: As directed    Increase activity slowly   Complete by: As directed       Allergies as of 05/10/2022       Reactions   Haldol [haloperidol] Other (See Comments)   Muscle spasms- involuntary  muscle contractions   Geodon [ziprasidone] Other (See Comments)   Patient reports that this medicine made him feel bad. He has difficult describing symptoms. He requests not to take anymore.         Medication List     STOP taking these medications    cloNIDine 0.1 MG tablet Commonly known as: CATAPRES   mirtazapine 15 MG tablet Commonly known as: REMERON Replaced by: mirtazapine 15 MG disintegrating tablet   zolpidem 10 MG tablet Commonly known as: AMBIEN       TAKE these medications      Indication  benztropine 1 MG tablet Commonly known as: COGENTIN Take 1 tablet (1 mg total) by mouth 2 (two) times daily.  Indication: Extrapyramidal Reaction caused by Medications   divalproex 500 MG DR tablet Commonly known as: DEPAKOTE Take 1 tablet (500 mg total) by mouth every 12 (twelve) hours. What changed:  medication strength how much to take Another medication with the same name was removed. Continue taking this medication, and follow the directions you see here.  Indication: Schizophrenia   fluPHENAZine decanoate 25 MG/ML injection Commonly known as: PROLIXIN Inject 1 mL (25 mg total) into the muscle every 14 (fourteen) days for 1 dose. Next dose is due on 05-17-2022 Start taking on: May 17, 2022 What changed:  when to take this additional instructions  Indication: Schizophrenia   hydrOXYzine 25 MG tablet Commonly known as: ATARAX Take 1 tablet (25 mg total) by mouth 3 (three) times daily as needed for anxiety.  Indication: Feeling Anxious   mirtazapine 15 MG disintegrating tablet Commonly known as: REMERON SOL-TAB Take 1 tablet (15 mg total) by mouth at bedtime. Replaces: mirtazapine 15 MG tablet  Indication: insomnia   nicotine polacrilex 2 MG gum Commonly known as: NICORETTE Take 1 each (2 mg total) by mouth as needed for smoking cessation.  Indication: Nicotine Addiction        Follow-up Information     [place]. Go to.   Specialty: Behavioral Health Why: Please go to this provider for an assessment, to obtain therapy and medication management services on Monday through Friday, arrive no later than 7:20 am as services are first come, first served. Contact information: [place] [facility] Schedule an appointment as soon as possible  for a visit.   Why: We have called to try to get an appointment.  They requested to speak with guardian to make that appointment.  Please call to follow up and make an appointment. Contact information: [address] [place] [address] [phone]         [facility] Call.   Why: A referral has been placed on your behalf and you have completed an intake assessment. They plan to come see you at 1pm on Monday.  Please be at your home.  If this does not work with your schedule, please call and let them know so that they can come at a more convenient time. Contact information: [address] [place] [address] [phone]                 Follow-up recommendations:     Activity: as tolerated   Diet: heart healthy   Other: -Follow-up with your outpatient psychiatric provider -instructions on appointment date, time, and address (location) are provided to you in discharge paperwork.   -Take your psychiatric medications as prescribed at discharge - instructions are provided to you in the discharge paperwork   -Follow-up with outpatient primary care doctor and other specialists -for management of preventative medicine and chronic medical disease.    -Testing: Follow-up with outpatient provider for lab results:  VA level 05-10-22: 82     -Recommend abstinence from alcohol, tobacco, and other illicit drug use at discharge.    -If your psychiatric symptoms recur, worsen, or if you have side effects to your psychiatric medications,  call your outpatient psychiatric provider, 911, 988 or go to the nearest emergency department.   -If suicidal thoughts recur, call your outpatient psychiatric provider, 911, 988 or go to the nearest emergency department.     Signed: Cristy Hilts, MD 05/13/2022, 11:03 AM   Total Time Spent in Direct Patient Care:  I personally spent 45 minutes on the unit in direct patient care. The direct patient care time included face-to-face time with the patient, reviewing the patient's chart, communicating with other professionals, and coordinating care. Greater than 50% of this time was spent in counseling or coordinating care with the patient regarding goals of hospitalization, psycho-education, and discharge planning needs.   Phineas Inches, MD Psychiatrist

## 2022-05-10 NOTE — Plan of Care (Signed)
  Problem: Education: Goal: Knowledge of [facility] Education information/materials will improve 05/10/2022 1004 by Dorris Carnes, RN Outcome: Completed/Met 05/10/2022 0839 by Dorris Carnes, RN Outcome: Progressing Goal: Emotional status will improve 05/10/2022 1004 by Dorris Carnes, RN Outcome: Completed/Met 05/10/2022 0839 by Dorris Carnes, RN Outcome: Progressing Goal: Mental status will improve 05/10/2022 1004 by Dorris Carnes, RN Outcome: Completed/Met 05/10/2022 0839 by Dorris Carnes, RN Outcome: Progressing Goal: Verbalization of understanding the information provided will improve 05/10/2022 1004 by Dorris Carnes, RN Outcome: Completed/Met 05/10/2022 0839 by Dorris Carnes, RN Outcome: Progressing   Problem: Activity: Goal: Interest or engagement in activities will improve 05/10/2022 1004 by Dorris Carnes, RN Outcome: Completed/Met 05/10/2022 0839 by Dorris Carnes, RN Outcome: Progressing Goal: Sleeping patterns will improve 05/10/2022 1004 by Dorris Carnes, RN Outcome: Completed/Met 05/10/2022 0839 by Dorris Carnes, RN Outcome: Progressing

## 2022-05-10 NOTE — Plan of Care (Signed)
  Problem: Coping: Goal: Ability to verbalize frustrations and anger appropriately will improve Outcome: Progressing Goal: Ability to demonstrate self-control will improve Outcome: Progressing   Problem: Safety: Goal: Periods of time without injury will increase Outcome: Progressing   

## 2022-05-10 NOTE — Progress Notes (Signed)
Brendan Hogan  D/C'd Home per MD order.  Discussed with the patient and all questions fully answered.  An After Visit Summary was printed and given to the patient. Patient received prescription information to pick up his medication at the pharmacy. .  D/c education completed with patient including follow up instructions, medication list, d/c activities limitations if indicated, with other d/c instructions as indicated by MD - patient able to verbalize understanding, all questions fully answered.   Patient instructed to return to ED, call 911, or call MD for any changes in condition.   Patient escorted to the main entrance, and D/C home via private auto.  Dorris Carnes 05/10/2022 11:58 AM

## 2022-05-10 NOTE — Progress Notes (Signed)
  [facility] Adult Case Management Discharge Plan :  Will you be returning to the same living situation after discharge:  Yes,  home At discharge, do you have transportation home?: Yes,  father is picking patient up Do you have the ability to pay for your medications: Yes,  family support, connected with [facility]  Release of information consent forms completed and in the chart;  Patient's signature needed at discharge.  Patient to Follow up at:  [place]. Go to.   Specialty: Behavioral Health Why: Please go to this provider for an assessment, to obtain therapy and medication management services on Monday through Friday, arrive no later than 7:20 am as services are first come, first served. Contact information: [place] [phone]        [facility] Follow up.   Why: Referral made Contact information: [address] [place] [address] [phone]         [facility] [place]. Schedule an appointment as soon as possible for a visit.   Why: We have called to try to get an appointment.  They requested to speak with guardian to make that appointment.  Please call to follow up and make an appointment. Contact information: [place] [address] [place]. Call.   Why: A referral has been placed on your behalf. Please call to get connected with services and get set up with an intake appointment to get connected to services. Contact information: [place] [address] [phone]                 Next level of care provider has access to [facility] and Suicide Prevention discussed: Yes,  Father, guardian, Alene Mires     Has patient been referred to the Quitline?: Patient refused referral  Patient has been referred for addiction treatment: Yes,  [facility] substance use and peer support services.   [place], LCSW 05/10/2022, 9:58 AM

## 2022-05-10 NOTE — Discharge Instructions (Addendum)
-  Follow-up with your outpatient psychiatric provider -instructions on appointment date, time, and address (location) are provided to you in discharge paperwork.  -Take your psychiatric medications as prescribed at discharge - instructions are provided to you in the discharge paperwork Your depakote level on 05-10-22 is: 93 Your next dose of prolixin 25 mg LAI IM is due on 05-17-2022  -Follow-up with outpatient primary care doctor and other specialists -for management of preventative medicine and any chronic medical disease.  -Recommend abstinence from alcohol, tobacco, and other illicit drug use at discharge.   -If your psychiatric symptoms recur, worsen, or if you have side effects to your psychiatric medications, call your outpatient psychiatric provider, 911, 988 or go to the nearest emergency department.  -If suicidal thoughts occur, call your outpatient psychiatric provider, 911, 988 or go to the nearest emergency department.  Naloxone (Narcan) can help reverse an overdose when given to the victim quickly.  [facility] offers free naloxone kits and instructions/training on its use.  Add naloxone to your first aid kit and you can help save a life.   Pick up your free kit at the following locations:   [place]:  [place], [place] [address] [phone]) [facility] [place] 8[address]643 [phone]) [facility] [place]  [place]: [place] [address] [place] [address] [phone]) [facility] [place] [address] [phone])

## 2022-05-10 NOTE — Progress Notes (Signed)
   05/10/22 0100  Psych Admission Type (Psych Patients Only)  Admission Status Voluntary  Psychosocial Assessment  Patient Complaints None  Eye Contact Fair  Facial Expression Animated  Affect Appropriate to circumstance  Speech Logical/coherent  Interaction Assertive  Motor Activity Slow  Appearance/Hygiene Unremarkable  Behavior Characteristics Cooperative;Appropriate to situation;Calm  Mood Pleasant  Thought Process  Coherency WDL  Content WDL  Delusions None reported or observed  Perception WDL  Hallucination None reported or observed  Judgment Poor  Confusion None  Danger to Self  Current suicidal ideation? Denies  Danger to Others  Danger to Others None reported or observed

## 2022-05-10 NOTE — [facility] Group Notes (Signed)
Adult Psychoeducational Group Note  Date:  05/10/2022 Time:  11:07 AM  Group Topic/Focus:  Goals Group:   The focus of this group is to help patients establish daily goals to achieve during treatment and discuss how the patient can incorporate goal setting into their daily lives to aide in recovery.  Participation Level:  Active  Participation Quality:  Appropriate  Affect:  Appropriate  Cognitive:  Appropriate  Insight: Appropriate  Engagement in Group:  Engaged  Modes of Intervention:  Discussion  Additional Comments:  Patient goal of the day is to leave the hospital.   Brendan Hogan 05/10/2022, 11:07 AM

## 2022-05-21 ENCOUNTER — Telehealth (HOSPITAL_COMMUNITY): Payer: PRIVATE HEALTH INSURANCE | Admitting: Psychiatry

## 2022-05-21 ENCOUNTER — Encounter (HOSPITAL_COMMUNITY): Payer: Self-pay | Admitting: Psychiatry

## 2022-05-21 DIAGNOSIS — F5101 Primary insomnia: Secondary | ICD-10-CM

## 2022-05-21 DIAGNOSIS — F209 Schizophrenia, unspecified: Secondary | ICD-10-CM

## 2022-05-21 DIAGNOSIS — F17200 Nicotine dependence, unspecified, uncomplicated: Secondary | ICD-10-CM

## 2022-05-21 DIAGNOSIS — F411 Generalized anxiety disorder: Secondary | ICD-10-CM | POA: Diagnosis not present

## 2022-05-21 DIAGNOSIS — F17210 Nicotine dependence, cigarettes, uncomplicated: Secondary | ICD-10-CM | POA: Diagnosis not present

## 2022-05-21 MED ORDER — DIVALPROEX SODIUM 500 MG PO DR TAB: 500.0000 mg | DELAYED_RELEASE_TABLET | Freq: Two times a day (BID) | ORAL | 3 refills | Status: DC

## 2022-05-21 MED ORDER — NICOTINE POLACRILEX 2 MG MT GUM: 2.0000 mg | CHEWING_GUM | OROMUCOSAL | 3 refills | Status: DC | PRN

## 2022-05-21 MED ORDER — MIRTAZAPINE 30 MG PO TABS: 30.0000 mg | ORAL_TABLET | Freq: Every day | ORAL | 3 refills | Status: DC

## 2022-05-21 MED ORDER — FLUPHENAZINE HCL 10 MG PO TABS: 20.0000 mg | ORAL_TABLET | Freq: Every day | ORAL | 3 refills | Status: DC

## 2022-05-21 MED ORDER — HYDROXYZINE HCL 25 MG PO TABS: 25.0000 mg | ORAL_TABLET | Freq: Three times a day (TID) | ORAL | 3 refills | Status: DC | PRN

## 2022-05-21 MED ORDER — BENZTROPINE MESYLATE 1 MG PO TABS: 1.0000 mg | ORAL_TABLET | Freq: Two times a day (BID) | ORAL | 3 refills | Status: DC

## 2022-05-21 NOTE — Progress Notes (Signed)
BH MD/PA/NP OP Progress Note Virtual Visit via Video Note  I connected with Brendan Hogan on 05/21/22 at 11:00 AM EST by a video enabled telemedicine application and verified that I am speaking with the correct person using two identifiers.  Location: Patient: Home Provider: Clinic   I discussed the limitations of evaluation and management by telemedicine and the availability of in person appointments. The patient expressed understanding and agreed to proceed.  I provided 30 minutes of non-face-to-face time during this encounter.   05/21/2022 11:04 AM Brendan Hogan  MRN:  [id number]  Chief Complaint: "I can't sleep can I have Ambien"  HPI: 27 year old male seen today for follow up psychiatric evaluation.  He was recently admitted to [facility] on 05/01/2022 to 05/10/2022 for bizarre behaviors, agitation, appearing to respond to internal stimuli.  He has a psychiatric history of schizophrenia, SI, and HI.  Currently he is managed on Cogentin 1 mg twice daily,  Depakote 500 milligrams twice daily, Prolixin 25 mg every 14 days (next injection due 05/17/2022, Nicorette gum 2 mg, Mirtazapine 15 mg nightly, and hydroxyzine 25 mg three times daily as needed.  Patient reports that his medications are somewhat effective in managing his psychiatric conditions.  He does have a history of medication noncompliance.   Today patient was well-groomed, pleasant, and somewhat engaged in conversation.  He informed Clinical research associate that he cannot sleep noting that he sleeps 2 hours nightly and requested to be restarted on Ambien.  Ambien discontinued due to misuse.  Patient's guardian/father notes that patient takes 2-3 times the recommended doses.  Patient informed that Ambien would not be restarted and informed him that mirtazapine could be increased.  At this time patient became agitated and disconnected from virtual meeting.  Provider called patient who informed writer that he would take his Prolixin injection if Ambien was  prescribed.  Provider informed patient that bargaining would not work and recommended that he come in to have his Prolixin injection.  He informed Clinical research associate that he does not wish to continue his Prolixin injection.  Provider informed patient that oral Prolixin could be prescribed.  He endorsed understanding but continued to be irritable.  Provider unable to conduct a GAD-7 or PHQ-9.  Patient did Archivist that he feels mentally stable and is not anxious or depressed.  Today he denies SI/HI/VAH, mania, or paranoia.    Patient was seen with his father who notes that patient has been misusing Ambien when he gets it.  Patient's father attempted to bring patient into the clinic however patient refused.  Patient would not allow provider to speak with his father at this point.  Today mirtazapine increased from 15 mg to 30 mg to help with sleep.  Prolixin 25 mg every 2 weeks changed to Prolixin 20 mg daily.  He will continue other medications as prescribed.  No other concerns at this time.     Visit Diagnosis:    ICD-10-CM   1. Primary insomnia  F51.01 benztropine (COGENTIN) 1 MG tablet    2. Schizophrenia, unspecified type (HCC)  F20.9 benztropine (COGENTIN) 1 MG tablet    3. Tobacco dependence  F17.200 nicotine polacrilex (NICORETTE) 2 MG gum      Past Psychiatric History: schizophrenia, SI, and HI  Past Medical History:  Past Medical History:  Diagnosis Date   Psychosis (HCC) 05/2019   [facility]    Seasonal allergies     Past Surgical History:  Procedure Laterality Date   TONSILLECTOMY  Family Psychiatric History: Denies   Family History:  Family History  Problem Relation Age of Onset   Hypertension Mother     Social History:  Social History   Socioeconomic History   Marital status: Single    Spouse name: Not on file   Number of children: Not on file   Years of education: Not on file   Highest education level: Not on file  Occupational History   Not  on file  Tobacco Use   Smoking status: Every Day    Packs/day: 1.00    Types: Cigarettes   Smokeless tobacco: Former  Building services engineer Use: Never used  Substance and Sexual Activity   Alcohol use: Not Currently    Comment: rare   Drug use: No   Sexual activity: Not Currently  Other Topics Concern   Not on file  Social History Narrative   Not on file   Social Determinants of Health   Financial Resource Strain: Not on file  Food Insecurity: Unknown (05/01/2022)   Hunger Vital Sign    Worried About Running Out of Food in the Last Year: Patient refused    Ran Out of Food in the Last Year: Patient refused  Transportation Needs: Unknown (05/01/2022)   PRAPARE - Administrator, Civil Service (Medical): Patient refused    Lack of Transportation (Non-Medical): Patient refused  Physical Activity: Not on file  Stress: Not on file  Social Connections: Not on file    Allergies:  Allergies  Allergen Reactions   Haldol [Haloperidol] Other (See Comments)    Muscle spasms- involuntary muscle contractions   Geodon [Ziprasidone] Other (See Comments)    Patient reports that this medicine made him feel bad. He has difficult describing symptoms. He requests not to take anymore.     Metabolic Disorder Labs: Lab Results  Component Value Date   HGBA1C 5.4 05/02/2022   MPG 108 05/02/2022   MPG 91.06 01/23/2022   Lab Results  Component Value Date   PROLACTIN 26.5 (H) 09/08/2019   Lab Results  Component Value Date   CHOL 162 05/02/2022   TRIG 71 05/02/2022   HDL 52 05/02/2022   CHOLHDL 3.1 05/02/2022   VLDL 14 05/02/2022   LDLCALC 96 05/02/2022   LDLCALC 91 01/23/2022   Lab Results  Component Value Date   TSH 3.062 05/02/2022   TSH 3.008 01/23/2022    Therapeutic Level Labs: No results found for: "LITHIUM" Lab Results  Component Value Date   VALPROATE 82 05/10/2022   VALPROATE 130 (H) 05/07/2022   Lab Results  Component Value Date   CBMZ <2.0 (L)  05/14/2021   CBMZ 2.5 (L) 11/20/2019    Current Medications: Current Outpatient Medications  Medication Sig Dispense Refill   benztropine (COGENTIN) 1 MG tablet Take 1 tablet (1 mg total) by mouth 2 (two) times daily. 60 tablet 3   divalproex (DEPAKOTE) 500 MG DR tablet Take 1 tablet (500 mg total) by mouth every 12 (twelve) hours. 60 tablet 3   fluPHENAZine (PROLIXIN) 10 MG tablet Take 2 tablets (20 mg total) by mouth daily. 60 tablet 3   fluPHENAZine decanoate (PROLIXIN) 25 MG/ML injection Inject 1 mL (25 mg total) into the muscle every 14 (fourteen) days for 1 dose. Next dose is due on 05-17-2022 1 mL 0   hydrOXYzine (ATARAX) 25 MG tablet Take 1 tablet (25 mg total) by mouth 3 (three) times daily as needed for anxiety. 90 tablet 3   mirtazapine (  REMERON) 30 MG tablet Take 1 tablet (30 mg total) by mouth at bedtime. 30 tablet 3   nicotine polacrilex (NICORETTE) 2 MG gum Take 1 each (2 mg total) by mouth as needed for smoking cessation. 100 tablet 3   No current facility-administered medications for this visit.     Musculoskeletal: Strength & Muscle Tone: within normal limits and telehealth visit Gait & Station: normal, telehealth visit Patient leans: N/A  Psychiatric Specialty Exam: Review of Systems  There were no vitals taken for this visit.There is no height or weight on file to calculate BMI.  General Appearance: Well Groomed  Eye Contact:  Good  Speech:  Clear and Coherent and Normal Rate  Volume:  Normal  Mood:  Anxious and Depressed  Affect:  Appropriate and Congruent  Thought Process:  Coherent, Goal Directed, and Linear  Orientation:  Full (Time, Place, and Person)  Thought Content: Logical   Suicidal Thoughts:  No  Homicidal Thoughts:  No  Memory:  Immediate;   Fair Recent;   Fair Remote;   Fair  Judgement:  Fair  Insight:  Fair and Lacking  Psychomotor Activity:  Normal  Concentration:  Concentration: Fair and Attention Span: Fair  Recall:  [employer] of  Knowledge: Good  Language: Good  Akathisia:  No  Handed:  Right  AIMS (if indicated): not done  Assets:  Communication Skills Desire for Improvement Housing Leisure Time Physical Health  ADL's:  Intact  Cognition: WNL  Sleep:  Poor   Screenings: AIMS    Flowsheet Row Admission (Discharged) from 05/01/2022 in [place] [facility] Admission (Discharged) from 01/20/2022 in [place] [facility] Admission (Discharged) from 11/16/2019 in [place] [facility] Admission (Discharged) from 10/19/2019 in [place] [facility] Admission (Discharged) from 09/07/2019 in [place] [facility]  AIMS Total Score 0 0 0 0 0      AUDIT    Flowsheet Row Admission (Discharged) from 01/20/2022 in [place] [facility] Admission (Discharged) from 05/18/2021 in [place] Admission (Discharged) from 11/16/2019 in [place] [facility] Admission (Discharged) from 10/19/2019 in [place] [facility] Admission (Discharged) from 09/07/2019 in [place] 500B  Alcohol Use Disorder Identification Test Final Score (AUDIT) 0 2 0 0 0      GAD-7    Flowsheet Row Office Visit from 02/18/2022 in [facility]  Total GAD-7 Score 1      PHQ2-9    [place] Office Visit from 02/18/2022 in [facility] Office Visit from 07/19/2020 in [place] ED from 05/29/2020 in [facility]  PHQ-2 Total Score 0 2 1  PHQ-9 Total Score 0 8 --      Flowsheet Row Admission (Discharged) from 05/01/2022 in [place] 400B Most recent reading at 05/01/2022  3:00 PM ED from 05/01/2022 in [facility] Emergency Department at [facility] Most recent reading at  05/01/2022  2:16 AM ED from 04/15/2022 in [facility] Emergency Department at [facility] Most recent reading at 04/15/2022  8:40 PM  C-SSRS RISK CATEGORY No Risk Error: Q3, 4, or 5 should not be populated when Q2 is No Error: Q3, 4, or 5 should not be populated when Q2 is No        Assessment and Plan: Patient endorses symptoms  of insomnia.  Patient irritable today after being told that Ambien would not be prescribed due to misuse.  Today mirtazapine increased from 15 mg to 30 mg to help with sleep.  At this time patient wishes not to continue Prolixin injections.  Today he is agreeable to taking oral Prolixin 20 mg daily.  He will continue other medications as prescribed.   1. Primary insomnia  Continue- benztropine (COGENTIN) 1 MG tablet; Take 1 tablet (1 mg total) by mouth 2 (two) times daily.  Dispense: 60 tablet; Refill: 3 Continue- hydrOXYzine (ATARAX) 25 MG tablet; Take 1 tablet (25 mg total) by mouth 3 (three) times daily as needed for anxiety.  Dispense: 90 tablet; Refill: 3 Increased- mirtazapine (REMERON) 30 MG tablet; Take 1 tablet (30 mg total) by mouth at bedtime.  Dispense: 30 tablet; Refill: 3  2. Schizophrenia, unspecified type ([place])  Continue- benztropine (COGENTIN) 1 MG tablet; Take 1 tablet (1 mg total) by mouth 2 (two) times daily.  Dispense: 60 tablet; Refill: 3 Continue- divalproex (DEPAKOTE) 500 MG DR tablet; Take 1 tablet (500 mg total) by mouth every 12 (twelve) hours.  Dispense: 60 tablet; Refill: 3 Start- fluPHENAZine (PROLIXIN) 10 MG tablet; Take 2 tablets (20 mg total) by mouth daily.  Dispense: 60 tablet; Refill: 3  3. Tobacco dependence  Continue- nicotine polacrilex (NICORETTE) 2 MG gum; Take 1 each (2 mg total) by mouth as needed for smoking cessation.  Dispense: 100 tablet; Refill: 3  4. Anxiety state  Continue- hydrOXYzine (ATARAX) 25 MG tablet; Take 1 tablet (25 mg total) by mouth 3 (three) times daily as needed for anxiety.  Dispense: 90  tablet; Refill: 3 Increased- mirtazapine (REMERON) 30 MG tablet; Take 1 tablet (30 mg total) by mouth at bedtime.  Dispense: 30 tablet; Refill: 3   Collaboration of Care: Collaboration of Care: Other provider involved in patient's care AEB shot clinic staff  Patient/Guardian was advised Release of Information must be obtained prior to any record release in order to collaborate their care with an outside provider. Patient/Guardian was advised if they have not already done so to contact the registration department to sign all necessary forms in order for [place] to release information regarding their care.   Consent: Patient/Guardian gives verbal consent for treatment and assignment of benefits for services provided during this visit. Patient/Guardian expressed understanding and agreed to proceed.   Follow-up in 3 months    Salley Slaughter, NP 05/21/2022, 11:04 AM

## 2022-06-06 ENCOUNTER — Ambulatory Visit (HOSPITAL_COMMUNITY)
Admission: EM | Admit: 2022-06-06 | Discharge: 2022-06-06 | Disposition: A | Discharge status: Home / Self Care | Payer: 59 | Attending: Psychiatry | Admitting: Psychiatry

## 2022-06-06 DIAGNOSIS — Z91148 Patient's other noncompliance with medication regimen for other reason: Secondary | ICD-10-CM

## 2022-06-06 DIAGNOSIS — F209 Schizophrenia, unspecified: Secondary | ICD-10-CM | POA: Diagnosis not present

## 2022-06-06 MED ORDER — BENZTROPINE MESYLATE 1 MG PO TABS
1.0000 mg | ORAL_TABLET | Freq: Two times a day (BID) | ORAL | Status: DC
  Administered 2022-06-06: 1 mg via ORAL
  Filled 2022-06-06: qty 1

## 2022-06-06 MED ORDER — DIVALPROEX SODIUM 500 MG PO DR TAB
500.0000 mg | DELAYED_RELEASE_TABLET | Freq: Two times a day (BID) | ORAL | Status: DC
  Administered 2022-06-06: 500 mg via ORAL
  Filled 2022-06-06: qty 1

## 2022-06-06 MED ORDER — FLUPHENAZINE HCL 5 MG PO TABS
20.0000 mg | ORAL_TABLET | Freq: Every day | ORAL | Status: DC
  Administered 2022-06-06: 20 mg via ORAL
  Filled 2022-06-06: qty 4

## 2022-06-06 NOTE — ED Provider Notes (Signed)
Behavioral Health Urgent Care Medical Screening Exam  Patient Name: Brendan Hogan MRN: [id number] Date of Evaluation: 06/06/22 Chief Complaint: "relaxing" Diagnosis:  Final diagnoses:  Noncompliance with medication regimen  Schizophrenia, unspecified type ([place])   History of Present illness: Brendan Hogan is a 27 y.o. male. Pt presents voluntarily to [facility] behavioral health for walk-in assessment.  Pt is accompanied by his mother and his father/legal guardian, Brendan Hogan. Pt's father remains with pt throughout the assessment as per pt verbal consent/request. Pt is assessed face-to-face by nurse practitioner.   Brendan Hogan, 27 y.o., male patient seen face to face by this provider, consulted with Dr. Dwyane Dee; and chart reviewed on 06/06/22.  Per chart review, pt with history of schizophrenia. Per chart review, pt admitted to [facility] from 05/01/22-05/11/22 under IVC for bizarre behaviors, agitation, appearing to respond to internal stimuli. Medication list at discharge includes benztropine mesylate 29m oral 2 times daily, divalproex sodium 5043moral every 12 hours, fluphenazine decanoate 2561mM every 14 days, hydroxyzine hcl 68m20mal 3 times daily prn, mirtazapine 15mg42ml daily at bedtime, nicotine polacrilex 2mg o20m as needed. [facility] Pt's medications at that time included cogentin 1mg by29muth 2 times daily, hydroxyzine 68mg by100mth 3 times daily as needed for anxiety, mirtazapine 30mg by 46mh at bedtime, divalproex 500mg dr b22muth every 12 hours, prolixin 20mg by mo57mdaily (had changed from injectable to oral during this visit). Pt was also noted to be asking for ambien.  On evaluation, when asked reason for presenting today, Dixie O HXANDAR PALAZZOLOelaxing". Per pt's father, Brendan Hogan, [facility] and had been doing well. He states pt did not receive injectable medication since  discharge. I discussed with him it appeared that fluphenazine injectable had been discontinued and changed to oral fluphenazine. He states pt is not taking his medications as prescribed. He believes pt may only be taking remeron. He feels pt should be put back on injectable. He reports psychiatric decompensation became more notable 5 days ago. He states pt is not concentrating, is staring off into space, is laughing inappropriately, eating less than normal. He feels pt is not at his baseline. He reports pt has behavioral health services that come to the home. When asked if pt has an ACT team he states he's not sure if this is what it is. Chart reviewed, and during last hospitalization, pt [facility]. He and pt gave verbal consent for me to reach out to [facility].  Pt reports euthymic mood. Has constricted, bizarre affect with intense eye contact. He denies suicidal, homicidal ideations. He denies auditory visual hallucinations or paranoia. He denies history of non suicidal self injurious behavior, suicide attempt. He reports history of inpatient psychiatric hospitalization, most recently last month. He admits that he has not been taking his psychiatric medications. When asked reason for this, he does not give a reason. He denies use of any substance, including alcohol, marijuana, nicotine, other substances. He reports he is living with his father and mother. He denies access to a firearm or other weapon. When asked about his appetite, he reports it is poor, he is eating 1 meal/day, prepared by his mother. When asked about sleep, he reports he is sleeping 6 hours/night, is tired during the day. His responses are delayed, and there appears to be thought blocking.   Pt's father would like  for pt to receive dose of medications prior to discharge. Discussed with pt and pt's father providing dose of cogentin 67m, depakote 5055m and prolixin 2011mhile here. Pt agreed  to taking medication.  Called [facility] ACT crisis line, [phone] spoke with staff who confirmed pt is fairly new to their ACT team. They state they can have staff come out to pt tomorrow between 10AM to 12PM. Reviewed pt's current medications. Discussed will be providing pt dose of cogentin 1mg69mepakote 500mg25md prolixin 20mg 53me at this facility.   Discussed with pt and pt's father and clarified that pt is receiving ACT services with [facility]. Discussed that ACT team will be coming out to their home tomorrow between 10AM to 12PM. Pt and pt's father verbalized understanding and agreed to meet with ACT tomorrow.   [facility]  4:10 PM [facility]  3:00 PM [facility] 05/01/2022  2:16 AM  C-SSRS RISK CATEGORY No Risk No Risk Error: Q3, 4, or 5 should not be populated when Q2 is No       Psychiatric Specialty Exam  Presentation  General Appearance:Appropriate for Environment; Casual; Fairly Groomed  Eye Contact:Other (comment) (intense)  Speech:Normal Rate  Speech Volume:Normal  Handedness:Right   Mood and Affect  Mood: Euthymic  Affect: Constricted   Thought Process  Thought Processes: Coherent  Descriptions of Associations:Intact  Orientation:Full (Time, Place and Person)  Thought Content:Logical  Diagnosis of Schizophrenia or Schizoaffective disorder in past: Yes  Duration of Psychotic Symptoms: Greater than six months  Hallucinations:None  Ideas of Reference:None  Suicidal Thoughts:No  Homicidal Thoughts:No   Sensorium  Memory: Immediate Fair  Judgment: Poor  Insight: Shallow   Executive Functions   Concentration: Poor  Attention Span: Poor  Recall: Fair  [employer] Corporationowledge: Fair  Language: Fair   Psychomotor Activity  Psychomotor Activity: Restlessness   Assets  Assets: CommunArmed forces logistics/support/administrative officerre for Improvement; Financial Resources/Insurance; Housing; Resilience; Physical Health; Social Support   Sleep  Sleep: Poor  Number of hours:  0 (6 hours/night)   Physical Exam: Physical Exam Constitutional:      General: He is not in acute distress.    Appearance: He is not ill-appearing, toxic-appearing or diaphoretic.  Eyes:     General: No scleral icterus. Cardiovascular:     Rate and Rhythm: Normal rate.  Pulmonary:     Effort: Pulmonary effort is normal. No respiratory distress.  Skin:    General: Skin is warm and dry.  Neurological:     Mental Status: He is alert and oriented to person, place, and time.  Psychiatric:        Attention and Perception: Perception normal. He is inattentive.        Mood and Affect: Mood normal.        Speech: Speech is delayed.        Behavior: Behavior is slowed. Behavior is cooperative.        Thought Content: Thought content normal.    Review of Systems  Constitutional:  Negative for chills and fever.  Respiratory:  Negative for shortness of breath.   Cardiovascular:  Negative for chest pain and palpitations.  Gastrointestinal:  Negative for abdominal pain.  Neurological:  Negative for headaches.   Blood  pressure 123/88, pulse 88, temperature (!) 97.5 F (36.4 C), temperature source Oral, resp. rate 16, SpO2 97 %. There is no height or weight on file to calculate BMI.  Musculoskeletal: Strength & Muscle Tone: within normal limits Gait & Station: normal Patient leans: N/A  [facility] MSE Discharge Disposition for Follow up and Recommendations: Based on my evaluation the patient does not appear to have an emergency medical condition and can be discharged with resources and follow up care in outpatient services for  with his ACT team, [facility]  Tharon Aquas, NP 06/06/2022, 6:16 PM

## 2022-06-06 NOTE — Discharge Instructions (Addendum)
Please follow up with outpatient behavioral health services with your ACT team, [facility]. They will be going to your home tomorrow, 06/07/22, between 10AM to 12PM.  ACT number: [phone] ACT crisis number: [phone]  Patient is instructed prior to discharge to: Take all medications as prescribed by his/her mental healthcare provider. Report any adverse effects and or reactions from the medicines to his/her outpatient provider promptly. Keep all scheduled appointments, to ensure that you are getting refills on time and to avoid any interruption in your medication.  If you are unable to keep an appointment call to reschedule.  Be sure to follow-up with resources and follow-up appointments provided.  Patient has been instructed & cautioned: To not engage in alcohol and or illegal drug use while on prescription medicines. In the event of worsening symptoms, patient is instructed to call the crisis hotline, 911 and or go to the nearest ED for appropriate evaluation and treatment of symptoms. To follow-up with his/her primary care provider for your other medical issues, concerns and or health care needs.  Information: -National Suicide Prevention Lifeline 1-800-SUICIDE or 754-674-5450.  -988 offers 24/7 access to trained crisis counselors who can help people experiencing mental health-related distress. People can call or text 988 or chat 988lifeline.org for themselves or if they are worried about a loved one who may need crisis support.

## 2022-06-06 NOTE — Progress Notes (Signed)
   06/06/22 1556  E. Lopez (Walk-ins at [facility] only)  How Did You Hear About [place]? Family/Friend  What Is the Reason for Your Visit/Call Today? Patient is diagnosed with schizophrenia and was recently at [facility] for inpatient treatment.  He was discharged May 08, 2022.  Father,  Corry Ihnen, who is present with patient states that he was okay when he was discharged from the hospital, but stopped taking after fifteen days.  Father states that patient has been spiraling since he stopped taking his medications. Patient is not able to provide any any helpful information at present.  Patient has been disorganized, disoriented, confused, anxious, increased forgetfulness, not following conversations and not able to respond appropriately.  He is constantly anxious and pacing. He has been so confused at times that he has almost walked into traffic. Denies current hallucinations, but has a history of hearing voices.  Patient has never been suicidal/homicidal. Patient has been abusing CBD pills. Patient has a bizarre and strange affect.  Patient is urgent.  How Long Has This Been Causing You Problems? 1 wk - 1 month  Have You Recently Had Any Thoughts About Hurting Yourself? No  Are You Planning to Commit Suicide/Harm Yourself At This time? No  Have you Recently Had Thoughts About [place]? No  Are You Planning To Harm Someone At This Time? No  Are you currently experiencing any auditory, visual or other hallucinations? No  Have You Used Any Alcohol or Drugs in the Past 24 Hours? No  Do you have any current medical co-morbidities that require immediate attention? No  Clinician description of patient physical appearance/behavior: patient is very restless, speaking incoherrently at time.  What Do You Feel Would Help You the Most Today? Treatment for Depression or other mood problem  If access to [facility] was not available, would you have sought care in the Emergency Department? Yes   Determination of Need Urgent (48 hours)  Options For Referral Inpatient Hospitalization;BH Urgent Care;Facility-Based Crisis

## 2022-06-06 NOTE — Progress Notes (Signed)
   06/06/22 1556  [place] (Walk-ins at [facility] only)  How Did You Hear About [place]? Family/Friend  What Is the Reason for Your Visit/Call Today? Patient is diagnosed with schizophrenia and was recently at [facility] for inpatient treatment.  He was discharged May 08, 2022.  Father,  Brendan Hogan, who is present with patient states that he was okay when he was discharged from the hospital, but stopped taking after fifteen days.  Father states that patient has been spiraling since he stopped taking his medications. Patient is not able to provide any any helpful information at present.  Patient has been disorganized, disoriented, confused, anxious, increased forgetfulness, not following conversations and not able to respond appropriately most of the time, but can have lucid moments.  He is constantly anxious and pacing. He has been so confused at times that he has almost walked into traffic. Denies current hallucinations, but has a history of hearing voices.  Patient has never been suicidal/homicidal. Patient has been abusing CBD pills. Patient is not sleeping and is requesting ambien.  He is also experiencing a decreased appetite. Patient has a bizarre and strange affect.  Patient is urgent.  How Long Has This Been Causing You Problems? 1 wk - 1 month  Have You Recently Had Any Thoughts About Hurting Yourself? No  Are You Planning to Commit Suicide/Harm Yourself At This time? No  Have you Recently Had Thoughts About [place]? No  Are You Planning To Harm Someone At This Time? No  Are you currently experiencing any auditory, visual or other hallucinations? No  Have You Used Any Alcohol or Drugs in the Past 24 Hours? No  Do you have any current medical co-morbidities that require immediate attention? No  Clinician description of patient physical appearance/behavior: patient is very restless, speaking incoherrently at times  What Do You Feel Would Help You the Most Today? Treatment for  Depression or other mood problem  If access to [facility] was not available, would you have sought care in the Emergency Department? Yes  Determination of Need Urgent (48 hours)  Options For Referral Inpatient Hospitalization;BH Urgent Care;Facility-Based Crisis

## 2022-06-06 NOTE — ED Notes (Signed)
Patient was discharged by the provider. Patient was given AVS with community resources.

## 2022-06-21 ENCOUNTER — Emergency Department (HOSPITAL_COMMUNITY)
Admission: EM | Admit: 2022-06-21 | Discharge: 2022-06-21 | Disposition: A | Discharge status: Home / Self Care | Payer: 59 | Attending: Emergency Medicine | Admitting: Emergency Medicine

## 2022-06-21 ENCOUNTER — Emergency Department (HOSPITAL_COMMUNITY): Payer: 59

## 2022-06-21 DIAGNOSIS — R4182 Altered mental status, unspecified: Secondary | ICD-10-CM | POA: Diagnosis present

## 2022-06-21 DIAGNOSIS — F2089 Other schizophrenia: Secondary | ICD-10-CM | POA: Diagnosis not present

## 2022-06-21 LAB — ETHANOL: Alcohol, Ethyl (B): <10 mg/dL (ref <10)

## 2022-06-21 LAB — ACETAMINOPHEN LEVEL: Acetaminophen (Tylenol), Serum: <10 ug/mL — ABNORMAL LOW (ref 10–30)

## 2022-06-21 LAB — CBC WITH DIFFERENTIAL/PLATELET
Abs Immature Granulocytes: 0.01 10*3/uL (ref 0.00–0.07)
Basophils Absolute: 0.1 10*3/uL (ref 0.0–0.1)
Basophils Relative: 1 %
Eosinophils Absolute: 0.1 10*3/uL (ref 0.0–0.5)
Eosinophils Relative: 1 %
HCT: 45.4 % (ref 39.0–52.0)
Hemoglobin: 14.6 g/dL (ref 13.0–17.0)
Immature Granulocytes: 0 %
Lymphocytes Relative: 33 %
Lymphs Abs: 2.3 10*3/uL (ref 0.7–4.0)
MCH: 28.9 pg (ref 26.0–34.0)
MCHC: 32.2 g/dL (ref 30.0–36.0)
MCV: 89.9 fL (ref 80.0–100.0)
Monocytes Absolute: 0.8 10*3/uL (ref 0.1–1.0)
Monocytes Relative: 12 %
Neutro Abs: 3.8 10*3/uL (ref 1.7–7.7)
Neutrophils Relative %: 53 %
Platelets: 233 10*3/uL (ref 150–400)
RBC: 5.05 MIL/uL (ref 4.22–5.81)
RDW: 13.3 % (ref 11.5–15.5)
WBC: 7.1 10*3/uL (ref 4.0–10.5)
nRBC: 0 % (ref 0.0–0.2)

## 2022-06-21 LAB — COMPREHENSIVE METABOLIC PANEL
ALT: 16 U/L (ref 0–44)
AST: 43 U/L — ABNORMAL HIGH (ref 15–41)
Albumin: 4.1 g/dL (ref 3.5–5.0)
Alkaline Phosphatase: 58 U/L (ref 38–126)
Anion gap: 8 (ref 5–15)
BUN: 20 mg/dL (ref 6–20)
CO2: 24 mmol/L (ref 22–32)
Calcium: 9 mg/dL (ref 8.9–10.3)
Chloride: 109 mmol/L (ref 98–111)
Creatinine, Ser: 1.02 mg/dL (ref 0.61–1.24)
GFR, Estimated: >60 mL/min (ref >60)
Glucose, Bld: 126 mg/dL — ABNORMAL HIGH (ref 70–99)
Potassium: 3.3 mmol/L — ABNORMAL LOW (ref 3.5–5.1)
Sodium: 141 mmol/L (ref 135–145)
Total Bilirubin: 0.9 mg/dL (ref 0.3–1.2)
Total Protein: 7.3 g/dL (ref 6.5–8.1)

## 2022-06-21 LAB — SALICYLATE LEVEL: Salicylate Lvl: <7 mg/dL — ABNORMAL LOW (ref 7.0–30.0)

## 2022-06-21 NOTE — ED Notes (Signed)
Pt exiting room once again. Was redirected back to room with parents. Pt remains non-verbal. Currently watching tv without any distress

## 2022-06-21 NOTE — ED Notes (Signed)
Discussed need for blood with parents. Pt unwilling to give blood. Pending bed assignment

## 2022-06-21 NOTE — Discharge Instructions (Signed)
You were seen today for confusion.  This is likely related to your known schizophrenia.  Follow-up with your ACT team today for assessment and reevaluation of your medications.

## 2022-06-21 NOTE — ED Provider Notes (Signed)
[facility] Provider Note   CSN: [id number] Arrival date & time: 06/21/22  0134     History {Add pertinent medical, surgical, social history, OB history to HPI:1} Chief Complaint  Patient presents with   Altered Mental Status    Brendan Hogan is a 27 y.o. male.  HPI     This is a 27 year old male brought in by his parents with concerns for altered mental status.  He has a history of schizophrenia.  Parents reported the last 2 days he has had bizarre behavior and has seemed more confused.  They describe him "walking away and we do not know where he is going."  He also has not been speaking as much.  No recent changes in meds.  They were seen and evaluated at behavioral health urgent care with concerns for him not taking his medications at home.  He took his injectable meds prior to discharge there on 2/9.  Patient has no complaints for me.  He is technically oriented x 3.  He is able to tell me that he is not suicidal or homicidal.  Home Medications Prior to Admission medications   Medication Sig Start Date End Date Taking? Authorizing Provider  benztropine (COGENTIN) 1 MG tablet Take 1 tablet (1 mg total) by mouth 2 (two) times daily. 05/21/22   Salley Slaughter, NP  divalproex (DEPAKOTE) 500 MG DR tablet Take 1 tablet (500 mg total) by mouth every 12 (twelve) hours. 05/21/22   Salley Slaughter, NP  fluPHENAZine (PROLIXIN) 10 MG tablet Take 2 tablets (20 mg total) by mouth daily. 05/21/22   Salley Slaughter, NP  hydrOXYzine (ATARAX) 25 MG tablet Take 1 tablet (25 mg total) by mouth 3 (three) times daily as needed for anxiety. 05/21/22   Salley Slaughter, NP  mirtazapine (REMERON) 30 MG tablet Take 1 tablet (30 mg total) by mouth at bedtime. 05/21/22   Salley Slaughter, NP  nicotine polacrilex (NICORETTE) 2 MG gum Take 1 each (2 mg total) by mouth as needed for smoking cessation. 05/21/22   Salley Slaughter, NP       Allergies    Haldol [haloperidol] and Geodon [ziprasidone]    Review of Systems   Review of Systems  Psychiatric/Behavioral:  Negative for self-injury and suicidal ideas. The patient is not nervous/anxious.   All other systems reviewed and are negative.   Physical Exam Updated Vital Signs BP 127/89 (BP Location: Left Arm)   Pulse 96   Temp 97.9 F (36.6 C) (Oral)   Resp 14   SpO2 98%  Physical Exam Vitals and nursing note reviewed.  Constitutional:      Appearance: He is well-developed. He is not ill-appearing.  HENT:     Head: Normocephalic and atraumatic.     Nose: Nose normal.     Mouth/Throat:     Mouth: Mucous membranes are moist.  Eyes:     Pupils: Pupils are equal, round, and reactive to light.  Cardiovascular:     Rate and Rhythm: Normal rate and regular rhythm.     Heart sounds: Normal heart sounds. No murmur heard. Pulmonary:     Effort: Pulmonary effort is normal. No respiratory distress.     Breath sounds: Normal breath sounds. No wheezing.  Abdominal:     Palpations: Abdomen is soft.     Tenderness: There is no abdominal tenderness.  Musculoskeletal:     Cervical back: Neck supple.  Lymphadenopathy:  Cervical: No cervical adenopathy.  Skin:    General: Skin is warm and dry.  Neurological:     Mental Status: He is alert and oriented to person, place, and time.  Psychiatric:     Comments: Bizarre affect, responds minimally to questioning, occasional blank stares and flat affect     ED Results / Procedures / Treatments   Labs (all labs ordered are listed, but only abnormal results are displayed) Labs Reviewed  COMPREHENSIVE METABOLIC PANEL - Abnormal; Notable for the following components:      Result Value   Potassium 3.3 (*)    Glucose, Bld 126 (*)    AST 43 (*)    All other components within normal limits  SALICYLATE LEVEL - Abnormal; Notable for the following components:   Salicylate Lvl [id number] (*)    All other components within normal  limits  ACETAMINOPHEN LEVEL - Abnormal; Notable for the following components:   Acetaminophen (Tylenol), Serum <10 (*)    All other components within normal limits  ETHANOL  CBC WITH DIFFERENTIAL/PLATELET  RAPID URINE DRUG SCREEN, HOSP PERFORMED  CBG MONITORING, ED    EKG None  Radiology No results found.  Procedures Procedures  {Document cardiac monitor, telemetry assessment procedure when appropriate:1}  Medications Ordered in ED Medications - No data to display  ED Course/ Medical Decision Making/ A&P   {   Click here for ABCD2, HEART and other calculatorsREFRESH Note before signing :1}                          Medical Decision Making Amount and/or Complexity of Data Reviewed Labs: ordered. Radiology: ordered.   ***  {Document critical care time when appropriate:1} {Document review of labs and clinical decision tools ie heart score, Chads2Vasc2 etc:1}  {Document your independent review of radiology images, and any outside records:1} {Document your discussion with family members, caretakers, and with consultants:1} {Document social determinants of health affecting pt's care:1} {Document your decision making why or why not admission, treatments were needed:1} Final Clinical Impression(s) / ED Diagnoses Final diagnoses:  None    Rx / DC Orders ED Discharge Orders     None

## 2022-06-21 NOTE — ED Triage Notes (Signed)
Pt was brought in by parents d/t concerns in behavior changes. Pt is confused that started 2 days ago, has had decreased sleep, and limping yesterday. At baseline parents report A&Ox4, currently confused to place and situation. Not interacting with parents appropriately. Hx psychosis.

## 2022-06-21 NOTE — ED Notes (Signed)
Gave discharge papers to parents and they are are going to try to wake patient up and head out

## 2022-06-21 NOTE — ED Notes (Signed)
Pt exited room. Was redirected by this RN. Offered to take blood again. Pt grab both Rns arms and shock head no.

## 2022-06-21 NOTE — ED Notes (Signed)
Pt refused blood draw.   Informed RN.

## 2022-06-27 ENCOUNTER — Other Ambulatory Visit (HOSPITAL_COMMUNITY): Payer: Self-pay | Admitting: Psychiatry

## 2022-06-27 ENCOUNTER — Ambulatory Visit: Payer: 59 | Admitting: Family Medicine

## 2022-06-27 VITALS — BP 124/88 | HR 89 | Ht 66.0 in | Wt 148.6 lb

## 2022-06-27 DIAGNOSIS — R5383 Other fatigue: Secondary | ICD-10-CM | POA: Diagnosis not present

## 2022-06-27 DIAGNOSIS — F209 Schizophrenia, unspecified: Secondary | ICD-10-CM

## 2022-06-27 NOTE — Progress Notes (Signed)
    SUBJECTIVE:   CHIEF COMPLAINT / HPI:  Chief Complaint  Patient presents with   Back Pain   Headache   Here with father  Feeling fatigued ever since he has been on fluphenazine injections which he started 2 months ago. Worse in the past 2 days. Also having unsteadiness, not eating well, increased drooling but no difficulty swallowing.  He is followed by the ACT team.  Took one hydroxyzine 90m yesterday Was on fluphenazine injections every 2 weeks but was getting too fatigued with this, was recently changed back to oral fluphenazine 25 mg starting yesterday. Last injection was 3 weeks ago.  PERTINENT  PMH / PSH: Schizophrenia  Patient Care Team: HColletta Maryland MD as PCP - General (Family Medicine) HSherrilyn Rist NT as Technician   OBJECTIVE:   BP 124/88   Pulse 89   Ht 5' 6"$  (1.676 m)   Wt 148 lb 9.6 oz (67.4 kg)   SpO2 100%   BMI 23.98 kg/m   Physical Exam Constitutional:      General: He is not in acute distress. Cardiovascular:     Rate and Rhythm: Normal rate and regular rhythm.  Pulmonary:     Effort: Pulmonary effort is normal. No respiratory distress.     Breath sounds: Normal breath sounds.  Neurological:     General: No focal deficit present.     Mental Status: He is alert.     Cranial Nerves: Cranial nerves 2-12 are intact. No cranial nerve deficit.     Motor: No weakness.     Coordination: Finger-Nose-Finger Test normal.     Gait: Gait normal.         06/27/2022    2:09 PM  Depression screen PHQ 2/9  Decreased Interest 1  Down, Depressed, Hopeless 0  PHQ - 2 Score 1  Altered sleeping 2  Tired, decreased energy 2  Change in appetite 1  Feeling bad or failure about yourself  0  Trouble concentrating 1  Moving slowly or fidgety/restless 2  Suicidal thoughts 0  PHQ-9 Score 9     {Show previous vital signs (optional):23777}    ASSESSMENT/PLAN:   Fatigue, unspecified type  Ongoing fatigue for 2 months ever since he started on  fluphenazine, initially on long-acting injection and recently switched to oral tablets.  Neuro exam is reassuring and nonfocal.  Reviewed recent labs 1 week ago with father noting mild hypokalemia potassium 3.3 as well as mild elevation in AST to 43.  Suspect ongoing fatigue is likely medication induced as he is on 3 CNS sedating agents.  No medical cause identified. - advised to discuss with ACT team about medication adjustment - offered to repeat labs today, declined  Return if symptoms worsen or fail to improve.   RZola Button MD [place]

## 2022-06-27 NOTE — Patient Instructions (Signed)
It was nice seeing you today!  Blood work today.  See me in 3 months or whenever is a good for you.  Stay well, Aneudy Champlain, MD [facility] [phone]  --  Make sure to check out at the front desk before you leave today.  Please arrive at least 15 minutes prior to your scheduled appointments.  If you had blood work today, I will send you a MyChart message or a letter if results are normal. Otherwise, I will give you a call.  If you had a referral placed, they will call you to set up an appointment. Please give us a call if you don't hear back in the next 2 weeks.  If you need additional refills before your next appointment, please call your pharmacy first.  

## 2022-07-01 ENCOUNTER — Other Ambulatory Visit (HOSPITAL_COMMUNITY): Payer: Self-pay | Admitting: Psychiatry

## 2022-07-01 DIAGNOSIS — F411 Generalized anxiety disorder: Secondary | ICD-10-CM

## 2022-07-01 DIAGNOSIS — F5101 Primary insomnia: Secondary | ICD-10-CM

## 2022-07-11 ENCOUNTER — Ambulatory Visit (INDEPENDENT_AMBULATORY_CARE_PROVIDER_SITE_OTHER): Payer: 59 | Admitting: Family Medicine

## 2022-07-11 ENCOUNTER — Other Ambulatory Visit: Payer: Self-pay

## 2022-07-11 VITALS — BP 104/73 | HR 99 | Ht 66.0 in | Wt 150.0 lb

## 2022-07-11 DIAGNOSIS — N3944 Nocturnal enuresis: Secondary | ICD-10-CM | POA: Diagnosis not present

## 2022-07-11 DIAGNOSIS — F5101 Primary insomnia: Secondary | ICD-10-CM

## 2022-07-11 NOTE — Assessment & Plan Note (Signed)
Longstanding issue. Apparently had recent med adjustments but the specifics are unclear. Not acutely psychotic. Father thinks he's sleeping more than he reports. Repeatedly asking for [facility] which I declined. Advised he discuss his concerns with his psychiatrist/ACT team.

## 2022-07-11 NOTE — Assessment & Plan Note (Signed)
New issue. No concern for UTI as symptoms nocturnal only. Suspect behavioral component vs medication effect. Again, his current medication regimen is unclear- I advised he discuss with his psychiatrist.

## 2022-07-11 NOTE — Patient Instructions (Addendum)
Please see your psychiatrist and discuss your sleep concerns with them.  Take care, Dr Rock Nephew

## 2022-07-11 NOTE — Progress Notes (Signed)
    SUBJECTIVE:   CHIEF COMPLAINT / HPI:   Insomnia -asking for ambien repeatedly -patient reports he sleeps for 3 hrs -Dad present, thinks he sleeps more than that -goes to sleep at 11pm, gets up at 3am-- eats, then goes back to bed until 9am -states he's not sleeping during this time -also has been wetting the bed at night for the last 5 nights -no incontinence during the day, no dysuria -ACT team comes every Tuesday. Changed his medications this Tuesday- but hasn't started yet. Patient and Dad unsure what this change was.  PERTINENT  PMH / PSH: schizophrenia  OBJECTIVE:   BP 104/73   Pulse 99   Ht 5\' 6"  (1.676 m)   Wt 150 lb (68 kg)   SpO2 99%   BMI 24.21 kg/m   General: NAD, able to participate in exam Respiratory: No respiratory distress Skin: warm and dry, no rashes noted Psych: well-groomed, appropriate hygiene, appropriate eye contact, normal speech. Somewhat blunted affect. Does not appear to be responding to internal stimuli. Calm Neuro: grossly intact   ASSESSMENT/PLAN:   Primary insomnia Longstanding issue. Apparently had recent med adjustments but the specifics are unclear. Not acutely psychotic. Father thinks he's sleeping more than he reports. Repeatedly asking for [facility] which I declined. Advised he discuss his concerns with his psychiatrist/ACT team.  Nocturnal enuresis New issue. No concern for UTI as symptoms nocturnal only. Suspect behavioral component vs medication effect. Again, his current medication regimen is unclear- I advised he discuss with his psychiatrist.     Alcus Dad, MD [place]

## 2022-08-14 ENCOUNTER — Encounter (HOSPITAL_COMMUNITY): Payer: Self-pay

## 2022-08-14 ENCOUNTER — Telehealth (HOSPITAL_COMMUNITY): Payer: Self-pay | Admitting: Psychiatry

## 2022-08-18 ENCOUNTER — Encounter (HOSPITAL_COMMUNITY): Payer: Self-pay

## 2022-08-18 ENCOUNTER — Emergency Department (HOSPITAL_COMMUNITY)
Admission: EM | Admit: 2022-08-18 | Discharge: 2022-08-21 | Disposition: A | Discharge status: Home / Self Care | Payer: PRIVATE HEALTH INSURANCE | Attending: Emergency Medicine | Admitting: Emergency Medicine

## 2022-08-18 ENCOUNTER — Other Ambulatory Visit: Payer: Self-pay

## 2022-08-18 DIAGNOSIS — Z1152 Encounter for screening for COVID-19: Secondary | ICD-10-CM | POA: Insufficient documentation

## 2022-08-18 DIAGNOSIS — R000 Tachycardia, unspecified: Secondary | ICD-10-CM | POA: Diagnosis not present

## 2022-08-18 DIAGNOSIS — F209 Schizophrenia, unspecified: Secondary | ICD-10-CM

## 2022-08-18 DIAGNOSIS — F201 Disorganized schizophrenia: Secondary | ICD-10-CM | POA: Diagnosis present

## 2022-08-18 DIAGNOSIS — F17210 Nicotine dependence, cigarettes, uncomplicated: Secondary | ICD-10-CM | POA: Diagnosis not present

## 2022-08-18 DIAGNOSIS — F29 Unspecified psychosis not due to a substance or known physiological condition: Secondary | ICD-10-CM | POA: Diagnosis not present

## 2022-08-18 DIAGNOSIS — F23 Brief psychotic disorder: Secondary | ICD-10-CM | POA: Diagnosis present

## 2022-08-18 DIAGNOSIS — R441 Visual hallucinations: Secondary | ICD-10-CM

## 2022-08-18 LAB — RAPID URINE DRUG SCREEN, HOSP PERFORMED
Amphetamines: NOT DETECTED
Barbiturates: NOT DETECTED
Benzodiazepines: NOT DETECTED
Cocaine: NOT DETECTED
Opiates: NOT DETECTED
Tetrahydrocannabinol: POSITIVE — AB

## 2022-08-18 LAB — COMPREHENSIVE METABOLIC PANEL
ALT: 27 U/L (ref 0–44)
AST: 31 U/L (ref 15–41)
Albumin: 4.4 g/dL (ref 3.5–5.0)
Alkaline Phosphatase: 83 U/L (ref 38–126)
Anion gap: 13 (ref 5–15)
BUN: 13 mg/dL (ref 6–20)
CO2: 24 mmol/L (ref 22–32)
Calcium: 9.5 mg/dL (ref 8.9–10.3)
Chloride: 101 mmol/L (ref 98–111)
Creatinine, Ser: 0.94 mg/dL (ref 0.61–1.24)
GFR, Estimated: >60 mL/min (ref >60)
Glucose, Bld: 95 mg/dL (ref 70–99)
Potassium: 3.8 mmol/L (ref 3.5–5.1)
Sodium: 138 mmol/L (ref 135–145)
Total Bilirubin: 0.5 mg/dL (ref 0.3–1.2)
Total Protein: 7.8 g/dL (ref 6.5–8.1)

## 2022-08-18 LAB — CBC WITH DIFFERENTIAL/PLATELET
Abs Immature Granulocytes: 0.04 10*3/uL (ref 0.00–0.07)
Basophils Absolute: 0 10*3/uL (ref 0.0–0.1)
Basophils Relative: 1 %
Eosinophils Absolute: 0.1 10*3/uL (ref 0.0–0.5)
Eosinophils Relative: 1 %
HCT: 43.5 % (ref 39.0–52.0)
Hemoglobin: 15 g/dL (ref 13.0–17.0)
Immature Granulocytes: 1 %
Lymphocytes Relative: 14 %
Lymphs Abs: 1 10*3/uL (ref 0.7–4.0)
MCH: 29.2 pg (ref 26.0–34.0)
MCHC: 34.5 g/dL (ref 30.0–36.0)
MCV: 84.6 fL (ref 80.0–100.0)
Monocytes Absolute: 0.6 10*3/uL (ref 0.1–1.0)
Monocytes Relative: 8 %
Neutro Abs: 5.4 10*3/uL (ref 1.7–7.7)
Neutrophils Relative %: 75 %
Platelets: 226 10*3/uL (ref 150–400)
RBC: 5.14 MIL/uL (ref 4.22–5.81)
RDW: 12.4 % (ref 11.5–15.5)
WBC: 7.1 10*3/uL (ref 4.0–10.5)
nRBC: 0 % (ref 0.0–0.2)

## 2022-08-18 LAB — ETHANOL: Alcohol, Ethyl (B): <10 mg/dL (ref <10)

## 2022-08-18 MED ORDER — STERILE WATER FOR INJECTION IJ SOLN
INTRAMUSCULAR | Status: AC
  Administered 2022-08-18: 10 mL
  Filled 2022-08-18: qty 10

## 2022-08-18 MED ORDER — LORAZEPAM 2 MG/ML IJ SOLN
2.0000 mg | Freq: Once | INTRAMUSCULAR | Status: AC
  Administered 2022-08-18: 2 mg via INTRAMUSCULAR
  Filled 2022-08-18: qty 1

## 2022-08-18 MED ORDER — ZIPRASIDONE MESYLATE 20 MG IM SOLR
10.0000 mg | Freq: Once | INTRAMUSCULAR | Status: AC
  Administered 2022-08-18: 10 mg via INTRAMUSCULAR
  Filled 2022-08-18: qty 20

## 2022-08-18 MED ORDER — DIPHENHYDRAMINE HCL 50 MG/ML IJ SOLN
25.0000 mg | Freq: Once | INTRAMUSCULAR | Status: AC
  Administered 2022-08-18: 25 mg via INTRAMUSCULAR
  Filled 2022-08-18: qty 1

## 2022-08-18 MED ORDER — HALOPERIDOL LACTATE 5 MG/ML IJ SOLN
5.0000 mg | Freq: Once | INTRAMUSCULAR | Status: AC
  Administered 2022-08-18: 5 mg via INTRAMUSCULAR
  Filled 2022-08-18: qty 1

## 2022-08-18 NOTE — ED Notes (Signed)
Pt has been changed into burgundy scrubs.   

## 2022-08-18 NOTE — ED Notes (Signed)
Pt was taking trash and brown paper towels and stuffing them into the toilet. Pt was asked to go back to his room and I cleaned out toilet and removed the trash from the area.

## 2022-08-18 NOTE — ED Notes (Signed)
Pts belongings (2 bags w/ name stickers) and have been placed in locker 34  in [place].

## 2022-08-18 NOTE — ED Triage Notes (Signed)
C/o ams, pacing back and forth, and visual hallucinations after coming inside.  Family reports patient was outside about 45 mins.  Pt trying to hit and kick staff on arrival.  Hx schizophrenia.

## 2022-08-18 NOTE — BH Assessment (Signed)
Clinician contacted Brendan Hogan [facility] to complete TTS assessment. Per Brendan Hogan, patient is currently not verbally responsive. Per chart, Pt stopped answering questions. Brendan Hogan & RN asked to notify Clinician if patient begins verbally engaging.    Manfred Arch, MSW, LCSW Triage Specialist

## 2022-08-18 NOTE — ED Provider Notes (Signed)
[place] EMERGENCY DEPARTMENT AT [facility] Provider Note   CSN: [id number] Arrival date & time: 08/18/22  1652     History  Chief Complaint  Patient presents with   Psychiatric Evaluation    Brendan Hogan is a 27 y.o. male with history of schizophrenia brought in by parents for psychiatric evaluation. They report patient was outside for about 45 minutes before coming in, was pacing back and forth. Started having visual hallucinations, attempting to hit and kick staff upon arrival. He admitted to having some CBD gummies prior to arrival. Father reports he has continued his daily remeron, and takes hydroxyzine and fluphenazine PRN.   He was brought here voluntarily, however keeps trying to get out of bed and is combative with staff.  Level 5 caveat due to psychiatric disorder, patient will not answer further questions  HPI     Home Medications Prior to Admission medications   Medication Sig Start Date End Date Taking? Authorizing Provider  benztropine (COGENTIN) 1 MG tablet Take 1 tablet (1 mg total) by mouth 2 (two) times daily. Patient not taking: Reported on 06/27/2022 05/21/22   Toy Cookey E, NP  cloNIDine (CATAPRES) 0.1 MG tablet TAKE 1 TABLET BY MOUTH 2 TIMES DAILY. Patient not taking: Reported on 06/27/2022 06/27/22   Shanna Cisco, NP  divalproex (DEPAKOTE) 500 MG DR tablet Take 1 tablet (500 mg total) by mouth every 12 (twelve) hours. Patient not taking: Reported on 06/27/2022 05/21/22   Shanna Cisco, NP  fluPHENAZine (PROLIXIN) 10 MG tablet Take 2 tablets (20 mg total) by mouth daily. Patient taking differently: Take 25 mg by mouth daily. 05/21/22   Shanna Cisco, NP  hydrOXYzine (ATARAX) 25 MG tablet Take 1 tablet (25 mg total) by mouth 3 (three) times daily as needed for anxiety. 05/21/22   Shanna Cisco, NP  mirtazapine (REMERON) 30 MG tablet Take 1 tablet (30 mg total) by mouth at bedtime. Patient taking differently: Take 30  mg by mouth at bedtime as needed. 05/21/22   Shanna Cisco, NP  nicotine polacrilex (NICORETTE) 2 MG gum Take 1 each (2 mg total) by mouth as needed for smoking cessation. 05/21/22   Shanna Cisco, NP      Allergies    Haldol [haloperidol] and Geodon [ziprasidone]    Review of Systems   Review of Systems  Unable to perform ROS: Psychiatric disorder (Will not answer further questions)  Psychiatric/Behavioral:  Positive for agitation, behavioral problems, hallucinations and sleep disturbance.     Physical Exam Updated Vital Signs BP (!) 133/96 (BP Location: Left Arm)   Pulse (!) 129   Temp 99.1 F (37.3 C) (Oral)   Resp 20   Wt 68 kg   SpO2 98%   BMI 24.20 kg/m  Physical Exam Vitals and nursing note reviewed.  Constitutional:      Appearance: Normal appearance.  HENT:     Head: Normocephalic and atraumatic.  Eyes:     Conjunctiva/sclera: Conjunctivae normal.  Pulmonary:     Effort: Pulmonary effort is normal. No respiratory distress.  Skin:    General: Skin is warm and dry.  Neurological:     Mental Status: He is alert.  Psychiatric:        Attention and Perception: He perceives visual hallucinations.        Mood and Affect: Affect is labile.        Behavior: Behavior is agitated and combative.     Comments: does not  actively appear to be responding to internal stimuli     ED Results / Procedures / Treatments   Labs (all labs ordered are listed, but only abnormal results are displayed) Labs Reviewed  RAPID URINE DRUG SCREEN, HOSP PERFORMED - Abnormal; Notable for the following components:      Result Value   Tetrahydrocannabinol POSITIVE (*)    All other components within normal limits  COMPREHENSIVE METABOLIC PANEL  ETHANOL  CBC WITH DIFFERENTIAL/PLATELET    EKG EKG Interpretation  Date/Time:  Sunday August 18 2022 18:39:59 EDT Ventricular Rate:  114 PR Interval:  130 QRS Duration: 80 QT Interval:  320 QTC Calculation: 441 R Axis:   -2 Text  Interpretation: Sinus tachycardia Minimal voltage criteria for LVH, may be normal variant ( R in aVL ) Nonspecific T wave abnormality Abnormal ECG No significant change since last tracing Confirmed by Melene Plan [phone]) on 08/18/2022 6:51:14 PM  Radiology No results found.  Procedures Procedures    Medications Ordered in ED Medications  diphenhydrAMINE (BENADRYL) injection 25 mg (25 mg Intramuscular Given 08/18/22 1750)  LORazepam (ATIVAN) injection 2 mg (2 mg Intramuscular Given 08/18/22 1749)  haloperidol lactate (HALDOL) injection 5 mg (5 mg Intramuscular Given 08/18/22 1750)  ziprasidone (GEODON) injection 10 mg (10 mg Intramuscular Given 08/18/22 1905)  sterile water (preservative free) injection (10 mLs  Given 08/18/22 1910)    ED Course/ Medical Decision Making/ A&P                             Medical Decision Making Amount and/or Complexity of Data Reviewed Labs: ordered.  Risk Prescription drug management.   Patient is a 27 y.o. male  who presents to the emergency department for psychiatric complaint.  Past Medical History: Schizophrenia, psychosis  Physical Exam: Tachycardic, mildly hypertensive. Labile affect, combative and agitated. Was complaining of visual hallucinations but does not actively appear to be responding to internal stimuli.   Labs: Medical clearance labs ordered, with following pertinent results: CBC and CMP normal, negative ethanol, UDS positive for [facility]  Cardiac monitoring: EKG obtained and interpreted by attending physician which shows: sinus tachycardia, QTC 441  Medications: I ordered medication including B52, geodon  for agitation. I have reviewed the patients home medicines and have made adjustments as needed.  Disposition: Patient is otherwise medically cleared at this time. Will consult TTS and appreciate their recommendations. I believe patient to be experiencing acute psychosis with agitation and visual hallucinations. Reported good  compliance with home medications. He demonstrates a threat to others and I do not believe he demonstrates capacity to make medical decisions, and thus meets criteria for involuntary commitment. IVC order placed and signed by attending physician. Faxed to [employer]   I discussed this case with my attending physician Dr. Adela Lank who cosigned this note including patient's presenting symptoms, physical exam, and planned diagnostics and interventions. Attending physician stated agreement with plan or made changes to plan which were implemented.   Final Clinical Impression(s) / ED Diagnoses Final diagnoses:  Schizophrenia, unspecified type  Visual hallucinations    Rx / DC Orders ED Discharge Orders     None      Portions of this report may have been transcribed using voice recognition software. Every effort was made to ensure accuracy; however, inadvertent computerized transcription errors may be present.    Jeanella Flattery 08/18/22 1946    Melene Plan, DO 08/18/22 2045

## 2022-08-19 MED ORDER — DIVALPROEX SODIUM 500 MG PO DR TAB
500.0000 mg | DELAYED_RELEASE_TABLET | Freq: Two times a day (BID) | ORAL | Status: DC
  Administered 2022-08-19 – 2022-08-21 (×5): 500 mg via ORAL
  Filled 2022-08-19 (×5): qty 1

## 2022-08-19 MED ORDER — BENZTROPINE MESYLATE 1 MG PO TABS
1.0000 mg | ORAL_TABLET | Freq: Two times a day (BID) | ORAL | Status: DC
  Administered 2022-08-19 – 2022-08-21 (×5): 1 mg via ORAL
  Filled 2022-08-19 (×5): qty 1

## 2022-08-19 MED ORDER — FLUPHENAZINE HCL 5 MG PO TABS
10.0000 mg | ORAL_TABLET | Freq: Every day | ORAL | Status: DC
  Administered 2022-08-20 – 2022-08-21 (×2): 10 mg via ORAL
  Filled 2022-08-19 (×2): qty 2

## 2022-08-19 MED ORDER — LORAZEPAM 1 MG PO TABS
1.0000 mg | ORAL_TABLET | Freq: Once | ORAL | Status: AC
  Administered 2022-08-19: 1 mg via ORAL
  Filled 2022-08-19: qty 1

## 2022-08-19 MED ORDER — NICOTINE POLACRILEX 2 MG MT GUM
2.0000 mg | CHEWING_GUM | OROMUCOSAL | Status: DC | PRN
  Filled 2022-08-19: qty 1

## 2022-08-19 MED ORDER — OLANZAPINE 5 MG PO TBDP
5.0000 mg | ORAL_TABLET | Freq: Two times a day (BID) | ORAL | Status: DC
  Administered 2022-08-19 – 2022-08-21 (×5): 5 mg via ORAL
  Filled 2022-08-19 (×5): qty 1

## 2022-08-19 MED ORDER — MIRTAZAPINE 7.5 MG PO TABS
15.0000 mg | ORAL_TABLET | Freq: Every day | ORAL | Status: DC
  Administered 2022-08-19 – 2022-08-20 (×2): 15 mg via ORAL
  Filled 2022-08-19 (×2): qty 2
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
## 2022-08-19 NOTE — ED Notes (Signed)
Patient has been walking around unit. Patient going into all rooms. Patient rediretable at times.  Patient acting drowsy but will not lay down.

## 2022-08-19 NOTE — ED Notes (Signed)
Patient continues to walk around unit. Patient is restless and difficult to redirect. Patient pulls the emergency alarms, patient redirected.

## 2022-08-19 NOTE — ED Notes (Signed)
Patient continues to wander the rooms, entering patients rooms despite being redirected.

## 2022-08-19 NOTE — Progress Notes (Signed)
LCSW Progress Note  [id number]   BYRAN BILOTTI  08/19/2022  1:57 PM  Description:   Inpatient Psychiatric Referral  Patient was recommended inpatient per Dahlia Byes, NP. There are no available beds at [facility], per [facility], RN. Patient was referred to the following facilities:   Destination  Service Provider Address Phone Fax  [facility]  [address], [place] [address] [phone]  [facility] [address]  [address] [place] [address] [phone]  [facility]  [address] [place], [place] [address] [phone]  [facility] [address] [place]  [address] [place] [address] [phone]  [facility]  [address] [place], [place] [address] [phone]  [facility]  [address] [place] [address] [phone]  [facility]  [address] [place] [address] [phone]  [facility]  [address] [place] [address] [phone]  [facility]  [address] [place], [place] [address] [phone]  [facility]  [address] [place] [address] [phone]  [facility]  [address] [place] [address] [phone]  [facility]  [address] [place] [address] [phone]  [facility]  [address] [place] [address] [phone]  [facility]  [address], [place] [address] [phone]  [facility]  [address] [place] [address] [phone]  [facility]  [address] [place] [address]  [phone]  [facility]  [address], [place] [address] [phone]  [facility]  [address] [place] [address] [phone]  [facility]  [address] [place] [address] [phone]  [facility]  [address] [place] [address] [phone]  [facility]  [address], [place] [address] [phone]  [facility]  [address] [place] [address] [phone]  [facility]  [address], [place] [address] [phone]  [facility]  [address] [place] [address] [phone]      Situation ongoing, CSW to continue following and update chart as more information becomes available.      Asencion Islam  08/19/2022 1:57 PM

## 2022-08-19 NOTE — ED Provider Notes (Signed)
Emergency Medicine Observation Re-evaluation Note  Brendan Hogan is a 27 y.o. male, seen on rounds today.  Pt initially presented to the ED for complaints of Psychiatric Evaluation Currently, the patient is resting.  Physical Exam  BP 119/89 (BP Location: Left Arm)   Pulse 89   Temp 97.9 F (36.6 C) (Oral)   Resp 17   Wt 68 kg   SpO2 99%   BMI 24.20 kg/m  Physical Exam General: NAD   ED Course / MDM  EKG:EKG Interpretation  Date/Time:  Sunday August 18 2022 18:39:59 EDT Ventricular Rate:  114 PR Interval:  130 QRS Duration: 80 QT Interval:  320 QTC Calculation: 441 R Axis:   -2 Text Interpretation: Sinus tachycardia Minimal voltage criteria for LVH, may be normal variant ( R in aVL ) Nonspecific T wave abnormality Abnormal ECG No significant change since last tracing Confirmed by Melene Plan [phone]) on 08/18/2022 6:51:14 PM  I have reviewed the labs performed to date as well as medications administered while in observation.  Recent changes in the last 24 hours include no acute events reported.  Plan  Current plan is for completion of TTS/Psych evaluation and likely placement.    Wynetta Fines, MD 08/19/22 [phone]

## 2022-08-19 NOTE — ED Notes (Signed)
Patient continues to be intrusive and needs redirection. Patient difficult to redirect at times.

## 2022-08-19 NOTE — Progress Notes (Signed)
Pt was accepted to [facility] Wednesday 08/21/2022; Bed Assignment Main Campus   Pt meets inpatient criteria per Dahlia Byes, NP   Attending Physician will be Dr. Loni Beckwith   Report can be called [phone]-Pager number, please leave a returned phone number to receive a phone call back.    Pt can arrive after 9:00am   Care Team notified: Day [facility], RN, Evening [facility] Rosey Bath, RN, [place], [place], Dahlia Byes, NP, Lum Babe, RN, [place], LCSWA   Maryjean Ka, MSW, [facility] 08/19/2022 6:45 PM

## 2022-08-19 NOTE — ED Notes (Signed)
Patient continues to walk around and  restless. Patient redirected to lay down because appears sleepy.

## 2022-08-20 DIAGNOSIS — F201 Disorganized schizophrenia: Secondary | ICD-10-CM

## 2022-08-20 MED ORDER — HYDROXYZINE HCL 25 MG PO TABS
25.0000 mg | ORAL_TABLET | Freq: Once | ORAL | Status: AC
  Administered 2022-08-20: 25 mg via ORAL
  Filled 2022-08-20: qty 1

## 2022-08-20 NOTE — ED Notes (Signed)
Pt given a [place] sandwich and sprite

## 2022-08-20 NOTE — ED Notes (Signed)
Pt making a phone call. ?

## 2022-08-20 NOTE — ED Notes (Signed)
Pt has been asleep since start of my shift.  Unable to complete overdue assessments at this time.

## 2022-08-20 NOTE — Progress Notes (Signed)
[facility] Psych ED Progress Note  08/20/2022 3:16 PM Brendan Hogan  MRN:  [id number]   Subjective:  Patient was seen in his room awake watching TV.  He has been asleep since morning and only came out for his needs.  He is compliant with medications.  He is calm and cooperative, speech is coherent, normal tone and volume.  Patient is in agreement to go to any available inpatient Psychiatric unit for stabilization.  Patient has been accepted at [facility] in [facility] for tomorrow morning.  He denies SI/HI /AVH and does not appear to be responding to internal stimuli. Principal Problem: Disorganized schizophrenia Diagnosis:  Principal Problem:   Disorganized schizophrenia Active Problems:   Acute psychosis   ED Assessment Time Calculation: Start Time: 1500 Stop Time: 1516 Total Time in Minutes (Assessment Completion): 16   Past Psychiatric History: see initial psychiatry evaluation note  [place] Scale:  Flowsheet Row ED from 08/18/2022 in [facility] Emergency Department at [facility] ED from 06/21/2022 in [facility] Emergency Department at [facility] ED from 06/06/2022 in [name]  C-SSRS RISK CATEGORY No Risk No Risk No Risk       Past Medical History:  Past Medical History:  Diagnosis Date   Psychosis 05/2019   [facility]    Seasonal allergies     Past Surgical History:  Procedure Laterality Date   TONSILLECTOMY     Family History:  Family History  Problem Relation Age of Onset   Hypertension Mother    Family Psychiatric  History: see initial psychiatry evaluation note Social History:  Social History   Substance and Sexual Activity  Alcohol Use Not Currently   Comment: rare     Social History   Substance and Sexual Activity  Drug Use No    Social History   Socioeconomic History   Marital status: Single    Spouse name: Not on file   Number of children: Not on file   Years of education: Not on file    Highest education level: Not on file  Occupational History   Not on file  Tobacco Use   Smoking status: Every Day    Packs/day: 1    Types: Cigarettes   Smokeless tobacco: Former  Building services engineer Use: Never used  Substance and Sexual Activity   Alcohol use: Not Currently    Comment: rare   Drug use: No   Sexual activity: Not Currently  Other Topics Concern   Not on file  Social History Narrative   Not on file   Social Determinants of Health   Financial Resource Strain: Not on file  Food Insecurity: Patient Declined (05/01/2022)   Hunger Vital Sign    Worried About Running Out of Food in the Last Year: Patient declined    Ran Out of Food in the Last Year: Patient declined  Transportation Needs: Patient Declined (05/01/2022)   PRAPARE - Administrator, Civil Service (Medical): Patient declined    Lack of Transportation (Non-Medical): Patient declined  Physical Activity: Not on file  Stress: Not on file  Social Connections: Not on file    Sleep: Fair  Appetite:  Fair  Current Medications: Current Facility-Administered Medications  Medication Dose Route Frequency Provider Last Rate Last Admin   benztropine (COGENTIN) tablet 1 mg  1 mg Oral BID Dahlia Byes C, NP   1 mg at 08/20/22 1034   divalproex (DEPAKOTE) DR tablet 500  mg  500 mg Oral Q12H Esli Jernigan C, NP   500 mg at 08/20/22 1034   fluPHENAZine (PROLIXIN) tablet 10 mg  10 mg Oral Daily Dahlia Byes C, NP   10 mg at 08/20/22 1034   mirtazapine (REMERON) tablet 15 mg  15 mg Oral QHS Dahlia Byes C, NP   15 mg at 08/19/22 2104   nicotine polacrilex (NICORETTE) gum 2 mg  2 mg Oral PRN Earney Navy, NP       OLANZapine zydis (ZYPREXA) disintegrating tablet 5 mg  5 mg Oral BID Dahlia Byes C, NP   5 mg at 08/20/22 1033   Current Outpatient Medications  Medication Sig Dispense Refill   fluPHENAZine (PROLIXIN) 10 MG tablet Take 2 tablets (20 mg total) by mouth daily. (Patient  taking differently: Take 10 mg by mouth daily.) 60 tablet 3   hydrOXYzine (ATARAX) 25 MG tablet Take 1 tablet (25 mg total) by mouth 3 (three) times daily as needed for anxiety. 90 tablet 3   mirtazapine (REMERON) 15 MG tablet Take 15 mg by mouth at bedtime.     nicotine polacrilex (NICORETTE) 2 MG gum Take 1 each (2 mg total) by mouth as needed for smoking cessation. 100 tablet 3   benztropine (COGENTIN) 1 MG tablet Take 1 tablet (1 mg total) by mouth 2 (two) times daily. (Patient not taking: Reported on 06/27/2022) 60 tablet 3   cloNIDine (CATAPRES) 0.1 MG tablet TAKE 1 TABLET BY MOUTH 2 TIMES DAILY. (Patient not taking: Reported on 06/27/2022) 180 tablet 1   divalproex (DEPAKOTE) 500 MG DR tablet Take 1 tablet (500 mg total) by mouth every 12 (twelve) hours. (Patient not taking: Reported on 06/27/2022) 60 tablet 3   mirtazapine (REMERON) 30 MG tablet Take 1 tablet (30 mg total) by mouth at bedtime. (Patient not taking: Reported on 08/18/2022) 30 tablet 3    Lab Results:  Results for orders placed or performed during the hospital encounter of 08/18/22 (from the past 48 hour(s))  Urine rapid drug screen (hosp performed)     Status: Abnormal   Collection Time: 08/18/22  5:17 PM  Result Value Ref Range   Opiates NONE DETECTED NONE DETECTED   Cocaine NONE DETECTED NONE DETECTED   Benzodiazepines NONE DETECTED NONE DETECTED   Amphetamines NONE DETECTED NONE DETECTED   Tetrahydrocannabinol POSITIVE (A) NONE DETECTED   Barbiturates NONE DETECTED NONE DETECTED    Comment: (NOTE) DRUG SCREEN FOR MEDICAL PURPOSES ONLY.  IF CONFIRMATION IS NEEDED FOR ANY PURPOSE, NOTIFY LAB WITHIN 5 DAYS.  LOWEST DETECTABLE LIMITS FOR URINE DRUG SCREEN Drug Class                     Cutoff (ng/mL) Amphetamine and metabolites    1000 Barbiturate and metabolites    200 Benzodiazepine                 200 Opiates and metabolites        300 Cocaine and metabolites        300 THC                             50 Performed at [facility], [address], [place], [place] [address]   Comprehensive metabolic panel     Status: None   Collection Time: 08/18/22  6:24 PM  Result Value Ref Range   Sodium 138 135 - 145 mmol/L   Potassium  3.8 3.5 - 5.1 mmol/L   Chloride 101 98 - 111 mmol/L   CO2 24 22 - 32 mmol/L   Glucose, Bld 95 70 - 99 mg/dL    Comment: Glucose reference range applies only to samples taken after fasting for at least 8 hours.   BUN 13 6 - 20 mg/dL   Creatinine, Ser 1.61 0.61 - 1.24 mg/dL   Calcium 9.5 8.9 - 09.6 mg/dL   Total Protein 7.8 6.5 - 8.1 g/dL   Albumin 4.4 3.5 - 5.0 g/dL   AST 31 15 - 41 U/L   ALT 27 0 - 44 U/L   Alkaline Phosphatase 83 38 - 126 U/L   Total Bilirubin 0.5 0.3 - 1.2 mg/dL   GFR, Estimated >04 >54 mL/min    Comment: (NOTE) Calculated using the CKD-EPI Creatinine Equation (2021)    Anion gap 13 5 - 15    Comment: Performed at [facility], [address] [place], [place] [address]  Ethanol     Status: None   Collection Time: 08/18/22  6:24 PM  Result Value Ref Range   Alcohol, Ethyl (B) <10 <10 mg/dL    Comment: (NOTE) Lowest detectable limit for serum alcohol is 10 mg/dL.  For medical purposes only. Performed at [facility], [address] [place], [place] [address]   CBC with Diff     Status: None   Collection Time: 08/18/22  6:24 PM  Result Value Ref Range   WBC 7.1 4.0 - 10.5 K/uL   RBC 5.14 4.22 - 5.81 MIL/uL   Hemoglobin 15.0 13.0 - 17.0 g/dL   HCT 29.5 62.1 - 30.8 %   MCV 84.6 80.0 - 100.0 fL   MCH 29.2 26.0 - 34.0 pg   MCHC 34.5 30.0 - 36.0 g/dL   RDW 65.7 84.6 - 96.2 %   Platelets 226 150 - 400 K/uL   nRBC 0.0 0.0 - 0.2 %   Neutrophils Relative % 75 %   Neutro Abs 5.4 1.7 - 7.7 K/uL   Lymphocytes Relative 14 %   Lymphs Abs 1.0 0.7 - 4.0 K/uL   Monocytes Relative 8 %   Monocytes Absolute 0.6 0.1 - 1.0 K/uL   Eosinophils Relative 1 %   Eosinophils Absolute 0.1 0.0  - 0.5 K/uL   Basophils Relative 1 %   Basophils Absolute 0.0 0.0 - 0.1 K/uL   Immature Granulocytes 1 %   Abs Immature Granulocytes 0.04 0.00 - 0.07 K/uL    Comment: Performed at [facility], [address], [place], [place] [address]    Blood Alcohol level:  Lab Results  Component Value Date   ETH <10 08/18/2022   ETH <10 06/21/2022    Physical Findings:  CIWA:    COWS:     Musculoskeletal: Strength & Muscle Tone: within normal limits Gait & Station: normal Patient leans: Front  Psychiatric Specialty Exam:  Presentation  General Appearance:  Casual; Neat  Eye Contact: Good  Speech: Clear and Coherent; Normal Rate  Speech Volume: Normal  Handedness: Right   Mood and Affect  Mood: Anxious  Affect: Congruent   Thought Process  Thought Processes: Disorganized  Descriptions of Associations:Tangential  Orientation:Partial  Thought Content:Logical  History of Schizophrenia/Schizoaffective disorder:Yes  Duration of Psychotic Symptoms:Greater than six months  Hallucinations:Hallucinations: None  Ideas of Reference:None  Suicidal Thoughts:Suicidal Thoughts: No  Homicidal Thoughts:Homicidal Thoughts: No   Sensorium  Memory: Immediate Good; Recent Good; Remote Good  Judgment: Impaired  Insight: Lacking   Executive Functions  Concentration: Poor  Attention Span: Poor  Recall: Poor  Fund of Knowledge: Poor  Language: Poor   Psychomotor Activity  Psychomotor Activity: Psychomotor Activity: Normal   Assets  Assets: Manufacturing systems engineer; Housing; Desire for Improvement; Physical Health; Social Support   Sleep  Sleep: Sleep: Good    Physical Exam: Physical Exam Vitals and nursing note reviewed.  Constitutional:      Appearance: Normal appearance.  HENT:     Head: Normocephalic and atraumatic.     Nose: Nose normal.  Cardiovascular:     Rate and Rhythm: Normal rate and regular rhythm.   Pulmonary:     Effort: Pulmonary effort is normal.  Musculoskeletal:        General: Normal range of motion.     Cervical back: Normal range of motion.  Skin:    General: Skin is warm and dry.  Neurological:     General: No focal deficit present.     Mental Status: He is alert and oriented to person, place, and time.  Psychiatric:        Attention and Perception: Attention normal.        Mood and Affect: Mood is anxious and depressed.        Speech: Speech normal.        Behavior: Behavior normal. Behavior is cooperative.        Thought Content: Thought content normal.        Cognition and Memory: Cognition is impaired. Memory is impaired.        Judgment: Judgment is impulsive.    Review of Systems  Constitutional: Negative.   HENT: Negative.    Eyes: Negative.   Respiratory: Negative.    Cardiovascular: Negative.   Gastrointestinal: Negative.   Genitourinary: Negative.   Musculoskeletal: Negative.   Skin: Negative.   Neurological: Negative.   Endo/Heme/Allergies: Negative.   Psychiatric/Behavioral:  Positive for hallucinations and substance abuse. The patient is nervous/anxious.    Blood pressure 100/73, pulse 66, temperature 98.3 F (36.8 C), temperature source Oral, resp. rate 16, weight 68 kg, SpO2 99 %. Body mass index is 24.2 kg/m.   Medical Decision Making: Patient has been sleeping off and on and is much calmer today.  He also has been accepted for treatment at [facility] in [place] and will be transported tomorrow.  Patient is compliant with his medications.  Plan is to move him to a substance abuse treatment facility after treatment at [facility] according to his father.  Problem 1: Disorganized Schizophrenia  Problem 2: Substance induced Psychosis  Earney Navy, NP-PMHNP-BC 08/20/2022, 3:16 PM

## 2022-08-20 NOTE — ED Notes (Signed)
Pt requesting something for sleep will notify provider .

## 2022-08-20 NOTE — ED Provider Notes (Signed)
Emergency Medicine Observation Re-evaluation Note  Brendan Hogan is a 27 y.o. male, seen on rounds today.  Pt initially presented to the ED for complaints of Psychiatric Evaluation Currently, the patient is resting.  Physical Exam  BP 100/73 (BP Location: Right Arm)   Pulse 66   Temp 98.3 F (36.8 C) (Oral)   Resp 16   Wt 68 kg   SpO2 99%   BMI 24.20 kg/m  Physical Exam General: NAD  ED Course / MDM  EKG:EKG Interpretation  Date/Time:  Sunday August 18 2022 18:39:59 EDT Ventricular Rate:  114 PR Interval:  130 QRS Duration: 80 QT Interval:  320 QTC Calculation: 441 R Axis:   -2 Text Interpretation: Sinus tachycardia Minimal voltage criteria for LVH, may be normal variant ( R in aVL ) Nonspecific T wave abnormality Abnormal ECG No significant change since last tracing Confirmed by Melene Plan [phone]) on 08/18/2022 6:51:14 PM  I have reviewed the labs performed to date as well as medications administered while in observation.  Recent changes in the last 24 hours include no acute events reported.  Plan  Current plan is for transfer today to [facility], Dr. Weyman Croon accepting.    Wynetta Fines, MD 08/20/22 [phone]

## 2022-08-20 NOTE — ED Notes (Signed)
Pt is currently sleeping with no signs of distress or discomfort skin color appropriate for his ethnicity.

## 2022-08-21 LAB — RESP PANEL BY RT-PCR (RSV, FLU A&B, COVID)  RVPGX2
Influenza A by PCR: NEGATIVE
Influenza B by PCR: NEGATIVE
Resp Syncytial Virus by PCR: NEGATIVE
SARS Coronavirus 2 by RT PCR: NEGATIVE

## 2022-08-21 MED ORDER — ACETAMINOPHEN 325 MG PO TABS
650.0000 mg | ORAL_TABLET | Freq: Once | ORAL | Status: AC
  Administered 2022-08-21: 650 mg via ORAL
  Filled 2022-08-21: qty 2

## 2022-08-21 NOTE — ED Provider Notes (Signed)
Emergency Medicine Observation Re-evaluation Note  Brendan Hogan is a 27 y.o. male, seen on rounds today.  Pt initially presented to the ED for complaints of Psychiatric Evaluation Currently, the patient is sleeping. 27 year old male with known schizophrenia and presented with acute psychosis. Patient is IVC Physical Exam  BP 99/77 (BP Location: Right Arm)   Pulse 84   Temp 97.9 F (36.6 C) (Oral)   Resp 16   Wt 68 kg   SpO2 98%   BMI 24.20 kg/m  Physical Exam General: nad Cardiac: hr 84 Lungs: no distress normal sats Psych: patient currently sleeping  ED Course / MDM  EKG:EKG Interpretation  Date/Time:  Sunday August 18 2022 18:39:59 EDT Ventricular Rate:  114 PR Interval:  130 QRS Duration: 80 QT Interval:  320 QTC Calculation: 441 R Axis:   -2 Text Interpretation: Sinus tachycardia Minimal voltage criteria for LVH, may be normal variant ( R in aVL ) Nonspecific T wave abnormality Abnormal ECG No significant change since last tracing Confirmed by Melene Plan [phone]) on 08/18/2022 6:51:14 PM  I have reviewed the labs performed to date as well as medications administered while in observation.  Recent changes in the last 24 hours include none.  Plan  Current plan is for patient ivc'd with plan for admission to [facility] in [place] today.Margarita Grizzle, MD 08/21/22 [phone]

## 2022-08-21 NOTE — ED Notes (Signed)
Police officer here to transport patient.

## 2022-08-21 NOTE — ED Notes (Signed)
Sheriff transport called d/t IVC

## 2022-08-21 NOTE — ED Notes (Signed)
OOB to BR gait steady atarax given  for sleep effective pt return to bed a sleep after using BR.

## 2022-12-08 ENCOUNTER — Ambulatory Visit (HOSPITAL_COMMUNITY)
Admission: EM | Admit: 2022-12-08 | Discharge: 2022-12-11 | Disposition: A | Discharge status: Other Acute Inpatient Hospital | Payer: PRIVATE HEALTH INSURANCE | Attending: Family | Admitting: Family

## 2022-12-08 ENCOUNTER — Other Ambulatory Visit: Payer: Self-pay

## 2022-12-08 DIAGNOSIS — T426X6A Underdosing of other antiepileptic and sedative-hypnotic drugs, initial encounter: Secondary | ICD-10-CM | POA: Insufficient documentation

## 2022-12-08 DIAGNOSIS — F201 Disorganized schizophrenia: Secondary | ICD-10-CM | POA: Insufficient documentation

## 2022-12-08 DIAGNOSIS — R9431 Abnormal electrocardiogram [ECG] [EKG]: Secondary | ICD-10-CM | POA: Insufficient documentation

## 2022-12-08 DIAGNOSIS — T433X6A Underdosing of phenothiazine antipsychotics and neuroleptics, initial encounter: Secondary | ICD-10-CM | POA: Insufficient documentation

## 2022-12-08 DIAGNOSIS — Z91128 Patient's intentional underdosing of medication regimen for other reason: Secondary | ICD-10-CM | POA: Insufficient documentation

## 2022-12-08 MED ORDER — NICOTINE 14 MG/24HR TD PT24
14.0000 mg | MEDICATED_PATCH | Freq: Every day | TRANSDERMAL | Status: DC
  Filled 2022-12-08 (×2): qty 1

## 2022-12-08 MED ORDER — FLUPHENAZINE HCL 5 MG PO TABS
5.0000 mg | ORAL_TABLET | Freq: Every day | ORAL | Status: DC
  Administered 2022-12-08 – 2022-12-10 (×3): 5 mg via ORAL
  Filled 2022-12-08 (×3): qty 1

## 2022-12-08 MED ORDER — LORAZEPAM 1 MG PO TABS: 1.0000 mg | ORAL_TABLET | ORAL | Status: DC | PRN

## 2022-12-08 MED ORDER — OLANZAPINE 10 MG PO TBDP
10.0000 mg | ORAL_TABLET | Freq: Three times a day (TID) | ORAL | Status: DC | PRN
  Filled 2022-12-08: qty 1

## 2022-12-08 MED ORDER — LORAZEPAM 1 MG PO TABS
2.0000 mg | ORAL_TABLET | Freq: Once | ORAL | Status: DC
  Filled 2022-12-08: qty 2

## 2022-12-08 MED ORDER — ALUM & MAG HYDROXIDE-SIMETH 200-200-20 MG/5ML PO SUSP: 30.0000 mL | ORAL | Status: DC | PRN

## 2022-12-08 MED ORDER — MAGNESIUM HYDROXIDE 400 MG/5ML PO SUSP: 30.0000 mL | Freq: Every day | ORAL | Status: DC | PRN

## 2022-12-08 MED ORDER — ACETAMINOPHEN 325 MG PO TABS: 650.0000 mg | ORAL_TABLET | Freq: Four times a day (QID) | ORAL | Status: DC | PRN

## 2022-12-08 MED ORDER — HYDROXYZINE HCL 25 MG PO TABS
25.0000 mg | ORAL_TABLET | Freq: Three times a day (TID) | ORAL | Status: DC | PRN
  Administered 2022-12-08 – 2022-12-10 (×2): 25 mg via ORAL
  Filled 2022-12-08 (×3): qty 1

## 2022-12-08 MED ORDER — TRAZODONE HCL 50 MG PO TABS
50.0000 mg | ORAL_TABLET | Freq: Every evening | ORAL | Status: DC | PRN
  Administered 2022-12-08 – 2022-12-10 (×3): 50 mg via ORAL
  Filled 2022-12-08 (×4): qty 1

## 2022-12-08 MED ORDER — DIVALPROEX SODIUM 500 MG PO DR TAB
500.0000 mg | DELAYED_RELEASE_TABLET | Freq: Two times a day (BID) | ORAL | Status: DC
  Administered 2022-12-08 – 2022-12-11 (×6): 500 mg via ORAL
  Filled 2022-12-08 (×7): qty 1

## 2022-12-08 NOTE — ED Notes (Signed)
Patient refused blood work and EKG but did come on the unit into the flex area without incident. Pt agitated but is not aggressive physically or verbally at this time. Pt did mention he has a pending assault charge against a police officer/security guard. Pt verbalizes understanding about being here for 24 hours. Denies SI/HI/AVH. At this time does not appear to be responding to internal stimuli. He was offered food and water but declines at this time.

## 2022-12-08 NOTE — Progress Notes (Signed)
   12/08/22 0932  [facility] Triage Screening (Walk-ins at [facility] only)  How Did You Hear About [place]? Legal System  What Is the Reason for Your Visit/Call Today? Pt arrived to [facility] accomplanied by [name] under IVC orders. Pt states that he is fine and doesn't need to be here. Pt also states that he hasn't been sleeping but he doesn't really need any sleep. Pt denies SI/HI and AVH at this current moment. Pt denies the use of alcohol and substance abuse at this current moment. Per IVC order the pt is diagnosed with schizphrenia and takes the following medications (fluphnazine, hydroxyzine, and mirtazapine).  How Long Has This Been Causing You Problems? <Week  Have You Recently Had Any Thoughts About Hurting Yourself? No  Are You Planning to Commit Suicide/Harm Yourself At This time? No  Have you Recently Had Thoughts About Hurting Someone [name]? No  Are You Planning To Harm Someone At This Time? No  Are you currently experiencing any auditory, visual or other hallucinations? No  Have You Used Any Alcohol or Drugs in the Past 24 Hours? No  Do you have any current medical co-morbidities that require immediate attention? No  What Do You Feel Would Help You the Most Today? Treatment for Depression or other mood problem  If access to [facility] was not available, would you have sought care in the Emergency Department? Yes  Determination of Need Urgent (48 hours)  Options For Referral Inpatient Hospitalization

## 2022-12-08 NOTE — ED Notes (Signed)
Pt is currently sleeping, no distress noted, environmental check complete, will continue to monitor patient for safety.  

## 2022-12-08 NOTE — BH Assessment (Signed)
Comprehensive Clinical Assessment (CCA) Note  12/08/2022 [facility] [id number] DISPOSITIONMelvyn Neth Hogan recommends an inpatient admission to assist with stabilization.   The patient demonstrates the following risk factors for suicide: Chronic risk factors for suicide include: N/A. Acute risk factors for suicide include: N/A. Protective factors for this patient include: coping skills. Considering these factors, the overall suicide risk at this point appears to be low. Patient is not appropriate for outpatient follow up.   Patient is a 27 year old male that presents this date to [facility] by [name] with IVC. Patient denies any S/I, H/I or AVH. Patient is observed to be guarded, agitated and renders limited history on arrival. Patient denies content of IVC. Patient is unable to have a meaningful conversation and is refusing to participate in the assessment also refusing skin assessment and labs. Per IVC that was initiated by family member and guardian B[facility] [phone], "Respondent has a history of Schizophrenia, is aggressive, doesn't care if someone kills him and is biting his fingers and feet." Patient as noted is unwilling to participate in the assessment and is observed to be agitated. Information to complete assessment is obtained from admission notes, collateral and previous history.    Patient has a history significant for Schizophrenia and receives [facility] from [facility] and also has been seen by [facility] at [facility]. (See MAR) Per collateral this date from father patient with whom patient resides, reports that patient has been non-compliant with his medications for over three months. Per father patient has been aggressive with family members although has not assaulted any members of the household. Patient does have upcoming charges associated with aggressive behaviors and destruction of property. First appearance for court is on 8/14.   Patient does not have access to weapons in the  household. Patient denies any SA issues this date although per chart review patient has a history of THC/CBD use. UDS pending this date. Patient also has a history of previous admissions when he has presented with similar symptoms. Per chart patient was seen on 4/21 when he presented to [facility] with acute psychosis having active visual hallucinations. To note patient would not participate in the assessment at that time also due to AMS. Patient did require an inpatient admission at that time.   Patient this date is observed to be agitated, suspicious, argumentative and oriented x 3. Patient renders limited history and speaks in a pressured voice in an aggressive tone. Patient's memory appears to be intact with thoughts disorganized. Patients mood is angry with affect congruent. Patient does not appear to be responding to internal stimuli.         Chief Complaint:  Chief Complaint  Patient presents with   Schizophrenia   Visit Diagnosis: Schizophrenia     CCA Screening, Triage and Referral (STR)  Patient Reported Information How did you hear about [place]? Legal System  What Is the Reason for Your Visit/Call Today? Pt arrived to [facility] accomplanied by [name] under IVC orders. Pt states that he is fine and doesn't need to be here. Pt also states that he hasn't been sleeping but he doesn't really need any sleep. Pt denies SI/HI and AVH at this current moment. Pt denies the use of alcohol and substance abuse at this current moment. Per IVC order the pt is diagnosed with schizphrenia and takes the following medications (fluphnazine, hydroxyzine, and mirtazapine).  How Long Has This Been Causing You Problems? > than 6 months  What Do You Feel Would  Help You the Most Today? Treatment for Depression or other mood problem   Have You Recently Had Any Thoughts About Hurting Yourself? No  Are You Planning to Commit Suicide/Harm Yourself At This time? No   Flowsheet Row ED from 12/08/2022 in [facility] from 08/18/2022 in [facility] from 06/21/2022 in [facility]  C-SSRS RISK CATEGORY No Risk No Risk No Risk       Have you Recently Had Thoughts About Hurting Someone [facility]? No  Are You Planning to Harm Someone at This Time? No  Explanation: NA   Have You Used Any Alcohol or Drugs in the Past 24 Hours? No  What Did You Use and How Much? NA   Do You Currently Have a Therapist/Psychiatrist? Yes  Name of Therapist/Psychiatrist: Name of Therapist/Psychiatrist: Doyne Keel Hogan for medication management   Have You Been Recently Discharged From Any Office Practice or Programs? No  Explanation of Discharge From Practice/Program: NA     CCA Screening Triage Referral Assessment Type of Contact: Face-to-Face  Telemedicine Service Delivery:   Is this Initial or Reassessment?   Date Telepsych consult ordered in [facility]:    Time Telepsych consult ordered in [facility]:    Location of Assessment: [facility]  Provider Location: [facility]   Collateral Involvement: Father Brendan Hogan assists with collateral information   Does Patient Have a Court Appointed Legal Guardian? No Father  Legal Guardian Contact Information: [facility] [phone]  Copy of Legal Guardianship Form: No - copy requested  Legal Guardian Notified of Arrival: Successfully notified  Legal Guardian Notified of Pending Discharge: -- (NA)  If Minor and Not Living with Parent(s), Who has Custody? NA  Is CPS involved or ever been involved? Never  Is APS involved or ever been involved? Never   Patient Determined To Be At Risk for Harm To Self or Others Based on Review of Patient Reported Information or Presenting Complaint? No  Method: No Plan  Availability of Means: No access or NA  Intent: Vague intent or NA  Notification Required: No need or identified  person  Additional Information for Danger to Others Potential: -- (NA)  Additional Comments for Danger to Others Potential: NA  Are There Guns or Other Weapons in Your Home? No  Types of Guns/Weapons: NA  Are These Weapons Safely Secured?                            -- (NA)  Who Could Verify You Are Able To Have These Secured: NA  Do You Have any Outstanding Charges, Pending Court Dates, Parole/Probation? Patient reports he has upcoming assault charges with court date on 8/14  Contacted To Inform of Risk of Harm To Self or Others: Other: Comment (NA)    Does Patient Present under Involuntary Commitment? Yes    [place] of Residence: [place]   Patient Currently Receiving the Following Hogan: Medication Management   Determination of Need: Emergent (2 hours)   Options For Referral: Inpatient Hospitalization     CCA Biopsychosocial Patient Reported Schizophrenia/Schizoaffective Diagnosis in Past: Yes   Strengths: UTA due to AMS   Mental Health Symptoms Depression:   Change in energy/activity; Difficulty Concentrating; Sleep (too much or little)   Duration of Depressive symptoms:  Duration of Depressive Symptoms: Greater than two weeks   Mania:   Change  in energy/activity   Anxiety:    Restlessness; Irritability; Difficulty concentrating   Psychosis:   Affective flattening/alogia/avolition   Duration of Psychotic symptoms:  Duration of Psychotic Symptoms: Less than six months   Trauma:   None   Obsessions:   None   Compulsions:   None   Inattention:   N/A   Hyperactivity/Impulsivity:   N/A   Oppositional/Defiant Behaviors:   N/A   Emotional Irregularity:   Mood lability   Other Mood/Personality Symptoms:   NA    Mental Status Exam Appearance and self-care  Stature:   Average   Weight:   Average weight   Clothing:   Casual   Grooming:   Normal   Cosmetic use:   None   Posture/gait:   Normal   Motor activity:    Not Remarkable   Sensorium  Attention:   Inattentive; Distractible; Vigilant   Concentration:   Variable   Orientation:   Person; Place; Situation   Recall/memory:   Defective in Recent   Affect and Mood  Affect:   Anxious   Mood:   Anxious; Irritable; Angry   Relating  Eye contact:   Fleeting   Facial expression:   Constricted   Attitude toward examiner:   Uninterested; Defensive; Hostile   Thought and Language  Speech flow:  Pressured   Thought content:   Appropriate to Mood and Circumstances   Preoccupation:   None   Hallucinations:   None   Organization:   Insurance underwriter of Knowledge:   Average   Intelligence:   Average   Abstraction:   Normal   Judgement:   Poor   Reality Testing:   Variable   Insight:   Lacking   Decision Making:   Vacilates   Social Functioning  Social Maturity:   Irresponsible   Social Judgement:   Normal   Stress  Stressors:   Family conflict   Coping Ability:   Human resources officer Deficits:   Responsibility   Supports:   Family     Religion: Religion/Spirituality Are You A Religious Person?: No How Might This Affect Treatment?: NA  Leisure/Recreation: Leisure / Recreation Do You Have Hobbies?: No Leisure and Hobbies: Pt renders limited hx  Exercise/Diet: Exercise/Diet Do You Exercise?: No Have You Gained or Lost A Significant Amount of Weight in the Past Six Months?: No Do You Follow a Special Diet?: No Type of Diet: NA Do You Have Any Trouble Sleeping?: No   CCA Employment/Education Employment/Work Situation: Employment / Work Situation Employment Situation: Unemployed Patient's Job has Been Impacted by Current Illness: No Has Patient ever Been in Equities trader?: No  Education: Education Is Patient Currently Attending School?: No Last Grade Completed: 12 (some college) Did Theme park manager?: Yes What Type of College Degree Do you Have?: Per  chart review "some college" Did You Have An Individualized Education Program (IIEP): No Did You Have Any Difficulty At School?: No Patient's Education Has Been Impacted by Current Illness: No   CCA Family/Childhood History Family and Relationship History: Family history Marital status: Single Does patient have children?: No  Childhood History:  Childhood History By whom was/is the patient raised?: Both parents Did patient suffer any verbal/emotional/physical/sexual abuse as a child?: No Did patient suffer from severe childhood neglect?: No Has patient ever been sexually abused/assaulted/raped as an adolescent or adult?: No Was the patient ever a victim of a crime or a disaster?: No Witnessed domestic violence?: No Has patient  been affected by domestic violence as an adult?: No       CCA Substance Use Alcohol/Drug Use: Alcohol / Drug Use Pain Medications: see MAR Prescriptions: see MAR Over the Counter: see MAR History of alcohol / drug use?: Yes (Per chart review) Longest period of sobriety (when/how long): UTA Negative Consequences of Use:  (UTA) Withdrawal Symptoms: None Substance #1 Name of Substance 1: CBD/THC per chart review patient denies use this date 1 - Age of First Use: UTA 1 - Amount (size/oz): UTA 1 - Frequency: UTA 1 - Duration: UTA 1 - Last Use / Amount: UTA with UDS pending this date 1 - Method of Aquiring: UTA 1- Route of Use: UTA                       ASAM's:  Six Dimensions of Multidimensional Assessment  Dimension 1:  Acute Intoxication and/or Withdrawal Potential:   Dimension 1:  Description of individual's past and current experiences of substance use and withdrawal: NA  Dimension 2:  Biomedical Conditions and Complications:   Dimension 2:  Description of patient's biomedical conditions and  complications: NA  Dimension 3:  Emotional, Behavioral, or Cognitive Conditions and Complications:  Dimension 3:  Description of emotional,  behavioral, or cognitive conditions and complications: NA  Dimension 4:  Readiness to Change:  Dimension 4:  Description of Readiness to Change criteria: NA  Dimension 5:  Relapse, Continued use, or Continued Problem Potential:  Dimension 5:  Relapse, continued use, or continued problem potential critiera description: NA  Dimension 6:  Recovery/Living Environment:  Dimension 6:  Recovery/Iiving environment criteria description: NA  ASAM Severity Score:    ASAM Recommended Level of Treatment: ASAM Recommended Level of Treatment:  (UTA)   Substance use Disorder (SUD) Substance Use Disorder (SUD)  Checklist Symptoms of Substance Use:  (UTA)  Recommendations for Hogan/Supports/Treatments: Recommendations for Hogan/Supports/Treatments Recommendations For Hogan/Supports/Treatments:  (UTA)  Discharge Disposition:    DSM5 Diagnoses: Patient Active Problem List   Diagnosis Date Noted   Nocturnal enuresis 07/11/2022   Primary insomnia 11/04/2019   

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
## 2022-12-08 NOTE — ED Notes (Signed)
Patient has been resting in the bed for the shift but continues to decline allowing [place] to draw blood, complete the EKG or take medications but patient has been calm. No distress noted. At this time does not appear to be responding to internal stimuli. Denies SI/HI/AVH.

## 2022-12-08 NOTE — ED Notes (Signed)
Patient is currently lying in bed quietly, no distress noted, will continue to monitor patient for safety.  

## 2022-12-08 NOTE — Progress Notes (Signed)
LCSW Progress Note  [id number]   DENNIES SIKKEMA  12/08/2022  12:10 PM    Inpatient Behavioral Health Placement  Pt meets inpatient criteria per Hillery Jacks, NP. There are no available beds within [facility] system per [facility] AC Linsey Strader,RN. Referral was sent to the following facilities;   Destination  Service Provider Address Phone [facility] [place]  [address] [place], [place] [address] [phone]  [facility]  [address], [place] [address] [phone]  [facility]  [address] [place] [address] [phone]  [facility]  [address] [place] [address] [phone]  [facility]  [address] [place] [address] [phone]  [facility]  [address], [place] [address] [phone]  [facility]  [address], [place] [address] [phone]  [facility] Inpatient [facility]  [place] [phone]  [facility]  [address] [place] [address] [phone]  [facility]  [address] [place] [address] [phone]  [facility] [place] [address] [phone]  [facility]  [address] [place] [address] [phone]  [facility] [address]  [address], [place] [address] [phone]  [facility]  [address] [place], [place] [address] [phone]  [facility]  [address] [place] [address] [phone]  [facility]  [address], [place] [address] [phone]  [facility]  [address] [place] [address] [phone]  [facility]  [address] [place] [address] [phone]  [facility]  [address], [place] [address] [phone]  [facility]  [place] [phone]  Renella Cunas  [place] -- [phone]  [facility]  [address] [place] [address] [phone]  [facility]  [address], [place] [address] [phone]  [facility]  [address] [place] [address] [phone]    Situation ongoing,  CSW will follow up.    Maryjean Ka, MSW, LCSWA 12/08/2022 12:10 PM

## 2022-12-09 DIAGNOSIS — Z91128 Patient's intentional underdosing of medication regimen for other reason: Secondary | ICD-10-CM | POA: Diagnosis not present

## 2022-12-09 DIAGNOSIS — T433X6A Underdosing of phenothiazine antipsychotics and neuroleptics, initial encounter: Secondary | ICD-10-CM | POA: Diagnosis not present

## 2022-12-09 DIAGNOSIS — T426X6A Underdosing of other antiepileptic and sedative-hypnotic drugs, initial encounter: Secondary | ICD-10-CM | POA: Diagnosis not present

## 2022-12-09 DIAGNOSIS — R9431 Abnormal electrocardiogram [ECG] [EKG]: Secondary | ICD-10-CM | POA: Diagnosis not present

## 2022-12-09 DIAGNOSIS — F209 Schizophrenia, unspecified: Secondary | ICD-10-CM | POA: Diagnosis present

## 2022-12-09 DIAGNOSIS — F201 Disorganized schizophrenia: Secondary | ICD-10-CM | POA: Diagnosis not present

## 2022-12-09 DIAGNOSIS — R456 Violent behavior: Secondary | ICD-10-CM | POA: Diagnosis present

## 2022-12-09 NOTE — ED Notes (Signed)
Patient refused to eat or drink anything all day. We continuously encouraged patient to eat and drink throughout the day.

## 2022-12-09 NOTE — ED Provider Notes (Signed)
Behavioral Health Progress Note  Date and Time: 12/09/2022 4:51 PM Name: Brendan Hogan MRN:  [id number]  Subjective:  ***  Diagnosis:  Final diagnoses:  Disorganized schizophrenia (HCC)    Total Time spent with patient: {Time; 15 min - 8 hours:17441}  Past Psychiatric History: *** Past Medical History: *** Family History: *** Family Psychiatric  History: *** Social History: ***  Additional Social History:    Pain Medications: see MAR Prescriptions: see MAR Over the Counter: see MAR History of alcohol / drug use?: Yes (Per chart review) Longest period of sobriety (when/how long): UTA Negative Consequences of Use:  (UTA) Withdrawal Symptoms: None Name of Substance 1: CBD/THC per chart review patient denies use this date 1 - Age of First Use: UTA 1 - Amount (size/oz): UTA 1 - Frequency: UTA 1 - Duration: UTA 1 - Last Use / Amount: UTA with UDS pending this date 1 - Method of Aquiring: UTA 1- Route of Use: UTA                  Sleep: {BHH GOOD/FAIR/POOR:22877}  Appetite:  {BHH GOOD/FAIR/POOR:22877}  Current Medications:  Current Facility-Administered Medications  Medication Dose Route Frequency Provider Last Rate Last Admin   acetaminophen (TYLENOL) tablet 650 mg  650 mg Oral Q6H PRN Oneta Rack, NP       alum & mag hydroxide-simeth (MAALOX/MYLANTA) 200-200-20 MG/5ML suspension 30 mL  30 mL Oral Q4H PRN Oneta Rack, NP       divalproex (DEPAKOTE) DR tablet 500 mg  500 mg Oral Q12H Oneta Rack, NP   500 mg at 12/09/22 1016   fluPHENAZine (PROLIXIN) tablet 5 mg  5 mg Oral QHS Oneta Rack, NP   5 mg at 12/08/22 2208   hydrOXYzine (ATARAX) tablet 25 mg  25 mg Oral TID PRN Oneta Rack, NP   25 mg at 12/08/22 2207   OLANZapine zydis (ZYPREXA) disintegrating tablet 10 mg  10 mg Oral Q8H PRN Oneta Rack, NP       And   LORazepam (ATIVAN) tablet 1 mg  1 mg Oral PRN Oneta Rack, NP       LORazepam (ATIVAN) tablet 2 mg  2 mg Oral Once Oneta Rack, NP       magnesium hydroxide (MILK OF MAGNESIA) suspension 30 mL  30 mL Oral Daily PRN Oneta Rack, NP       nicotine (NICODERM CQ - dosed in mg/24 hours) patch 14 mg  14 mg Transdermal Daily Oneta Rack, NP       traZODone (DESYREL) tablet 50 mg  50 mg Oral QHS PRN Oneta Rack, NP   50 mg at 12/08/22 2207   No current outpatient medications on file.    Labs  Lab Results:  Admission on 12/08/2022  Component Date Value Ref Range Status   WBC 12/09/2022 4.2  4.0 - 10.5 K/uL Final   RBC 12/09/2022 5.37  4.22 - 5.81 MIL/uL Final   Hemoglobin 12/09/2022 15.1  13.0 - 17.0 g/dL Final   HCT 78/29/5621 45.8  39.0 - 52.0 % Final   MCV 12/09/2022 85.3  80.0 - 100.0 fL Final   MCH 12/09/2022 28.1  26.0 - 34.0 pg Final   MCHC 12/09/2022 33.0  30.0 - 36.0 g/dL Final   RDW 30/86/5784 13.0  11.5 - 15.5 % Final   Platelets 12/09/2022 137 (L)  150 - 400 K/uL Final   PLATELET COUNT CONFIRMED BY SMEAR  nRBC 12/09/2022 0.0  0.0 - 0.2 % Final   Neutrophils Relative % 12/09/2022 44  % Final   Neutro Abs 12/09/2022 1.9  1.7 - 7.7 K/uL Final   Lymphocytes Relative 12/09/2022 34  % Final   Lymphs Abs 12/09/2022 1.4  0.7 - 4.0 K/uL Final   Monocytes Relative 12/09/2022 10  % Final   Monocytes Absolute 12/09/2022 0.4  0.1 - 1.0 K/uL Final   Eosinophils Relative 12/09/2022 11  % Final   Eosinophils Absolute 12/09/2022 0.4  0.0 - 0.5 K/uL Final   Basophils Relative 12/09/2022 1  % Final   Basophils Absolute 12/09/2022 0.0  0.0 - 0.1 K/uL Final   Immature Granulocytes 12/09/2022 0  % Final   Abs Immature Granulocytes 12/09/2022 0.01  0.00 - 0.07 K/uL Final   Performed at [facility], [address], [place], [place] [address]   Sodium 12/09/2022 141  135 - 145 mmol/L Final   Potassium 12/09/2022 3.7  3.5 - 5.1 mmol/L Final   Chloride 12/09/2022 102  98 - 111 mmol/L Final   CO2 12/09/2022 25  22 - 32 mmol/L Final   Glucose, Bld 12/09/2022 82  70 - 99 mg/dL Final   Glucose  reference range applies only to samples taken after fasting for at least 8 hours.   BUN 12/09/2022 9  6 - 20 mg/dL Final   Creatinine, Ser 12/09/2022 0.91  0.61 - 1.24 mg/dL Final   Calcium 81/19/1478 9.5  8.9 - 10.3 mg/dL Final   Total Protein 29/56/2130 6.7  6.5 - 8.1 g/dL Final   Albumin 86/57/8469 3.9  3.5 - 5.0 g/dL Final   AST 62/95/2841 94 (H)  15 - 41 U/L Final   ALT 12/09/2022 59 (H)  0 - 44 U/L Final   Alkaline Phosphatase 12/09/2022 83  38 - 126 U/L Final   Total Bilirubin 12/09/2022 1.0  0.3 - 1.2 mg/dL Final   GFR, Estimated 12/09/2022 >60  >60 mL/min Final   Comment: (NOTE) Calculated using the CKD-EPI Creatinine Equation (2021)    Anion gap 12/09/2022 14  5 - 15 Final   Performed at [facility], [address] [place], [place] [address]   Cholesterol 12/09/2022 150  0 - 200 mg/dL Final   Triglycerides 02/23/2535 95  <150 mg/dL Final   HDL 64/40/3474 40 (L)  >40 mg/dL Final   Total CHOL/HDL Ratio 12/09/2022 3.8  RATIO Final   VLDL 12/09/2022 19  0 - 40 mg/dL Final   LDL Cholesterol 12/09/2022 91  0 - 99 mg/dL Final   Comment:        Total Cholesterol/HDL:CHD Risk Coronary Heart Disease Risk Table                     Men   Women  1/2 Average Risk   3.4   3.3  Average Risk       5.0   4.4  2 X Average Risk   9.6   7.1  3 X Average Risk  23.4   11.0        Use the calculated Patient Ratio above and the CHD Risk Table to determine the patient's CHD Risk.        ATP III CLASSIFICATION (LDL):  <100     mg/dL   Optimal  259-563  mg/dL   Near or Above  Optimal  130-159  mg/dL   Borderline  161-096  mg/dL   High  >045     mg/dL   Very High Performed at [facility], [address], [place], [place] [address]    TSH 12/09/2022 0.775  0.350 - 4.500 uIU/mL Final   Comment: Performed by a 3rd Generation assay with a functional sensitivity of <=0.01 uIU/mL. Performed at [facility], [address] [place], [place] [address]     POC Amphetamine UR 12/09/2022 None Detected  NONE DETECTED (Cut Off Level 1000 ng/mL) Final   POC Secobarbital (BAR) 12/09/2022 None Detected  NONE DETECTED (Cut Off Level 300 ng/mL) Final   POC Buprenorphine (BUP) 12/09/2022 None Detected  NONE DETECTED (Cut Off Level 10 ng/mL) Final   POC Oxazepam (BZO) 12/09/2022 None Detected  NONE DETECTED (Cut Off Level 300 ng/mL) Final   POC Cocaine UR 12/09/2022 None Detected  NONE DETECTED (Cut Off Level 300 ng/mL) Final   POC Methamphetamine UR 12/09/2022 None Detected  NONE DETECTED (Cut Off Level 1000 ng/mL) Final   POC Morphine 12/09/2022 None Detected  NONE DETECTED (Cut Off Level 300 ng/mL) Final   POC Methadone UR 12/09/2022 None Detected  NONE DETECTED (Cut Off Level 300 ng/mL) Final   POC Oxycodone UR 12/09/2022 None Detected  NONE DETECTED (Cut Off Level 100 ng/mL) Final   POC Marijuana UR 12/09/2022 Positive (A)  NONE DETECTED (Cut Off Level 50 ng/mL) Final   Valproic Acid Lvl 12/09/2022 35 (L)  50.0 - 100.0 ug/mL Final   Performed at [facility], [address], [place], [place] [address]  Admission on 08/18/2022, Discharged on 08/21/2022  Component Date Value Ref Range Status   Sodium 08/18/2022 138  135 - 145 mmol/L Final   Potassium 08/18/2022 3.8  3.5 - 5.1 mmol/L Final   Chloride 08/18/2022 101  98 - 111 mmol/L Final   CO2 08/18/2022 24  22 - 32 mmol/L Final   Glucose, Bld 08/18/2022 95  70 - 99 mg/dL Final   Glucose reference range applies only to samples taken after fasting for at least 8 hours.   BUN 08/18/2022 13  6 - 20 mg/dL Final   Creatinine, Ser 08/18/2022 0.94  0.61 - 1.24 mg/dL Final   Calcium 21/30/8657 9.5  8.9 - 10.3 mg/dL Final   Total Protein 84/69/6295 7.8  6.5 - 8.1 g/dL Final   Albumin 28/41/3244 4.4  3.5 - 5.0 g/dL Final   AST 05/01/7251 31  15 - 41 U/L Final   ALT 08/18/2022 27  0 - 44 U/L Final   Alkaline Phosphatase 08/18/2022 83  38 - 126 U/L Final   Total Bilirubin 08/18/2022 0.5  0.3 - 1.2 mg/dL  Final   GFR, Estimated 08/18/2022 >60  >60 mL/min Final   Comment: (NOTE) Calculated using the CKD-EPI Creatinine Equation (2021)    Anion gap 08/18/2022 13  5 - 15 Final   Performed at [facility], [address], [place], [place] [address]   Alcohol, Ethyl (B) 08/18/2022 <10  <10 mg/dL Final   Comment: (NOTE) Lowest detectable limit for serum alcohol is 10 mg/dL.  For medical purposes only. Performed at [facility], [address], [place], [place] [address]    Opiates 08/18/2022 NONE DETECTED  NONE DETECTED Final   Cocaine 08/18/2022 NONE DETECTED  NONE DETECTED Final   Benzodiazepines 08/18/2022 NONE DETECTED  NONE DETECTED Final   Amphetamines 08/18/2022 NONE DETECTED  NONE  DETECTED Final   Tetrahydrocannabinol 08/18/2022 POSITIVE (A)  NONE DETECTED Final   Barbiturates 08/18/2022 NONE DETECTED  NONE DETECTED Final   Comment: (NOTE) DRUG SCREEN FOR MEDICAL PURPOSES ONLY.  IF CONFIRMATION IS NEEDED FOR ANY PURPOSE, NOTIFY LAB WITHIN 5 DAYS.  LOWEST DETECTABLE LIMITS FOR URINE DRUG SCREEN Drug Class                     Cutoff (ng/mL) Amphetamine and metabolites    1000 Barbiturate and metabolites    200 Benzodiazepine                 200 Opiates and metabolites        300 Cocaine and metabolites        300 THC                            50 Performed at [facility], [address], [place], [place] [address]    WBC 08/18/2022 7.1  4.0 - 10.5 K/uL Final   RBC 08/18/2022 5.14  4.22 - 5.81 MIL/uL Final   Hemoglobin 08/18/2022 15.0  13.0 - 17.0 g/dL Final   HCT 47/82/9562 43.5  39.0 - 52.0 % Final   MCV 08/18/2022 84.6  80.0 - 100.0 fL Final   MCH 08/18/2022 29.2  26.0 - 34.0 pg Final   MCHC 08/18/2022 34.5  30.0 - 36.0 g/dL Final   RDW 13/11/6576 12.4  11.5 - 15.5 % Final   Platelets 08/18/2022 226  150 - 400 K/uL Final   nRBC 08/18/2022 0.0  0.0 - 0.2 % Final   Neutrophils Relative % 08/18/2022 75  % Final    Neutro Abs 08/18/2022 5.4  1.7 - 7.7 K/uL Final   Lymphocytes Relative 08/18/2022 14  % Final   Lymphs Abs 08/18/2022 1.0  0.7 - 4.0 K/uL Final   Monocytes Relative 08/18/2022 8  % Final   Monocytes Absolute 08/18/2022 0.6  0.1 - 1.0 K/uL Final   Eosinophils Relative 08/18/2022 1  % Final   Eosinophils Absolute 08/18/2022 0.1  0.0 - 0.5 K/uL Final   Basophils Relative 08/18/2022 1  % Final   Basophils Absolute 08/18/2022 0.0  0.0 - 0.1 K/uL Final   Immature Granulocytes 08/18/2022 1  % Final   Abs Immature Granulocytes 08/18/2022 0.04  0.00 - 0.07 K/uL Final   Performed at [facility], [address] [place], [place] [address]   SARS Coronavirus 2 by RT PCR 08/21/2022 NEGATIVE  NEGATIVE Final   Comment: (NOTE) SARS-CoV-2 target nucleic acids are NOT DETECTED.  The SARS-CoV-2 RNA is generally detectable in upper respiratory specimens during the acute phase of infection. The lowest concentration of SARS-CoV-2 viral copies this assay can detect is 138 copies/mL. A negative result does not preclude SARS-Cov-2 infection and should not be used as the sole basis for treatment or other patient management decisions. A negative result may occur with  improper specimen collection/handling, submission of specimen other than nasopharyngeal swab, presence of viral mutation(s) within the areas targeted by this assay, and inadequate number of viral copies(<138 copies/mL). A negative result must be combined with clinical observations, patient history, and epidemiological information. The expected result is Negative.  Fact Sheet for Patients:  BloggerCourse.com  Fact Sheet for Healthcare Providers:  SeriousBroker.it  This test is no  t yet approved or cleared by the [place] and  has been authorized for detection and/or diagnosis of SARS-CoV-2 by FDA under an Emergency Use Authorization (EUA).  This EUA will remain  in effect (meaning this test can be used) for the duration of the COVID-19 declaration under Section 564(b)(1) of the Act, 21 U.S.C.section 360bbb-3(b)(1), unless the authorization is terminated  or revoked sooner.       Influenza A by PCR 08/21/2022 NEGATIVE  NEGATIVE Final   Influenza B by PCR 08/21/2022 NEGATIVE  NEGATIVE Final   Comment: (NOTE) The Xpert Xpress SARS-CoV-2/FLU/RSV plus assay is intended as an aid in the diagnosis of influenza from Nasopharyngeal swab specimens and should not be used as a sole basis for treatment. Nasal washings and aspirates are unacceptable for Xpert Xpress SARS-CoV-2/FLU/RSV testing.  Fact Sheet for Patients: BloggerCourse.com  Fact Sheet for Healthcare Providers: SeriousBroker.it  This test is not yet approved or cleared by the [place] FDA and has been authorized for detection and/or diagnosis of SARS-CoV-2 by FDA under an Emergency Use Authorization (EUA). This EUA will remain in effect (meaning this test can be used) for the duration of the COVID-19 declaration under Section 564(b)(1) of the Act, 21 U.S.C. section 360bbb-3(b)(1), unless the authorization is terminated or revoked.     Resp Syncytial Virus by PCR 08/21/2022 NEGATIVE  NEGATIVE Final   Comment: (NOTE) Fact Sheet for Patients: BloggerCourse.com  Fact Sheet for Healthcare Providers: SeriousBroker.it  This test is not yet approved or cleared by the [place] FDA and has been authorized for detection and/or diagnosis of SARS-CoV-2 by FDA under an Emergency Use Authorization (EUA). This EUA will remain in effect (meaning this test can be used) for the duration of the COVID-19 declaration under Section 564(b)(1) of the Act, 21 U.S.C. section 360bbb-3(b)(1), unless the authorization is terminated or revoked.  Performed at [facility], [address], [place], [place] [address]   Admission on 06/21/2022, Discharged on 06/21/2022  Component Date Value Ref Range Status   Sodium 06/21/2022 141  135 - 145 mmol/L Final   Potassium 06/21/2022 3.3 (L)  3.5 - 5.1 mmol/L Final   Chloride 06/21/2022 109  98 - 111 mmol/L Final   CO2 06/21/2022 24  22 - 32 mmol/L Final   Glucose, Bld 06/21/2022 126 (H)  70 - 99 mg/dL Final   Glucose reference range applies only to samples taken after fasting for at least 8 hours.   BUN 06/21/2022 20  6 - 20 mg/dL Final   Creatinine, Ser 06/21/2022 1.02  0.61 - 1.24 mg/dL Final   Calcium 21/30/8657 9.0  8.9 - 10.3 mg/dL Final   Total Protein 84/69/6295 7.3  6.5 - 8.1 g/dL Final   Albumin 28/41/3244 4.1  3.5 - 5.0 g/dL Final   AST 05/01/7251 43 (H)  15 - 41 U/L Final   ALT 06/21/2022 16  0 - 44 U/L Final   Alkaline Phosphatase 06/21/2022 58  38 - 126 U/L Final   Total Bilirubin 06/21/2022 0.9  0.3 - 1.2 mg/dL Final   GFR, Estimated 06/21/2022 >60  >60 mL/min Final   Comment: (NOTE) Calculated using the CKD-EPI Creatinine Equation (2021)    Anion gap 06/21/2022 8  5 - 15 Final   Performed at [facility], [address], [place], [place] [address]   Alcohol, Ethyl (B) 06/21/2022 <10  <10 mg/dL Final   Comment: (NOTE) Lowest detectable limit for serum alcohol is 10 mg/dL.  For medical purposes only. Performed at [facility], [address], [place], [place] [address]    Salicylate Lvl 06/21/2022 <7.0 (L)  7.0 - 30.0 mg/dL Final   Performed at [facility], [address], [place], [place] [address]   Acetaminophen (Tylenol), Serum 06/21/2022 <10 (L)  10 - 30 ug/mL Final   Comment: (NOTE) Therapeutic concentrations vary significantly. A range of 10-30 ug/mL  may be an effective concentration for many patients. However, some  are best treated at concentrations outside of this range. Acetaminophen concentrations >150 ug/mL  at 4 hours after ingestion  and >50 ug/mL at 12 hours after ingestion are often associated with  toxic reactions.  Performed at [facility], [address] [place], [place] [address]    WBC 06/21/2022 7.1  4.0 - 10.5 K/uL Final   RBC 06/21/2022 5.05  4.22 - 5.81 MIL/uL Final   Hemoglobin 06/21/2022 14.6  13.0 - 17.0 g/dL Final   HCT 91/47/8295 45.4  39.0 - 52.0 % Final   MCV 06/21/2022 89.9  80.0 - 100.0 fL Final   MCH 06/21/2022 28.9  26.0 - 34.0 pg Final   MCHC 06/21/2022 32.2  30.0 - 36.0 g/dL Final   RDW 62/13/0865 13.3  11.5 - 15.5 % Final   Platelets 06/21/2022 233  150 - 400 K/uL Final   nRBC 06/21/2022 0.0  0.0 - 0.2 % Final   Neutrophils Relative % 06/21/2022 53  % Final   Neutro Abs 06/21/2022 3.8  1.7 - 7.7 K/uL Final   Lymphocytes Relative 06/21/2022 33  % Final   Lymphs Abs 06/21/2022 2.3  0.7 - 4.0 K/uL Final   Monocytes Relative 06/21/2022 12  % Final   Monocytes Absolute 06/21/2022 0.8  0.1 - 1.0 K/uL Final   Eosinophils Relative 06/21/2022 1  % Final   Eosinophils Absolute 06/21/2022 0.1  0.0 - 0.5 K/uL Final   Basophils Relative 06/21/2022 1  % Final   Basophils Absolute 06/21/2022 0.1  0.0 - 0.1 K/uL Final   Immature Granulocytes 06/21/2022 0  % Final   Abs Immature Granulocytes 06/21/2022 0.01  0.00 - 0.07 K/uL Final   Performed at [facility], [address] [place], [place] [address]    Blood Alcohol level:  Lab Results  Component Value Date   ETH <10 08/18/2022   ETH <10 06/21/2022    Metabolic Disorder Labs: Lab Results  Component Value Date   HGBA1C 5.4 05/02/2022   MPG 108 05/02/2022   MPG 91.06 01/23/2022   Lab Results  Component Value Date   PROLACTIN 26.5 (H) 09/08/2019   Lab Results  Component Value Date   CHOL 150 12/09/2022   TRIG 95 12/09/2022   HDL 40 (L) 12/09/2022   CHOLHDL 3.8 12/09/2022   VLDL 19 12/09/2022   LDLCALC 91 12/09/2022   LDLCALC 96 05/02/2022    Therapeutic Lab  Levels: No results found for: "LITHIUM" Lab Results  Component Value Date   VALPROATE 35 (L) 12/09/2022   VALPROATE 82 05/10/2022   Lab Results  Component Value Date   CBMZ <2.0 (L) 05/14/2021   CBMZ 2.5 (L) 11/20/2019    Physical Findings   AIMS    Flowsheet Row [facility]) [facility] Ad[facility] (Discharged) [facility] Ad[facility] (Discharged) [facility] Ad[facility] (Dis[facility]) [facility] ([facility]) [facility]  AIMS Total Score 0 0 0 0 0      AUDIT    Flowsheet Row [facility]) [facility] Admission (Discharged) from 05/18/2021 in [facility] Admission (Discharged) [facility] Ad[facility] (Discharged) [facility] Ad[facility] (Discharged) [facility]  Alcohol Use Disorder Identification Test Final Score (AUDIT) 0 2 0 0 0      GAD-7    Flowsheet Row Office Visit from 02/18/2022 in [facility]  Total GAD-7 Score 1      PHQ2-9    Flowsheet Row Office Visit from 07/11/2022 in [facility] Office Visit from 06/27/2022 in [facility] Office Visit from 02/18/2022 in [facility] Office Visit from 07/19/2020 in [facility] ED from 05/29/2020 in [facility]  PHQ-2 Total Score 1 1 0 2 1  PHQ-9 Total Score 13 9 0 8 --      Flowsheet Row ED from 12/08/2022 in [facility] ED from 08/18/2022 in [facility] Emergency Department at [facility] ED from 06/21/2022 in [facility] Emergency Department at [facility]  C-SSRS RISK CATEGORY No Risk No Risk No Risk        Musculoskeletal  Strength & Muscle Tone: {desc; muscle tone:32375} Gait & Station: {PE GAIT ED ZHYQ:65784} Patient leans: {Patient Leans:[id number]}  Psychiatric Specialty Exam  Presentation  General Appearance:  Casual; Neat  Eye Contact: Good  Speech: Clear and Coherent; Normal Rate  Speech Volume: Normal  Handedness: Right   Mood and Affect  Mood: Anxious  Affect: Congruent   Thought Process  Thought Processes: Disorganized  Descriptions of Associations:Tangential  Orientation:Partial  Thought Content:Logical  Diagnosis of Schizophrenia or Schizoaffective disorder in past: Yes  Duration of Psychotic Symptoms: Less than six months   Hallucinations:No data recorded Ideas of Reference:None  Suicidal Thoughts:No data recorded Homicidal Thoughts:No data recorded  Sensorium  Memory: Immediate Good; Recent Good; Remote Good  Judgment: Impaired  Insight: Lacking   Executive Functions  Concentration: Poor  Attention Span: Poor  Recall: Poor  Fund of Knowledge: Poor  Language: Poor   Psychomotor Activity  Psychomotor Activity:No data recorded  Assets  Assets: Manufacturing systems engineer; Housing; Desire for Improvement; Physical Health; Social Support   Sleep  Sleep:No data recorded  No data recorded  Physical Exam  Physical Exam ROS Blood pressure (!) 96/56, pulse 99, temperature 98.2 F (36.8 C), temperature source Oral, resp. rate 17, SpO2 98%. There is no height or weight on file to calculate BMI.  Treatment Plan Summary: Daily contact with patient to assess and evaluate symptoms and progress in treatment   Patient meets criteria for inpatient psychiatric admission.  [facility] notified and there is no bed availability.  Social work notified and  patient has been faxed out.   Ardis Hughs, NP 12/09/2022 4:51 PM

## 2022-12-09 NOTE — ED Notes (Signed)
Pt resting in the bed at the moment. Denies pain or any acute distress. Denies SI, HI  &AVH Answers questions by nodding the head. Could not contract for safety at this time. Will continue to monitor for safety.

## 2022-12-09 NOTE — ED Notes (Signed)
Patient observed/assessed in bed/chair resting quietly appearing in no distress and verbalizing no complaints at this time. Will continue to monitor.  

## 2022-12-09 NOTE — Progress Notes (Signed)
LCSW Progress Note  [id number]   Brendan Hogan  12/09/2022  1:08 PM  Description:   Inpatient Psychiatric Referral  Patient was recommended inpatient per Vernard Gambles, NP. There are no available beds at [facility], per [facility] Rona Ravens, RN. Patient was referred to the following out of network facilities:   Destination  Service Provider Address Phone Fax  [facility]  [address] [place], [place] [address] [phone]  [facility]  [address] [place] [address] [phone]  [facility]  [address] [facility] Regino Bellow [place] [address] [phone]  [facility]  [address], [place] [address] [phone]  [facility]  [address] [place] [address] [phone]  [facility]  [address], [place] [address] [phone]  [facility]  [address], [place] [address] [phone]  [facility]  [place] [phone]  [facility]  [address], [place] [address] [phone]  [facility]  [address] [place] [address] [phone]  [facility] [address] [place] [address] [phone]  [facility]  [address] [place] [address] [phone]  [facility] [address]  [address] [place] [address] [phone]  [facility]  [address] [place], [place] [address] [phone]  [facility]  [address] [place] [address] [phone]  [facility]  [address] [place] [address]  [phone]  [facility]  [address], [place] [address] [phone]  [facility]  [address] [place] [address] [phone]  [facility]  [address], [place] [address] [phone]  [facility]  [place] [phone]  Renella Cunas  [place] -- [phone]  [facility]  [address], [place] [address] [phone]  [facility]  [address] [place] [address] [phone]  [facility]  [address] [place] [address] [phone]  [facility]  [address] [place] [address] [phone]    Situation ongoing, CSW to continue following and update chart as more information becomes available.      Cathie Beams, LCSW  12/09/2022 1:08 PM

## 2022-12-10 DIAGNOSIS — F201 Disorganized schizophrenia: Secondary | ICD-10-CM | POA: Diagnosis not present

## 2022-12-10 NOTE — ED Notes (Signed)
Patient  sleeping in no acute stress. RR even and unlabored .Environment secured .Will continue to monitor for safely. 

## 2022-12-10 NOTE — ED Provider Notes (Signed)
Behavioral Health Progress Note  Date and Time: 12/10/2022 6:12 PM Name: Brendan Hogan MRN:  [id number]  HPI: On admission 12/08/2022 by Hillery Jacks, NP, "Brendan Hogan is a 27 year old male that presents to [facility] urgent care accompanied by [employer].  Patient was placed under involuntary commitment.  Per affidavit and petition.  Respondent has a history of schizophrenia has not been compliant with medications.  Wants to fight others.  States he does not care if somebody killed him.  Biting fingers and feet."  This provider spoke to patient's father Brendan Hogan who stated patient has not been taking medication for the past 3 months.  Reports worsening aggression and mood lability.  States patient has not been sleeping.  Reports patient behavior has been get increasingly worse over the past 7 days."  Patient seen face-to-face by this provider and case consulted with Dr. Lucianne Muss, and chart reviewed on 12/10/2022  Subjective:    On today's assessment patient is observed laying in the bed asleep.  He is easily awakened and sits up on today's assessment.  He is alert/oriented x 3.  He participates fully in today's assessment and answers questions more forthcoming.  He appears more logical in his thought process and does not appear disorganized on today's assessment.  Discussed findings in the IVC.  Patient admits he has not taken his medications but agrees to take medications regularly if discharged home.  He denies being aggressive with his family, and biting at his hands and feet.  He does admit to scratching and picking at feet.  He has a bizarre/blunted affect.  He continues to be irritable and labile.  He is denying any symptoms of depression.  He denies SI/HI/AVH.  He is denying being paranoid.  When asked if he had spoken to his parents who in which he would be discharged discharged home to he stated no.  He then proceeded to get up and go to phone and call his parents.     Will continue to recommend inpatient psychiatric admission.  He has been compliant with medications and does appear to be improving.   Attempted to contact patient's parents father Brendan Hogan [phone] with no success.   Diagnosis:  Final diagnoses:  Disorganized schizophrenia (HCC)    Total Time spent with patient: 30 minutes  Past Psychiatric History: See H&P Past Medical History: See H&P Family History: See H&P Family Psychiatric  History: See H&P Social History: See H&P  Additional Social History:    Pain Medications: see MAR Prescriptions: see MAR Over the Counter: see MAR History of alcohol / drug use?: Yes (Per chart review) Longest period of sobriety (when/how long): UTA Negative Consequences of Use:  (UTA) Withdrawal Symptoms: None Name of Substance 1: CBD/THC per chart review patient denies use this date 1 - Age of First Use: UTA 1 - Amount (size/oz): UTA 1 - Frequency: UTA 1 - Duration: UTA 1 - Last Use / Amount: UTA with UDS pending this date 1 - Method of Aquiring: UTA 1- Route of Use: UTA            Fair      Sleep:   Appetite:  Fair  Current Medications:  Current Facility-Administered Medications  Medication Dose Route Frequency Provider Last Rate Last Admin   acetaminophen (TYLENOL) tablet 650 mg  650 mg Oral Q6H PRN Oneta Rack, NP       alum & mag hydroxide-simeth (MAALOX/MYLANTA) 200-200-20 MG/5ML suspension 30 mL  30 mL Oral  Q4H PRN Oneta Rack, NP       divalproex (DEPAKOTE) DR tablet 500 mg  500 mg Oral Q12H Oneta Rack, NP   500 mg at 12/10/22 6045   fluPHENAZine (PROLIXIN) tablet 5 mg  5 mg Oral QHS Oneta Rack, NP   5 mg at 12/09/22 2122   hydrOXYzine (ATARAX) tablet 25 mg  25 mg Oral TID PRN Oneta Rack, NP   25 mg at 12/08/22 2207   OLANZapine zydis (ZYPREXA) disintegrating tablet 10 mg  10 mg Oral Q8H PRN Oneta Rack, NP       And   LORazepam (ATIVAN) tablet 1 mg  1 mg Oral PRN Oneta Rack,  NP       LORazepam (ATIVAN) tablet 2 mg  2 mg Oral Once Oneta Rack, NP       magnesium hydroxide (MILK OF MAGNESIA) suspension 30 mL  30 mL Oral Daily PRN Oneta Rack, NP       nicotine (NICODERM CQ - dosed in mg/24 hours) patch 14 mg  14 mg Transdermal Daily Oneta Rack, NP       traZODone (DESYREL) tablet 50 mg  50 mg Oral QHS PRN Oneta Rack, NP   50 mg at 12/09/22 2238   No current outpatient medications on file.    Labs  Lab Results:  Admission on 12/08/2022  Component Date Value Ref Range Status   WBC 12/09/2022 4.2  4.0 - 10.5 K/uL Final   RBC 12/09/2022 5.37  4.22 - 5.81 MIL/uL Final   Hemoglobin 12/09/2022 15.1  13.0 - 17.0 g/dL Final   HCT 40/98/1191 45.8  39.0 - 52.0 % Final   MCV 12/09/2022 85.3  80.0 - 100.0 fL Final   MCH 12/09/2022 28.1  26.0 - 34.0 pg Final   MCHC 12/09/2022 33.0  30.0 - 36.0 g/dL Final   RDW 47/82/9562 13.0  11.5 - 15.5 % Final   Platelets 12/09/2022 137 (L)  150 - 400 K/uL Final   PLATELET COUNT CONFIRMED BY SMEAR   nRBC 12/09/2022 0.0  0.0 - 0.2 % Final   Neutrophils Relative % 12/09/2022 44  % Final   Neutro Abs 12/09/2022 1.9  1.7 - 7.7 K/uL Final   Lymphocytes Relative 12/09/2022 34  % Final   Lymphs Abs 12/09/2022 1.4  0.7 - 4.0 K/uL Final   Monocytes Relative 12/09/2022 10  % Final   Monocytes Absolute 12/09/2022 0.4  0.1 - 1.0 K/uL Final   Eosinophils Relative 12/09/2022 11  % Final   Eosinophils Absolute 12/09/2022 0.4  0.0 - 0.5 K/uL Final   Basophils Relative 12/09/2022 1  % Final   Basophils Absolute 12/09/2022 0.0  0.0 - 0.1 K/uL Final   Immature Granulocytes 12/09/2022 0  % Final   Abs Immature Granulocytes 12/09/2022 0.01  0.00 - 0.07 K/uL Final   Performed at [facility], [address], [place], [place] [address]   Sodium 12/09/2022 141  135 - 145 mmol/L Final   Potassium 12/09/2022 3.7  3.5 - 5.1 mmol/L Final   Chloride 12/09/2022 102  98 - 111 mmol/L Final   CO2 12/09/2022 25  22 - 32 mmol/L Final    Glucose, Bld 12/09/2022 82  70 - 99 mg/dL Final   Glucose reference range applies only to samples taken after fasting for at least 8 hours.   BUN 12/09/2022 9  6 - 20 mg/dL Final   Creatinine, Ser 12/09/2022 0.91  0.61 - 1.24 mg/dL Final   Calcium 96/29/5284 9.5  8.9 - 10.3 mg/dL Final   Total Protein 13/24/4010 6.7  6.5 - 8.1 g/dL Final   Albumin 27/25/3664 3.9  3.5 - 5.0 g/dL Final   AST 40/34/7425 94 (H)  15 - 41 U/L Final   ALT 12/09/2022 59 (H)  0 - 44 U/L Final   Alkaline Phosphatase 12/09/2022 83  38 - 126 U/L Final   Total Bilirubin 12/09/2022 1.0  0.3 - 1.2 mg/dL Final   GFR, Estimated 12/09/2022 >60  >60 mL/min Final   Comment: (NOTE) Calculated using the CKD-EPI Creatinine Equation (2021)    Anion gap 12/09/2022 14  5 - 15 Final   Performed at [facility], [address], [place], [place] [address]   Hgb A1c MFr Bld 12/09/2022 5.3  4.8 - 5.6 % Final   Comment: (NOTE)         Prediabetes: 5.7 - 6.4         Diabetes: >6.4         Glycemic control for adults with diabetes: <7.0    Mean Plasma Glucose 12/09/2022 105  mg/dL Final   Comment: (NOTE) Performed At: [facility] [address] [place], [place] [id number] Jolene Schimke MD [id number]    Cholesterol 12/09/2022 150  0 - 200 mg/dL Final   Triglycerides 60/01/9322 95  <150 mg/dL Final   HDL 55/73/2202 40 (L)  >40 mg/dL Final   Total CHOL/HDL Ratio 12/09/2022 3.8  RATIO Final   VLDL 12/09/2022 19  0 - 40 mg/dL Final   LDL Cholesterol 12/09/2022 91  0 - 99 mg/dL Final   Comment:        Total Cholesterol/HDL:CHD Risk Coronary Heart Disease Risk Table                     Men   Women  1/2 Average Risk   3.4   3.3  Average Risk       5.0   4.4  2 X Average Risk   9.6   7.1  3 X Average Risk  23.4   11.0        Use the calculated Patient Ratio above and the CHD Risk Table to determine the patient's CHD Risk.        ATP III CLASSIFICATION (LDL):  <100     mg/dL   Optimal  542-706  mg/dL   Near  or Above                    Optimal  130-159  mg/dL   Borderline  237-628  mg/dL   High  >315     mg/dL   Very High Performed at [facility], [address], [place], [place] [address]    TSH 12/09/2022 0.775  0.350 - 4.500 uIU/mL Final   Comment: Performed by a 3rd Generation assay with a functional sensitivity of <=0.01 uIU/mL. Performed at [facility], [address], [place], [place] [address]    POC Amphetamine UR 12/09/2022 None Detected  NONE DETECTED (Cut Off Level 1000 ng/mL) Final   POC Secobarbital (BAR) 12/09/2022 None Detected  NONE DETECTED (Cut Off Level 300 ng/mL) Final   POC Buprenorphine (BUP) 12/09/2022 None Detected  NONE DETECTED (Cut Off Level 10 ng/mL) Final   POC Oxazepam (BZO) 12/09/2022 None Detected  NONE DETECTED (Cut Off Level 300 ng/mL) Final   POC Cocaine UR 12/09/2022 None  Detected  NONE DETECTED (Cut Off Level 300 ng/mL) Final   POC Methamphetamine UR 12/09/2022 None Detected  NONE DETECTED (Cut Off Level 1000 ng/mL) Final   POC Morphine 12/09/2022 None Detected  NONE DETECTED (Cut Off Level 300 ng/mL) Final   POC Methadone UR 12/09/2022 None Detected  NONE DETECTED (Cut Off Level 300 ng/mL) Final   POC Oxycodone UR 12/09/2022 None Detected  NONE DETECTED (Cut Off Level 100 ng/mL) Final   POC Marijuana UR 12/09/2022 Positive (A)  NONE DETECTED (Cut Off Level 50 ng/mL) Final   Valproic Acid Lvl 12/09/2022 35 (L)  50.0 - 100.0 ug/mL Final   Performed at [facility], [address] [place], [place] [address]  Admission on 08/18/2022, Discharged on 08/21/2022  Component Date Value Ref Range Status   Sodium 08/18/2022 138  135 - 145 mmol/L Final   Potassium 08/18/2022 3.8  3.5 - 5.1 mmol/L Final   Chloride 08/18/2022 101  98 - 111 mmol/L Final   CO2 08/18/2022 24  22 - 32 mmol/L Final   Glucose, Bld 08/18/2022 95  70 - 99 mg/dL Final   Glucose reference range applies only to samples taken after fasting for at least 8 hours.   BUN  08/18/2022 13  6 - 20 mg/dL Final   Creatinine, Ser 08/18/2022 0.94  0.61 - 1.24 mg/dL Final   Calcium 60/45/4098 9.5  8.9 - 10.3 mg/dL Final   Total Protein 11/91/4782 7.8  6.5 - 8.1 g/dL Final   Albumin 95/62/1308 4.4  3.5 - 5.0 g/dL Final   AST 65/78/4696 31  15 - 41 U/L Final   ALT 08/18/2022 27  0 - 44 U/L Final   Alkaline Phosphatase 08/18/2022 83  38 - 126 U/L Final   Total Bilirubin 08/18/2022 0.5  0.3 - 1.2 mg/dL Final   GFR, Estimated 08/18/2022 >60  >60 mL/min Final   Comment: (NOTE) Calculated using the CKD-EPI Creatinine Equation (2021)    Anion gap 08/18/2022 13  5 - 15 Final   Performed at [facility], [address] [place], [place] [address]   Alcohol, Ethyl (B) 08/18/2022 <10  <10 mg/dL Final   Comment: (NOTE) Lowest detectable limit for serum alcohol is 10 mg/dL.  For medical purposes only. Performed at [facility], [address], [place], [place] [address]    Opiates 08/18/2022 NONE DETECTED  NONE DETECTED Final   Cocaine 08/18/2022 NONE DETECTED  NONE DETECTED Final   Benzodiazepines 08/18/2022 NONE DETECTED  NONE DETECTED Final   Amphetamines 08/18/2022 NONE DETECTED  NONE DETECTED Final   Tetrahydrocannabinol 08/18/2022 POSITIVE (A)  NONE DETECTED Final   Barbiturates 08/18/2022 NONE DETECTED  NONE DETECTED Final   Comment: (NOTE) DRUG SCREEN FOR MEDICAL PURPOSES ONLY.  IF CONFIRMATION IS NEEDED FOR ANY PURPOSE, NOTIFY LAB WITHIN 5 DAYS.  LOWEST DETECTABLE LIMITS FOR URINE DRUG SCREEN Drug Class                     Cutoff (ng/mL) Amphetamine and metabolites    1000 Barbiturate and metabolites    200 Benzodiazepine                 200 Opiates and metabolites        300 Cocaine and metabolites        300 THC  50 Performed at [facility], [address] [place], [place] [address]    WBC 08/18/2022 7.1  4.0 - 10.5 K/uL Final   RBC 08/18/2022 5.14  4.22 - 5.81  MIL/uL Final   Hemoglobin 08/18/2022 15.0  13.0 - 17.0 g/dL Final   HCT 40/34/7425 43.5  39.0 - 52.0 % Final   MCV 08/18/2022 84.6  80.0 - 100.0 fL Final   MCH 08/18/2022 29.2  26.0 - 34.0 pg Final   MCHC 08/18/2022 34.5  30.0 - 36.0 g/dL Final   RDW 95/63/8756 12.4  11.5 - 15.5 % Final   Platelets 08/18/2022 226  150 - 400 K/uL Final   nRBC 08/18/2022 0.0  0.0 - 0.2 % Final   Neutrophils Relative % 08/18/2022 75  % Final   Neutro Abs 08/18/2022 5.4  1.7 - 7.7 K/uL Final   Lymphocytes Relative 08/18/2022 14  % Final   Lymphs Abs 08/18/2022 1.0  0.7 - 4.0 K/uL Final   Monocytes Relative 08/18/2022 8  % Final   Monocytes Absolute 08/18/2022 0.6  0.1 - 1.0 K/uL Final   Eosinophils Relative 08/18/2022 1  % Final   Eosinophils Absolute 08/18/2022 0.1  0.0 - 0.5 K/uL Final   Basophils Relative 08/18/2022 1  % Final   Basophils Absolute 08/18/2022 0.0  0.0 - 0.1 K/uL Final   Immature Granulocytes 08/18/2022 1  % Final   Abs Immature Granulocytes 08/18/2022 0.04  0.00 - 0.07 K/uL Final   Performed at [facility], [address], [place], [place] [address]   SARS Coronavirus 2 by RT PCR 08/21/2022 NEGATIVE  NEGATIVE Final   Comment: (NOTE) SARS-CoV-2 target nucleic acids are NOT DETECTED.  The SARS-CoV-2 RNA is generally detectable in upper respiratory specimens during the acute phase of infection. The lowest concentration of SARS-CoV-2 viral copies this assay can detect is 138 copies/mL. A negative result does not preclude SARS-Cov-2 infection and should not be used as the sole basis for treatment or other patient management decisions. A negative result may occur with  improper specimen collection/handling, submission of specimen other than nasopharyngeal swab, presence of viral mutation(s) within the areas targeted by this assay, and inadequate number of viral copies(<138 copies/mL). A negative result must be combined with clinical observations, patient history, and  epidemiological information. The expected result is Negative.  Fact Sheet for Patients:  BloggerCourse.com  Fact Sheet for Healthcare Providers:  SeriousBroker.it  This test is no                          t yet approved or cleared by the [place] FDA and  has been authorized for detection and/or diagnosis of SARS-CoV-2 by FDA under an Emergency Use Authorization (EUA). This EUA will remain  in effect (meaning this test can be used) for the duration of the COVID-19 declaration under Section 564(b)(1) of the Act, 21 U.S.C.section 360bbb-3(b)(1), unless the authorization is terminated  or revoked sooner.       Influenza A by PCR 08/21/2022 NEGATIVE  NEGATIVE Final   Influenza B by PCR 08/21/2022 NEGATIVE  NEGATIVE Final   Comment: (NOTE) The Xpert Xpress SARS-CoV-2/FLU/RSV plus assay is intended as an aid in the diagnosis of influenza from Nasopharyngeal swab specimens and should not be used as a sole basis for treatment. Nasal washings and aspirates are unacceptable for Xpert Xpress SARS-CoV-2/FLU/RSV testing.  Fact Sheet for Patients: BloggerCourse.com  Fact Sheet for Healthcare Providers: SeriousBroker.it  This test is not yet approved or cleared by the [place] and has been authorized for detection and/or diagnosis of SARS-CoV-2 by FDA under an Emergency Use Authorization (EUA). This EUA will remain in effect (meaning this test can be used) for the duration of the COVID-19 declaration under Section 564(b)(1) of the Act, 21 U.S.C. section 360bbb-3(b)(1), unless the authorization is terminated or revoked.     Resp Syncytial Virus by PCR 08/21/2022 NEGATIVE  NEGATIVE Final   Comment: (NOTE) Fact Sheet for Patients: BloggerCourse.com  Fact Sheet for Healthcare Providers: SeriousBroker.it  This test is not yet  approved or cleared by the [place] FDA and has been authorized for detection and/or diagnosis of SARS-CoV-2 by FDA under an Emergency Use Authorization (EUA). This EUA will remain in effect (meaning this test can be used) for the duration of the COVID-19 declaration under Section 564(b)(1) of the Act, 21 U.S.C. section 360bbb-3(b)(1), unless the authorization is terminated or revoked.  Performed at [facility], [address], [place], [place] [address]   Admission on 06/21/2022, Discharged on 06/21/2022  Component Date Value Ref Range Status   Sodium 06/21/2022 141  135 - 145 mmol/L Final   Potassium 06/21/2022 3.3 (L)  3.5 - 5.1 mmol/L Final   Chloride 06/21/2022 109  98 - 111 mmol/L Final   CO2 06/21/2022 24  22 - 32 mmol/L Final   Glucose, Bld 06/21/2022 126 (H)  70 - 99 mg/dL Final   Glucose reference range applies only to samples taken after fasting for at least 8 hours.   BUN 06/21/2022 20  6 - 20 mg/dL Final   Creatinine, Ser 06/21/2022 1.02  0.61 - 1.24 mg/dL Final   Calcium 54/00/8676 9.0  8.9 - 10.3 mg/dL Final   Total Protein 19/50/9326 7.3  6.5 - 8.1 g/dL Final   Albumin 71/24/5809 4.1  3.5 - 5.0 g/dL Final   AST 98/33/8250 43 (H)  15 - 41 U/L Final   ALT 06/21/2022 16  0 - 44 U/L Final   Alkaline Phosphatase 06/21/2022 58  38 - 126 U/L Final   Total Bilirubin 06/21/2022 0.9  0.3 - 1.2 mg/dL Final   GFR, Estimated 06/21/2022 >60  >60 mL/min Final   Comment: (NOTE) Calculated using the CKD-EPI Creatinine Equation (2021)    Anion gap 06/21/2022 8  5 - 15 Final   Performed at [facility], [address], [place], [place] [address]   Alcohol, Ethyl (B) 06/21/2022 <10  <10 mg/dL Final   Comment: (NOTE) Lowest detectable limit for serum alcohol is 10 mg/dL.  For medical purposes only. Performed at [facility], [address], [place], [place] [address]    Salicylate Lvl 06/21/2022 <7.0 (L)  7.0 - 30.0  mg/dL Final   Performed at [facility], [address], [place], [place] [address]   Acetaminophen (Tylenol), Serum 06/21/2022 <10 (L)  10 - 30 ug/mL Final   Comment: (NOTE) Therapeutic concentrations vary significantly. A range of 10-30 ug/mL  may be an effective concentration for many patients. However, some  are best treated at concentrations outside of this range. Acetaminophen concentrations >150 ug/mL at 4 hours after ingestion  and >50 ug/mL at 12 hours after ingestion are often associated with  toxic reactions.  Performed at [facility], [address] [place], [place] [address]    WBC 06/21/2022 7.1  4.0 - 10.5 K/uL Final   RBC 06/21/2022 5.05  4.22 -  5.81 MIL/uL Final   Hemoglobin 06/21/2022 14.6  13.0 - 17.0 g/dL Final   HCT 38/75/6433 45.4  39.0 - 52.0 % Final   MCV 06/21/2022 89.9  80.0 - 100.0 fL Final   MCH 06/21/2022 28.9  26.0 - 34.0 pg Final   MCHC 06/21/2022 32.2  30.0 - 36.0 g/dL Final   RDW 29/51/8841 13.3  11.5 - 15.5 % Final   Platelets 06/21/2022 233  150 - 400 K/uL Final   nRBC 06/21/2022 0.0  0.0 - 0.2 % Final   Neutrophils Relative % 06/21/2022 53  % Final   Neutro Abs 06/21/2022 3.8  1.7 - 7.7 K/uL Final   Lymphocytes Relative 06/21/2022 33  % Final   Lymphs Abs 06/21/2022 2.3  0.7 - 4.0 K/uL Final   Monocytes Relative 06/21/2022 12  % Final   Monocytes Absolute 06/21/2022 0.8  0.1 - 1.0 K/uL Final   Eosinophils Relative 06/21/2022 1  % Final   Eosinophils Absolute 06/21/2022 0.1  0.0 - 0.5 K/uL Final   Basophils Relative 06/21/2022 1  % Final   Basophils Absolute 06/21/2022 0.1  0.0 - 0.1 K/uL Final   Immature Granulocytes 06/21/2022 0  % Final   Abs Immature Granulocytes 06/21/2022 0.01  0.00 - 0.07 K/uL Final   Performed at [facility], [address] [place], [place] [address]    Blood Alcohol level:  Lab Results  Component Value Date   ETH <10 08/18/2022   ETH <10 06/21/2022     Metabolic Disorder Labs: Lab Results  Component Value Date   HGBA1C 5.3 12/09/2022   MPG 105 12/09/2022   MPG 108 05/02/2022   Lab Results  Component Value Date   PROLACTIN 26.5 (H) 09/08/2019   Lab Results  Component Value Date   CHOL 150 12/09/2022   TRIG 95 12/09/2022   HDL 40 (L) 12/09/2022   CHOLHDL 3.8 12/09/2022   VLDL 19 12/09/2022   LDLCALC 91 12/09/2022   LDLCALC 96 05/02/2022    Therapeutic Lab Levels: No results found for: "LITHIUM" Lab Results  Component Value Date   VALPROATE 35 (L) 12/09/2022   VALPROATE 82 05/10/2022   Lab Results  Component Value Date   CBMZ <2.0 (L) 05/14/2021   CBMZ 2.5 (L) 11/20/2019    Physical Findings   AIMS    Flowsheet Row [facility]) [facility] Ad[facility] (Dis[facility]) [facility] Ad[facility] (Discharged) [facility] Ad[facility] (Discharged) [facility] Ad[facility] (Dis[facility]) [facility]  AIMS Total Score 0 0 0 0 0      AUDIT    Flowsheet Row [facility]) [facility] Ad[facility] (Discharged) from [facility]/18/2021 in [facility] Admission (Discharged) [facility] Ad[facility] (Discharged) [facility] ([facility]) [facility]  Alcohol Use Disorder Identification Test Final Score (AUDIT) 0 2 0 0 0      GAD-7    Flowsheet Row Office Visit from 02/18/2022 in [facility]  Total GAD-7 Score 1      PHQ2-9    Flowsheet Row Office Visit from 07/11/2022 in [facility] Office  Visit from 06/27/2022 in [facility] Office Visit from 02/18/2022 in [facility] Office Visit from 07/19/2020 in [facility] ED from 05/29/2020 in [facility]  PHQ-2 Total Score 1 1 0 2 1  PHQ-9 Total Score 13 9 0 8 --      Flowsheet Row ED from 12/08/2022 in [facility] ED from 08/18/2022 in [facility] Emergency Department at [facility] ED from 06/21/2022 in [facility] Emergency Department at [facility]  C-SSRS RISK CATEGORY No Risk No Risk No Risk        Musculoskeletal  Strength & Muscle Tone: within normal limits Gait & Station: normal Patient leans: N/A  Psychiatric Specialty Exam  Presentation  General Appearance:  Bizarre  Eye Contact: Fair  Speech: Clear and Coherent; Normal Rate  Speech Volume: Normal  Handedness: Right   Mood and Affect  Mood: Irritable  Affect: Congruent   Thought Process  Thought Processes: Coherent; Disorganized  Descriptions of Associations:Tangential  Orientation:Full (Time, Place and Person)  Thought Content:Logical  Diagnosis of Schizophrenia or Schizoaffective disorder in past: Yes  Duration of Psychotic Symptoms: Greater than six months   Hallucinations:Hallucinations: None  Ideas of Reference:None  Suicidal Thoughts:Suicidal Thoughts: No  Homicidal Thoughts:Homicidal Thoughts: No   Sensorium  Memory: Recent Good; Remote Good  Judgment: Fair  Insight: Lacking   Executive Functions  Concentration: Fair  Attention Span: Fair  Recall: Fair  Fund of Knowledge: Fair  Language: Fair   Psychomotor Activity  Psychomotor Activity:Psychomotor Activity: Normal   Assets  Assets: Communication Skills; Housing   Sleep  Sleep:Sleep: Fair   Nutritional Assessment (For OBS and FBC admissions only) Has the patient had a weight loss or gain of 10  pounds or more in the last 3 months?: No Has the patient had a decrease in food intake/or appetite?: No Does the patient have dental problems?: No Does the patient have eating habits or behaviors that may be indicators of an eating disorder including binging or inducing vomiting?: No Has the patient recently lost weight without trying?: 0 Has the patient been eating poorly because of a decreased appetite?: 0 Malnutrition Screening Tool Score: 0    Physical Exam  Physical Exam Vitals and nursing note reviewed.  Constitutional:      Appearance: Normal appearance.  HENT:     Head: Normocephalic.     Right Ear: External ear normal.  Eyes:     General:        Right eye: No discharge.        Left eye: No discharge.  Cardiovascular:     Rate and Rhythm: Normal rate.  Musculoskeletal:        General: Normal range of motion.     Cervical back: Normal range of motion.  Neurological:     Mental Status: He is alert and oriented to person, place, and time.  Psychiatric:        Attention and Perception: He is inattentive.        Mood and Affect: Affect is labile and blunt.        Behavior: Behavior is agitated and withdrawn.        Thought Content: Thought content is not paranoid. Thought content does not include homicidal or suicidal ideation.        Judgment: Judgment is impulsive.    Review of Systems  Constitutional: Negative.  Negative for chills and fever.  HENT: Negative.  Negative for  hearing loss.   Respiratory: Negative.  Negative for cough.   Cardiovascular:  Negative for chest pain.  Gastrointestinal: Negative.   Genitourinary: Negative.   Musculoskeletal: Negative.   Neurological: Negative.  Negative for tremors and headaches.  Endo/Heme/Allergies: Negative.   Psychiatric/Behavioral: Negative.     Blood pressure (!) 106/58, pulse 91, temperature 98.6 F (37 C), temperature source Oral, resp. rate 20, SpO2 99%. There is no height or weight on file to calculate  BMI.  Treatment Plan Summary: Daily contact with patient to assess and evaluate symptoms and progress in treatment   Patient meets criteria for inpatient psychiatric admission.  [facility] notified and there is no bed availability.  Social work notified and patient has been faxed out.   Ardis Hughs, NP 12/10/2022 6:12 PM

## 2022-12-10 NOTE — ED Notes (Signed)
Patient alert and oriented x 3. Denies SI/HI/AVH. Denies intent or plan to harm self or others. Routine conducted according to faculty protocol. Encourage patient to notify staff with any needs or concerns. Patient verbalized agreement and understanding. Will continue to monitor for safety. 

## 2022-12-10 NOTE — ED Notes (Signed)
Pt sleeping in recliner bed. RR even and unlabored. No noted distress. Will continue to monitor for safety 

## 2022-12-10 NOTE — ED Notes (Signed)
Pt sleeping at present, no distress noted.  Monitoring for safety. 

## 2022-12-10 NOTE — ED Notes (Signed)
Patient refused nicotine patch.

## 2022-12-11 DIAGNOSIS — F201 Disorganized schizophrenia: Secondary | ICD-10-CM | POA: Diagnosis not present

## 2022-12-11 NOTE — ED Notes (Signed)
Pt was asked if he would like to speak with his mother. Pt stated "yes" and the call was transferred to the pt phone.

## 2022-12-11 NOTE — Progress Notes (Signed)
Pt has been accepted to [facility] TODAY 12/11/2022, pending IVC paperwork faxed to [phone].  Pt meets inpatient criteria per Doran Heater, NP  Attending Physician will be Luberta Mutter, MD  Report can be called to: [phone]  Pt can arrive after IVC paperwork is received  Care Team Notified: Doran Heater, NP and Roseanne Reno, RN  Cathie Beams, LCSW  12/11/2022 10:42 AM

## 2022-12-11 NOTE — ED Notes (Signed)
 Patient was provided breakfast

## 2022-12-11 NOTE — ED Notes (Signed)
Patient ate breakfast and is med compliant except for nicotine patch as patient stated he doesn't need it. Patient stated he slept well and denies any pain or discomfort. Patient denies SI,HI, and A/V/H with no plan or intent. Patient remains cooperative at this time.

## 2022-12-11 NOTE — Progress Notes (Signed)
Pt sent to out of network providers for inpatient [facility] placement.  Destination  Service Provider Address Phone [facility]  [address] [place], [place] [address] [phone]  [facility]  [address], [place] [address] [phone]  [facility]  [address] [facility] [place] [address] [phone]  [facility]  [address], [place] [address] [phone]  [facility]  [address] [place] [address] [phone]  [facility]  [address], [place] [address] [phone]  [facility]  [address] [place] [address] [phone]  [facility] Inpatient [facility]  [place] [phone]  [facility]  [address], [place] [address] [phone]  [facility]  1 medical [place] [address] [phone]  [facility] [place] [address] [phone]  [facility]  [address] [place] [address] [phone]  [facility] [address]  [address], [place] [address] [phone]  [facility]  [address] [place], [place] [address] [phone]  [facility]  [address] [place] [address] [phone]  [facility]  [address], [place] [address] [phone]  [facility]  [address] [place] [address] [phone]  [facility]  [address], [place] [address] [phone]  [facility]  [address] [place]  [address] [phone]  [facility]  [place] [phone]  Renella Cunas  [place] -- [phone]  [facility]  [address], [place] [address] [phone]  [facility]  [address] [place] [address] [phone]  [facility]  [address] [place] [address] [phone]  [facility]  [address] [place] [address] [phone]    Maryjean Ka, MSW, LCSWA 12/11/2022 3:00 AM

## 2022-12-11 NOTE — ED Notes (Signed)
Patient observed sleeping at this time. Breathing even and unlabored. No s/s of current distress.

## 2022-12-11 NOTE — ED Notes (Signed)
Pt is in the floor planking.

## 2022-12-11 NOTE — ED Notes (Signed)
Pt observed/assessed in recliner sleeping. RR even and unlabored, appearing in no noted distress. Environmental check complete, will continue to monitor for safety 

## 2022-12-11 NOTE — ED Notes (Signed)
Pt sleeping at present, no distress noted.  Monitoring for safety. 

## 2022-12-11 NOTE — ED Notes (Signed)
Patient is transferring to [facility] at this time via sheriff. IVC paperwork,EMTALA, and other transfer paperwork sent with patient and provided to transport. Patient A&OX4. Denies SI,HI, and A/V/H. Calm and cooperative. No valuables/belongings. Patient in no current distress.

## 2022-12-11 NOTE — ED Provider Notes (Cosign Needed Addendum)
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  He is reclined in observation area upon my approach, no apparent distress.  He is alert and oriented, pleasant and cooperative during assessment.  He presents with euthymic mood, congruent affect.  Victorio may be experiencing delusional thoughts content related to court date tomorrow.  Unable to reach any person for collateral information, attempted to reach patient's father twice.  Patient denies suicidal and homicidal ideations.  He denies auditory and visual hallucinations.  There is no indication that patient is responding to internal stimuli currently.  Patient pleasant and cooperative during stay at [facility].  He endorses average sleep and appetite.  Patient offered support and encouragement.  He gives verbal consent to speak with his parents, attempted to reach patient's father x 2. Updated patient regarding treatment plan to include inpatient psychiatric treatment.  Patient accepted to [name].  Patient IVC.  Chart reviewed and patient discussed with Dr. Nelly Rout on 0 12/11/2022.  1440pm Patient's parents, Axcel Bhagat and Willy Charrier present to [facility] to verbalize concern that patient accepted to [facility] and distance too far to allow parents to visit. Reviewed treatment plan and confirmed that law enforcement would transport patient to and from facility. Parents verbalize understanding of plan.   Stay Summary: 12/10/2022- 1255pmHPI: On admission 12/08/2022 by Hillery Jacks, NP, "Brendan Hogan is a 27 year old male that presents to [facility] urgent care accompanied by  [employer].  Patient was placed under involuntary commitment.  Per affidavit and petition.  Respondent has a history of schizophrenia has not been compliant with medications.  Wants to fight others.  States he does not care if somebody killed him.  Biting fingers and feet."  This provider spoke to patient's father Jaishon Vanderzanden who stated patient has not been taking medication for the past 3 months.  Reports worsening aggression and mood lability.  States patient has not been sleeping.  Reports patient behavior has been get increasingly worse over the past 7 days."   Patient seen face-to-face by this provider and case consulted with Dr. Lucianne Muss, and chart reviewed on 12/10/2022   Subjective:     On today's assessment patient is observed laying in the bed asleep.  He is easily awakened and sits up on today's assessment.  He is alert/oriented x 3.  He participates fully in today's assessment and answers questions more forthcoming.  He appears more logical in his thought process and does not appear disorganized on today's assessment.  Discussed findings in the IVC.  Patient admits he has not taken his medications but agrees to take medications regularly if discharged home.  He denies being aggressive with his family, and biting at his hands and feet.  He does admit to scratching and picking at feet.  He has a bizarre/blunted affect.  He continues to be irritable and labile.  He is denying any symptoms of depression.  He denies SI/HI/AVH.  He is denying being paranoid.  When asked if he had spoken to his parents who in which he would be discharged discharged home to he stated no.  He then proceeded to get up and go to phone  and call his parents.      Will continue to recommend inpatient psychiatric admission.  He has been compliant with medications and does appear to be improving.     Attempted to contact patient's parents father Dawson Bills [phone] with no success.  Total Time spent  with patient: 30 minutes  Past Psychiatric History: See above Past Medical History: See above Family History: See above Family Psychiatric History: See above Social History: Unemployed, resides with family Tobacco Cessation:  A prescription for an FDA-approved tobacco cessation medication was offered at discharge and the patient refused  Current Medications:  Current Facility-Administered Medications  Medication Dose Route Frequency Provider Last Rate Last Admin   acetaminophen (TYLENOL) tablet 650 mg  650 mg Oral Q6H PRN Oneta Rack, NP       alum & mag hydroxide-simeth (MAALOX/MYLANTA) 200-200-20 MG/5ML suspension 30 mL  30 mL Oral Q4H PRN Oneta Rack, NP       divalproex (DEPAKOTE) DR tablet 500 mg  500 mg Oral Q12H Oneta Rack, NP   500 mg at 12/11/22 0947   fluPHENAZine (PROLIXIN) tablet 5 mg  5 mg Oral QHS Oneta Rack, NP   5 mg at 12/10/22 2116   hydrOXYzine (ATARAX) tablet 25 mg  25 mg Oral TID PRN Oneta Rack, NP   25 mg at 12/10/22 2116   OLANZapine zydis (ZYPREXA) disintegrating tablet 10 mg  10 mg Oral Q8H PRN Oneta Rack, NP       And   LORazepam (ATIVAN) tablet 1 mg  1 mg Oral PRN Oneta Rack, NP       LORazepam (ATIVAN) tablet 2 mg  2 mg Oral Once Oneta Rack, NP       magnesium hydroxide (MILK OF MAGNESIA) suspension 30 mL  30 mL Oral Daily PRN Oneta Rack, NP       nicotine (NICODERM CQ - dosed in mg/24 hours) patch 14 mg  14 mg Transdermal Daily Oneta Rack, NP       traZODone (DESYREL) tablet 50 mg  50 mg Oral QHS PRN Oneta Rack, NP   50 mg at 12/10/22 2116   No current outpatient medications on file.    PTA Medications:  Facility Ordered Medications  Medication   acetaminophen (TYLENOL) tablet 650 mg   alum & mag hydroxide-simeth (MAALOX/MYLANTA) 200-200-20 MG/5ML suspension 30 mL   magnesium hydroxide (MILK OF MAGNESIA) suspension 30 mL   hydrOXYzine (ATARAX) tablet 25 mg   traZODone (DESYREL) tablet 50 mg    OLANZapine zydis (ZYPREXA) disintegrating tablet 10 mg   And   LORazepam (ATIVAN) tablet 1 mg   divalproex (DEPAKOTE) DR tablet 500 mg   fluPHENAZine (PROLIXIN) tablet 5 mg   LORazepam (ATIVAN) tablet 2 mg   nicotine (NICODERM CQ - dosed in mg/24 hours) patch 14 mg       07/11/2022   11:18 AM 06/27/2022    2:09 PM 02/18/2022   12:20 PM  Depression screen PHQ 2/9  Decreased Interest 0 1 0  Down, Depressed, Hopeless 1 0 0  PHQ - 2 Score 1 1 0  Altered sleeping 3 2 0  Tired, decreased energy 2 2 0  Change in appetite 1 1 0  Feeling bad or failure about yourself  0 0 0  Trouble concentrating 3 1 0  Moving slowly or fidgety/restless 3 2 0  Suicidal thoughts 0 0 0  PHQ-9 Score 13 9 0  Difficult doing work/chores  Not difficult at all    Flowsheet Row ED from 12/08/2022 in [facility] ED from 08/18/2022 in [facility] Emergency Department at [facility] ED from 06/21/2022 in [facility] Emergency Department at [facility]  C-SSRS RISK CATEGORY No Risk No Risk No Risk       Musculoskeletal  Strength & Muscle Tone: within normal limits Gait & Station: normal Patient leans: N/A  Psychiatric Specialty Exam  Presentation  General Appearance:  Casual  Eye Contact: Fair  Speech: Clear and Coherent; Normal Rate  Speech Volume: Normal  Handedness: Right   Mood and Affect  Mood: Euthymic  Affect: Congruent   Thought Process  Thought Processes: Coherent; Goal Directed  Descriptions of Associations:Intact  Orientation:Full (Time, Place and Person)  Thought Content:Logical; WDL  Diagnosis of Schizophrenia or Schizoaffective disorder in past: Yes    Hallucinations:Hallucinations: None  Ideas of Reference:Delusions  Suicidal Thoughts:Suicidal Thoughts: No  Homicidal Thoughts:Homicidal Thoughts: No   Sensorium  Memory: Immediate Fair  Judgment: Intact  Insight: Shallow   Executive Functions   Concentration: Good  Attention Span: Good  Recall: Good  Fund of Knowledge: Fair  Language: Fair   Psychomotor Activity  Psychomotor Activity: Psychomotor Activity: Normal   Assets  Assets: Communication Skills; Desire for Improvement; Financial Resources/Insurance; Housing; Physical Health; Social Support; Resilience   Sleep  Sleep: Sleep: Fair   No data recorded  Physical Exam  Physical Exam Vitals and nursing note reviewed.  Constitutional:      Appearance: Normal appearance. He is well-developed.  HENT:     Head: Normocephalic.     Nose: Nose normal.  Cardiovascular:     Rate and Rhythm: Normal rate.  Pulmonary:     Effort: Pulmonary effort is normal.  Musculoskeletal:        General: Normal range of motion.     Cervical back: Normal range of motion.  Skin:    General: Skin is warm and dry.  Neurological:     Mental Status: He is alert and oriented to person, place, and time.  Psychiatric:        Attention and Perception: Attention normal.        Mood and Affect: Affect normal. Mood is anxious.        Speech: Speech normal.        Behavior: Behavior normal. Behavior is cooperative.        Thought Content: Thought content is delusional.        Cognition and Memory: Cognition normal.    Review of Systems  Constitutional: Negative.   HENT: Negative.    Eyes: Negative.   Respiratory: Negative.    Cardiovascular: Negative.   Gastrointestinal: Negative.   Genitourinary: Negative.   Musculoskeletal: Negative.   Skin: Negative.   Neurological: Negative.   Psychiatric/Behavioral:  The patient is nervous/anxious.    Blood pressure 98/68, pulse 97, temperature 98.5 F (36.9 C), temperature source Oral, resp. rate 20, SpO2 99%. There is no height or weight on file to calculate BMI.  Demographic Factors:  Male and Unemployed  Loss Factors: Legal issues  Historical Factors: Impulsivity  Risk Reduction Factors:   Living with another  person, especially a relative, Positive social support, Positive therapeutic relationship, and Positive coping skills or problem solving skills  Continued Clinical Symptoms:  Previous Psychiatric Diagnoses and Treatments  Cognitive Features That Contribute To Risk:  None    Suicide Risk:  Minimal: No identifiable suicidal ideation.  Patients presenting with no  risk factors but with morbid ruminations; may be classified as minimal risk based on the severity of the depressive symptoms  Plan Of Care/Follow-up recommendations:  Patient IVC upon arrival.  Inpatient psychiatric treatment recommended.  Disposition: Patient accepted to [facility] for inpatient psychiatric treatment by Dr. Luberta Mutter.  Lenard Lance, FNP 12/11/2022, 10:55 AM
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
## 2023-05-21 ENCOUNTER — Emergency Department (HOSPITAL_COMMUNITY)
Admission: EM | Admit: 2023-05-21 | Discharge: 2023-05-27 | Disposition: A | Discharge status: Other Acute Inpatient Hospital | Payer: PRIVATE HEALTH INSURANCE | Attending: Emergency Medicine | Admitting: Emergency Medicine

## 2023-05-21 DIAGNOSIS — Z1152 Encounter for screening for COVID-19: Secondary | ICD-10-CM | POA: Insufficient documentation

## 2023-05-21 DIAGNOSIS — F201 Disorganized schizophrenia: Secondary | ICD-10-CM | POA: Diagnosis present

## 2023-05-21 DIAGNOSIS — F209 Schizophrenia, unspecified: Secondary | ICD-10-CM

## 2023-05-21 LAB — CBC WITH DIFFERENTIAL/PLATELET
Abs Immature Granulocytes: 0.01 10*3/uL (ref 0.00–0.07)
Basophils Absolute: 0 10*3/uL (ref 0.0–0.1)
Basophils Relative: 1 %
Eosinophils Absolute: 0 10*3/uL (ref 0.0–0.5)
Eosinophils Relative: 1 %
HCT: 43.9 % (ref 39.0–52.0)
Hemoglobin: 15.2 g/dL (ref 13.0–17.0)
Immature Granulocytes: 0 %
Lymphocytes Relative: 19 %
Lymphs Abs: 1 10*3/uL (ref 0.7–4.0)
MCH: 29.2 pg (ref 26.0–34.0)
MCHC: 34.6 g/dL (ref 30.0–36.0)
MCV: 84.3 fL (ref 80.0–100.0)
Monocytes Absolute: 0.6 10*3/uL (ref 0.1–1.0)
Monocytes Relative: 11 %
Neutro Abs: 3.6 10*3/uL (ref 1.7–7.7)
Neutrophils Relative %: 68 %
Platelets: 189 10*3/uL (ref 150–400)
RBC: 5.21 MIL/uL (ref 4.22–5.81)
RDW: 12.4 % (ref 11.5–15.5)
WBC: 5.2 10*3/uL (ref 4.0–10.5)
nRBC: 0 % (ref 0.0–0.2)

## 2023-05-21 LAB — COMPREHENSIVE METABOLIC PANEL
ALT: 36 U/L (ref 0–44)
AST: 39 U/L (ref 15–41)
Albumin: 4.2 g/dL (ref 3.5–5.0)
Alkaline Phosphatase: 87 U/L (ref 38–126)
Anion gap: 14 (ref 5–15)
BUN: 17 mg/dL (ref 6–20)
CO2: 25 mmol/L (ref 22–32)
Calcium: 9.7 mg/dL (ref 8.9–10.3)
Chloride: 104 mmol/L (ref 98–111)
Creatinine, Ser: 1.27 mg/dL — ABNORMAL HIGH (ref 0.61–1.24)
GFR, Estimated: >60 mL/min (ref >60)
Glucose, Bld: 87 mg/dL (ref 70–99)
Potassium: 3.4 mmol/L — ABNORMAL LOW (ref 3.5–5.1)
Sodium: 143 mmol/L (ref 135–145)
Total Bilirubin: 1.2 mg/dL (ref 0.0–1.2)
Total Protein: 7 g/dL (ref 6.5–8.1)

## 2023-05-21 LAB — ETHANOL: Alcohol, Ethyl (B): <10 mg/dL (ref <10)

## 2023-05-21 MED ORDER — ZIPRASIDONE MESYLATE 20 MG IM SOLR
10.0000 mg | Freq: Once | INTRAMUSCULAR | Status: AC
  Administered 2023-05-21: 10 mg via INTRAMUSCULAR
  Filled 2023-05-21: qty 20
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
## 2023-05-21 NOTE — BH Assessment (Addendum)
At 10:35 PM, the patient was deferred to [facility]IS. The patient's care team provided updates. Junie Panning, the [facility]IS care coordinator, will inform the care team of the scheduled time for the patient's telepsych session to be initiated by the IRIS provider. For any questions, please contact the [facility]IS care coordinator at [phone].n deffered to IRIS, Lauryn care coordinator.
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
## 2023-05-22 ENCOUNTER — Encounter (HOSPITAL_COMMUNITY): Payer: Self-pay | Admitting: Psychiatry

## 2023-05-22 DIAGNOSIS — F201 Disorganized schizophrenia: Secondary | ICD-10-CM | POA: Diagnosis not present

## 2023-05-22 LAB — SARS CORONAVIRUS 2 BY RT PCR: SARS Coronavirus 2 by RT PCR: NEGATIVE

## 2023-05-22 MED ORDER — FLUPHENAZINE HCL 5 MG PO TABS
5.0000 mg | ORAL_TABLET | Freq: Two times a day (BID) | ORAL | Status: DC
  Administered 2023-05-24 – 2023-05-27 (×3): 5 mg via ORAL
  Filled 2023-05-22 (×8): qty 1

## 2023-05-22 MED ORDER — MIRTAZAPINE 15 MG PO TABS
15.0000 mg | ORAL_TABLET | Freq: Every day | ORAL | Status: DC
  Filled 2023-05-22 (×4): qty 1

## 2023-05-22 MED ORDER — DIVALPROEX SODIUM 500 MG PO DR TAB
500.0000 mg | DELAYED_RELEASE_TABLET | Freq: Two times a day (BID) | ORAL | Status: DC
  Administered 2023-05-24 – 2023-05-27 (×4): 500 mg via ORAL
  Filled 2023-05-22 (×9): qty 1

## 2023-05-22 MED ORDER — BENZTROPINE MESYLATE 1 MG PO TABS
1.0000 mg | ORAL_TABLET | Freq: Two times a day (BID) | ORAL | Status: DC
  Administered 2023-05-24 – 2023-05-27 (×4): 1 mg via ORAL
  Filled 2023-05-22 (×9): qty 1

## 2023-05-22 NOTE — Progress Notes (Signed)
LCSW Progress Note  [id number]   Brendan Hogan  05/22/2023  10:45 AM  Description:   Inpatient Psychiatric Referral  Patient was recommended inpatient per Arsenio Loader NP There are no available beds at [facility], per [facility] Malva Limes RN. Patient was referred to the following out of network facilities:    Destination  Service Provider Address Phone Fax  [facility] [address], [place] [address] [phone]  [facility] [address], [place] [address] [phone]  [facility] [address] [place] [address] [phone]  [facility] [address] [place], [place] [address] [phone]  [facility] [address] [place] [address] [phone]  [facility] [address], [place] [address] [phone]  [facility] [address] [place] [address] [phone]  [facility] [address] [place] [address] [phone]  [facility] [address], [place] [address] [phone]  [facility] [address], [place] [address] [phone]  [facility] EFAX [address] [place], [place] [phone]  [facility] [address], [place] [address] [phone]  [facility] [address] [place] [address] [phone]      Situation ongoing, CSW to continue following and update chart as more information becomes available.      [place] [name] MSW, LCSW  05/22/2023 10:45 AM

## 2023-05-22 NOTE — ED Notes (Signed)
Pt provided with more water per patient request.

## 2023-05-22 NOTE — ED Notes (Signed)
Pt refusing to take medications at this time. States "at home Im all good, I don't take medications". Attempted to educate pt on importance of taking medications, however pt still refusing.

## 2023-05-22 NOTE — ED Notes (Signed)
Pt appears to be responding to internal stimuli. Pt having conversation with nobody else in the room.

## 2023-05-22 NOTE — ED Notes (Signed)
Pt lying in bed at this time, cooperative with staff.
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
## 2023-05-22 NOTE — ED Provider Notes (Signed)
Emergency Medicine Observation Re-evaluation Note  Brendan Hogan is a 28 y.o. male, seen on rounds today.  Pt initially presented to the ED for complaints of IVC Currently, the patient is asleep in his room after restraints were just removed early this morning.  Physical Exam  BP 120/75   Pulse 95   Temp 97.6 F (36.4 C) (Oral)   Resp 16   SpO2 100%  Physical Exam General: Resting in bed without agitation Cardiac: Not tachycardic on last vital signs Lungs: Symmetric rise and fall of chest without respiratory distress Psych: No agitation at this time as he is asleep  ED Course / MDM  EKG:EKG Interpretation Date/Time:  Wednesday May 21 2023 21:22:57 EST Ventricular Rate:  132 PR Interval:  147 QRS Duration:  85 QT Interval:  283 QTC Calculation: 420 R Axis:   18  Text Interpretation: Sinus tachycardia Consider right atrial enlargement Repol abnrm suggests ischemia, anterolateral When compared with ECG of 12/11/2022, HEART RATE has increased Confirmed by Dione Booze ([id number]) on 05/22/2023 3:33:04 AM  I have reviewed the labs performed to date as well as medications administered while in observation.  Recent changes in the last 24 hours include patient reportedly was very agitated had to get medications and safety restraints.  They were subsequently removed prior to my evaluation and he is resting comfortably without agitation now.  Patient is sleeping  Plan  Current plan is for awaiting psychiatric recommendations.    Kaelen Brennan, Canary Brim, MD 05/22/23 [phone]

## 2023-05-23 DIAGNOSIS — F201 Disorganized schizophrenia: Secondary | ICD-10-CM

## 2023-05-23 MED ORDER — STERILE WATER FOR INJECTION IJ SOLN
INTRAMUSCULAR | Status: AC
  Administered 2023-05-23: 10 mL
  Filled 2023-05-23: qty 10

## 2023-05-23 MED ORDER — OLANZAPINE 10 MG IM SOLR
5.0000 mg | Freq: Once | INTRAMUSCULAR | Status: AC | PRN
  Administered 2023-05-23: 5 mg via INTRAMUSCULAR
  Filled 2023-05-23: qty 10

## 2023-05-23 NOTE — ED Provider Notes (Signed)
Emergency Medicine Observation Re-evaluation Note  Brendan Hogan is a 28 y.o. male, seen on rounds today.  Pt initially presented to the ED for complaints of IVC Currently, the patient is sleeping.  Physical Exam  BP 106/67 (BP Location: Left Arm)   Pulse 76   Temp 97.8 F (36.6 C) (Oral)   Resp 18   SpO2 100%  Physical Exam General: Sleeping Cardiac: Regular Lungs: No acute distress Psych: Sleeping but when awake can be intermittently agitated  ED Course / MDM  EKG:EKG Interpretation Date/Time:  Wednesday May 21 2023 21:22:57 EST Ventricular Rate:  132 PR Interval:  147 QRS Duration:  85 QT Interval:  283 QTC Calculation: 420 R Axis:   18  Text Interpretation: Sinus tachycardia Consider right atrial enlargement Repol abnrm suggests ischemia, anterolateral When compared with ECG of 12/11/2022, HEART RATE has increased Confirmed by Dione Booze ([id number]) on 05/22/2023 3:33:04 AM  I have reviewed the labs performed to date as well as medications administered while in observation.  Recent changes in the last 24 hours include became agitated last night requiring Zyprexa IM due to escalating behavior and refusal to take oral meds.  Plan  Current plan is for inpatient psychiatric placement currently IVC.    Gwyneth Sprout, MD 05/23/23 [phone]

## 2023-05-23 NOTE — ED Provider Notes (Signed)
Emergency Medicine Observation Re-evaluation Note  ILAI HILLER is a 28 y.o. male, seen on rounds today.  Pt initially presented to the ED for complaints of psychosis. Pt currently resting. No new c/o this AM.  Physical Exam  BP 106/67 (BP Location: Left Arm)   Pulse 76   Temp 97.8 F (36.6 C) (Oral)   Resp 18   SpO2 100%  Physical Exam General: resting.  Cardiac: regular rate.  Lungs: breathing comfortably. Psych: calm.   ED Course / MDM   I have reviewed the labs performed to date as well as medications administered while in observation.  Recent changes in the last 24 hours include ED obs, med management, reassessment.   Plan  Current plan is for  [facility] team placement.   Med management and disposition per [facility] team.     Cathren Laine, MD 05/23/23 [phone]

## 2023-05-23 NOTE — ED Notes (Signed)
PT is currently resting.Lunch was placed bedside.

## 2023-05-23 NOTE — ED Notes (Signed)
PT refused temperature check when NT was doing vitals

## 2023-05-23 NOTE — ED Notes (Signed)
PT watching tv and asked for lights to be cut off.

## 2023-05-23 NOTE — ED Notes (Signed)
Patient states he does not take meds unless he needs it; pt states he is sleeping and he still refuses urine sample at this time-Monique,RN
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
## 2023-05-23 NOTE — ED Notes (Addendum)
Patient pacing in room then doing push-ups in room. Patient redirectable but refusing all oral medication. Dr. Wallace Cullens updated on patient case - orders received.

## 2023-05-23 NOTE — ED Notes (Signed)
IVC CURRENT EXP 1/29

## 2023-05-23 NOTE — ED Notes (Signed)
Father would prefer son to be sent to [facility] not [facility] or anywhere in [place]

## 2023-05-23 NOTE — Progress Notes (Signed)
LCSW Progress Note  [id number]   Brendan Hogan  05/23/2023  9:29 AM  Description:   Inpatient Psychiatric Referral  Patient was recommended inpatient per Phebe Colla NP There [facility] no available beds at [facility], per [facility] RN. Patient was referred to the following out of network facilities:    Destination  Service Provider Address Phone Fax  [facility] [address] [place] [address] [phone]  [facility] [address], [place] [address] [phone]  [facility] [address] [place] [address] [phone]  [facility] [address] [place], [place] [address] [phone]  [facility] [address] [place] [address] [phone]  [facility] [address] [place] [address] [phone]  [facility] [address] [place] [address] [phone]  [facility] [address], [place] [address] [phone]  [facility] [address] [place] [address] [phone]  [facility] [address] [place] [address] [phone]  [facility] EFAX [address] [place], [place] [phone]  [facility] [address] [place] [address] [phone]  [facility] [address] [place], [place] [address] [phone]  [facility] [address] [place], [place] [address] [phone]  [facility] [address] [place] [address] [phone]  [facility] [address] [place], [place] [address]  [phone]  [facility] [address] [place] [address] [phone]  [facility] [address], [place] [address] [phone]  [facility] [place] [phone]  [facility] [address] [place] [address] [phone]  [facility] [address] [place] [address] [phone]  [facility] [address], [place] [address] [phone]  [facility] [address] [place] [address] [phone]  [facility] [address] [place], [place] [address] [phone]  [facility] [address], [place] [address] [phone]  [facility] Inpatient [facility] [place] [phone]  [facility] [address] [place] [address] [phone]  [facility] Patient Placement [facility], [place] [phone]      Situation ongoing, CSW to continue following and update chart as more information becomes available.      [name], MSW, LCSW  05/23/2023 9:29 AM

## 2023-05-23 NOTE — ED Notes (Signed)
Window shade on PT's  door fixed by maintenance. We are now able to see PT through shade with door closed

## 2023-05-23 NOTE — Progress Notes (Addendum)
LCSW Progress Note  [id number]   CRISTOFHER LIVECCHI  05/23/2023  12:27 AM    Inpatient Behavioral Health Placement  Pt meets inpatient criteria per Shearon Stalls. There are no available beds [facility] per [facility] Linsey Strader,RN.   -This CSW attempted to follow up with [facility] Reece Levy but was not successful during this shift.  Referral was sent to the following facilities;   Destination  Service Provider Address Phone Fax  [facility] [address], [place] [address] [phone]  [facility] [address], [place] [address] [phone]  [facility] [address] [place] [address] [phone]  [facility] [address] [place], [place] [address] [phone]  [facility] [address] [name] [address] [phone]  [facility] [address], [place] [address] [phone]  [facility] [address] [place] [address] [phone]  [facility] [address] [place] [address] [phone]  [facility] [address] [place] [address] [phone]  [facility] [address], [place] [address] [phone]  [facility] EFAX [address] [place], [place] [phone]  [facility] [address], [place] [address] [phone]  [facility] [address] [place], [place] [address] [phone]  [facility] [address] [place], [place] [address] [phone]  [facility] [address] [place] [address]  [phone]  [facility] [address] [place], [place] [address] [phone]  [facility] [address], [place] [address] [phone]  [facility] [address], [place] [address] [phone]  [facility] [place] [phone]  [facility] [address] [place] [address] [phone]  [facility] [address] [place] [address] [phone]  [facility] [address], [place] [address] [phone]  [facility] [address], [place] [address] [phone]  [facility] [address], [place] [address] [phone]  [facility] [address], [place] [address] [phone]  [facility] Inpatient [facility] [place] [phone]  [facility] [address] [place] [address] [phone]  [facility] Patient Placement [facility], [place] [phone]    Situation ongoing,  CSW will follow up.    Maryjean Ka, MSW, [facility] 05/23/2023 12:27 AM

## 2023-05-23 NOTE — ED Notes (Addendum)
PT is talking to his mom who is bedside. PT'S family was informed of the visitation hours and understood that this was a one time allowance of visitation outside of the scheduled time.PT is also requesting regular underwear and more water. PT is still stating that he is unable to urinate at this time. PT isnt eating food. Ive told PT that we may need to bladder scan him since he hasn't urinated all day and has drank a large amount of water.
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
## 2023-05-23 NOTE — Progress Notes (Signed)
LCSW Progress Note  [id number]   Brendan Hogan  05/23/2023  2:59 PM  Description:   Inpatient Psychiatric Referral  Patient was recommended inpatient per Phebe Colla NP. There [facility] beds [facility], per [facility] RN. Patient was referred to the following out of network facilities:    Destination  Service Provider Address Phone Fax  [facility] [address] [place] [address] [phone]  [facility] [address], [place] [address] [phone]  [facility] [address] [place] [address] [phone]  [facility] [address] [place], [place] [address] [phone]  [facility] [address] [place] [address] [phone]  [facility] [address], [place] [address] [phone]  [facility] [address] [place] [address] [phone]  [facility] [address] [place] [address] [phone]  [facility] [address] [place] [address] [phone]  [facility] [address] [place] [address] [phone]  [facility] EFAX [address] [place], [place] [phone]  [facility] [address], [place] [address] [phone]  [facility] [address] [place], [place] [address] [phone]  [facility] [address] [place], [place] [address] [phone]  [facility] [address] [place] [address] [phone]  [facility] [address] [place], [place] [address]  [phone]  [facility] [address], [place] [address] [phone]  [facility] [address] [place] [address] [phone]  [facility] [place] [phone]  [facility] [address] [place] [address] [phone]  [facility] [address] [place] [address] [phone]  [facility] [address], [place] [address] [phone]  [facility] [address] [place] [address] [phone]  [facility] [address] [place], [place] [address] [phone]  [facility] [address], [place] [address] [phone]  [facility] Inpatient [facility] [place] [phone]  [facility] [address] [place] [address] [phone]  [facility] Patient Placement [facility], [place] [phone]      Situation ongoing, CSW to continue following and update chart as more information becomes available.      [place] [name] , MSW, LCSW  05/23/2023 2:59 PM

## 2023-05-23 NOTE — ED Notes (Signed)
PT is alert and asked for water. I also asked PT if he would like to take his medications now.PT stated once again that he does not want any kind of medication. Offered him tv  and the lights off, PT agreed.

## 2023-05-23 NOTE — ED Notes (Signed)
When asking PT if he would like to take the medication he refused earlier, PT stated that he does not take medication. I informed the PT that the physician has medication scheduled for him to take while he is here and PT still refused.

## 2023-05-23 NOTE — ED Notes (Signed)
PT is alert and watching tv.

## 2023-05-24 DIAGNOSIS — F201 Disorganized schizophrenia: Secondary | ICD-10-CM | POA: Diagnosis not present

## 2023-05-24 NOTE — ED Provider Notes (Signed)
Emergency Medicine Observation Re-evaluation Note  Brendan Hogan is a 28 y.o. male, seen on rounds today.  Pt initially presented to the ED for complaints of IVC Currently, the patient is awake, noted to be speaking to himself and responding to internal stimuli.  Physical Exam  BP 111/72 (BP Location: Left Arm)   Pulse 89   Temp 98.1 F (36.7 C) (Oral)   Resp 17   Ht 5\' 6"  (1.676 m)   Wt 68 kg   SpO2 100%   BMI 24.20 kg/m  Physical Exam General: No distress Cardiac: Regular rate Lungs: No respiratory distress Psych: Responding to internal stimuli  ED Course / MDM  EKG:EKG Interpretation Date/Time:  Wednesday May 21 2023 21:22:57 EST Ventricular Rate:  132 PR Interval:  147 QRS Duration:  85 QT Interval:  283 QTC Calculation: 420 R Axis:   18  Text Interpretation: Sinus tachycardia Consider right atrial enlargement Repol abnrm suggests ischemia, anterolateral When compared with ECG of 12/11/2022, HEART RATE has increased Confirmed by Dione Booze ([id number]) on 05/22/2023 3:33:04 AM  I have reviewed the labs performed to date as well as medications administered while in observation.  Recent changes in the last 24 hours include -no new changes. Psychiatry team suspects disorganized schizophrenia. He is admitted by psychiatry service for stabilization  Plan  Current plan is for holding patient in the ER for psychiatric stabilization.    Derwood Kaplan, MD 05/24/23 1236

## 2023-05-24 NOTE — Progress Notes (Signed)
Patient has been denied by [facility] and has been faxed out. Patient meets BH inpatient criteria per Phebe Colla, NP. Patient has been faxed out to the following facilities:   [facility] - Request [address] [place] [facility] [phone]--[facility] [facility] - Request [address], [place] [phone]--ONGEX-BMWUX [facility] - Request Sent--[address] [place] [phone]--GLOVF-IEPPIRJ Medical CenterPending - Request [address] [place], [place] [phone]--KGURK-YHCW [facility] Center[facility]ending - Request Sent--[address] [place] [phone]--WNIOE-VOJJKKX [facility] Center[facility] - Request [address] [place] [phone]--CHENI-DPOE Point RegionalPending - Request Sent--[address], [place] [phone]--OIZTI-WPYKD [facility] - Request Sent--[address] [place] [phone]--XBDZH-GDJME [facility] HealthPending - Request [address] [place] [phone]--CXKGY-JEH [facility] HealthPending - Request Sent--[address] [place] [phone]--OMVEH-MCN Luvenia Redden - Request Sent--[address], [phone]--[facility] Health[facility] - Request [address], [place] [phone]--KCLEX-NTZGYFVC SpringsPending - Request Sent--[address] [place], [place] [phone]--[facility] - Request Sent--[address] [place], [place] [phone]--[facility] - Request Sent--[address] [place]  [phone]--LTJQZ-ESPQZRA [facility] - Request [address] [place], [place] [phone]--JGOTL-XBWIOMB Plain HospitalPending - Request Sent--[address] [place] [phone]--NOIBB-CWUGQBV HealthPending - Request [address] [place] [phone]--VXYIA-XKPVVZ BED Management Behavioral HealthPending - Request Sent--[phone]--[facility] - Request Sent--[address] [place] [phone]--OITGP-QDIY [facility]ending - Request Sent--[address] [place] [facility] [phone]--OPFYT-WKMQ [facility] Center[facility]ending - Request [address] [place] [phone]--BTYOM-AYOKH Medical CenterPending - Request Sent--[address] [place] [phone]--[facility] - Request Sent--[address] [place] [phone]--OHFGB-MSX [facility] HealthPending - Request [facility], [place] [phone]--RTMYT-RZN Hospitals Psychiatry Inpatient EFAXPending - Request Sent--[phone]--[facility] Health[facility]ending - Request Sent--[address] [facility] [address], [place] [phone]--[facility]-Behavioral Health Patient PlacementPending - Request Sent--[facility], [phone]--[facility] - No Request Sent--[address], [place] [phone]--PPHKF-EXMDY [facility] HealthcarePending - No Request [address], [place] [phone]--  Damita Dunnings, MSW, LCSW-A  9:46 AM 05/24/2023

## 2023-05-24 NOTE — ED Notes (Signed)
Inpt tx  IVC'd 05/21/23, exp 05/28/23; IVC docs in purple zone

## 2023-05-24 NOTE — ED Notes (Signed)
Pt is standing in the corner of his room and appears to be reacting to internal stimuli. Pt is refusing any and all medications at this time.

## 2023-05-24 NOTE — Progress Notes (Signed)
Inpatient Behavioral Health Placement  1809 on 05/23/23 CSW received a phone call from [facility] Intake, Elmarie Shiley and pt has been denied due to insurance and acuity.  LCSW Progress Note  [id number]   KALIK HOARE  05/24/2023  1:54 AM    Inpatient Behavioral Health Placement  Pt meets inpatient criteria per Phebe Colla, NP. There are no available beds within [facility] system per [facility] Brook McNichol,RN. Referral was sent to the following facilities;   Destination  Service Provider Address Phone Fax  [facility] [address], [place] [address] [phone]  [facility] [address] [place] [address] [phone]  [facility] Center-Adult [address] [place] [address] [phone]  [facility] [address] [place], [place] [address] [phone]  [facility] [address] [place] [address] [phone]  [facility] [address] [place] [address] [phone]  [facility] [address] [place] [address] [phone]  [facility] [address] [place] [address] [phone]  [facility] [address], [place] [address] [phone]  [facility] [address], [place] [address] [phone]  [facility] EFAX [address] [place], [place] [phone]  [facility] [address], [place] [address] [phone]  [facility] [address] [place], [place] [address] [phone]  [facility] [address] [place], [place] [address] [phone]  [facility] [address] [place] [address] [phone]  [facility] [address] [place], [place] [address] [phone]  [facility] [address], [place] [address] [phone]  [facility] [address] [place] [address] [phone]  [facility] [place] [phone]  [facility] [address] [place] [address] [phone]  [facility] [address] [place] [address] [phone]  [facility] [address] [place] [address] [phone]  [facility] [address] [place] [address] [phone]  [facility] [address], [place] [address] [phone]  [facility] [address] [place] [address] [phone]  [facility] Inpatient [facility] [place] [phone]  [facility] [address] [place] [address] [phone]  [facility] Health Patient Placement [facility], [place] [phone]  [facility] [address] [place] [address] [phone]  [facility] [address], [place] [address] [phone]    Situation ongoing,  CSW will follow up.    Maryjean Ka, MSW, LCSWA 05/24/2023 1:54 AM

## 2023-05-24 NOTE — ED Notes (Signed)
Pt eating lunch tray at this time
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
## 2023-05-24 NOTE — ED Notes (Signed)
Pt shadowboxing at wall in his room. Pt responding to internal stimuli. Pt irritated that this RN opened his door. Pt requesting water. Water given to pt.

## 2023-05-24 NOTE — ED Notes (Signed)
Performed mouth check after pt took meds. Did not note any meds being held in pt mouth at this time.

## 2023-05-25 DIAGNOSIS — F201 Disorganized schizophrenia: Secondary | ICD-10-CM | POA: Diagnosis not present

## 2023-05-25 MED ORDER — STERILE WATER FOR INJECTION IJ SOLN
INTRAMUSCULAR | Status: AC
  Administered 2023-05-25: 1.2 mL
  Filled 2023-05-25: qty 10

## 2023-05-25 MED ORDER — DIPHENHYDRAMINE HCL 50 MG/ML IJ SOLN
25.0000 mg | Freq: Once | INTRAMUSCULAR | Status: DC
  Filled 2023-05-25: qty 1

## 2023-05-25 MED ORDER — ZIPRASIDONE MESYLATE 20 MG IM SOLR
20.0000 mg | Freq: Once | INTRAMUSCULAR | Status: AC
  Administered 2023-05-25: 20 mg via INTRAMUSCULAR
  Filled 2023-05-25: qty 20
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
## 2023-05-25 NOTE — Progress Notes (Signed)
Patient has been denied by [facility] and has been faxed out. Patient meets BH inpatient criteria per Phebe Colla, NP. Patient has been faxed out to the following facilities:   [facility] [address], [address] [place] [address] [phone]  [facility] [address], [place] [address] [phone]  [facility] Center-Adult [address] [place] [address] [phone]  [facility] [address] [place], [place] [address] [phone]  [facility] [address] [place] [address] [phone]  [facility] [address] [place] [address] [phone]  [facility] [address] [place] [address] [phone]  [facility] [address], [place] [address] [phone]  [facility] [address] [place] [address] [phone]  [facility] [address], [place] [address] [phone]  [facility] EFAX [address] [place], [place] [phone]  [facility] [address] [place] [address] [phone]  [facility] [address] [place], [place] [address] [phone]  [facility] [place] [address] [place], [place] [address] [phone]  [facility] [address] [place] [address] [phone]  [facility] [address] [place], [place] [address] [phone]  [facility] [address], [place] [address] [phone]  [facility] [address], [place] [address] [phone]   [facility] [place] [phone]  [facility] [address] [place] [address] [phone]  [facility] [address] [place] [address] [phone]  [facility] [address] [place] [address] [phone]  [facility] [address], [place] [address] [phone]  [facility] [address], [place] [address] [phone]  [facility] [address] [place] [address] [phone]  [facility] [place] [phone]  [facility] [address], [place] [address] [phone]  [facility] Patient Placement [facility], [place] [phone]  [facility] [address], [place] [address] [phone]  [facility] [address], [place] [address] [phone]   Damita Dunnings, MSW, LCSW-A  11:43 AM 05/25/2023

## 2023-05-25 NOTE — ED Notes (Signed)
Pt refusing VS at this time. Will attempt later.

## 2023-05-25 NOTE — ED Notes (Signed)
Pt's father visiting at this time

## 2023-05-25 NOTE — ED Notes (Addendum)
Pt provided w/ 2 bottles of water after taking meds. Mouth check performed and no pills were noted in mouth.

## 2023-05-25 NOTE — ED Notes (Signed)
Pt observed shadowboxing towards the wall. Pt not hitting objects at this time.
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
## 2023-05-25 NOTE — ED Notes (Signed)
MD Preston Fleeting notified that patient refused PM medications; MD request to be notified with any change in behavior, for example, agitation.

## 2023-05-25 NOTE — ED Notes (Signed)
Pt laying on bed sleeping at this time in NAD.
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
## 2023-05-26 MED ORDER — LORAZEPAM 2 MG/ML IJ SOLN
2.0000 mg | Freq: Once | INTRAMUSCULAR | Status: AC
  Administered 2023-05-26: 2 mg via INTRAMUSCULAR
  Filled 2023-05-26: qty 1

## 2023-05-26 MED ORDER — LORAZEPAM 2 MG/ML IJ SOLN
2.0000 mg | Freq: Four times a day (QID) | INTRAMUSCULAR | Status: DC | PRN
  Administered 2023-05-26: 2 mg via INTRAMUSCULAR
  Filled 2023-05-26: qty 1

## 2023-05-26 MED ORDER — LORAZEPAM 1 MG PO TABS: 2.0000 mg | ORAL_TABLET | Freq: Four times a day (QID) | ORAL | Status: DC | PRN

## 2023-05-26 MED ORDER — NICOTINE 21 MG/24HR TD PT24
21.0000 mg | MEDICATED_PATCH | Freq: Every day | TRANSDERMAL | Status: DC
  Administered 2023-05-26 – 2023-05-27 (×2): 21 mg via TRANSDERMAL
  Filled 2023-05-26 (×2): qty 1

## 2023-05-26 NOTE — ED Provider Notes (Signed)
Emergency Medicine Observation Re-evaluation Note  Brendan Hogan is a 28 y.o. male, seen on rounds today.  Pt initially presented to the ED for complaints of IVC Currently, the patient is sleeping.  Physical Exam  BP 117/83 (BP Location: Left Arm)   Pulse 82   Temp 97.7 F (36.5 C) (Oral)   Resp 20   Ht 5\' 6"  (1.676 m)   Wt 68 kg   SpO2 100%   BMI 24.20 kg/m  Physical Exam General: sleeping Cardiac: regular Lungs: clear Psych: sleeping right now   ED Course / MDM  EKG:EKG Interpretation Date/Time:  Wednesday May 21 2023 21:22:57 EST Ventricular Rate:  132 PR Interval:  147 QRS Duration:  85 QT Interval:  283 QTC Calculation: 420 R Axis:   18  Text Interpretation: Sinus tachycardia Consider right atrial enlargement Repol abnrm suggests ischemia, anterolateral When compared with ECG of 12/11/2022, HEART RATE has increased Confirmed by Dione Booze ([id number]) on 05/22/2023 3:33:04 AM  I have reviewed the labs performed to date as well as medications administered while in observation.  Recent changes in the last 24 hours include none.  Plan  Current plan is for inpt placement per psych on re-evuation this morning.    Gwyneth Sprout, MD 05/26/23 1040
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
## 2023-05-26 NOTE — ED Notes (Signed)
Patient refuse Fluphenazine (Prolixin)

## 2023-05-26 NOTE — Progress Notes (Signed)
LCSW Progress Note  [id number]   CRISTIN SZATKOWSKI  05/26/2023  11:47 AM  Description:   Inpatient Psychiatric Referral  Patient was recommended inpatient per Eligha Bridegroom NP There are no available beds at [facility], per [facility] Rona Ravens RN. Patient was referred to the following out of network facilities:    Destination  Service Provider Address Phone Fax  [facility] [address], [place] [address] [phone]  [facility] [address] [place] [address] [phone]  [facility] Center-Adult [address] [place] [address] [phone]  [facility] [address] [place], [place] [address] [phone]  [facility] [address] [place] [address] [phone]  [facility] [address], [place] [address] [phone]  [facility] [address] [place] [address] [phone]  [facility] [address], [place] [address] [phone]  [facility] [address] [place] [address] [phone]  [facility] [address], [place] [address] [phone]  [facility] EFAX [address] [place], [place] [phone]  [facility] [address], [place] [address] [phone]  [facility] [address] [place], [place] [address] [phone]  [facility] [address] [place], [place] [address] [phone]  [facility] [address] [place] [address] [phone]  [facility] [address] [place], [place] [address]  [phone]  [facility] [address], [place] [address] [phone]  [facility] [address], [place] [address] [phone]  [facility] [place] [phone]  [facility] [address] [place] [address] [phone]  [facility] [address] [place] [address] [phone]  [facility] [address] [place] [address] [phone]  [facility] [address] [place] [address] [phone]  [facility] [address], [place] [address] [phone]  [facility] [address], [place] [address] [phone]  [facility] Psychiatry Inpatient [facility] [place] [phone]  [facility] [address] [place] [address] [phone]  [facility] Health Patient Placement [facility], [place] [phone]  [facility] [address], [place] [address] [phone]  [facility] [address], [place] [address] [phone]      Situation ongoing, CSW to continue following and update chart as more information becomes available.      [place] Erisha Paugh, MSW, LCSW  05/26/2023 11:47 AM
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
## 2023-05-26 NOTE — ED Notes (Signed)
All cups with unknown liquids removed from room by Sitter; [name] on unit for support;Pt still refusing urine sample; Pt also refused all night medications-Monique,RN

## 2023-05-27 ENCOUNTER — Other Ambulatory Visit: Payer: Self-pay

## 2023-05-27 ENCOUNTER — Inpatient Hospital Stay (HOSPITAL_COMMUNITY)
Admission: AD | Admit: 2023-05-27 | Discharge: 2023-06-05 | DRG: 885 | Disposition: A | Discharge status: Home / Self Care | Payer: PRIVATE HEALTH INSURANCE | Source: Intra-hospital | Attending: Psychiatry | Admitting: Psychiatry

## 2023-05-27 ENCOUNTER — Encounter (HOSPITAL_COMMUNITY): Payer: Self-pay | Admitting: Nurse Practitioner

## 2023-05-27 DIAGNOSIS — F419 Anxiety disorder, unspecified: Secondary | ICD-10-CM | POA: Diagnosis present

## 2023-05-27 DIAGNOSIS — R5383 Other fatigue: Secondary | ICD-10-CM | POA: Diagnosis present

## 2023-05-27 DIAGNOSIS — Z716 Tobacco abuse counseling: Secondary | ICD-10-CM | POA: Diagnosis not present

## 2023-05-27 DIAGNOSIS — Z888 Allergy status to other drugs, medicaments and biological substances status: Secondary | ICD-10-CM | POA: Diagnosis not present

## 2023-05-27 DIAGNOSIS — Z8249 Family history of ischemic heart disease and other diseases of the circulatory system: Secondary | ICD-10-CM

## 2023-05-27 DIAGNOSIS — Z560 Unemployment, unspecified: Secondary | ICD-10-CM | POA: Diagnosis not present

## 2023-05-27 DIAGNOSIS — F201 Disorganized schizophrenia: Secondary | ICD-10-CM | POA: Diagnosis present

## 2023-05-27 DIAGNOSIS — F1310 Sedative, hypnotic or anxiolytic abuse, uncomplicated: Secondary | ICD-10-CM | POA: Diagnosis present

## 2023-05-27 DIAGNOSIS — Z91148 Patient's other noncompliance with medication regimen for other reason: Secondary | ICD-10-CM

## 2023-05-27 DIAGNOSIS — F17210 Nicotine dependence, cigarettes, uncomplicated: Secondary | ICD-10-CM | POA: Diagnosis present

## 2023-05-27 MED ORDER — OLANZAPINE 5 MG PO TBDP: 5.0000 mg | ORAL_TABLET | Freq: Three times a day (TID) | ORAL | Status: DC | PRN

## 2023-05-27 MED ORDER — NICOTINE 21 MG/24HR TD PT24
21.0000 mg | MEDICATED_PATCH | Freq: Every day | TRANSDERMAL | Status: DC
  Administered 2023-06-02 – 2023-06-03 (×2): 21 mg via TRANSDERMAL
  Filled 2023-05-27 (×11): qty 1

## 2023-05-27 MED ORDER — HYDROXYZINE HCL 25 MG PO TABS
25.0000 mg | ORAL_TABLET | Freq: Three times a day (TID) | ORAL | Status: DC | PRN
  Filled 2023-05-27: qty 1

## 2023-05-27 MED ORDER — MAGNESIUM HYDROXIDE 400 MG/5ML PO SUSP: 30.0000 mL | Freq: Every day | ORAL | Status: DC | PRN

## 2023-05-27 MED ORDER — ALUM & MAG HYDROXIDE-SIMETH 200-200-20 MG/5ML PO SUSP: 30.0000 mL | ORAL | Status: DC | PRN

## 2023-05-27 MED ORDER — TRAZODONE HCL 50 MG PO TABS
50.0000 mg | ORAL_TABLET | Freq: Every evening | ORAL | Status: DC | PRN
  Administered 2023-06-04: 50 mg via ORAL

## 2023-05-27 MED ORDER — OLANZAPINE 10 MG IM SOLR: 5.0000 mg | Freq: Three times a day (TID) | INTRAMUSCULAR | Status: DC | PRN

## 2023-05-27 MED ORDER — ACETAMINOPHEN 325 MG PO TABS: 650.0000 mg | ORAL_TABLET | Freq: Four times a day (QID) | ORAL | Status: DC | PRN

## 2023-05-27 MED ORDER — FLUPHENAZINE HCL 5 MG PO TABS
5.0000 mg | ORAL_TABLET | Freq: Two times a day (BID) | ORAL | Status: DC
  Filled 2023-05-27 (×6): qty 1

## 2023-05-27 MED ORDER — OLANZAPINE 10 MG IM SOLR: 10.0000 mg | Freq: Three times a day (TID) | INTRAMUSCULAR | Status: DC | PRN

## 2023-05-27 MED ORDER — BENZTROPINE MESYLATE 1 MG PO TABS
1.0000 mg | ORAL_TABLET | Freq: Two times a day (BID) | ORAL | Status: DC
  Filled 2023-05-27 (×6): qty 1

## 2023-05-27 MED ORDER — DIVALPROEX SODIUM 500 MG PO DR TAB
500.0000 mg | DELAYED_RELEASE_TABLET | Freq: Two times a day (BID) | ORAL | Status: DC
  Filled 2023-05-27 (×6): qty 1

## 2023-05-27 MED ORDER — MIRTAZAPINE 15 MG PO TABS
15.0000 mg | ORAL_TABLET | Freq: Every day | ORAL | Status: DC
  Filled 2023-05-27 (×5): qty 1

## 2023-05-27 NOTE — ED Provider Notes (Signed)
Emergency Medicine Observation Re-evaluation Note  Brendan Hogan is a 28 y.o. male, seen on rounds today.  Pt initially presented to the ED for complaints of IVC Currently, the patient is resting.  Physical Exam  BP 124/83 (BP Location: Right Arm)   Pulse 78   Temp 98.6 F (37 C) (Oral)   Resp 18   Ht 5\' 6"  (1.676 m)   Wt 68 kg   SpO2 100%   BMI 24.20 kg/m  Physical Exam General: NAD Psych: no agitation  ED Course / MDM  EKG:EKG Interpretation Date/Time:  Wednesday May 21 2023 21:22:57 EST Ventricular Rate:  132 PR Interval:  147 QRS Duration:  85 QT Interval:  283 QTC Calculation: 420 R Axis:   18  Text Interpretation: Sinus tachycardia Consider right atrial enlargement Repol abnrm suggests ischemia, anterolateral When compared with ECG of 12/11/2022, HEART RATE has increased Confirmed by Dione Booze ([id number]) on 05/22/2023 3:33:04 AM  I have reviewed the labs performed to date as well as medications administered while in observation.  Recent changes in the last 24 hours include none pt admitted to [facility].  Plan  Current plan is for admit to [facility].    Ernie Avena, MD 05/27/23 [phone]

## 2023-05-27 NOTE — ED Notes (Signed)
Transport called for pt to [facility] by [name].

## 2023-05-27 NOTE — Group Note (Signed)
Date:  05/27/2023 Time:  8:28 PM  Group Topic/Focus:  Wrap-Up Group:   The focus of this group is to help patients review their daily goal of treatment and discuss progress on daily workbooks.    Participation Level:  Did Not Attend  Participation Quality:   Did Not Attend  Affect:   Did Not Attend  Cognitive:   Did Not Attend  Insight: None  Engagement in Group:   Did Not Attend  Modes of Intervention:   Did Not Attend  Additional Comments:  Pt was encouraged to attend wrap up group but did not attend  Felipa Furnace 05/27/2023, 8:28 PM

## 2023-05-27 NOTE — Plan of Care (Signed)
  Problem: Safety: Goal: Periods of time without injury will increase Outcome: Progressing   Problem: Education: Goal: Emotional status will improve Outcome: Not Progressing Goal: Mental status will improve Outcome: Not Progressing   Problem: Activity: Goal: Interest or engagement in activities will improve Outcome: Not Progressing   Problem: Education: Goal: Will be free of psychotic symptoms Outcome: Not Progressing Goal: Knowledge of the prescribed therapeutic regimen will improve Outcome: Not Progressing   Problem: Coping: Goal: Coping ability will improve Outcome: Not Progressing

## 2023-05-27 NOTE — Plan of Care (Signed)
Problem: Education: Goal: Emotional status will improve Outcome: Not Progressing Goal: Mental status will improve Outcome: Not Progressing

## 2023-05-27 NOTE — Progress Notes (Addendum)
Pt has been accepted to [facility] on 05/27/2023 Bed assignment: 508-1  Pt meets inpatient criteria per: Eligha Bridegroom NP  Attending Physician will be: Phineas Inches, MD   Report can be called to: - Child and Adolescence unit: [phone] -Adult unit: [phone]  Pt can arrive after UDS   Care Team Notified: [facility] Rona Ravens RN, Eligha Bridegroom NP, Lauren Styles RN, [place] Noe Fiscus RN,   [place] Briseis Aguilera LCSW-A   05/27/2023 11:00 AM

## 2023-05-27 NOTE — ED Notes (Signed)
Pt refusing to give a urine sample.
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
## 2023-05-27 NOTE — Tx Team (Signed)
Initial Treatment Plan 05/27/2023 1:46 PM Brendan Hogan:[id number]    PATIENT STRESSORS: Health problems   Marital or family conflict   Medication change or noncompliance     PATIENT STRENGTHS: Average or above average intelligence  Supportive family/friends    PATIENT IDENTIFIED PROBLEMS: Paranoia  Physical aggression towards family member  Insomnia  Medication noncompliance  Ineffective coping skills             DISCHARGE CRITERIA:  Ability to meet basic life and health needs Adequate post-discharge living arrangements  PRELIMINARY DISCHARGE PLAN: Attend aftercare/continuing care group Outpatient therapy Return to previous living arrangement  PATIENT/FAMILY INVOLVEMENT: This treatment plan has been presented to and reviewed with the patient, Brendan Hogan, and/or family member, .  The patient and family have been given the opportunity to ask questions and make suggestions.  Clarene Critchley, RN 05/27/2023, 1:46 PM

## 2023-05-27 NOTE — Progress Notes (Signed)
Pt refused HS medications "I don't take medicine" , pt informed of forced medication process , pt responded "I don't take shots"

## 2023-05-27 NOTE — Progress Notes (Signed)
LCSW Progress Note  [id number]   Brendan Hogan  05/27/2023  1:56 AM    Inpatient Behavioral Health Placement  Pt meets inpatient criteria per [facility]. There are no available beds within [facility] per [facility] Rona Ravens, RN. Referral was sent to the following facilities;   Destination  Service Provider Address Phone Fax  [facility] [address] [place] [address] [phone]  [facility] [address], [place] [address] [phone]  [facility] [address] [place] [address] [phone]  [facility] [address] [place], [place] [address] [phone]  [facility] [address] [place] [address] [phone]  [facility] [address] [place] [address] [phone]  [facility] [address] [place] [address] [phone]  [facility] [address] [place] [address] [phone]  [facility] [address] [place] [address] [phone]  [facility] [address] [place] [address] [phone]  [facility] EFAX [address] [place], [place] [phone]  [facility] [address] [place] [address] [phone]  [facility] [address] [place], [place] [address] [phone]  [facility] [address] [place], [place] [address] [phone]  [facility] [address] [place] [address] [phone]  [facility] [address] [place], [place] [address]  [phone]  [facility] [address], [place] [address] [phone]  [facility] [address] [place] [address] [phone]  [facility] [place] [phone]  [facility] [address] [place] [address] [phone]  [facility] [address] [place] [address] [phone]  [facility] [address], [place] [address] [phone]  [facility] [address], [place] [address] [phone]  [facility] [address], [place] [address] [phone]  [facility] [address] [place] [address] [phone]  [facility] [place] [phone]  [facility] [address] [place] [address] [phone]  [facility] Patient Placement [facility], [place] [phone]  [facility] [address], [place] [address] [phone]  [facility] [address] [place] [address] [phone]  [facility] [address] [place] [address] [phone]  [facility] [place] [address] [phone]  [facility] [place] [address] [phone]    Situation ongoing,  CSW will follow up.    Brendan Hogan, MSW, LCSWA 05/27/2023 1:56 AM

## 2023-05-27 NOTE — Progress Notes (Signed)
LCSW Progress Note  [id number]   ARISTON GRANDISON  05/27/2023  10:43 AM  Description:   Inpatient Psychiatric Referral  Patient was recommended inpatient per Eligha Bridegroom NP There are no available beds at [facility], per [facility] Rona Ravens RN. Patient was referred to the following out of network facilities:    Destination  Service Provider Address Phone Fax  [facility] [address] [place] [address] [phone]  [facility] [address], [place] [address] [phone]  [facility] [address] [place] [address] [phone]  [facility] [address] [place], [place] [address] [phone]  [facility] [address] [place] [address] [phone]  [facility] [address], [place] [address] [phone]  [facility] [address] [place] [address] [phone]  [facility] [address] [place] [address] [phone]  [facility] [address] [place] [address] [phone]  [facility] [address] [place] [address] [phone]  [facility] EFAX [address] [place], [place] [phone]  [facility] [address], [place] [address] [phone]  [facility] [address] [place], [place] [address] [phone]  [facility] [address] [place], [place] [address] [phone]  [facility] [address] [place] [address] [phone]  [facility] [address] [place], [place] [address]  [phone]  [facility] [address] [place] [address] [phone]  [facility] [address], [place] [address] [phone]  [facility] [place] [phone]  [facility] [address] [place] [address] [phone]  [facility] [address] [place] [address] [phone]  [facility] [address] [place] [address] [phone]  [facility] [address], [place] [address] [phone]  [facility] [address], [place] [address] [phone]  [facility] [address], [place] [address] [phone]  [facility] [place] [phone]  [facility] [address] [place] [address] [phone]  [facility] Patient Placement [facility], [place] [phone]  [facility] [address], [place] [address] [phone]  [facility] [address] [place] [address] [phone]  [facility] 1 medical [place] [address] [phone]  [facility] [place] [address] [phone]  [facility] Regino Bellow [place] [address] [phone]      Situation ongoing, CSW to continue following and update chart as more information becomes available.      [place] Tashia Leiterman, MSW, LCSW  05/27/2023 10:43 AM

## 2023-05-27 NOTE — Progress Notes (Signed)
   05/27/23 2000  Psych Admission Type (Psych Patients Only)  Admission Status Involuntary  Psychosocial Assessment  Patient Complaints Suspiciousness;Anxiety  Eye Contact Fair  Facial Expression Fixed smile;Animated;Anxious  Affect Anxious;Preoccupied  Speech Logical/coherent  Interaction Defensive;Isolative  Motor Activity Slow  Appearance/Hygiene In scrubs  Behavior Characteristics Guarded;Restless  Mood Suspicious;Preoccupied  Aggressive Behavior  Effect No apparent injury  Thought Process  Coherency Circumstantial;Blocking  Content Preoccupation  Delusions Paranoid  Perception Hallucinations  Hallucination Auditory  Judgment Impaired  Confusion WDL  Danger to Self  Current suicidal ideation? Denies  Danger to Others  Danger to Others None reported or observed

## 2023-05-28 ENCOUNTER — Encounter (HOSPITAL_COMMUNITY): Payer: Self-pay

## 2023-05-28 DIAGNOSIS — F201 Disorganized schizophrenia: Principal | ICD-10-CM

## 2023-05-28 MED ORDER — BENZTROPINE MESYLATE 0.5 MG PO TABS
0.5000 mg | ORAL_TABLET | Freq: Two times a day (BID) | ORAL | Status: DC
  Administered 2023-05-31 – 2023-06-05 (×11): 0.5 mg via ORAL
  Filled 2023-05-28 (×18): qty 1

## 2023-05-28 MED ORDER — FLUPHENAZINE HCL 2.5 MG/ML IJ SOLN
2.5000 mg | Freq: Two times a day (BID) | INTRAMUSCULAR | Status: DC
  Administered 2023-05-28 – 2023-05-30 (×4): 2.5 mg via INTRAMUSCULAR
  Filled 2023-05-28 (×8): qty 1
  Filled 2023-05-28: qty 10
  Filled 2023-05-28 (×10): qty 1

## 2023-05-28 MED ORDER — FLUPHENAZINE HCL 5 MG PO TABS
5.0000 mg | ORAL_TABLET | Freq: Two times a day (BID) | ORAL | Status: DC
  Filled 2023-05-28 (×2): qty 1

## 2023-05-28 MED ORDER — FLUPHENAZINE HCL 5 MG PO TABS
5.0000 mg | ORAL_TABLET | Freq: Two times a day (BID) | ORAL | Status: DC
  Administered 2023-05-30 – 2023-06-05 (×12): 5 mg via ORAL
  Filled 2023-05-28 (×18): qty 1

## 2023-05-28 MED ORDER — DIVALPROEX SODIUM 500 MG PO DR TAB
500.0000 mg | DELAYED_RELEASE_TABLET | Freq: Two times a day (BID) | ORAL | Status: DC
  Administered 2023-05-30 – 2023-06-05 (×12): 500 mg via ORAL
  Filled 2023-05-28 (×18): qty 1

## 2023-05-28 NOTE — Progress Notes (Signed)
Pt refused PO medications , but agreed to take IM , pt appears to believe he will  get a high from the shot    05/28/23 2000  Psych Admission Type (Psych Patients Only)  Admission Status Involuntary  Psychosocial Assessment  Patient Complaints Suspiciousness;Substance abuse  Eye Contact Fair  Facial Expression Fixed smile;Animated;Anxious  Affect Anxious;Preoccupied  Speech Logical/coherent  Interaction Defensive;Isolative  Motor Activity Slow  Appearance/Hygiene In scrubs  Behavior Characteristics Guarded;Fidgety;Resistant to care  Mood Suspicious;Preoccupied  Aggressive Behavior  Effect No apparent injury  Thought Process  Coherency Circumstantial;Blocking  Content Preoccupation  Delusions Paranoid  Perception Hallucinations  Hallucination Auditory  Judgment Impaired  Confusion WDL  Danger to Self  Current suicidal ideation? Denies  Danger to Others  Danger to Others None reported or observed

## 2023-05-28 NOTE — BH IP Treatment Plan (Signed)
Interdisciplinary Treatment and Diagnostic Plan Update  05/28/2023 Time of Session: [address] JASMON GRAFFAM MRN: [id number]  Principal Diagnosis: Disorganized schizophrenia [facility])  Secondary Diagnoses: Principal Problem:   Disorganized schizophrenia (HCC)   Current Medications:  Current Facility-Administered Medications  Medication Dose Route Frequency Provider Last Rate Last Admin   acetaminophen (TYLENOL) tablet 650 mg  650 mg Oral Q6H PRN Eligha Bridegroom, NP       alum & mag hydroxide-simeth (MAALOX/MYLANTA) 200-200-20 MG/5ML suspension 30 mL  30 mL Oral Q4H PRN Eligha Bridegroom, NP       benztropine (COGENTIN) tablet 0.5 mg  0.5 mg Oral BID Massengill, Harrold Donath, MD       divalproex (DEPAKOTE) DR tablet 500 mg  500 mg Oral Q12H Massengill, Nathan, MD       fluPHENAZine (PROLIXIN) tablet 5 mg  5 mg Oral Q12H Massengill, Nathan, MD       hydrOXYzine (ATARAX) tablet 25 mg  25 mg Oral TID PRN Eligha Bridegroom, NP       magnesium hydroxide (MILK OF MAGNESIA) suspension 30 mL  30 mL Oral Daily PRN Eligha Bridegroom, NP       mirtazapine (REMERON) tablet 15 mg  15 mg Oral QHS Eligha Bridegroom, NP       nicotine (NICODERM CQ - dosed in mg/24 hours) patch 21 mg  21 mg Transdermal Daily Eligha Bridegroom, NP       OLANZapine (ZYPREXA) injection 10 mg  10 mg Intramuscular TID PRN Eligha Bridegroom, NP       OLANZapine (ZYPREXA) injection 5 mg  5 mg Intramuscular TID PRN Eligha Bridegroom, NP       OLANZapine zydis (ZYPREXA) disintegrating tablet 5 mg  5 mg Oral TID PRN Eligha Bridegroom, NP       traZODone (DESYREL) tablet 50 mg  50 mg Oral QHS PRN Eligha Bridegroom, NP       PTA Medications: No medications prior to admission.    Patient Stressors: Health problems   Marital or family conflict   Medication change or noncompliance    Patient Strengths: Average or above average intelligence  Supportive family/friends   Treatment Modalities: Medication Management, Group therapy, Case management,  1  to 1 session with clinician, Psychoeducation, Recreational therapy.   Physician Treatment Plan for Primary Diagnosis: Disorganized schizophrenia (HCC) Long Term Goal(s):     Short Term Goals:    Medication Management: Evaluate patient's response, side effects, and tolerance of medication regimen.  Therapeutic Interventions: 1 to 1 sessions, Unit Group sessions and Medication administration.  Evaluation of Outcomes: Not Progressing  Physician Treatment Plan for Secondary Diagnosis: Principal Problem:   Disorganized schizophrenia (HCC)  Long Term Goal(s):     Short Term Goals:       Medication Management: Evaluate patient's response, side effects, and tolerance of medication regimen.  Therapeutic Interventions: 1 to 1 sessions, Unit Group sessions and Medication administration.  Evaluation of Outcomes: Not Progressing   RN Treatment Plan for Primary Diagnosis: Disorganized schizophrenia (HCC) Long Term Goal(s): Knowledge of disease and therapeutic regimen to maintain health will improve  Short Term Goals: Ability to demonstrate self-control, Ability to participate in decision making will improve, Ability to verbalize feelings will improve, Ability to identify and develop effective coping behaviors will improve, and Compliance with prescribed medications will improve  Medication Management: RN will administer medications as ordered by provider, will assess and evaluate patient's response and provide education to patient for prescribed medication. RN will report any adverse and/or side effects to  prescribing provider.  Therapeutic Interventions: 1 on 1 counseling sessions, Psychoeducation, Medication administration, Evaluate responses to treatment, Monitor vital signs and CBGs as ordered, Perform/monitor CIWA, COWS, AIMS and Fall Risk screenings as ordered, Perform wound care treatments as ordered.  Evaluation of Outcomes: Not Progressing   LCSW Treatment Plan for Primary  Diagnosis: Disorganized schizophrenia (HCC) Long Term Goal(s): Safe transition to appropriate next level of care at discharge, Engage patient in therapeutic group addressing interpersonal concerns.  Short Term Goals: Engage patient in aftercare planning with referrals and resources, Increase social support, Increase ability to appropriately verbalize feelings, Increase emotional regulation, Facilitate acceptance of mental health diagnosis and concerns, Identify triggers associated with mental health/substance abuse issues, and Increase skills for wellness and recovery  Therapeutic Interventions: Assess for all discharge needs, 1 to 1 time with Social worker, Explore available resources and support systems, Assess for adequacy in community support network, Educate family and significant other(s) on suicide prevention, Complete Psychosocial Assessment, Interpersonal group therapy.  Evaluation of Outcomes: Not Progressing   Progress in Treatment: Attending groups: No. Participating in groups: No. and As evidenced by:  per chart review Pt has attended 0 of 2 groups  Taking medication as prescribed: No. Toleration medication: No. and As evidenced by:  need to restart medications Family/Significant other contact made: No, will contact:  Pt declined consent for collateral contact Patient understands diagnosis: No. and As evidenced by:  lack of insight regarding mental health symptoms and behaviors Discussing patient identified problems/goals with staff: Yes. Medical problems stabilized or resolved: Yes. Denies suicidal/homicidal ideation: Yes. Issues/concerns per patient self-inventory: No. Other: None  New problem(s) identified: No, Describe:  None  New Short Term/Long Term Goal(s): medication stabilization, elimination of SI thoughts, development of comprehensive mental wellness plan.   Patient Goals:  "All of my goals are outside of here"  Discharge Plan or Barriers: Patient recently  admitted. CSW will continue to follow and assess for appropriate referrals and possible discharge planning.    Reason for Continuation of Hospitalization: Medication stabilization  Estimated Length of Stay: 5-7 Days  Last 3 [place] Suicide Severity Risk Score: Flowsheet Row [facility]  C-SSRS RISK CATEGORY No Risk No Risk No Risk       Last [facility] 2/9 Scores:    07/11/2022   11:18 AM 06/27/2022    2:09 PM 02/18/2022   12:20 PM  Depression screen PHQ 2/9  Decreased Interest 0 1 0  Down, Depressed, Hopeless 1 0 0  PHQ - 2 Score 1 1 0  Altered sleeping 3 2 0  Tired, decreased energy 2 2 0  Change in appetite 1 1 0  Feeling bad or failure about yourself  0 0 0  Trouble concentrating 3 1 0  Moving slowly or fidgety/restless 3 2 0  Suicidal thoughts 0 0 0  PHQ-9 Score 13 9 0  Difficult doing work/chores   Not difficult at all    Scribe for Treatment Team: Jacinta Shoe, LCSW 05/28/2023 3:29 PM

## 2023-05-28 NOTE — Progress Notes (Signed)
[facility] Second Physician Opinion Progress Note for Medication Administration to Non-consenting Patients (For Involuntarily Committed Patients)  Patient: CHRISTOP HIPPERT Date of Birth: [birth year] MRN: [id number]  Reason for the Medication: The patient, without the benefit of the specific treatment measure, is incapable of participating in any available treatment plan that will give the patient a realistic opportunity of improving the patient's condition. There is, without the benefit of the specific treatment measure, a significant possibility that the patient will harm self or others before improvement of the patient's condition is realized.  Consideration of Side Effects: Consideration of the side effects related to the medication plan has been given.  Rationale for Medication Administration:  28 year old African-American male with history of schizoaffective disorder bipolar type.  Patient is involuntarily committed by his family on account of mania and psychosis.  He does not have any insight into his illness.  He feels like there is not around with him.  He is dismissive of the events that led to his psychiatric hospitalization.  He has chronically not adhered to treatment.  He has represented repeatedly from effects of mania and psychosis. Patient is a risk to other people as he tends to get violent if not medicated.  Unfortunately his mental illness is affecting his decision making process.  I agree with treatment team to force medication as a way of mitigating risk to others and property.    Georgiann Cocker, MD 05/28/23  2:39 PM   This documentation is good for (7) seven days from the date of the MD signature. New documentation must be completed every seven (7) days with detailed justification in the medical record if the patient requires continued non-emergent administration of psychotropic medications.

## 2023-05-28 NOTE — Progress Notes (Signed)
     05/28/2023       9:55 AM   Marlo Celestia Khat   Type of Note: Engineer, production was faxed and uploaded/filed to the courts to excuse pt from court hearing scheduled for tomorrow, 05/29/23.  Signed:  Noelle Hoogland, LCSW-A 05/28/2023  9:55 AM

## 2023-05-28 NOTE — Progress Notes (Addendum)
Patient refused medication offered up to this time on shift, stating" I'm not taking any medication I'm good without it".  Pt refused to provide urine sample. Physician notified of medication and urinalysis refusal. Pt observed to be isolative to room earlier on shift then attended 1500 group therapy in the gym. Appetite good on shift, pt able to go to the cafeteria. Minimal interaction observed with peers. Safety maintained.  05/28/23 0910  Psych Admission Type (Psych Patients Only)  Admission Status Involuntary  Psychosocial Assessment  Patient Complaints Suspiciousness;Anxiety  Eye Contact Fair  Facial Expression Anxious;Animated  Affect Anxious;Preoccupied  Speech Logical/coherent  Interaction Defensive;Isolative  Motor Activity Slow  Appearance/Hygiene In scrubs  Behavior Characteristics Guarded;Resistant to care  Mood Preoccupied;Suspicious  Aggressive Behavior  Effect No apparent injury  Thought Process  Coherency Circumstantial;Blocking  Content Preoccupation  Delusions Paranoid  Perception Hallucinations (Pt denies hallucination, observed responding.)  Hallucination Auditory  Judgment Impaired  Confusion None  Danger to Self  Current suicidal ideation? Denies  Danger to Others  Danger to Others None reported or observed
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
## 2023-05-28 NOTE — Plan of Care (Signed)
Problem: Physical Regulation: Goal: Ability to maintain clinical measurements within normal limits will improve Outcome: Progressing   Problem: Safety: Goal: Periods of time without injury will increase Outcome: Progressing

## 2023-05-28 NOTE — [facility] Group Notes (Signed)
[facility] Group Notes:  (Nursing/MHT/Case Management/Adjunct)  Date:  05/28/2023  Time:  10:26 PM  Type of Therapy:  Group Therapy  Participation Level:  Active  Participation Quality:  Appropriate  Affect:  Appropriate  Cognitive:  Appropriate  Insight:  Appropriate  Engagement in Group:  Engaged  Modes of Intervention:  Discussion  Summary of Progress/Problems:Pt attended wrap up group explained a little about what the doctor had said to him regarding his medications and he said that every day for him is a positive day.  Tacy Dura 05/28/2023, 10:26 PM

## 2023-05-28 NOTE — Plan of Care (Signed)
  Problem: Education: Goal: Emotional status will improve Outcome: Progressing   Problem: Activity: Goal: Interest or engagement in activities will improve Outcome: Progressing Goal: Sleeping patterns will improve Outcome: Progressing   Problem: Safety: Goal: Periods of time without injury will increase Outcome: Progressing   Problem: Education: Goal: Mental status will improve Outcome: Not Progressing Goal: Verbalization of understanding the information provided will improve Outcome: Not Progressing

## 2023-05-28 NOTE — Group Note (Signed)
Recreation Therapy Group Note   Group Topic:Relaxation  Group Date: 05/28/2023 Start Time: 1015 End Time: 1050 Facilitators: Carlee Tesfaye-McCall, LRT,CTRS Location: 500 Hall Dayroom   Group Topic: Relaxation  Goal Area(s) Addresses:  Patient will identify positive relaxation techniques. Patient will identify benefits of using relaxation post d/c.  Intervention: Relaxation, Music  Activity: Music. Patients and LRT discussed how music can be used for relaxation. Patients were then allowed to pick the songs of their choosing they like to listen to for relaxation. Patients could sing along or move around to vibrations of the music. Patients also shared what music means to them.   Education:  Stress Management, Discharge Planning.   Education Outcome: Acknowledges Education   Affect/Mood: N/A   Participation Level: Did not attend    Clinical Observations/Individualized Feedback:      Plan: Continue to engage patient in RT group sessions 2-3x/week.   Rahshawn Remo-McCall, LRT,CTRS 05/28/2023 11:28 AM
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
## 2023-05-28 NOTE — Progress Notes (Signed)
   05/28/23 0545  15 Minute Checks  Location Bedroom  Visual Appearance Calm  Behavior Composed  Sleep (Behavioral Health Patients Only)  Calculate sleep? (Click Yes once per 24 hr at 0600 safety check) Yes  Documented sleep last 24 hours 1.5

## 2023-05-28 NOTE — [facility] Suicide Risk Assessment (Signed)
Suicide Risk Assessment  Admission Assessment    [facility] Admission Suicide Risk Assessment   Nursing information obtained from:  Patient Demographic factors:  Male, Unemployed Current Mental Status:  NA Loss Factors:  NA Historical Factors:  NA Risk Reduction Factors:  Living with another person, especially a relative  Total Time spent with patient: 1 hour Principal Problem: Disorganized schizophrenia (HCC) Diagnosis:  Principal Problem:   Disorganized schizophrenia (HCC)  Subjective Data: Family concerns about mental health. Stopped taking psychotropic medication for 8 months.   Continued Clinical Symptoms:    The "Alcohol Use Disorders Identification Test", Guidelines for Use in Primary Care, Second Edition.  World Science writer [facility]). Score between 0-7:  no or low risk or alcohol related problems. Score between 8-15:  moderate risk of alcohol related problems. Score between 16-19:  high risk of alcohol related problems. Score 20 or above:  warrants further diagnostic evaluation for alcohol dependence and treatment.   CLINICAL FACTORS:   Schizophrenia:   Paranoid or undifferentiated type More than one psychiatric diagnosis Currently Psychotic   Musculoskeletal: Strength & Muscle Tone: within normal limits Gait & Station: normal Patient leans: N/A  Psychiatric Specialty Exam:  Presentation  General Appearance:  Casual; Fairly Groomed  Eye Contact: Fair  Speech: Clear and Coherent  Speech Volume: Normal  Handedness: Right   Mood and Affect  Mood: Euthymic  Affect: Labile   Thought Process  Thought Processes: Linear  Descriptions of Associations:Intact  Orientation:Full (Time, Place and Person)  Thought Content:Paranoid Ideation  History of Schizophrenia/Schizoaffective disorder:Yes  Duration of Psychotic Symptoms:Greater than six months  Hallucinations:Hallucinations: None  Ideas of Reference:Delusions  Suicidal Thoughts:Suicidal  Thoughts: No  Homicidal Thoughts:Homicidal Thoughts: No   Sensorium  Memory: Immediate Fair  Judgment: Poor  Insight: Shallow   Executive Functions  Concentration: Fair  Attention Span: Fair  Recall: Fair  Fund of Knowledge: Fair  Language: Fair   Psychomotor Activity  Psychomotor Activity:Psychomotor Activity: Normal   Assets  Assets: Housing; Physical Health; Social Support   Sleep  Sleep:Sleep: Fair    Physical Exam: Physical Exam See H&P  ROS See H&P Blood pressure 116/82, pulse 95, temperature 98 F (36.7 C), temperature source Oral, resp. rate 18, height 5\' 5"  (1.651 m), weight 58.5 kg, SpO2 100%. Body mass index is 21.47 kg/m.   COGNITIVE FEATURES THAT CONTRIBUTE TO RISK:  None    SUICIDE RISK:   Minimal: No identifiable suicidal ideation.  Patients presenting with no risk factors but with morbid ruminations; may be classified as minimal risk based on the severity of the depressive symptoms  PLAN OF CARE: See H&P   I certify that inpatient services furnished can reasonably be expected to improve the patient's condition.   Norma Fredrickson, NP 05/28/2023, 11:26 PM

## 2023-05-29 DIAGNOSIS — F201 Disorganized schizophrenia: Secondary | ICD-10-CM | POA: Diagnosis not present

## 2023-05-29 NOTE — Group Note (Signed)
Recreation Therapy Group Note   Group Topic:Goal Setting  Group Date: 05/29/2023 Start Time: 1009 End Time: 1040 Facilitators: Lela Gell-McCall, LRT,CTRS Location: [address] Dayroom   Group Topic: Coping Skills  Goal Area(s) Addresses:  Patient will successfully identify goals they wish to accomplish post d/c. Patient will successfully identify what obstacles a preventing completion of goals.  Intervention: Worksheet, Theme park manager  Activity: DREAM. LRT and patients discussed the importance of goals and why they are important. Patients were then given a worksheet with DREAM on it. Inside the letters, patients were to write  what they want out of life and what they want to be. On the outside of the letters, patients were to write what is preventing them from obtaining their dreams/goals.   Education: Pharmacologist, Building control surveyor.   Education Outcome: Acknowledges understanding/In group clarification offered/Needs additional education.    Affect/Mood: N/A   Participation Level: Did not attend    Clinical Observations/Individualized Feedback:      Plan: Continue to engage patient in RT group sessions 2-3x/week.   Lyric Hoar-McCall, LRT,CTRS 05/29/2023 11:25 AM

## 2023-05-29 NOTE — Group Note (Signed)
Date:  05/29/2023 Time:  8:41 PM  Group Topic/Focus:  Wrap-Up Group:   The focus of this group is to help patients review their daily goal of treatment and discuss progress on daily workbooks.    Participation Level:  Active  Participation Quality:  Appropriate and Attentive  Affect:  Appropriate  Cognitive:  Alert and Appropriate  Insight: Appropriate and Good  Engagement in Group:  Engaged  Modes of Intervention:  Discussion and Education  Additional Comments:  Pt attended and participated in wrap up group this evening and rated their day a 6/10. Pt stated that they slept most of the day, and states that they had to leave group earlier due to the speaker "boring them". Pt goal tomorrow is to leave the unit and participate in treatment more.   Chrisandra Netters 05/29/2023, 8:41 PM

## 2023-05-29 NOTE — Progress Notes (Signed)
   05/29/23 1000  Psych Admission Type (Psych Patients Only)  Admission Status Involuntary  Psychosocial Assessment  Patient Complaints Suspiciousness  Eye Contact Fair  Facial Expression Anxious  Affect Preoccupied  Speech Logical/coherent  Interaction Isolative  Motor Activity Slow  Appearance/Hygiene In scrubs  Behavior Characteristics Resistant to care  Mood Preoccupied  Thought Process  Coherency Circumstantial  Content Preoccupation  Delusions Paranoid  Perception Hallucinations  Hallucination Auditory  Judgment Impaired  Confusion None  Danger to Self  Current suicidal ideation? Denies  Danger to Others  Danger to Others None reported or observed

## 2023-05-29 NOTE — Plan of Care (Signed)
  Problem: Activity: Goal: Interest or engagement in activities will improve Outcome: Progressing   Problem: Safety: Goal: Periods of time without injury will increase Outcome: Progressing   Problem: Education: Goal: Emotional status will improve Outcome: Not Progressing Goal: Mental status will improve Outcome: Not Progressing   Problem: Activity: Goal: Sleeping patterns will improve Outcome: Not Progressing

## 2023-05-29 NOTE — Plan of Care (Signed)
  Problem: Activity: Goal: Interest or engagement in activities will improve Outcome: Not Progressing  Problem: Coping: Goal: Ability to verbalize frustrations and anger appropriately will improve Outcome: Progressing   Problem: Safety: Goal: Periods of time without injury will increase Outcome: Progressing   Problem: Nutritional: Goal: Ability to achieve adequate nutritional intake will improve Outcome: Progressing

## 2023-05-29 NOTE — Progress Notes (Signed)
   05/29/23 2015  Psych Admission Type (Psych Patients Only)  Admission Status Other (Comment)  Psychosocial Assessment  Patient Complaints Suspiciousness;Anxiety  Eye Contact Fair  Facial Expression Fixed smile;Animated;Anxious  Affect Anxious;Preoccupied  Speech Logical/coherent  Interaction Defensive;Isolative  Motor Activity Slow  Appearance/Hygiene In scrubs  Behavior Characteristics Resistant to care  Mood Preoccupied  Aggressive Behavior  Effect No apparent injury  Thought Process  Coherency Circumstantial;Blocking  Content Preoccupation  Delusions Paranoid  Perception Hallucinations  Hallucination Auditory  Judgment Impaired  Confusion WDL  Danger to Self  Current suicidal ideation? Denies  Danger to Others  Danger to Others None reported or observed

## 2023-05-29 NOTE — Progress Notes (Signed)
Recreation Therapy Notes  1241: LRT attempted to complete recreation therapy assessment with patient. Patient was asleep, LRT attempted to wake patient to no avail. LRT will attempt to complete assessment at later time.    Shay Bartoli-McCall, LRT,CTRS Shailah Gibbins A Amiliana Foutz-McCall 05/29/2023 12:42 PM

## 2023-05-29 NOTE — [facility] Group Notes (Signed)
Adult Psychoeducational Group Note  Date:  05/29/2023 Time:  1:59 PM  Group Topic/Focus:  Goals Group:   The focus of this group is to help patients establish daily goals to achieve during treatment and discuss how the patient can incorporate goal setting into their daily lives to aide in recovery. Orientation:   The focus of this group is to educate the patient on the purpose and policies of crisis stabilization and provide a format to answer questions about their admission.  The group details unit policies and expectations of patients while admitted.  Participation Level:  Did Not Attend  Participation Quality:    Affect:    Cognitive:    Insight:   Engagement in Group:    Modes of Intervention:    Additional Comments:    Sheran Lawless 05/29/2023, 1:59 PM

## 2023-05-29 NOTE — Progress Notes (Signed)
[facility] MD Progress Note  05/29/2023 5:37 PM Brendan Hogan  MRN:  [id number]  Subjective:   28 year old male, with a psychiatric history significant for polysubstance abuse, psychosis, schizophrenia, and multiple psychiatric hospitalizations. According to family reports, over the past week, he has exhibited decreased oral intake, increased self-talking, and escalating aggression. It has been more than a week since he last took his psychotropic medications. The family further reported that he became combative at home, prompting them to contact law enforcement. Upon police arrival, the patient continued to display combative behavior, leading to his transport to [name], ED on January 22 under involuntary commitment (IVC).   Daily notes: Brendan Hogan is seen, chart reviewed. The chart findings discussed with the treatment team. He presents alert, oriented x 3. He is seen in his room. He presents with a good affect, good eye contact & verbally responsive. He reports, "I'm doing well. I don't feel depressed or anxious. Today is my third day in this hospital. The police brought me here, because sometimes, people just do not want you where you are. I'm doing well on my medicines, I have no side effects. I did attend group session yesterday. During the group meeting, we all shared what our day look like". The staff reports indicated that patient has the the tendency to refuse his medications, as a result, patient is on a forced medication order. This means he will receive IM injection of his main medication if he refuses to take it by mouth. Staff reports that patient can display some suspicious ideations during medication administration time & will refuse to take the medicine. That could be the reason the IM injection of Prolixin was given to him earlier today. A review of his MAR showed patient refused all his po medications scheduled for this morning & has to be given IM Prolixin 2.5 mg IM around 09:24 am earlier  today. Patient apparently has a DUI court date that is concerning to his father, worried about him missing the court date. The SW is aware of this. At this time, patient remains in no apparent distress. Will continue current plan of care as already in progress.  Principal Problem: Disorganized schizophrenia (HCC)  Diagnosis: Principal Problem:   Disorganized schizophrenia (HCC)  Total Time spent with patient: 45 minutes  Past Psychiatric History: See H&P.  Past Medical History:  Past Medical History:  Diagnosis Date   Psychosis (HCC) 05/2019   [facility] [place]    Seasonal allergies     Past Surgical History:  Procedure Laterality Date   TONSILLECTOMY     Family History:  Family History  Problem Relation Age of Onset   Hypertension Mother    Family Psychiatric  History:   Social History:  Social History   Substance and Sexual Activity  Alcohol Use Not Currently   Comment: rare     Social History   Substance and Sexual Activity  Drug Use No    Social History   Socioeconomic History   Marital status: Single    Spouse name: Not on file   Number of children: Not on file   Years of education: Not on file   Highest education level: Not on file  Occupational History   Not on file  Tobacco Use   Smoking status: Every Day    Current packs/day: 1.00    Types: Cigarettes   Smokeless tobacco: Former  Building services engineer status: Never Used  Substance and Sexual Activity  Alcohol use: Not Currently    Comment: rare   Drug use: No   Sexual activity: Not Currently  Other Topics Concern   Not on file  Social History Narrative   Not on file   Social Drivers of Health   Financial Resource Strain: Not on file  Food Insecurity: Patient Declined (05/27/2023)   Hunger Vital Sign    Worried About Running Out of Food in the Last Year: Patient declined    Ran Out of Food in the Last Year: Patient declined  Transportation Needs: Patient Declined (05/27/2023)    PRAPARE - Administrator, Civil Service (Medical): Patient declined    Lack of Transportation (Non-Medical): Patient declined  Physical Activity: Not on file  Stress: Not on file  Social Connections: Not on file   Additional Social History:   Sleep: Good  Appetite:  Good  Current Medications: Current Facility-Administered Medications  Medication Dose Route Frequency Provider Last Rate Last Admin   acetaminophen (TYLENOL) tablet 650 mg  650 mg Oral Q6H PRN Eligha Bridegroom, NP       alum & mag hydroxide-simeth (MAALOX/MYLANTA) 200-200-20 MG/5ML suspension 30 mL  30 mL Oral Q4H PRN Eligha Bridegroom, NP       benztropine (COGENTIN) tablet 0.5 mg  0.5 mg Oral BID Massengill, Harrold Donath, MD       divalproex (DEPAKOTE) DR tablet 500 mg  500 mg Oral Q12H Massengill, Nathan, MD       fluPHENAZine (PROLIXIN) tablet 5 mg  5 mg Oral Q12H Massengill, Nathan, MD       Or   fluPHENAZine (PROLIXIN) injection 2.5 mg  2.5 mg Intramuscular Q12H Massengill, Harrold Donath, MD   2.5 mg at 05/29/23 0981   hydrOXYzine (ATARAX) tablet 25 mg  25 mg Oral TID PRN Eligha Bridegroom, NP       magnesium hydroxide (MILK OF MAGNESIA) suspension 30 mL  30 mL Oral Daily PRN Eligha Bridegroom, NP       mirtazapine (REMERON) tablet 15 mg  15 mg Oral QHS Eligha Bridegroom, NP       nicotine (NICODERM CQ - dosed in mg/24 hours) patch 21 mg  21 mg Transdermal Daily Eligha Bridegroom, NP       OLANZapine (ZYPREXA) injection 10 mg  10 mg Intramuscular TID PRN Eligha Bridegroom, NP       OLANZapine (ZYPREXA) injection 5 mg  5 mg Intramuscular TID PRN Eligha Bridegroom, NP       OLANZapine zydis (ZYPREXA) disintegrating tablet 5 mg  5 mg Oral TID PRN Eligha Bridegroom, NP       traZODone (DESYREL) tablet 50 mg  50 mg Oral QHS PRN Eligha Bridegroom, NP        Lab Results: No results found for this or any previous visit (from the past 48 hours).  Blood Alcohol level:  Lab Results  Component Value Date   ETH <10 05/21/2023   ETH  <10 08/18/2022    Metabolic Disorder Labs: Lab Results  Component Value Date   HGBA1C 5.3 12/09/2022   MPG 105 12/09/2022   MPG 108 05/02/2022   Lab Results  Component Value Date   PROLACTIN 26.5 (H) 09/08/2019   Lab Results  Component Value Date   CHOL 150 12/09/2022   TRIG 95 12/09/2022   HDL 40 (L) 12/09/2022   CHOLHDL 3.8 12/09/2022   VLDL 19 12/09/2022   LDLCALC 91 12/09/2022   LDLCALC 96 05/02/2022    Physical Findings: AIMS:  , ,  ,  ,  CIWA:    COWS:     Musculoskeletal: Strength & Muscle Tone: within normal limits Gait & Station: normal Patient leans: N/A  Psychiatric Specialty Exam:  Presentation  General Appearance:  Casual; Fairly Groomed  Eye Contact: Fair  Speech: Clear and Coherent  Speech Volume: Normal  Handedness: Right   Mood and Affect  Mood: Euthymic  Affect: Labile   Thought Process  Thought Processes: Linear  Descriptions of Associations:Intact  Orientation:Full (Time, Place and Person)  Thought Content:Paranoid Ideation  History of Schizophrenia/Schizoaffective disorder:Yes  Duration of Psychotic Symptoms:Greater than six months  Hallucinations:Hallucinations: None  Ideas of Reference:Delusions  Suicidal Thoughts:Suicidal Thoughts: No  Homicidal Thoughts:Homicidal Thoughts: No   Sensorium  Memory: Immediate Fair  Judgment: Poor  Insight: Shallow   Executive Functions  Concentration: Fair  Attention Span: Fair  Recall: Fair  Fund of Knowledge: Fair  Language: Fair   Psychomotor Activity  Psychomotor Activity: Psychomotor Activity: Normal   Assets  Assets: Housing; Physical Health; Social Support   Sleep  Sleep: Sleep: Fair   Physical Exam: Physical Exam Vitals and nursing note reviewed.  Cardiovascular:     Rate and Rhythm: Normal rate.     Pulses: Normal pulses.  Pulmonary:     Effort: Pulmonary effort is normal.  Genitourinary:    Comments:  Deferred Musculoskeletal:        General: Normal range of motion.     Cervical back: Normal range of motion.  Skin:    General: Skin is warm and dry.  Neurological:     General: No focal deficit present.     Mental Status: He is alert and oriented to person, place, and time.    Review of Systems  Constitutional:  Negative for chills, diaphoresis and fever.  HENT:  Negative for congestion and sore throat.   Respiratory:  Negative for cough, shortness of breath and wheezing.   Cardiovascular:  Negative for chest pain and palpitations.  Gastrointestinal:  Negative for abdominal pain, constipation, diarrhea, heartburn, nausea and vomiting.  Musculoskeletal:  Negative for joint pain and myalgias.  Neurological:  Negative for dizziness, tingling, tremors, sensory change, speech change, focal weakness, seizures, loss of consciousness, weakness and headaches.  Endo/Heme/Allergies:        Allergies: Haldol [haloperidol], Fluphenazine, Geodon [ziprasidone]         Blood pressure (!) 108/59, pulse 88, temperature 98.6 F (37 C), resp. rate 17, height 5\' 5"  (1.651 m), weight 58.5 kg, SpO2 100%. Body mass index is 21.47 kg/m.   Treatment Plan Summary: Daily contact with patient to assess and evaluate symptoms and progress in treatment and Medication management.   PLAN: Safety and Monitoring:             -- Inoluntary admission to inpatient psychiatric unit for safety, stabilization and treatment             -- Daily contact with patient to assess and evaluate symptoms and progress in treatment             -- Patient's case to be discussed in multi-disciplinary team meeting             -- Observation Level : q15 minute checks             -- Vital signs:  q12 hours             -- Precautions: suicide, elopement, and assault   2. Psychiatric Diagnoses and Treatment:  Schizophrenia   -- Continue Cogentin 0.5 mg, oral, 2 times daily for prevention of eps. --  Continue Depakote DR, 500 mg po Q 12 hours -- Continue Prolixin 5 mg, oral, every 12 hours -- Continue mirtazapine 15 mg, oral, Daily at bedtime -- Continue hydroxyzine 25 mg, oral, 3 times daily as needed, anxiety -- Continue trazodone 50 mg, oral, at bedtime as needed, sleep    Involuntary Medication Order (give IM if patient refuses oral) -- Prolixin 5 mg, oral, every 12 hours OR  -- Prolixin injection 2.5 mg, IM, every 12 hours     Continue Agitation Protocol as ordered -- Olanzapine disintegrating tablet 5 mg, oral, 3 times daily as needed, mild agitation -- Olanzapine injection 5 mg, IM, 3 times daily, as needed, moderate agitation -- Olanzapine injection 10 mg, IM, 3 times daily as needed, severe agitation     --  The risks/benefits/side-effects/alternatives to this medication were discussed in detail with the patient and time was given for questions. The patient consents to medication trial.  -- FDA             -- Metabolic profile and EKG monitoring obtained while on an atypical antipsychotic (BMI: Lipid Panel: HbgA1c: QTc:)              -- Encouraged patient to participate in unit milieu and in scheduled group therapies   Armandina Stammer, NP, pmhnp, fnp-bc. 05/29/2023, 5:37 PM

## 2023-05-30 DIAGNOSIS — F201 Disorganized schizophrenia: Secondary | ICD-10-CM | POA: Diagnosis not present

## 2023-05-30 NOTE — Progress Notes (Signed)
[facility] MD Progress Note  05/30/2023 3:53 PM Brendan Hogan  MRN:  [id number]  Subjective:   28 year old male, with a psychiatric history significant for polysubstance abuse, psychosis, schizophrenia, and multiple psychiatric hospitalizations. According to family reports, over the past week, he has exhibited decreased oral intake, increased self-talking, and escalating aggression. It has been more than a week since he last took his psychotropic medications. The family further reported that he became combative at home, prompting them to contact law enforcement. Upon police arrival, the patient continued to display combative behavior, leading to his transport to [name], ED on January 22 under involuntary commitment (IVC).   Daily notes: Brendan Hogan is seen in his room, chart reviewed. The chart findings discussed with the treatment team. He was lying down in his bed, but able to sit-up on his bed for this follow-up assessment. He presents alert, oriented x 3. He presents with a good affect, good eye contact & verbally responsive. He reports, "I'm doing well. I had another shot of my medicine again today. I did not take my medicine by mouth because I do not need any medicines. I'm fine. I'm refusing medicines because I do not need them. I had gone for 8 months without taking any medicines & I'm still fine". Patient is instructed that he is in the hospital now because he was not doing too well in the community. He replied, "I will start taking my medicines from now onwards. I had a talk with my doctor & he encouraged me to take my medicines or I will continue to get shots. I skipped breakfast this morning because I was not feeling hungry. But I will go for lunch". Patient currently denies any SIHI, AVH, delusional thoughts or paranoia. He does not appear to be responding to any internal stimuli.   At this time, patient remains in no apparent distress. Will continue current plan of care as already in progress. Vital  signs remain stable.  Principal Problem: Disorganized schizophrenia (HCC)  Diagnosis: Principal Problem:   Disorganized schizophrenia (HCC)  Total Time spent with patient: 45 minutes  Past Psychiatric History: See H&P.  Past Medical History:  Past Medical History:  Diagnosis Date   Psychosis (HCC) 05/2019   [facility] [place]    Seasonal allergies     Past Surgical History:  Procedure Laterality Date   TONSILLECTOMY     Family History:  Family History  Problem Relation Age of Onset   Hypertension Mother    Family Psychiatric  History:   Social History:  Social History   Substance and Sexual Activity  Alcohol Use Not Currently   Comment: rare     Social History   Substance and Sexual Activity  Drug Use No    Social History   Socioeconomic History   Marital status: Single    Spouse name: Not on file   Number of children: Not on file   Years of education: Not on file   Highest education level: Not on file  Occupational History   Not on file  Tobacco Use   Smoking status: Every Day    Current packs/day: 1.00    Types: Cigarettes   Smokeless tobacco: Former  Building services engineer status: Never Used  Substance and Sexual Activity   Alcohol use: Not Currently    Comment: rare   Drug use: No   Sexual activity: Not Currently  Other Topics Concern   Not on file  Social History Narrative  Not on file   Social Drivers of Health   Financial Resource Strain: Not on file  Food Insecurity: Patient Declined (05/27/2023)   Hunger Vital Sign    Worried About Running Out of Food in the Last Year: Patient declined    Ran Out of Food in the Last Year: Patient declined  Transportation Needs: Patient Declined (05/27/2023)   PRAPARE - Administrator, Civil Service (Medical): Patient declined    Lack of Transportation (Non-Medical): Patient declined  Physical Activity: Not on file  Stress: Not on file  Social Connections: Not on file   Additional  Social History:   Sleep: Good  Appetite:  Good  Current Medications: Current Facility-Administered Medications  Medication Dose Route Frequency Provider Last Rate Last Admin   acetaminophen (TYLENOL) tablet 650 mg  650 mg Oral Q6H PRN Eligha Bridegroom, NP       alum & mag hydroxide-simeth (MAALOX/MYLANTA) 200-200-20 MG/5ML suspension 30 mL  30 mL Oral Q4H PRN Eligha Bridegroom, NP       benztropine (COGENTIN) tablet 0.5 mg  0.5 mg Oral BID Massengill, Harrold Donath, MD       divalproex (DEPAKOTE) DR tablet 500 mg  500 mg Oral Q12H Massengill, Nathan, MD       fluPHENAZine (PROLIXIN) tablet 5 mg  5 mg Oral Q12H Massengill, Nathan, MD       Or   fluPHENAZine (PROLIXIN) injection 2.5 mg  2.5 mg Intramuscular Q12H Massengill, Harrold Donath, MD   2.5 mg at 05/30/23 5284   hydrOXYzine (ATARAX) tablet 25 mg  25 mg Oral TID PRN Eligha Bridegroom, NP       magnesium hydroxide (MILK OF MAGNESIA) suspension 30 mL  30 mL Oral Daily PRN Eligha Bridegroom, NP       mirtazapine (REMERON) tablet 15 mg  15 mg Oral QHS Eligha Bridegroom, NP       nicotine (NICODERM CQ - dosed in mg/24 hours) patch 21 mg  21 mg Transdermal Daily Eligha Bridegroom, NP       OLANZapine (ZYPREXA) injection 10 mg  10 mg Intramuscular TID PRN Eligha Bridegroom, NP       OLANZapine (ZYPREXA) injection 5 mg  5 mg Intramuscular TID PRN Eligha Bridegroom, NP       OLANZapine zydis (ZYPREXA) disintegrating tablet 5 mg  5 mg Oral TID PRN Eligha Bridegroom, NP       traZODone (DESYREL) tablet 50 mg  50 mg Oral QHS PRN Eligha Bridegroom, NP        Lab Results: No results found for this or any previous visit (from the past 48 hours).  Blood Alcohol level:  Lab Results  Component Value Date   ETH <10 05/21/2023   ETH <10 08/18/2022    Metabolic Disorder Labs: Lab Results  Component Value Date   HGBA1C 5.3 12/09/2022   MPG 105 12/09/2022   MPG 108 05/02/2022   Lab Results  Component Value Date   PROLACTIN 26.5 (H) 09/08/2019   Lab Results   Component Value Date   CHOL 150 12/09/2022   TRIG 95 12/09/2022   HDL 40 (L) 12/09/2022   CHOLHDL 3.8 12/09/2022   VLDL 19 12/09/2022   LDLCALC 91 12/09/2022   LDLCALC 96 05/02/2022    Physical Findings: AIMS:  , ,  ,  ,    CIWA:    COWS:     Musculoskeletal: Strength & Muscle Tone: within normal limits Gait & Station: normal Patient leans: N/A  Psychiatric Specialty Exam:  Presentation  General Appearance:  Casual; Fairly Groomed  Eye Contact: Fair  Speech: Clear and Coherent  Speech Volume: Normal  Handedness: Right   Mood and Affect  Mood: Euthymic  Affect: Labile   Thought Process  Thought Processes: Linear  Descriptions of Associations:Intact  Orientation:Full (Time, Place and Person)  Thought Content:Paranoid Ideation  History of Schizophrenia/Schizoaffective disorder:Yes  Duration of Psychotic Symptoms:Greater than six months  Hallucinations:No data recorded  Ideas of Reference:Delusions  Suicidal Thoughts:Suicidal Thoughts: No  Homicidal Thoughts:Homicidal Thoughts: No   Sensorium  Memory: Immediate Fair  Judgment: Poor  Insight: Shallow   Executive Functions  Concentration: Fair  Attention Span: Fair  Recall: Fair  Fund of Knowledge: Fair  Language: Fair   Psychomotor Activity  Psychomotor Activity: Psychomotor Activity: Normal   Assets  Assets: Housing; Physical Health; Social Support   Sleep  Sleep: Sleep: Fair   Physical Exam: Physical Exam Vitals and nursing note reviewed.  Cardiovascular:     Rate and Rhythm: Normal rate.     Pulses: Normal pulses.  Pulmonary:     Effort: Pulmonary effort is normal.  Genitourinary:    Comments: Deferred Musculoskeletal:        General: Normal range of motion.     Cervical back: Normal range of motion.  Skin:    General: Skin is warm and dry.  Neurological:     General: No focal deficit present.     Mental Status: He is alert and oriented  to person, place, and time.    Review of Systems  Constitutional:  Negative for chills, diaphoresis and fever.  HENT:  Negative for congestion and sore throat.   Respiratory:  Negative for cough, shortness of breath and wheezing.   Cardiovascular:  Negative for chest pain and palpitations.  Gastrointestinal:  Negative for abdominal pain, constipation, diarrhea, heartburn, nausea and vomiting.  Musculoskeletal:  Negative for joint pain and myalgias.  Neurological:  Negative for dizziness, tingling, tremors, sensory change, speech change, focal weakness, seizures, loss of consciousness, weakness and headaches.  Endo/Heme/Allergies:        Allergies: Haldol [haloperidol], Fluphenazine, Geodon [ziprasidone]         Blood pressure 97/75, pulse 81, temperature 97.7 F (36.5 C), temperature source Oral, resp. rate 16, height 5\' 5"  (1.651 m), weight 58.5 kg, SpO2 100%. Body mass index is 21.47 kg/m.   Treatment Plan Summary: Daily contact with patient to assess and evaluate symptoms and progress in treatment and Medication management.   PLAN: Safety and Monitoring:             -- Inoluntary admission to inpatient psychiatric unit for safety, stabilization and treatment             -- Daily contact with patient to assess and evaluate symptoms and progress in treatment             -- Patient's case to be discussed in multi-disciplinary team meeting             -- Observation Level : q15 minute checks             -- Vital signs:  q12 hours             -- Precautions: suicide, elopement, and assault   2. Psychiatric Diagnoses and Treatment:                           Schizophrenia   -- Continue Cogentin 0.5 mg, oral, 2  times daily for prevention of eps. -- Continue Depakote DR, 500 mg po Q 12 hours -- Continue Prolixin 5 mg, oral, every 12 hours -- Continue mirtazapine 15 mg, oral, Daily at bedtime -- Continue hydroxyzine 25 mg, oral, 3 times daily as needed, anxiety -- Continue  trazodone 50 mg, oral, at bedtime as needed, sleep   Involuntary Medication Order (give IM if patient refuses oral) -- Prolixin 5 mg, oral, every 12 hours OR  -- Prolixin injection 2.5 mg, IM, every 12 hours     Continue Agitation Protocol as ordered -- Olanzapine disintegrating tablet 5 mg, oral, 3 times daily as needed, mild agitation -- Olanzapine injection 5 mg, IM, 3 times daily, as needed, moderate agitation -- Olanzapine injection 10 mg, IM, 3 times daily as needed, severe agitation     --  The risks/benefits/side-effects/alternatives to this medication were discussed in detail with the patient and time was given for questions. The patient consents to medication trial.  -- FDA             -- Metabolic profile and EKG monitoring obtained while on an atypical antipsychotic (BMI: Lipid Panel: HbgA1c: QTc:)              -- Encouraged patient to participate in unit milieu and in scheduled group therapies   Armandina Stammer, NP, pmhnp, fnp-bc. 05/30/2023, 3:53 PM Patient ID: Brendan Hogan, male   DOB: 1995-12-25, 28 y.o.   MRN: [id number]

## 2023-05-30 NOTE — Progress Notes (Signed)
   05/30/23 1000  Psych Admission Type (Psych Patients Only)  Admission Status Involuntary  Psychosocial Assessment  Patient Complaints Suspiciousness  Eye Contact Fair  Facial Expression Anxious  Affect Anxious;Preoccupied  Speech Pressured  Interaction Defensive  Motor Activity Slow  Appearance/Hygiene In scrubs  Behavior Characteristics Guarded;Resistant to care  Mood Preoccupied;Suspicious  Thought Process  Coherency Circumstantial  Content Preoccupation  Delusions Paranoid  Perception Hallucinations  Hallucination Auditory  Judgment Impaired  Confusion WDL  Danger to Self  Current suicidal ideation? Denies  Danger to Others  Danger to Others None reported or observed   Note: Pt remains isolative and refuses oral medication. IM given per forced medication protocol. Routine safety checks maintained.

## 2023-05-30 NOTE — Group Note (Signed)
Recreation Therapy Group Note   Group Topic:Problem Solving  Group Date: 05/30/2023 Start Time: 1020 End Time: 1055 Facilitators: Siaosi Alter-McCall, LRT,CTRS Location: [address] Dayroom   Group Topic: Problem Solving  Goal Area(s) Addresses:  Patient will effectively work in a team with other group members. Patient will verbalize importance of using appropriate problem solving techniques.  Patient will identify positive change associated with effective problem solving skills.   Intervention: Worksheets, Music  Activity: Patients were given two sheets of brain teasers. Patients were to work through each puzzle to come up with the answer. Patients had the option of working together or individually.    Education: Problem solving, Use of skills post discharge  Education Outcome: Acknowledges understanding/In group clarification offered/Needs additional education.    Affect/Mood: N/A   Participation Level: Did not attend    Clinical Observations/Individualized Feedback:      Plan: Continue to engage patient in RT group sessions 2-3x/week.   Tasheem Elms-McCall, LRT,CTRS 05/30/2023 12:39 PM

## 2023-05-30 NOTE — Progress Notes (Signed)
Recreation Therapy Notes  INPATIENT RECREATION THERAPY ASSESSMENT  Patient Details Name: Brendan Hogan MRN: [id number] DOB: [birth year] Today's Date: 05/30/2023       Information Obtained From: Patient  Able to Participate in Assessment/Interview: Yes  Patient Presentation: Alert (Pt had some hesitation in his responses and would need the question repeated.)  Reason for Admission (Per Patient): Other (Comments) ("Police")  Patient Stressors:  (None)  Coping Skills:   Exercise, Meditate, Deep Breathing, Substance Abuse, Talk, Hot Bath/Shower  Leisure Interests (2+):  Individual - Other (Comment) (Chill)  Frequency of Recreation/Participation: Other (Comment) (Daily)  Awareness of Community Resources:  No  Expressed Interest in [employer] Information: No  County of Residence:  [place]  Patient Main Form of Transportation: Walk  Patient Strengths:  "I don't know"  Patient Identified Areas of Improvement:  "I don't want to talk about it"  Patient Goal for Hospitalization:  "to leave"  Current SI (including self-harm):  No  Current HI:  No  Current AVH: No  Staff Intervention Plan: Group Attendance, Collaborate with Interdisciplinary Treatment Team  Consent to Intern Participation: N/A   Khamila Bassinger-McCall, LRT,CTRS Taiylor Virden A Clementina Mareno-McCall 05/30/2023, 1:19 PM

## 2023-05-30 NOTE — [facility] Group Notes (Signed)
Spiritual care group: Group met via web-ex due to COVID-19 precautions. Group facilitated by Kathleen Argue, B[facility]  Group focused on topic of strength. Group members reflected on what thoughts and feelings emerge when they hear this topic. They then engaged in facilitated dialog around how strength is present in their lives. This dialog focused on representing what strength had been to them in their lives (images and patterns given) and what they saw as helpful in their life now (what they needed / wanted).  Activity drew on narrative framework.  Patient Progress: Did not attend.

## 2023-05-30 NOTE — Plan of Care (Signed)
   Problem: Education: Goal: Emotional status will improve Outcome: Not Progressing Goal: Mental status will improve Outcome: Not Progressing

## 2023-05-30 NOTE — Group Note (Signed)
Date:  05/30/2023 Time:  9:06 PM  Group Topic/Focus:  Wrap-Up Group:   The focus of this group is to help patients review their daily goal of treatment and discuss progress on daily workbooks.    Participation Level:  Did Not Attend   Scot Dock 05/30/2023, 9:06 PM

## 2023-05-31 DIAGNOSIS — F201 Disorganized schizophrenia: Secondary | ICD-10-CM | POA: Diagnosis not present

## 2023-05-31 MED ORDER — PROPRANOLOL HCL 10 MG PO TABS
10.0000 mg | ORAL_TABLET | Freq: Two times a day (BID) | ORAL | Status: DC
Start: 2023-05-31 — End: 2023-06-02
  Administered 2023-05-31 – 2023-06-02 (×5): 10 mg via ORAL
  Filled 2023-05-31 (×10): qty 1

## 2023-05-31 MED ORDER — NICOTINE POLACRILEX 2 MG MT GUM
2.0000 mg | CHEWING_GUM | OROMUCOSAL | Status: DC | PRN
  Administered 2023-06-01 – 2023-06-05 (×5): 2 mg via ORAL
  Filled 2023-05-31: qty 1

## 2023-05-31 MED ORDER — PROPRANOLOL HCL 10 MG PO TABS: 10.0000 mg | ORAL_TABLET | Freq: Three times a day (TID) | ORAL | Status: DC | PRN

## 2023-05-31 NOTE — Progress Notes (Signed)
Pt took majority of his bedtime medications but refused mirtazapine and trazodone. He stated he had insomnia and difficultly falling asleep but he preferred to take Ambien. Pt educated on his medications but still refused the meds.

## 2023-05-31 NOTE — Progress Notes (Signed)
[facility] MD Progress Note  05/31/2023 9:46 AM [name] TAMBURO  MRN:  [id number]  Subjective:    28 year old male, with a psychiatric history significant for polysubstance abuse, psychosis, schizophrenia, and multiple psychiatric hospitalizations. According to family reports, over the past week, he has exhibited decreased oral intake, increased self-talking, and escalating aggression. It has been more than a week since he last took his psychotropic medications. The family further reported that he became combative at home, prompting them to contact law enforcement. Upon police arrival, the patient continued to display combative behavior, leading to his transport to [name], ED on January 22 under involuntary commitment (IVC).   On examination today, patient is fixated on obtaining Ambien.  He states he needs it for sleep although he slept well last night.  Reports he is having shaking motions of his legs, which he was not having until he mentioned Ambien, and then made very obviously feigned shaking motions of his legs which stopped after he is stopped talking about Ambien.  We discussed that Prolixin is causing him to have shaking of his legs and that Ambien is the only medication that helps.  Denies any other muscle stiffness or muscle cramps.  He is paranoid about the staff here.  Paranoid about his family.  He is irritable.  Per nursing he is responding to internal stimuli.  He did start taking his oral medication last night, for the first doses since admission.  We discussed that we will need valproic acid level and about 4 days or so after he starts taking the oral Depakote, before we can consider discharge on this medication. Otherwise the patient denies feeling depressed.  Denies any AH or VH.  Denies any SI or HI.    Principal Problem: Disorganized schizophrenia (HCC) Diagnosis: Principal Problem:   Disorganized schizophrenia (HCC)  Total Time spent with patient: 20 minutes  Past Psychiatric  History:  Polysubstance abuse, psychosis, schizophrenia, noncompliance of medication and multiple psychiatric hospitalizations.   Past Medical History:  Past Medical History:  Diagnosis Date   Psychosis (HCC) 05/2019   [facility]    Seasonal allergies     Past Surgical History:  Procedure Laterality Date   TONSILLECTOMY     Family History:  Family History  Problem Relation Age of Onset   Hypertension Mother    Family Psychiatric  History: See H&P Social History:  Social History   Substance and Sexual Activity  Alcohol Use Not Currently   Comment: rare     Social History   Substance and Sexual Activity  Drug Use No    Social History   Socioeconomic History   Marital status: Single    Spouse name: Not on file   Number of children: Not on file   Years of education: Not on file   Highest education level: Not on file  Occupational History   Not on file  Tobacco Use   Smoking status: Every Day    Current packs/day: 1.00    Types: Cigarettes   Smokeless tobacco: Former  Building services engineer status: Never Used  Substance and Sexual Activity   Alcohol use: Not Currently    Comment: rare   Drug use: No   Sexual activity: Not Currently  Other Topics Concern   Not on file  Social History Narrative   Not on file   Social Drivers of Health   Financial Resource Strain: Not on file  Food Insecurity: Patient Declined (05/27/2023)  Hunger Vital Sign    Worried About Running Out of Food in the Last Year: Patient declined    Ran Out of Food in the Last Year: Patient declined  Transportation Needs: Patient Declined (05/27/2023)   PRAPARE - Administrator, Civil Service (Medical): Patient declined    Lack of Transportation (Non-Medical): Patient declined  Physical Activity: Not on file  Stress: Not on file  Social Connections: Not on file   Additional Social History:                         Sleep: Good  Appetite:   Good  Current Medications: Current Facility-Administered Medications  Medication Dose Route Frequency Provider Last Rate Last Admin   acetaminophen (TYLENOL) tablet 650 mg  650 mg Oral Q6H PRN Eligha Bridegroom, NP       alum & mag hydroxide-simeth (MAALOX/MYLANTA) 200-200-20 MG/5ML suspension 30 mL  30 mL Oral Q4H PRN Eligha Bridegroom, NP       benztropine (COGENTIN) tablet 0.5 mg  0.5 mg Oral BID Sixto Bowdish, Harrold Donath, MD   0.5 mg at 05/31/23 0865   divalproex (DEPAKOTE) DR tablet 500 mg  500 mg Oral Q12H Alantra Popoca, Harrold Donath, MD   500 mg at 05/31/23 7846   fluPHENAZine (PROLIXIN) tablet 5 mg  5 mg Oral Q12H Heberto Sturdevant, Harrold Donath, MD   5 mg at 05/31/23 9629   Or   fluPHENAZine (PROLIXIN) injection 2.5 mg  2.5 mg Intramuscular Q12H Tkai Serfass, Harrold Donath, MD   2.5 mg at 05/30/23 5284   hydrOXYzine (ATARAX) tablet 25 mg  25 mg Oral TID PRN Eligha Bridegroom, NP       magnesium hydroxide (MILK OF MAGNESIA) suspension 30 mL  30 mL Oral Daily PRN Eligha Bridegroom, NP       nicotine (NICODERM CQ - dosed in mg/24 hours) patch 21 mg  21 mg Transdermal Daily Eligha Bridegroom, NP       OLANZapine (ZYPREXA) injection 10 mg  10 mg Intramuscular TID PRN Eligha Bridegroom, NP       OLANZapine (ZYPREXA) injection 5 mg  5 mg Intramuscular TID PRN Eligha Bridegroom, NP       OLANZapine zydis (ZYPREXA) disintegrating tablet 5 mg  5 mg Oral TID PRN Eligha Bridegroom, NP       propranolol (INDERAL) tablet 10 mg  10 mg Oral Q12H Ettamae Barkett, MD       propranolol (INDERAL) tablet 10 mg  10 mg Oral TID PRN Phineas Inches, MD       traZODone (DESYREL) tablet 50 mg  50 mg Oral QHS PRN Eligha Bridegroom, NP        Lab Results: No results found for this or any previous visit (from the past 48 hours).  Blood Alcohol level:  Lab Results  Component Value Date   ETH <10 05/21/2023   ETH <10 08/18/2022    Metabolic Disorder Labs: Lab Results  Component Value Date   HGBA1C 5.3 12/09/2022   MPG 105 12/09/2022   MPG  108 05/02/2022   Lab Results  Component Value Date   PROLACTIN 26.5 (H) 09/08/2019   Lab Results  Component Value Date   CHOL 150 12/09/2022   TRIG 95 12/09/2022   HDL 40 (L) 12/09/2022   CHOLHDL 3.8 12/09/2022   VLDL 19 12/09/2022   LDLCALC 91 12/09/2022   LDLCALC 96 05/02/2022    Physical Findings: AIMS:  , ,  ,  ,    CIWA:  COWS:     Patient is obviously feigning tremors, having voluntary movements off and on when discussing his medications.  Musculoskeletal: Strength & Muscle Tone: within normal limits Gait & Station: normal Patient leans: N/A  Psychiatric Specialty Exam:  Presentation  General Appearance:  Casual  Eye Contact: Fair  Speech: Normal Rate  Speech Volume: Increased  Handedness: Right   Mood and Affect  Mood: Irritable  Affect: Full Range   Thought Process  Thought Processes: Goal Directed  Descriptions of Associations:Intact  Orientation:Partial  Thought Content:Paranoid Ideation  History of Schizophrenia/Schizoaffective disorder:Yes  Duration of Psychotic Symptoms:Greater than six months  Hallucinations:Hallucinations: None  Ideas of Reference:Paranoia  Suicidal Thoughts:Suicidal Thoughts: No  Homicidal Thoughts:Homicidal Thoughts: No   Sensorium  Memory: Immediate Fair; Recent Fair; Remote Fair  Judgment: Poor  Insight: Shallow   Executive Functions  Concentration: Fair  Attention Span: Fair  Recall: [employer] of Knowledge: Fair  Language: Fair   Psychomotor Activity  Psychomotor Activity: Psychomotor Activity: Normal   Assets  Assets: Housing; Physical Health; Social Support   Sleep  Sleep: Sleep: Fair    Physical Exam: Physical Exam Vitals reviewed.  Constitutional:      General: He is not in acute distress.    Appearance: He is normal weight. He is not toxic-appearing.  Pulmonary:     Effort: Pulmonary effort is normal. No respiratory distress.  Neurological:      Mental Status: He is alert.     Motor: No weakness.     Gait: Gait normal.  Psychiatric:        Behavior: Behavior normal.    Review of Systems  Constitutional:  Negative for chills and fever.  Cardiovascular:  Negative for chest pain and palpitations.  Neurological:  Negative for dizziness, tingling, tremors and headaches.  Psychiatric/Behavioral:  Positive for substance abuse. Negative for depression, hallucinations, memory loss and suicidal ideas. The patient is nervous/anxious. The patient does not have insomnia.        +paranoia  All other systems reviewed and are negative.  Blood pressure 113/72, pulse 88, temperature 97.6 F (36.4 C), temperature source Oral, resp. rate 16, height 5\' 5"  (1.651 m), weight 58.5 kg, SpO2 100%. Body mass index is 21.47 kg/m.   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 Psychiatric Diagnoses and Treatment:    Involuntary Medication Order (give IM if patient refuses oral) -- Prolixin 5 mg, oral, every 12 hours OR  -- Prolixin injection 2.5 mg, IM, every 12 hours  -- Continue Cogentin 0.5 mg, oral, 2 times daily  -- Start propranolol 10 mg twice daily for akathisia or EPS secondary to Prolixin.  Patient request medication for this, specifically Ambien.  We discussed starting a noncontrolled substance if patient is experiencing the symptoms and he is  ultimately agreeable to start propranolol.  We will also start a propranolol 10 mg as needed dose for breakthrough symptoms.  -- Continue Depakote DR, 500 mg, oral, every 12  hours -valproic acid level due 2/3 in the morning, already ordered  -- D/c mirtazapine 15 mg, oral, Daily at bedtime - dc due to patient preference  --Continue hydroxyzine 25 mg, oral, 3 times daily as needed, anxiety  -- Continue trazodone 50 mg, oral, at bedtime as needed, sleep       --  The risks/benefits/side-effects/alternatives to this medication were discussed in detail with the patient and time was given for questions. The patient consents to medication trial.    -- Metabolic profile and EKG monitoring obtained while on an atypical antipsychotic (BMI: Lipid Panel: HbgA1c: QTc:)   -- Encouraged patient to participate in unit milieu and in scheduled group therapies   -- Short Term Goals: Ability to identify changes in lifestyle to reduce recurrence of condition will improve, Ability to verbalize feelings will improve, Ability to disclose and discuss suicidal ideas, Ability to demonstrate self-control will improve, Ability to identify and develop effective coping behaviors will improve, Ability to maintain clinical measurements within normal limits will improve, Compliance with prescribed medications will improve, and Ability to identify triggers associated with substance abuse/mental health issues will improve  -- Long Term Goals: Improvement in symptoms so as ready for discharge    3. Medical Issues Being Addressed:   Tobacco Use Disorder  -- Nicotine patch 21mg /24 hours ordered  -- Smoking cessation encouraged  4. Discharge Planning:   -- Social work and case management to assist with discharge planning and identification of hospital follow-up needs prior to discharge  -- Estimated LOS: 5-7 days  -- Discharge Concerns: Need to establish a safety plan; Medication compliance and effectiveness  -- Discharge  Goals: Return home with outpatient referrals for mental health follow-up including medication management/psychotherapy   Phineas Inches, MD 05/31/2023, 9:46 AM  Total Time Spent in Direct Patient Care:  I personally spent 35 minutes on the unit in direct patient care. The direct patient care time included face-to-face time with the patient, reviewing the patient's chart, communicating with other professionals, and coordinating care. Greater than 50% of this time was spent in counseling or coordinating care with the patient regarding goals of hospitalization, psycho-education, and discharge planning needs.   Phineas Inches, MD Psychiatrist

## 2023-05-31 NOTE — Group Note (Signed)
Date:  05/31/2023 Time:  8:55 PM  Group Topic/Focus:  Wrap-Up Group:   The focus of this group is to help patients review their daily goal of treatment and discuss progress on daily workbooks.    Participation Level:  Active  Participation Quality:  Appropriate  Affect:  Appropriate  Cognitive:  Appropriate  Insight: Appropriate  Engagement in Group:  Engaged  Modes of Intervention:  Education and Exploration  Additional Comments:  Patient attended and participated in group tonight  Scot Dock 05/31/2023, 8:55 PM

## 2023-05-31 NOTE — Plan of Care (Signed)
  Problem: Health Behavior/Discharge Planning: Goal: Compliance with treatment plan for underlying cause of condition will improve Outcome: Progressing   Problem: Physical Regulation: Goal: Ability to maintain clinical measurements within normal limits will improve Outcome: Progressing   Problem: Safety: Goal: Periods of time without injury will increase Outcome: Progressing   Problem: Activity: Goal: Interest or engagement in activities will improve Outcome: Not Progressing   Pt did not attend scheduled groups despite multiple prompts.

## 2023-05-31 NOTE — Group Note (Unsigned)
Date:  05/31/2023 Time:  8:44 PM  Group Topic/Focus:  Wrap-Up Group:   The focus of this group is to help patients review their daily goal of treatment and discuss progress on daily workbooks.     Participation Level:  {B[facility] PARTICIPATION ZOXWR:60454}  Participation Quality:  {B[facility] PARTICIPATION QUALITY:22265}  Affect:  {B[facility] AFFECT:22266}  Cognitive:  {B[facility] COGNITIVE:22267}  Insight: {BHH Insight2:20797}  Engagement in Group:  {B[facility] ENGAGEMENT IN UJWJX:91478}  Modes of Intervention:  {B[facility] MODES OF INTERVENTION:22269}  Additional Comments:  ***  Scot Dock 05/31/2023, 8:44 PM

## 2023-05-31 NOTE — Progress Notes (Signed)
Pt noted in bed, irritable with elective mutism on initial interactions. Denies SI, HI, AVH and pain when head node. Took his medication with mouth checks to ensure compliance. Refused to attend scheduled groups despite multiple prompts. Observed to be guarded, paranoid and isolative to his room this shift. Talking to himself in his room when not in bed. Safety checks maintained at Q 15 minutes intervals. Emotional support, encouragement and reassurance offered to pt.

## 2023-05-31 NOTE — Plan of Care (Signed)
   Problem: Education: Goal: Emotional status will improve Outcome: Progressing Goal: Mental status will improve Outcome: Progressing Goal: Verbalization of understanding the information provided will improve Outcome: Progressing   Problem: Activity: Goal: Interest or engagement in activities will improve Outcome: Progressing

## 2023-05-31 NOTE — Progress Notes (Signed)
   05/31/23 2200  Psych Admission Type (Psych Patients Only)  Admission Status Involuntary  Psychosocial Assessment  Patient Complaints Suspiciousness  Eye Contact Brief  Facial Expression Animated  Affect Apprehensive  Speech Logical/coherent  Interaction Minimal  Motor Activity Other (Comment) (WNL)  Appearance/Hygiene Unremarkable  Behavior Characteristics Guarded  Mood Suspicious  Thought Process  Coherency Circumstantial  Content Preoccupation  Delusions Paranoid  Perception Hallucinations  Hallucination None reported or observed  Judgment Impaired  Confusion None  Danger to Self  Current suicidal ideation? Denies  Danger to Others  Danger to Others None reported or observed

## 2023-05-31 NOTE — [facility] Group Notes (Signed)
[facility] Group Notes:  (Nursing/MHT/Case Management/Adjunct) Adult Psychoeducational Group Note  Date:  05/31/2023 Time:  11:12 AM  Group Topic/Focus:  Goals Group:   The focus of this group is to help patients establish daily goals to achieve during treatment and discuss how the patient can incorporate goal setting into their daily lives to aide in recovery. Orientation:   The focus of this group is to educate the patient on the purpose and policies of crisis stabilization and provide a format to answer questions about their admission.  The group details unit policies and expectations of patients while admitted.  Participation Level:  Did Not Attend  Additional Comments:  Did not attend.   Vilma Will Alen Blew 05/31/2023, 11:12 AM

## 2023-06-01 DIAGNOSIS — F201 Disorganized schizophrenia: Secondary | ICD-10-CM | POA: Diagnosis not present

## 2023-06-01 NOTE — Progress Notes (Signed)
   06/01/23 2045  Psych Admission Type (Psych Patients Only)  Admission Status Involuntary  Psychosocial Assessment  Patient Complaints Suspiciousness  Eye Contact Brief  Facial Expression Anxious;Animated  Affect Anxious;Apprehensive  Speech Logical/coherent  Interaction Cautious;Guarded  Motor Activity Slow  Behavior Characteristics Cooperative;Anxious;Guarded  Mood Suspicious;Anxious  Aggressive Behavior  Effect No apparent injury  Thought Process  Coherency Circumstantial  Content Preoccupation  Delusions Paranoid  Perception Hallucinations  Hallucination Auditory  Judgment WDL  Confusion None  Danger to Self  Current suicidal ideation? Denies  Danger to Others  Danger to Others None reported or observed

## 2023-06-01 NOTE — Plan of Care (Signed)
Pt presents with animated expression and an apprehensive, preoccupied affect. Pt is very guarded and forwards little in his interactions with staff. Remains isolative to his room throughout the day. Reports good sleep and appetite. Denied SI, HI, and pain. Continued auditory hallucinations and was observed responding to internal stimuli. Medication compliant with no adverse reactions. Safety checks maintained at q 15 minutes. Support, encouragement, and reassurance offered to the pt.   Problem: Education: Goal: Emotional status will improve Outcome: Progressing Goal: Verbalization of understanding the information provided will improve Outcome: Progressing   Problem: Safety: Goal: Periods of time without injury will increase Outcome: Progressing

## 2023-06-01 NOTE — Plan of Care (Signed)
   Problem: Education: Goal: Emotional status will improve Outcome: Progressing Goal: Mental status will improve Outcome: Progressing   Problem: Activity: Goal: Interest or engagement in activities will improve Outcome: Progressing Goal: Sleeping patterns will improve Outcome: Progressing   Problem: Safety: Goal: Periods of time without injury will increase Outcome: Progressing

## 2023-06-01 NOTE — Progress Notes (Signed)
I called the pt's father, with the pt's permission RICHAD, RAMSAY (Father) [phone] [facility])   Father is Armed forces operational officer [employer]. Father states he thinks that patient is overall doing better, but he knows that patient will stop his medications, be okay for a few weeks then psychiatrically decompensate. He confirms never takes his medications at home and consistently refuses to participate in care with the ACT team. Father consents for LAI due to chronic non-adherence.  We discussed the pt's diagnosis, hospital course, treatment, response to treatment, and discharge planning - likely Wednesday 2/5. At the end of the call, the caller had no further questions.

## 2023-06-01 NOTE — [facility] Group Notes (Signed)
Adult Psychoeducational Group Note  Date:  06/01/2023 Time:  2:31 PM  Group Topic/Focus:  Goals Group:   The focus of this group is to help patients establish daily goals to achieve during treatment and discuss how the patient can incorporate goal setting into their daily lives to aide in recovery. Orientation:   The focus of this group is to educate the patient on the purpose and policies of crisis stabilization and provide a format to answer questions about their admission.  The group details unit policies and expectations of patients while admitted.  Participation Level:  Did Not Attend  Participation Quality:    Affect:    Cognitive:    Insight:   Engagement in Group:    Modes of Intervention:    Additional Comments:    Sheran Lawless 06/01/2023, 2:31 PM

## 2023-06-01 NOTE — [facility] Group Notes (Signed)
Adult Psychoeducational Group Note  Date:  06/01/2023 Time:  2:31 PM  Group Topic/Focus: Resources  Participation Level:  Did Not Attend  Participation Quality:    Affect:    Cognitive:    Insight:   Engagement in Group:    Modes of Intervention:    Additional Comments:    Sheran Lawless 06/01/2023, 2:31 PM

## 2023-06-01 NOTE — Progress Notes (Signed)
[facility] MD Progress Note  06/01/2023 9:43 AM DAEL HOWLAND  MRN:  [id number]  Subjective:    28 year old male, with a psychiatric history significant for polysubstance abuse, psychosis, schizophrenia, and multiple psychiatric hospitalizations. According to family reports, over the past week, he has exhibited decreased oral intake, increased self-talking, and escalating aggression. It has been more than a week since he last took his psychotropic medications. The family further reported that he became combative at home, prompting them to contact law enforcement. Upon police arrival, the patient continued to display combative behavior, leading to his transport to [name], ED on January 22 under involuntary commitment (IVC).   On interview today, patient is more pleasant and cooperative impaired to yesterday.  He is not fixated on Ambien today.  He denies any side effects to medications, that he complained about  yesterday, since starting the propranolol scheduled.  Reports that sleep is okay without any Remeron or Ambien.  Mood is euthymic.  Reports anxiety is some elevated due to wanting to be discharged.  Reports that father did not visit last night.  Reports that appetite is okay.  Concentration is okay.  Denies any SI or HI.  Denies any AH or VH.  He is still paranoid, suspicious of others.  Nursing confirms this, that he still is paranoid, suspicious of others and is always watching his back and looking over her shoulder when he is outside of his room on the unit.  We discussed again that he is taking his oral medication, and that his Depakote level is scheduled for Wednesday morning, and we will consider discharge after the result of that lab, if patient's family also thinks the patient is returned to the psychiatric baseline and is appropriate for discharge.  Patient voices understanding of this.      Principal Problem: Disorganized schizophrenia (HCC) Diagnosis: Principal Problem:    Disorganized schizophrenia (HCC)  Total Time spent with patient: 20 minutes  Past Psychiatric History:  Polysubstance abuse, psychosis, schizophrenia, noncompliance of medication and multiple psychiatric hospitalizations.   Past Medical History:  Past Medical History:  Diagnosis Date   Psychosis (HCC) 05/2019   [facility] [place]    Seasonal allergies     Past Surgical History:  Procedure Laterality Date   TONSILLECTOMY     Family History:  Family History  Problem Relation Age of Onset   Hypertension Mother    Family Psychiatric  History: See H&P Social History:  Social History   Substance and Sexual Activity  Alcohol Use Not Currently   Comment: rare     Social History   Substance and Sexual Activity  Drug Use No    Social History   Socioeconomic History   Marital status: Single    Spouse name: Not on file   Number of children: Not on file   Years of education: Not on file   Highest education level: Not on file  Occupational History   Not on file  Tobacco Use   Smoking status: Every Day    Current packs/day: 1.00    Types: Cigarettes   Smokeless tobacco: Former  Building services engineer status: Never Used  Substance and Sexual Activity   Alcohol use: Not Currently    Comment: rare   Drug use: No   Sexual activity: Not Currently  Other Topics Concern   Not on file  Social History Narrative   Not on file   Social Drivers of Health   Financial Resource Strain:  Not on file  Food Insecurity: Patient Declined (05/27/2023)   Hunger Vital Sign    Worried About Running Out of Food in the Last Year: Patient declined    Ran Out of Food in the Last Year: Patient declined  Transportation Needs: Patient Declined (05/27/2023)   PRAPARE - Administrator, Civil Service (Medical): Patient declined    Lack of Transportation (Non-Medical): Patient declined  Physical Activity: Not on file  Stress: Not on file  Social Connections: Not on file    Additional Social History:                          Current Medications: Current Facility-Administered Medications  Medication Dose Route Frequency Provider Last Rate Last Admin   acetaminophen (TYLENOL) tablet 650 mg  650 mg Oral Q6H PRN Eligha Bridegroom, NP       alum & mag hydroxide-simeth (MAALOX/MYLANTA) 200-200-20 MG/5ML suspension 30 mL  30 mL Oral Q4H PRN Eligha Bridegroom, NP       benztropine (COGENTIN) tablet 0.5 mg  0.5 mg Oral BID Rabiah Goeser, Harrold Donath, MD   0.5 mg at 06/01/23 0825   divalproex (DEPAKOTE) DR tablet 500 mg  500 mg Oral Q12H Davonda Ausley, Harrold Donath, MD   500 mg at 06/01/23 5621   fluPHENAZine (PROLIXIN) tablet 5 mg  5 mg Oral Q12H Deandrea Vanpelt, Harrold Donath, MD   5 mg at 06/01/23 0825   Or   fluPHENAZine (PROLIXIN) injection 2.5 mg  2.5 mg Intramuscular Q12H Klohe Lovering, Harrold Donath, MD   2.5 mg at 05/30/23 3086   hydrOXYzine (ATARAX) tablet 25 mg  25 mg Oral TID PRN Eligha Bridegroom, NP       magnesium hydroxide (MILK OF MAGNESIA) suspension 30 mL  30 mL Oral Daily PRN Eligha Bridegroom, NP       nicotine (NICODERM CQ - dosed in mg/24 hours) patch 21 mg  21 mg Transdermal Daily Eligha Bridegroom, NP       nicotine polacrilex (NICORETTE) gum 2 mg  2 mg Oral PRN Jevonte Clanton, Harrold Donath, MD   2 mg at 06/01/23 0827   OLANZapine (ZYPREXA) injection 10 mg  10 mg Intramuscular TID PRN Eligha Bridegroom, NP       OLANZapine (ZYPREXA) injection 5 mg  5 mg Intramuscular TID PRN Eligha Bridegroom, NP       OLANZapine zydis (ZYPREXA) disintegrating tablet 5 mg  5 mg Oral TID PRN Eligha Bridegroom, NP       propranolol (INDERAL) tablet 10 mg  10 mg Oral Q12H Roni Scow, Harrold Donath, MD   10 mg at 06/01/23 0825   propranolol (INDERAL) tablet 10 mg  10 mg Oral TID PRN Phineas Inches, MD       traZODone (DESYREL) tablet 50 mg  50 mg Oral QHS PRN Eligha Bridegroom, NP        Lab Results: No results found for this or any previous visit (from the past 48 hours).  Blood Alcohol level:  Lab Results   Component Value Date   ETH <10 05/21/2023   ETH <10 08/18/2022    Metabolic Disorder Labs: Lab Results  Component Value Date   HGBA1C 5.3 12/09/2022   MPG 105 12/09/2022   MPG 108 05/02/2022   Lab Results  Component Value Date   PROLACTIN 26.5 (H) 09/08/2019   Lab Results  Component Value Date   CHOL 150 12/09/2022   TRIG 95 12/09/2022   HDL 40 (L) 12/09/2022   CHOLHDL 3.8 12/09/2022   VLDL  19 12/09/2022   LDLCALC 91 12/09/2022   LDLCALC 96 05/02/2022    Physical Findings: AIMS:  , ,  ,  ,    CIWA:    COWS:     Patient is obviously feigning tremors, having voluntary movements off and on when discussing his medications.  Musculoskeletal: Strength & Muscle Tone: within normal limits Gait & Station: normal Patient leans: N/A  Psychiatric Specialty Exam:  Presentation  General Appearance:  Casual  Eye Contact: Fair  Speech: Normal Rate  Speech Volume: Increased  Handedness: Right   Mood and Affect  Mood: Irritable  Affect: Full Range   Thought Process  Thought Processes: Goal Directed  Descriptions of Associations:Intact  Orientation:Partial  Thought Content:Paranoid Ideation  History of Schizophrenia/Schizoaffective disorder:Yes  Duration of Psychotic Symptoms:Greater than six months  Hallucinations:Hallucinations: None  Ideas of Reference:Paranoia  Suicidal Thoughts:Suicidal Thoughts: No  Homicidal Thoughts:Homicidal Thoughts: No   Sensorium  Memory: Immediate Fair; Recent Fair; Remote Fair  Judgment: Poor  Insight: Shallow   Executive Functions  Concentration: Fair  Attention Span: Fair  Recall: [employer] of Knowledge: Fair  Language: Fair   Psychomotor Activity  Psychomotor Activity: Psychomotor Activity: Normal   Assets  Assets: Housing; Physical Health; Social Support   Sleep  Sleep: Sleep: Fair    Physical Exam: Physical Exam Vitals reviewed.  Constitutional:      General: He  is not in acute distress.    Appearance: He is normal weight. He is not toxic-appearing.  Pulmonary:     Effort: Pulmonary effort is normal. No respiratory distress.  Neurological:     Mental Status: He is alert.     Motor: No weakness.     Gait: Gait normal.  Psychiatric:        Behavior: Behavior normal.    Review of Systems  Constitutional:  Negative for chills and fever.  Cardiovascular:  Negative for chest pain and palpitations.  Neurological:  Negative for dizziness, tingling, tremors and headaches.  Psychiatric/Behavioral:  Positive for substance abuse. Negative for depression, hallucinations, memory loss and suicidal ideas. The patient is nervous/anxious. The patient does not have insomnia.        +paranoia  All other systems reviewed and are negative.  Blood pressure 91/62, pulse 76, temperature 98.1 F (36.7 C), temperature source Oral, resp. rate 16, height 5\' 5"  (1.651 m), weight 58.5 kg, SpO2 100%. Body mass index is 21.47 kg/m.   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 Psychiatric Diagnoses and Treatment:    Involuntary Medication Order (give IM if patient refuses oral) -- Prolixin 5 mg, oral, every 12 hours OR  -- Prolixin injection 2.5 mg, IM, every 12 hours  -- Continue Cogentin 0.5 mg, oral, 2 times daily  --Continue propranolol 10 mg twice daily for akathisia or  EPS secondary to Prolixin.  Patient request medication for this reported side effect, specifically Ambien.  We discussed starting a noncontrolled substance if patient is experiencing the  symptoms and he is ultimately agreeable to start propranolol.  We will also start a propranolol 10 mg as needed dose for breakthrough symptoms.  -- Continue Depakote DR, 500 mg, oral, every 12 hours -valproic acid level due 2/4 in the morning, already ordered  -- Previously discontinued mirtazapine 15 mg, oral, Daily at bedtime - dc due to patient preference  --Continue hydroxyzine 25 mg, oral, 3 times daily as needed, anxiety  -- Continue trazodone 50 mg, oral, at bedtime as needed, sleep       --  The risks/benefits/side-effects/alternatives to this medication were discussed in detail with the patient and time was given for questions. The patient consents to medication trial.    -- Metabolic profile and EKG monitoring obtained while on an atypical antipsychotic (BMI: Lipid Panel: HbgA1c: QTc:)   -- Encouraged patient to participate in unit milieu and in scheduled group therapies   -- Short Term Goals: Ability to identify changes in lifestyle to reduce recurrence of condition will improve, Ability to verbalize feelings will improve, Ability to disclose and discuss suicidal ideas, Ability to demonstrate self-control will improve, Ability to identify and develop effective coping behaviors will improve, Ability to maintain clinical measurements within normal limits will improve, Compliance with prescribed medications will improve, and Ability to identify triggers associated with substance abuse/mental health issues will improve  -- Long Term Goals: Improvement in symptoms so as ready for discharge    3. Medical Issues Being Addressed:   Tobacco Use Disorder  -- Nicotine patch 21mg /24 hours ordered  -- Smoking cessation encouraged  4. Discharge Planning:   -- Social work and case management to assist with  discharge planning and identification of hospital follow-up needs prior to discharge   -- Estimated LOS: Probably Wednesday 2/5, after result a valproic acid level   -- Discharge Concerns: Need to establish a safety plan; Medication compliance and effectiveness  -- Discharge Goals: Return home with outpatient referrals for mental health follow-up including medication management/psychotherapy   Phineas Inches, MD 06/01/2023, 9:43 AM  Total Time Spent in Direct Patient Care:  I personally spent 25 minutes on the unit in direct patient care. The direct patient care time included face-to-face time with the patient, reviewing the patient's chart, communicating with other professionals, and coordinating care. Greater than 50% of this time was spent in counseling or coordinating care with the patient regarding goals of hospitalization, psycho-education, and discharge planning needs.   Phineas Inches, MD Psychiatrist

## 2023-06-01 NOTE — Group Note (Signed)
Date:  06/01/2023 Time:  9:11 PM  Group Topic/Focus:  Wrap-Up Group:   The focus of this group is to help patients review their daily goal of treatment and discuss progress on daily workbooks.    Participation Level:  Active  Participation Quality:  Appropriate  Affect:  Appropriate  Cognitive:  Appropriate  Insight: Appropriate  Engagement in Group:  Engaged  Modes of Intervention:  Education and Exploration  Additional Comments:  Patient attended and participate in group tonight. The reports that the best thing that happen for him today was his father visiting.  Lita Mains [facility] 06/01/2023, 9:11 PM

## 2023-06-02 ENCOUNTER — Encounter (HOSPITAL_COMMUNITY): Payer: Self-pay

## 2023-06-02 DIAGNOSIS — F201 Disorganized schizophrenia: Secondary | ICD-10-CM | POA: Diagnosis not present

## 2023-06-02 MED ORDER — PROPRANOLOL HCL 10 MG PO TABS
5.0000 mg | ORAL_TABLET | Freq: Two times a day (BID) | ORAL | Status: DC
  Administered 2023-06-03 – 2023-06-04 (×3): 5 mg via ORAL
  Filled 2023-06-02 (×3): qty 0.5
  Filled 2023-06-02: qty 1
  Filled 2023-06-02 (×2): qty 0.5
  Filled 2023-06-02: qty 1
  Filled 2023-06-02 (×3): qty 0.5

## 2023-06-02 NOTE — Group Note (Signed)
Date:  06/02/2023 Time:  8:49 PM  Group Topic/Focus:  Wrap-Up Group:   The focus of this group is to help patients review their daily goal of treatment and discuss progress on daily workbooks.    Participation Level:  Active  Participation Quality:  Appropriate  Affect:  Appropriate  Cognitive:  Appropriate  Insight: Appropriate  Engagement in Group:  Engaged  Modes of Intervention:  Education and Exploration  Additional Comments:  Patient attended and participated in group tonight, He reports that he likes to watch sports entertainment  Lita Mains [place] 06/02/2023, 8:49 PM

## 2023-06-02 NOTE — Progress Notes (Signed)
   06/02/23 1100  Psych Admission Type (Psych Patients Only)  Admission Status Involuntary  Psychosocial Assessment  Patient Complaints Suspiciousness  Eye Contact Brief  Facial Expression Anxious  Affect Apprehensive  Speech Logical/coherent  Interaction Cautious;Guarded  Motor Activity Slow  Appearance/Hygiene Unremarkable  Behavior Characteristics Appropriate to situation  Mood Preoccupied;Suspicious  Thought Process  Coherency Circumstantial  Content Preoccupation  Delusions Paranoid  Perception Hallucinations  Hallucination Auditory  Judgment Impaired  Confusion None  Danger to Self  Current suicidal ideation? Denies  Danger to Others  Danger to Others None reported or observed

## 2023-06-02 NOTE — Progress Notes (Signed)
   06/02/23 2000  Psych Admission Type (Psych Patients Only)  Admission Status Involuntary  Psychosocial Assessment  Patient Complaints Suspiciousness  Eye Contact Brief  Facial Expression Anxious;Animated  Affect Anxious;Apprehensive  Speech Logical/coherent  Interaction Cautious;Guarded  Motor Activity Slow  Appearance/Hygiene Unremarkable  Behavior Characteristics Appropriate to situation  Mood Suspicious;Anxious;Preoccupied  Aggressive Behavior  Effect No apparent injury  Thought Process  Coherency Circumstantial  Content Preoccupation  Delusions Paranoid  Perception Hallucinations  Hallucination Auditory  Judgment WDL  Confusion None  Danger to Self  Current suicidal ideation? Denies  Danger to Others  Danger to Others None reported or observed

## 2023-06-02 NOTE — Plan of Care (Signed)
   Problem: Education: Goal: Emotional status will improve Outcome: Progressing Goal: Mental status will improve Outcome: Progressing

## 2023-06-02 NOTE — Plan of Care (Signed)
  Problem: Education: Goal: Emotional status will improve Outcome: Progressing Goal: Mental status will improve Outcome: Progressing   Problem: Activity: Goal: Interest or engagement in activities will improve Outcome: Progressing Goal: Sleeping patterns will improve Outcome: Progressing   Problem: Physical Regulation: Goal: Ability to maintain clinical measurements within normal limits will improve Outcome: Progressing   Problem: Safety: Goal: Periods of time without injury will increase Outcome: Progressing   Problem: Activity: Goal: Interest or engagement in activities will improve Outcome: Progressing

## 2023-06-02 NOTE — Progress Notes (Cosign Needed Addendum)
[facility] MD Progress Note  06/02/2023 4:10 PM PRINCESTON BLIZZARD  MRN:  [id number]  HPI: 28 year old male, with a psychiatric history significant for polysubstance abuse, psychosis, schizophrenia, and multiple psychiatric hospitalizations. According to family reports, over the past week, he has exhibited decreased oral intake, increased self-talking, and escalating aggression. It has been more than a week since he last took his psychotropic medications. The family further reported that he became combative at home, prompting them to contact law enforcement. Upon police arrival, the patient continued to display combative behavior, leading to his transport to [name], ED on January 22 under involuntary commitment (IVC).   24 hr chart review: Sleep Hours last night: 7.25 hrs last night as per nursing documentation Nursing Concerns: Behavioral episodes in the past 24 hrs: Medication Compliance: "Cooperative;Anxious;Guarded" as per nursing. Vital Signs in the past 24 hrs: WNL. PRN Medications in the past 24 hrs: Nicorette gum  Patient assessment note: During encounter today, pt presents with some irritability, states "make it quick!" When writer introduces self to him, and states the reason for encounter. He reports some AH, snaps back at writer in response to question regarding AH and states: "Don't you hear voices in your head? Every one hears voices in their head." He denies SI/HI/VH, denies paranoia, but is suspicious, reports that sleep last night was good, reports a good appetite, denies medication related side effects. No TD/EPS type symptoms found on assessment, and pt denies any feelings of stiffness. AIMS: 0. Patient is oriented to person, place, time.  Tentative plan for discharge is 2/5 after Depakote level is drawn. CSW will also need to complete safety planning and outpatient f/u appointments.   We are continuing all medications as listed below with no changes today.  Labs Reviewed: Depakote  Level: Scheduled for 2/5 along with BMP & repeat EKG.  Problem: Disorganized schizophrenia (HCC) Diagnosis: Principal Problem:   Disorganized schizophrenia (HCC)  Total Time spent with patient: 20 minutes  Past Psychiatric History:  Polysubstance abuse, psychosis, schizophrenia, noncompliance of medication and multiple psychiatric hospitalizations.   Past Medical History:  Past Medical History:  Diagnosis Date   Psychosis (HCC) 05/2019   [facility]    Seasonal allergies     Past Surgical History:  Procedure Laterality Date   TONSILLECTOMY     Family History:  Family History  Problem Relation Age of Onset   Hypertension Mother    Family Psychiatric  History: See H&P Social History:  Social History   Substance and Sexual Activity  Alcohol Use Not Currently   Comment: rare     Social History   Substance and Sexual Activity  Drug Use No    Social History   Socioeconomic History   Marital status: Single    Spouse name: Not on file   Number of children: Not on file   Years of education: Not on file   Highest education level: Not on file  Occupational History   Not on file  Tobacco Use   Smoking status: Every Day    Current packs/day: 1.00    Types: Cigarettes   Smokeless tobacco: Former  Building services engineer status: Never Used  Substance and Sexual Activity   Alcohol use: Not Currently    Comment: rare   Drug use: No   Sexual activity: Not Currently  Other Topics Concern   Not on file  Social History Narrative   Not on file   Social Drivers of Health  Financial Resource Strain: Not on file  Food Insecurity: Patient Declined (05/27/2023)   Hunger Vital Sign    Worried About Running Out of Food in the Last Year: Patient declined    Ran Out of Food in the Last Year: Patient declined  Transportation Needs: Patient Declined (05/27/2023)   PRAPARE - Administrator, Civil Service (Medical): Patient declined    Lack of  Transportation (Non-Medical): Patient declined  Physical Activity: Not on file  Stress: Not on file  Social Connections: Not on file   Current Medications: Current Facility-Administered Medications  Medication Dose Route Frequency Provider Last Rate Last Admin   acetaminophen (TYLENOL) tablet 650 mg  650 mg Oral Q6H PRN Eligha Bridegroom, NP       alum & mag hydroxide-simeth (MAALOX/MYLANTA) 200-200-20 MG/5ML suspension 30 mL  30 mL Oral Q4H PRN Eligha Bridegroom, NP       benztropine (COGENTIN) tablet 0.5 mg  0.5 mg Oral BID Massengill, Harrold Donath, MD   0.5 mg at 06/02/23 8413   divalproex (DEPAKOTE) DR tablet 500 mg  500 mg Oral Q12H Massengill, Harrold Donath, MD   500 mg at 06/02/23 2440   fluPHENAZine (PROLIXIN) tablet 5 mg  5 mg Oral Q12H Massengill, Harrold Donath, MD   5 mg at 06/02/23 1027   Or   fluPHENAZine (PROLIXIN) injection 2.5 mg  2.5 mg Intramuscular Q12H Massengill, Harrold Donath, MD   2.5 mg at 05/30/23 2536   hydrOXYzine (ATARAX) tablet 25 mg  25 mg Oral TID PRN Eligha Bridegroom, NP       magnesium hydroxide (MILK OF MAGNESIA) suspension 30 mL  30 mL Oral Daily PRN Eligha Bridegroom, NP       nicotine (NICODERM CQ - dosed in mg/24 hours) patch 21 mg  21 mg Transdermal Daily Eligha Bridegroom, NP       nicotine polacrilex (NICORETTE) gum 2 mg  2 mg Oral PRN Massengill, Harrold Donath, MD   2 mg at 06/02/23 1449   OLANZapine (ZYPREXA) injection 10 mg  10 mg Intramuscular TID PRN Eligha Bridegroom, NP       OLANZapine (ZYPREXA) injection 5 mg  5 mg Intramuscular TID PRN Eligha Bridegroom, NP       OLANZapine zydis (ZYPREXA) disintegrating tablet 5 mg  5 mg Oral TID PRN Eligha Bridegroom, NP       propranolol (INDERAL) tablet 10 mg  10 mg Oral TID PRN Massengill, Harrold Donath, MD       propranolol (INDERAL) tablet 5 mg  5 mg Oral Q12H Massengill, Nathan, MD       traZODone (DESYREL) tablet 50 mg  50 mg Oral QHS PRN Eligha Bridegroom, NP        Lab Results: No results found for this or any previous visit (from the past 48  hours).  Blood Alcohol level:  Lab Results  Component Value Date   ETH <10 05/21/2023   ETH <10 08/18/2022    Metabolic Disorder Labs: Lab Results  Component Value Date   HGBA1C 5.3 12/09/2022   MPG 105 12/09/2022   MPG 108 05/02/2022   Lab Results  Component Value Date   PROLACTIN 26.5 (H) 09/08/2019   Lab Results  Component Value Date   CHOL 150 12/09/2022   TRIG 95 12/09/2022   HDL 40 (L) 12/09/2022   CHOLHDL 3.8 12/09/2022   VLDL 19 12/09/2022   LDLCALC 91 12/09/2022   LDLCALC 96 05/02/2022    Physical Findings: AIMS:  , ,  ,  ,  CIWA:    COWS:     Patient is obviously feigning tremors, having voluntary movements off and on when discussing his medications.  Musculoskeletal: Strength & Muscle Tone: within normal limits Gait & Station: normal Patient leans: N/A  Psychiatric Specialty Exam:  Presentation  General Appearance:  Fairly Groomed  Eye Contact: Fair  Speech: Clear and Coherent  Speech Volume: Normal  Handedness: Right   Mood and Affect  Mood: Euthymic  Affect: Congruent   Thought Process  Thought Processes: Coherent  Descriptions of Associations:Intact  Orientation:Full (Time, Place and Person)  Thought Content:Logical  History of Schizophrenia/Schizoaffective disorder:Yes  Duration of Psychotic Symptoms:Greater than six months  Hallucinations:Hallucinations: None   Ideas of Reference:Paranoia  Suicidal Thoughts:Suicidal Thoughts: No   Homicidal Thoughts:Homicidal Thoughts: No    Sensorium  Memory: Immediate Fair  Judgment: Fair  Insight: Fair   Art therapist  Concentration: Fair  Attention Span: Fair  Recall: Fair  Fund of Knowledge: Good  Language: Good   Psychomotor Activity  Psychomotor Activity: Psychomotor Activity: Normal    Assets  Assets: Resilience; Social Support   Sleep  Sleep: Sleep: Good     Physical Exam: Physical Exam Vitals reviewed.   Constitutional:      General: He is not in acute distress.    Appearance: He is normal weight. He is not toxic-appearing.  Pulmonary:     Effort: Pulmonary effort is normal. No respiratory distress.  Neurological:     Mental Status: He is alert.     Motor: No weakness.     Gait: Gait normal.  Psychiatric:        Behavior: Behavior normal.    Review of Systems  Constitutional:  Negative for chills and fever.  Cardiovascular:  Negative for chest pain and palpitations.  Neurological:  Negative for dizziness, tingling, tremors and headaches.  Psychiatric/Behavioral:  Positive for substance abuse. Negative for depression, hallucinations, memory loss and suicidal ideas. The patient is nervous/anxious. The patient does not have insomnia.        +paranoia  All other systems reviewed and are negative.  Blood pressure 93/67, pulse 69, temperature (!) 97.5 F (36.4 C), temperature source Oral, resp. rate 16, height 5\' 5"  (1.651 m), weight 58.5 kg, SpO2 100%. Body mass index is 21.47 kg/m.   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 Psychiatric Diagnoses and Treatment:    Involuntary Medication Order (give IM if patient refuses oral) -- Prolixin 5 mg, oral, every 12 hours OR  -- Prolixin injection 2.5 mg, IM, every 12 hours  -- Continue Cogentin 0.5 mg, oral, 2 times daily  --Continue  propranolol 10 mg twice daily for akathisia or EPS secondary to Prolixin.  Patient request medication for this reported side effect, specifically Ambien.  We discussed starting a noncontrolled substance if patient is experiencing the symptoms and he is ultimately agreeable to start propranolol.  We will also start a propranolol 10 mg as needed dose for breakthrough symptoms.  --  Continue Depakote DR, 500 mg, oral, every 12 hours -valproic acid level due 2/4 in the morning, already ordered  -- Previously discontinued mirtazapine 15 mg, oral, Daily at bedtime - dc due to patient preference  --Continue hydroxyzine 25 mg, oral, 3 times daily as needed, anxiety  -- Continue trazodone 50 mg, oral, at bedtime as needed, sleep   --  The risks/benefits/side-effects/alternatives to this medication were discussed in detail with the patient and time was given for questions. The patient consents to medication trial.    -- Metabolic profile and EKG monitoring obtained while on an atypical antipsychotic (BMI: Lipid Panel: HbgA1c: QTc:)   -- Encouraged patient to participate in unit milieu and in scheduled group therapies   -- Short Term Goals: Ability to identify changes in lifestyle to reduce recurrence of condition will improve, Ability to verbalize feelings will improve, Ability to disclose and discuss suicidal ideas, Ability to demonstrate self-control will improve, Ability to identify and develop effective coping behaviors will improve, Ability to maintain clinical measurements within normal limits will improve, Compliance with prescribed medications will improve, and Ability to identify triggers associated with substance abuse/mental health issues will improve  -- Long Term Goals: Improvement in symptoms so as ready for discharge    3. Medical Issues Being Addressed:   Tobacco Use Disorder  -- Nicotine patch 21mg /24 hours ordered  -- Smoking cessation encouraged  4. Discharge Planning:   -- Social  work and case management to assist with discharge planning and identification of hospital follow-up needs prior to discharge   -- Estimated LOS: Probably Wednesday 2/5, after result a valproic acid level   -- Discharge Concerns: Need to establish a safety plan; Medication compliance and effectiveness  -- Discharge Goals: Return home with outpatient referrals for mental health follow-up including medication management/psychotherapy   Starleen Blue, NP 06/02/2023, 4:10 PM  Total Time Spent in Direct Patient Care:    Patient ID: KAHLEL PEAKE, male   DOB: June 18, 1995, 28 y.o.   MRN: [id number]

## 2023-06-02 NOTE — BH IP Treatment Plan (Signed)
Interdisciplinary Treatment and Diagnostic Plan Update  06/02/2023 Time of Session: 1156 - UPDATE Brendan Hogan MRN: [id number]  Principal Diagnosis: Disorganized schizophrenia [facility])  Secondary Diagnoses: Principal Problem:   Disorganized schizophrenia (HCC)   Current Medications:  Current Facility-Administered Medications  Medication Dose Route Frequency Provider Last Rate Last Admin   acetaminophen (TYLENOL) tablet 650 mg  650 mg Oral Q6H PRN Eligha Bridegroom, NP       alum & mag hydroxide-simeth (MAALOX/MYLANTA) 200-200-20 MG/5ML suspension 30 mL  30 mL Oral Q4H PRN Eligha Bridegroom, NP       benztropine (COGENTIN) tablet 0.5 mg  0.5 mg Oral BID Massengill, Harrold Donath, MD   0.5 mg at 06/02/23 4098   divalproex (DEPAKOTE) DR tablet 500 mg  500 mg Oral Q12H Massengill, Harrold Donath, MD   500 mg at 06/02/23 1191   fluPHENAZine (PROLIXIN) tablet 5 mg  5 mg Oral Q12H Massengill, Harrold Donath, MD   5 mg at 06/02/23 4782   Or   fluPHENAZine (PROLIXIN) injection 2.5 mg  2.5 mg Intramuscular Q12H Massengill, Harrold Donath, MD   2.5 mg at 05/30/23 9562   hydrOXYzine (ATARAX) tablet 25 mg  25 mg Oral TID PRN Eligha Bridegroom, NP       magnesium hydroxide (MILK OF MAGNESIA) suspension 30 mL  30 mL Oral Daily PRN Eligha Bridegroom, NP       nicotine (NICODERM CQ - dosed in mg/24 hours) patch 21 mg  21 mg Transdermal Daily Eligha Bridegroom, NP       nicotine polacrilex (NICORETTE) gum 2 mg  2 mg Oral PRN Massengill, Harrold Donath, MD   2 mg at 06/02/23 0822   OLANZapine (ZYPREXA) injection 10 mg  10 mg Intramuscular TID PRN Eligha Bridegroom, NP       OLANZapine (ZYPREXA) injection 5 mg  5 mg Intramuscular TID PRN Eligha Bridegroom, NP       OLANZapine zydis (ZYPREXA) disintegrating tablet 5 mg  5 mg Oral TID PRN Eligha Bridegroom, NP       propranolol (INDERAL) tablet 10 mg  10 mg Oral Q12H Massengill, Nathan, MD   10 mg at 06/02/23 1308   propranolol (INDERAL) tablet 10 mg  10 mg Oral TID PRN Phineas Inches, MD        traZODone (DESYREL) tablet 50 mg  50 mg Oral QHS PRN Eligha Bridegroom, NP       PTA Medications: No medications prior to admission.    Patient Stressors: Health problems   Marital or family conflict   Medication change or noncompliance    Patient Strengths: Average or above average intelligence  Supportive family/friends   Treatment Modalities: Medication Management, Group therapy, Case management,  1 to 1 session with clinician, Psychoeducation, Recreational therapy.   Physician Treatment Plan for Primary Diagnosis: Disorganized schizophrenia (HCC) Long Term Goal(s): Improvement in symptoms so as ready for discharge   Short Term Goals: Ability to identify changes in lifestyle to reduce recurrence of condition will improve Ability to verbalize feelings will improve Ability to disclose and discuss suicidal ideas Ability to demonstrate self-control will improve Ability to identify and develop effective coping behaviors will improve Ability to maintain clinical measurements within normal limits will improve Compliance with prescribed medications will improve Ability to identify triggers associated with substance abuse/mental health issues will improve  Medication Management: Evaluate patient's response, side effects, and tolerance of medication regimen.  Therapeutic Interventions: 1 to 1 sessions, Unit Group sessions and Medication administration.  Evaluation of Outcomes: Progressing  Physician Treatment Plan for  Secondary Diagnosis: Principal Problem:   Disorganized schizophrenia (HCC)  Long Term Goal(s): Improvement in symptoms so as ready for discharge   Short Term Goals: Ability to identify changes in lifestyle to reduce recurrence of condition will improve Ability to verbalize feelings will improve Ability to disclose and discuss suicidal ideas Ability to demonstrate self-control will improve Ability to identify and develop effective coping behaviors will  improve Ability to maintain clinical measurements within normal limits will improve Compliance with prescribed medications will improve Ability to identify triggers associated with substance abuse/mental health issues will improve     Medication Management: Evaluate patient's response, side effects, and tolerance of medication regimen.  Therapeutic Interventions: 1 to 1 sessions, Unit Group sessions and Medication administration.  Evaluation of Outcomes: Progressing   RN Treatment Plan for Primary Diagnosis: Disorganized schizophrenia (HCC) Long Term Goal(s): Knowledge of disease and therapeutic regimen to maintain health will improve  Short Term Goals: Ability to participate in decision making will improve, Ability to verbalize feelings will improve, and Ability to identify and develop effective coping behaviors will improve  Medication Management: RN will administer medications as ordered by provider, will assess and evaluate patient's response and provide education to patient for prescribed medication. RN will report any adverse and/or side effects to prescribing provider.  Therapeutic Interventions: 1 on 1 counseling sessions, Psychoeducation, Medication administration, Evaluate responses to treatment, Monitor vital signs and CBGs as ordered, Perform/monitor CIWA, COWS, AIMS and Fall Risk screenings as ordered, Perform wound care treatments as ordered.  Evaluation of Outcomes: Progressing   LCSW Treatment Plan for Primary Diagnosis: Disorganized schizophrenia (HCC) Long Term Goal(s): Safe transition to appropriate next level of care at discharge, Engage patient in therapeutic group addressing interpersonal concerns.  Short Term Goals: Engage patient in aftercare planning with referrals and resources, Increase ability to appropriately verbalize feelings, Increase emotional regulation, and Facilitate acceptance of mental health diagnosis and concerns  Therapeutic Interventions: Assess  for all discharge needs, 1 to 1 time with Social worker, Explore available resources and support systems, Assess for adequacy in community support network, Educate family and significant other(s) on suicide prevention, Complete Psychosocial Assessment, Interpersonal group therapy.  Evaluation of Outcomes: Progressing   Progress in Treatment: Attending groups: No. Participating in groups: Yes. and As evidenced by:  per chart review Pt has attended 3 of 11 groups  Taking medication as prescribed: No. Toleration medication: No. and As evidenced by:  need to restart medications Family/Significant other contact made: No, will contact:  Pt declined consent for collateral contact Patient understands diagnosis: No. and As evidenced by:  lack of insight regarding mental health symptoms and behaviors Discussing patient identified problems/goals with staff: Yes. Medical problems stabilized or resolved: Yes. Denies suicidal/homicidal ideation: Yes. Issues/concerns per patient self-inventory: No. Other: None   New problem(s) identified: No, Describe:  None   New Short Term/Long Term Goal(s): medication stabilization, elimination of SI thoughts, development of comprehensive mental wellness plan.    Patient Goals:  "All of my goals are outside of here"   Discharge Plan or Barriers: Patient recently admitted. CSW will continue to follow and assess for appropriate referrals and possible discharge planning.      Reason for Continuation of Hospitalization: Medication stabilization   Estimated Length of Stay: 5-7 Days    Last 3 [place] Suicide Severity Risk Score: Flowsheet Row [facility] (Current) [facility] from 12/08/2022 in [facility] ED from 08/18/2022 in [facility]  C-SSRS RISK CATEGORY No Risk No Risk No Risk       Last PHQ 2/9 Scores:    07/11/2022   11:18 AM  06/27/2022    2:09 PM 02/18/2022   12:20 PM  Depression screen PHQ 2/9  Decreased Interest 0 1 0  Down, Depressed, Hopeless 1 0 0  PHQ - 2 Score 1 1 0  Altered sleeping 3 2 0  Tired, decreased energy 2 2 0  Change in appetite 1 1 0  Feeling bad or failure about yourself  0 0 0  Trouble concentrating 3 1 0  Moving slowly or fidgety/restless 3 2 0  Suicidal thoughts 0 0 0  PHQ-9 Score 13 9 0  Difficult doing work/chores   Not difficult at all    Scribe for Treatment Team: Jacinta Shoe, LCSW 06/02/2023 11:56 AM

## 2023-06-02 NOTE — Group Note (Signed)
LCSW Group Therapy Note   Group Date: 06/02/2023 Start Time: 1300 End Time: 1400   Participation: did not attend  Type of Therapy:  Group Therapy   Topic:  "Stronger Together: Improving Interpersonal Skills for Better Relationships"  Objective: To enhance interpersonal skills by improving communication, setting healthy boundaries, and fostering positive relationships.  Goals: Identify healthy and unhealthy relationship qualities. Practice active listening and assertive communication. Develop an action plan for setting boundaries and improving relationships.  Summary: In this class, participants explored key elements of building strong, healthy relationships. They learned about different communication styles, practiced active listening, and discussed the importance of setting boundaries. The group created actionable steps to improve their interpersonal skills and relationships.  Therapeutic Modalities Used: Elements of Cognitive Behavioral Therapy (reframing and thought restructuring, problem solving) Elements of DBT (mindfulness)   Brendan Hogan, LCSWA 06/02/2023  7:11 PM

## 2023-06-02 NOTE — [facility] Suicide Risk Assessment (Signed)
[facility] INPATIENT:  Family/Significant Other Suicide Prevention Education  Suicide Prevention Education:  Education Completed; Erenest Rasher (father) [phone]  (name of family member/significant other) has been identified by the patient as the family member/significant other with whom the patient will be residing, and identified as the person(s) who will aid the patient in the event of a mental health crisis (suicidal ideations/suicide attempt).  With written consent from the patient, the family member/significant other has been provided the following suicide prevention education, prior to the and/or following the discharge of the patient.  On 05/30/2023, patient asked CSW to speak with his father but refused to sign the document due to paranoia.  Verbal permission was obtained instead.  When asked why patient came to the hospital, father said that patient hadn't slept or eaten for 5 days, was physically aggressive, was talking to himself, and was argumentative with the neighbor.  When asked if he visits patient, father said that he visits him every day.  When asked if the patient is improving, father explained that the issue is that when he is discharged, he doesn't take his medications.  He hasn't taken his medications for 4 months before being admitted to the hospital.  When asked if they have any weapons, the father said no.  He said that he will secure the medications and sharp objects (such as knives and scissors).  When asked about safety concerns when patient comes back home, father said that patient "doesn't listen to [place]" and will not take his medications.  People in the home include:  father, mother, and patient.  The suicide prevention education provided includes the following: Suicide risk factors Suicide prevention and interventions National Suicide Hotline telephone number [facility] assessment telephone number [facility] Emergency Assistance 911 [facility] and/or  Residential Mobile Crisis Unit telephone number  Request made of family/significant other to: Remove weapons (e.g., guns, rifles, knives), all items previously/currently identified as safety concern.   Remove drugs/medications (over-the-counter, prescriptions, illicit drugs), all items previously/currently identified as a safety concern.  The family member/significant other verbalizes understanding of the suicide prevention education information provided.  The family member/significant other agrees to remove the items of safety concern listed above.  Evamarie Raetz O Cuahutemoc Attar 06/02/2023, 5:34 PM

## 2023-06-02 NOTE — Group Note (Signed)
Recreation Therapy Group Note   Group Topic:Health and Wellness  Group Date: 06/02/2023 Start Time: 1013 End Time: 1040 Facilitators: Lucyann Romano-McCall, LRT,CTRS Location: [address] Dayroom   Group Topic: Wellness  Goal Area(s) Addresses:  Patient will define components of whole wellness. Patient will verbalize benefit of whole wellness.  Intervention: Music  Activity: Exercise. LRT and patients discussed the importance of exercise and wellness. LRT and patients engaged in chair exercises to accommodate each patient. Patients took turns leading the group in various exercises. Patients were encouraged to do as much as possible without straining themselves.    Education: Wellness, Building control surveyor.   Education Outcome: Acknowledges education/In group clarification offered/Needs additional education.    Affect/Mood: N/A   Participation Level: Did not attend    Clinical Observations/Individualized Feedback:      Plan: Continue to engage patient in RT group sessions 2-3x/week.   Shawndale Kilpatrick-McCall, LRT,CTRS 06/02/2023 12:24 PM

## 2023-06-03 DIAGNOSIS — F201 Disorganized schizophrenia: Secondary | ICD-10-CM | POA: Diagnosis not present

## 2023-06-03 NOTE — Plan of Care (Signed)
   Problem: Education: Goal: Emotional status will improve Outcome: Progressing Goal: Mental status will improve Outcome: Progressing   Problem: Activity: Goal: Interest or engagement in activities will improve Outcome: Progressing Goal: Sleeping patterns will improve Outcome: Progressing

## 2023-06-03 NOTE — Progress Notes (Addendum)
 Collateral contact - [facility] [phone]  CSW called [facility] and discovered that patient refused services months ago and is currently not receiving any.  When asked if he can start receiving services, the person on the phone replied that she will check with someone.   Collateral contact - Tyron Manetta (father) [phone]  CSW attempted to call dad twice, but there was no response.    Dad called CSW back and said that he will pick up patient tomorrow, 2/5.   Conversation with patient: CSW spoke with the patient, who indicated that he doesn't need the ACT Team.  He said that he will be discharged tomorrow and his dad will pick him up.  Janai Brannigan, LCSWA 06/03/2023

## 2023-06-03 NOTE — Progress Notes (Signed)
   06/03/23 1945  Psych Admission Type (Psych Patients Only)  Admission Status Involuntary  Psychosocial Assessment  Patient Complaints Suspiciousness  Eye Contact Brief  Facial Expression Anxious;Animated  Affect Anxious;Apprehensive  Speech Logical/coherent  Interaction Cautious;Guarded  Motor Activity Slow  Appearance/Hygiene Unremarkable  Behavior Characteristics Appropriate to situation  Mood Suspicious  Aggressive Behavior  Effect No apparent injury  Thought Process  Coherency Circumstantial  Content Preoccupation  Delusions Paranoid  Perception Hallucinations  Hallucination Auditory  Judgment WDL  Confusion None  Danger to Self  Current suicidal ideation? Denies  Danger to Others  Danger to Others None reported or observed

## 2023-06-03 NOTE — Group Note (Signed)
 Date:  06/03/2023 Time:  9:04 PM  Group Topic/Focus:  Wrap-Up Group:   The focus of this group is to help patients review their daily goal of treatment and discuss progress on daily workbooks.    Participation Level:  Active  Participation Quality:  Appropriate  Affect:  Appropriate  Cognitive:  Appropriate  Insight: Appropriate  Engagement in Group:  Engaged  Modes of Intervention:  Education and Exploration  Additional Comments:  Patient attended and participated in group tonight. He reports that he had fook went outside and and had a visitor  Saleah Rishel Dacosta 06/03/2023, 9:04 PM

## 2023-06-03 NOTE — Progress Notes (Signed)
   06/03/23 1200  Psych Admission Type (Psych Patients Only)  Admission Status Involuntary  Psychosocial Assessment  Patient Complaints Suspiciousness  Eye Contact Brief  Facial Expression Anxious  Affect Apprehensive  Speech Logical/coherent  Interaction Guarded  Motor Activity Slow  Appearance/Hygiene Unremarkable  Behavior Characteristics Appropriate to situation  Mood Suspicious  Thought Process  Coherency Circumstantial  Content Preoccupation  Delusions Paranoid  Perception Hallucinations  Hallucination Auditory  Judgment Impaired  Confusion None  Danger to Self  Current suicidal ideation? Denies  Danger to Others  Danger to Others None reported or observed

## 2023-06-03 NOTE — Plan of Care (Signed)
   Problem: Education: Goal: Emotional status will improve Outcome: Progressing Goal: Mental status will improve Outcome: Progressing

## 2023-06-03 NOTE — Progress Notes (Signed)
 [facility] MD Progress Note  06/03/2023 3:28 PM Brendan Hogan  MRN:  [id number]  HPI: 28 year old male, with a psychiatric history significant for polysubstance abuse, psychosis, schizophrenia, and multiple psychiatric hospitalizations. According to family reports, over the past week, he has exhibited decreased oral intake, increased self-talking, and escalating aggression. It has been more than a week since he last took his psychotropic medications. The family further reported that he became combative at home, prompting them to contact law enforcement. Upon police arrival, the patient continued to display combative behavior, leading to his transport to [name] ED on January 22 under involuntary commitment (IVC).   Patient assessment note:  On assessment today, the pt reports that their mood is euthymic, improved since admission, and stable. Denies feeling down, depressed, or sad.  Reports that anxiety symptoms are at manageable level.  Sleep is stable. Appetite is stable.  Concentration is without complaint.  Energy level is adequate. Denies having any suicidal thoughts. Denies having any suicidal intent and plan.  Denies having any HI.  Denies having psychotic symptoms.   Denies having side effects to current psychiatric medications.  Discussed discharge planning for 2/5 after Depakote  levels are drawn, safety planning completed, and discharge f/u appts made for continuity of care outpatient.    Labs Reviewed: Depakote  Level: Scheduled for 2/5 along with BMP & repeat EKG.  Problem: Disorganized schizophrenia (HCC) Diagnosis: Principal Problem:   Disorganized schizophrenia (HCC)  Total Time spent with patient: 20 minutes  Past Psychiatric History:  Polysubstance abuse, psychosis, schizophrenia, noncompliance of medication and multiple psychiatric hospitalizations.   Past Medical History:  Past Medical History:  Diagnosis Date   Psychosis (HCC) 05/2019   [facility]     Seasonal allergies     Past Surgical History:  Procedure Laterality Date   TONSILLECTOMY     Family History:  Family History  Problem Relation Age of Onset   Hypertension Mother    Family Psychiatric  History: See H&P Social History:  Social History   Substance and Sexual Activity  Alcohol Use Not Currently   Comment: rare     Social History   Substance and Sexual Activity  Drug Use No    Social History   Socioeconomic History   Marital status: Single    Spouse name: Not on file   Number of children: Not on file   Years of education: Not on file   Highest education level: Not on file  Occupational History   Not on file  Tobacco Use   Smoking status: Every Day    Current packs/day: 1.00    Types: Cigarettes   Smokeless tobacco: Former  Building Services Engineer status: Never Used  Substance and Sexual Activity   Alcohol use: Not Currently    Comment: rare   Drug use: No   Sexual activity: Not Currently  Other Topics Concern   Not on file  Social History Narrative   Not on file   Social Drivers of Health   Financial Resource Strain: Not on file  Food Insecurity: Patient Declined (05/27/2023)   Hunger Vital Sign    Worried About Running Out of Food in the Last Year: Patient declined    Ran Out of Food in the Last Year: Patient declined  Transportation Needs: Patient Declined (05/27/2023)   PRAPARE - Administrator, Civil Service (Medical): Patient declined    Lack of Transportation (Non-Medical): Patient declined  Physical Activity: Not on file  Stress: Not on file  Social Connections: Not on file   Current Medications: Current Facility-Administered Medications  Medication Dose Route Frequency Provider Last Rate Last Admin   acetaminophen  (TYLENOL ) tablet 650 mg  650 mg Oral Q6H PRN Mardy Legacy, NP       alum & mag hydroxide-simeth (MAALOX/MYLANTA) 200-200-20 MG/5ML suspension 30 mL  30 mL Oral Q4H PRN Mardy Legacy, NP       benztropine   (COGENTIN ) tablet 0.5 mg  0.5 mg Oral BID Massengill, Rankin, MD   0.5 mg at 06/03/23 9187   divalproex  (DEPAKOTE ) DR tablet 500 mg  500 mg Oral Q12H Massengill, Rankin, MD   500 mg at 06/03/23 9187   fluPHENAZine  (PROLIXIN ) tablet 5 mg  5 mg Oral Q12H Massengill, Rankin, MD   5 mg at 06/03/23 9187   Or   fluPHENAZine  (PROLIXIN ) injection 2.5 mg  2.5 mg Intramuscular Q12H Massengill, Rankin, MD   2.5 mg at 05/30/23 9147   hydrOXYzine  (ATARAX ) tablet 25 mg  25 mg Oral TID PRN Mardy Legacy, NP       magnesium  hydroxide (MILK OF MAGNESIA) suspension 30 mL  30 mL Oral Daily PRN Mardy Legacy, NP       nicotine  (NICODERM CQ  - dosed in mg/24 hours) patch 21 mg  21 mg Transdermal Daily Mardy Legacy, NP   21 mg at 06/03/23 0812   nicotine  polacrilex (NICORETTE ) gum 2 mg  2 mg Oral PRN Massengill, Nathan, MD   2 mg at 06/02/23 1449   OLANZapine  (ZYPREXA ) injection 10 mg  10 mg Intramuscular TID PRN Mardy Legacy, NP       OLANZapine  (ZYPREXA ) injection 5 mg  5 mg Intramuscular TID PRN Mardy Legacy, NP       OLANZapine  zydis (ZYPREXA ) disintegrating tablet 5 mg  5 mg Oral TID PRN Mardy Legacy, NP       propranolol  (INDERAL ) tablet 10 mg  10 mg Oral TID PRN Massengill, Rankin, MD       propranolol  (INDERAL ) tablet 5 mg  5 mg Oral Q12H Massengill, Nathan, MD       traZODone  (DESYREL ) tablet 50 mg  50 mg Oral QHS PRN Mardy Legacy, NP        Lab Results: No results found for this or any previous visit (from the past 48 hours).  Blood Alcohol level:  Lab Results  Component Value Date   ETH <10 05/21/2023   ETH <10 08/18/2022    Metabolic Disorder Labs: Lab Results  Component Value Date   HGBA1C 5.3 12/09/2022   MPG 105 12/09/2022   MPG 108 05/02/2022   Lab Results  Component Value Date   PROLACTIN 26.5 (H) 09/08/2019   Lab Results  Component Value Date   CHOL 150 12/09/2022   TRIG 95 12/09/2022   HDL 40 (L) 12/09/2022   CHOLHDL 3.8 12/09/2022   VLDL 19 12/09/2022    LDLCALC 91 12/09/2022   LDLCALC 96 05/02/2022    Physical Findings: AIMS:  , ,  ,  ,    CIWA:    COWS:     Patient is obviously feigning tremors, having voluntary movements off and on when discussing his medications.  Musculoskeletal: Strength & Muscle Tone: within normal limits Gait & Station: normal Patient leans: N/A  Psychiatric Specialty Exam:  Presentation  General Appearance:  Fairly Groomed  Eye Contact: Fair  Speech: Clear and Coherent  Speech Volume: Normal  Handedness: Right   Mood and Affect  Mood: Euthymic  Affect:  Congruent   Thought Process  Thought Processes: Coherent  Descriptions of Associations:Intact  Orientation:Full (Time, Place and Person)  Thought Content:Logical; WDL  History of Schizophrenia/Schizoaffective disorder:Yes  Duration of Psychotic Symptoms:Greater than six months  Hallucinations:Hallucinations: None   Ideas of Reference:None  Suicidal Thoughts:Suicidal Thoughts: No   Homicidal Thoughts:Homicidal Thoughts: No    Sensorium  Memory: Immediate Fair  Judgment: Fair  Insight: Fair   Art Therapist  Concentration: Fair  Attention Span: Fair  Recall: [employer] of Knowledge: Fair  Language: Fair   Psychomotor Activity  Psychomotor Activity: Psychomotor Activity: Normal    Assets  Assets: Housing; Resilience; Social Support   Sleep  Sleep: Sleep: Good     Physical Exam: Physical Exam Vitals reviewed.  Constitutional:      General: He is not in acute distress.    Appearance: He is normal weight. He is not toxic-appearing.  Pulmonary:     Effort: Pulmonary effort is normal. No respiratory distress.  Neurological:     Mental Status: He is alert.     Motor: No weakness.     Gait: Gait normal.  Psychiatric:        Behavior: Behavior normal.    Review of Systems  Constitutional:  Negative for chills and fever.  Cardiovascular:  Negative for chest pain and  palpitations.  Neurological:  Negative for dizziness, tingling, tremors and headaches.  Psychiatric/Behavioral:  Positive for substance abuse. Negative for depression, hallucinations, memory loss and suicidal ideas. The patient is nervous/anxious. The patient does not have insomnia.        +paranoia  All other systems reviewed and are negative.  Blood pressure 91/64, pulse 82, temperature 98 F (36.7 C), temperature source Oral, resp. rate 16, height 5' 5 (1.651 m), weight 58.5 kg, SpO2 100%. Body mass index is 21.47 kg/m.   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 Psychiatric Diagnoses and Treatment:    Involuntary Medication Order (give IM if patient refuses oral) -- Prolixin  5 mg, oral, every 12 hours OR  -- Prolixin  injection 2.5 mg, IM, every 12 hours  -- Continue Cogentin  0.5 mg, oral, 2 times daily  --Continue propranolol  10 mg twice daily for akathisia or EPS secondary to Prolixin .  Patient request medication for this reported side effect, specifically Ambien .  We discussed starting a noncontrolled substance if patient is experiencing the symptoms and he is ultimately agreeable to start propranolol .  We will also start a propranolol  10 mg as needed dose for breakthrough symptoms.  -- Continue Depakote  DR, 500 mg, oral, every 12 hours -valproic acid  level due 2/4 in the morning, already  ordered  -- Previously discontinued mirtazapine  15 mg, oral, Daily at bedtime - dc due to patient preference  --Continue hydroxyzine  25 mg, oral, 3 times daily as needed, anxiety  -- Continue trazodone  50 mg, oral, at bedtime as needed, sleep   --  The risks/benefits/side-effects/alternatives to this medication were discussed in detail with the patient  and time was given for questions. The patient consents to medication trial.    -- Metabolic profile and EKG monitoring obtained while on an atypical antipsychotic (BMI: Lipid Panel: HbgA1c: QTc:)   -- Encouraged patient to participate in unit milieu and in scheduled group therapies   -- Short Term Goals: Ability to identify changes in lifestyle to reduce recurrence of condition will improve, Ability to verbalize feelings will improve, Ability to disclose and discuss suicidal ideas, Ability to demonstrate self-control will improve, Ability to identify and develop effective coping behaviors will improve, Ability to maintain clinical measurements within normal limits will improve, Compliance with prescribed medications will improve, and Ability to identify triggers associated with substance abuse/mental health issues will improve  -- Long Term Goals: Improvement in symptoms so as ready for discharge    3. Medical Issues Being Addressed:   Tobacco Use Disorder  -- Nicotine  patch 21mg /24 hours ordered  -- Smoking cessation encouraged  4. Discharge Planning:   -- Social work and case management to assist with discharge planning and identification of hospital follow-up needs prior to discharge   -- Estimated LOS: Probably Wednesday 2/5, after result a valproic acid  level   -- Discharge Concerns: Need to establish a safety plan; Medication compliance and effectiveness  -- Discharge Goals: Return home with outpatient referrals for mental health follow-up including medication management/psychotherapy   Donia Snell, NP 06/03/2023, 3:28 PM  Total  Time Spent in Direct Patient Care:    Patient ID: Brendan Hogan, male   DOB: 1995-07-17, 28 y.o.   MRN: [id number] Patient ID: JOHNSON ARIZOLA, male   DOB: 27-Jun-1995, 27 y.o.   MRN: [id number]

## 2023-06-04 DIAGNOSIS — F201 Disorganized schizophrenia: Secondary | ICD-10-CM | POA: Diagnosis not present

## 2023-06-04 LAB — COMPREHENSIVE METABOLIC PANEL
ALT: 14 U/L (ref 0–44)
AST: 15 U/L (ref 15–41)
Albumin: 3.7 g/dL (ref 3.5–5.0)
Alkaline Phosphatase: 81 U/L (ref 38–126)
Anion gap: 9 (ref 5–15)
BUN: 7 mg/dL (ref 6–20)
CO2: 26 mmol/L (ref 22–32)
Calcium: 8.8 mg/dL — ABNORMAL LOW (ref 8.9–10.3)
Chloride: 104 mmol/L (ref 98–111)
Creatinine, Ser: 0.86 mg/dL (ref 0.61–1.24)
GFR, Estimated: >60 mL/min (ref >60)
Glucose, Bld: 88 mg/dL (ref 70–99)
Potassium: 3.8 mmol/L (ref 3.5–5.1)
Sodium: 139 mmol/L (ref 135–145)
Total Bilirubin: 1 mg/dL (ref 0.0–1.2)
Total Protein: 6.5 g/dL (ref 6.5–8.1)

## 2023-06-04 LAB — HEPATIC FUNCTION PANEL
ALT: 14 U/L (ref 0–44)
AST: 15 U/L (ref 15–41)
Albumin: 3.9 g/dL (ref 3.5–5.0)
Alkaline Phosphatase: 76 U/L (ref 38–126)
Bilirubin, Direct: 0.1 mg/dL (ref 0.0–0.2)
Indirect Bilirubin: 0.7 mg/dL (ref 0.3–0.9)
Total Bilirubin: 0.8 mg/dL (ref 0.0–1.2)
Total Protein: 6.8 g/dL (ref 6.5–8.1)

## 2023-06-04 LAB — VALPROIC ACID LEVEL: Valproic Acid Lvl: 110 ug/mL — ABNORMAL HIGH (ref 50.0–100.0)

## 2023-06-04 NOTE — Group Note (Signed)
 Date:  06/04/2023 Time:  9:12 PM  Group Topic/Focus:  Wrap-Up Group:   The focus of this group is to help patients review their daily goal of treatment and discuss progress on daily workbooks.    Participation Level:  Active  Participation Quality:  Appropriate  Affect:  Appropriate  Cognitive:  Appropriate  Insight: Appropriate  Engagement in Group:  Developing/Improving  Modes of Intervention:  Discussion  Additional Comments:  Pt stated his goal for today was to focus on his treatment plan and discuss his discharge plan with his treatment team. Pt stated he accomplished his goals today. Pt stated he talked with his doctor and social worker about his care today. Pt rated his overall day a 7 out of 10. Pt stated the plan is for him to get some rest tonight and discharge on 06/05/23. Writer encourage pt to follow his treatment plan. Pt stated he made no calls today. Pt stated he felt better about himself today. Pt stated he was able to attend all meals. Pt stated he took all medications provided today. Pt stated he attend all groups held today. Pt stated his appetite was pretty good today. Pt rated sleep last night was pretty good. Pt stated the goal tonight was to get some rest. Pt stated he had no physical pain tonight. Pt deny visual hallucinations and auditory issues tonight. Pt denies thoughts of harming himself or others. Pt stated he would alert staff if anything changed  Lonni Na 06/04/2023, 9:12 PM

## 2023-06-04 NOTE — Progress Notes (Signed)
 [facility] Progress Note  06/04/2023 1:53 PM [facility]  MRN:  [id number]  HPI: 28 year old male, with a psychiatric history significant for polysubstance abuse, psychosis, schizophrenia, and multiple psychiatric hospitalizations. According to family reports, over the past week, he has exhibited decreased oral intake, increased self-talking, and escalating aggression. It has been more than a week since he last took his psychotropic medications. The family further reported that he became combative at home, prompting them to contact law enforcement. Upon police arrival, the patient continued to display combative behavior, leading to his transport to [facility] on January 22 under involuntary commitment (IVC).   Patient assessment note:  On assessment today, patient denies SI/HI/AVH.  He denies paranoia, responds if you wanna play, I'll play too, in response to writer telling him that he is discharge would not be happening today, due to a repeat Depakote  level needing to be drawn tomorrow morning.  Patient states that he had been looking forward to going home today.  Valproic acid  level today was 110, and pt is only on 500 mg or Depakote  DR BID.  Patient is complaining of feeling tired today, has mostly been in bed, and isolative to his room, which has been typical for him over the course of this hospitalization.  He denies feelings of weakness or dizziness.  Initial plan was to discharge him today, but we will switch his discharge to tomorrow after a repeat Depakote  level is completed.  Patient's appointment for outpatient management is on 02/22, and if discharged today, repeat Depakote  level would not be until then. We will then draw a level tomorrow prior to his discharge.   He is continuing to report a good sleep quality, reports a good appetite, denies being in any physical pain, denies issues with bowel movements.   Labs Reviewed: Depakote  Level: Scheduled for 2/6. BMP level WNL & repeat EKG  for trending ordered, and pending.  Problem: Disorganized schizophrenia (HCC) Diagnosis: Principal Problem:   Disorganized schizophrenia (HCC)  Total Time spent with patient: 45 minutes  Past Psychiatric History:  Polysubstance abuse, psychosis, schizophrenia, noncompliance of medication and multiple psychiatric hospitalizations.   Past Medical History:  Past Medical History:  Diagnosis Date   Psychosis (HCC) 05/2019   [facility]    Seasonal allergies     Past Surgical History:  Procedure Laterality Date   TONSILLECTOMY     Family History:  Family History  Problem Relation Age of Onset   Hypertension Mother    Family Psychiatric  History: See H&P Social History:  Social History   Substance and Sexual Activity  Alcohol Use Not Currently   Comment: rare     Social History   Substance and Sexual Activity  Drug Use No    Social History   Socioeconomic History   Marital status: Single    Spouse name: Not on file   Number of children: Not on file   Years of education: Not on file   Highest education level: Not on file  Occupational History   Not on file  Tobacco Use   Smoking status: Every Day    Current packs/day: 1.00    Types: Cigarettes   Smokeless tobacco: Former  Building Services Engineer status: Never Used  Substance and Sexual Activity   Alcohol use: Not Currently    Comment: rare   Drug use: No   Sexual activity: Not Currently  Other Topics Concern   Not on file  Social History  Narrative   Not on file   Social Drivers of Health   Financial Resource Strain: Not on file  Food Insecurity: Patient Declined (05/27/2023)   Hunger Vital Sign    Worried About Running Out of Food in the Last Year: Patient declined    Ran Out of Food in the Last Year: Patient declined  Transportation Needs: Patient Declined (05/27/2023)   PRAPARE - Administrator, Civil Service (Medical): Patient declined    Lack of Transportation (Non-Medical):  Patient declined  Physical Activity: Not on file  Stress: Not on file  Social Connections: Not on file   Current Medications: Current Facility-Administered Medications  Medication Dose Route Frequency Provider Last Rate Last Admin   acetaminophen  (TYLENOL ) tablet 650 mg  650 mg Oral Q6H PRN [facility], NP       alum & mag hydroxide-simeth (MAALOX/MYLANTA) 200-200-20 MG/5ML suspension 30 mL  30 mL Oral Q4H PRN [facility], NP       benztropine  (COGENTIN ) tablet 0.5 mg  0.5 mg Oral BID [facility]   0.5 mg at 06/04/23 0751   divalproex  (DEPAKOTE ) DR tablet 500 mg  500 mg Oral Q12H [facility]   500 mg at 06/04/23 0751   fluPHENAZine  (PROLIXIN ) tablet 5 mg  5 mg Oral Q12H [facility]   5 mg at 06/04/23 9248   Or   fluPHENAZine  (PROLIXIN ) injection 2.5 mg  2.5 mg Intramuscular Q12H [facility]   2.5 mg at 05/30/23 9147   hydrOXYzine  (ATARAX ) tablet 25 mg  25 mg Oral TID PRN [facility] Legacy, NP       magnesium  hydroxide (MILK OF MAGNESIA) suspension 30 mL  30 mL Oral Daily PRN Brendan Legacy, NP       nicotine  (NICODERM CQ  - dosed in mg/24 hours) patch 21 mg  21 mg Transdermal Daily Brendan Legacy, NP   21 mg at 06/03/23 9187   nicotine  polacrilex (NICORETTE ) gum 2 mg  2 mg Oral PRN [facility], [facility]   2 mg at 06/03/23 1728   OLANZapine  (ZYPREXA ) injection 10 mg  10 mg Intramuscular TID PRN [facility] Legacy, NP       OLANZapine  (ZYPREXA ) injection 5 mg  5 mg Intramuscular TID PRN [facility], NP       OLANZapine  zydis (ZYPREXA ) disintegrating tablet 5 mg  5 mg Oral TID PRN [facility] Legacy, NP       propranolol  (INDERAL ) tablet 10 mg  10 mg Oral TID PRN [facility], [facility]       propranolol  (INDERAL ) tablet 5 mg  5 mg Oral Q12H [facility], Nathan, [facility]   5 mg at 06/04/23 9248   traZODone  (DESYREL ) tablet 50 mg  50 mg Oral QHS PRN [facility], NP        Lab Results:  Results for orders placed or performed during the  hospital encounter of 05/27/23 (from the past 48 hours)  Valproic acid  level     Status: Abnormal   Collection Time: 06/04/23  6:24 AM  Result Value Ref Range   Valproic Acid  Lvl 110 (H) 50.0 - 100.0 ug/mL    Comment: Performed at [facility], [address], [place], [place] [address]  Hepatic function panel     Status: None   Collection Time: 06/04/23  6:24 AM  Result Value Ref Range   Total Protein 6.8 6.5 - 8.1 g/dL   Albumin 3.9 3.5 - 5.0 g/dL   AST 15 15 - 41  U/L   ALT 14 0 - 44 U/L   Alkaline Phosphatase 76 38 - 126 U/L   Total Bilirubin 0.8 0.0 - 1.2 mg/dL   Bilirubin, Direct 0.1 0.0 - 0.2 mg/dL   Indirect Bilirubin 0.7 0.3 - 0.9 mg/dL    Comment: Performed at [facility], [address], [place], [place] [address]    Blood Alcohol level:  Lab Results  Component Value Date   [facility] <10 05/21/2023   ETH <10 08/18/2022    Metabolic Disorder Labs: Lab Results  Component Value Date   HGBA1C 5.3 12/09/2022   MPG 105 12/09/2022   MPG 108 05/02/2022   Lab Results  Component Value Date   PROLACTIN 26.5 (H) 09/08/2019   Lab Results  Component Value Date   CHOL 150 12/09/2022   TRIG 95 12/09/2022   HDL 40 (L) 12/09/2022   CHOLHDL 3.8 12/09/2022   VLDL 19 12/09/2022   LDLCALC 91 12/09/2022   LDLCALC 96 05/02/2022    Physical Findings: AIMS:  , ,  ,  ,    CIWA:    COWS:     Patient is obviously feigning tremors, having voluntary movements off and on when discussing his medications.  Musculoskeletal: Strength & Muscle Tone: within normal limits Gait & Station: normal Patient leans: N/A  Psychiatric Specialty Exam:  Presentation  General Appearance:  Fairly Groomed  Eye Contact: Fair  Speech: Clear and Coherent  Speech Volume: Normal  Handedness: Right   Mood and Affect  Mood: Depressed  Affect: Congruent   Thought Process  Thought Processes: Coherent  Descriptions of  Associations:Intact  Orientation:Full (Time, Place and Person)  Thought Content:Logical  History of Schizophrenia/Schizoaffective disorder:Yes  Duration of Psychotic Symptoms:Greater than six months  Hallucinations:Hallucinations: None   Ideas of Reference:None  Suicidal Thoughts:Suicidal Thoughts: No   Homicidal Thoughts:Homicidal Thoughts: No    Sensorium  Memory: Immediate Fair  Judgment: Fair  Insight: Fair   Art Therapist  Concentration: Fair  Attention Span: Fair  Recall: Fair  Fund of Knowledge: Fair  Language: Fair   Psychomotor Activity  Psychomotor Activity: Psychomotor Activity: Normal    Assets  Assets: Resilience   Sleep  Sleep: Sleep: Good     Physical Exam: Physical Exam Vitals reviewed.  Constitutional:      General: He is not in acute distress.    Appearance: He is normal weight. He is not toxic-appearing.  Pulmonary:     Effort: Pulmonary effort is normal. No respiratory distress.  Neurological:     Mental Status: He is alert.     Motor: No weakness.     Gait: Gait normal.  Psychiatric:        Behavior: Behavior normal.    Review of Systems  Constitutional:  Negative for chills and fever.  Cardiovascular:  Negative for chest pain and palpitations.  Neurological:  Negative for dizziness, tingling, tremors and headaches.  Psychiatric/Behavioral:  Positive for substance abuse. Negative for depression, hallucinations, memory loss and suicidal ideas. The patient is nervous/anxious. The patient does not have insomnia.        +paranoia  All other systems reviewed and are negative.  Blood pressure 117/69, pulse 68, temperature (!) 97.3 F (36.3 C), temperature source Oral, resp. rate 16, height 5' 5 (1.651 m), weight 58.5 kg, SpO2 100%. Body mass index is 21.47 kg/m.   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 Psychiatric Diagnoses and Treatment:    Involuntary Medication Order (give IM if patient refuses oral) -- Prolixin  5 mg, oral, every 12 hours OR  -- Prolixin  injection 2.5 mg, IM, every 12 hours  -- Continue Cogentin  0.5 mg, oral, 2 times daily  --Continue propranolol  10 mg twice daily for akathisia or EPS secondary to Prolixin .  Patient request medication for this reported side effect, specifically Ambien .  We discussed starting a noncontrolled substance if patient is experiencing the symptoms and he is ultimately agreeable to start propranolol .  We will also start a propranolol  10 mg as needed dose for breakthrough symptoms.  -- Continue Depakote  DR, 500 mg, oral, every 12 hours -valproic acid  level due 2/4 in the morning, already ordered  -- Previously discontinued mirtazapine  15 mg, oral, Daily at bedtime - dc due to patient preference  --Continue hydroxyzine  25 mg, oral, 3 times daily as needed, anxiety  -- Continue trazodone  50 mg, oral, at bedtime as needed, sleep   --  The risks/benefits/side-effects/alternatives to this medication were discussed in detail with the patient and time was given for questions. The patient consents to medication trial.    -- Metabolic profile and EKG monitoring obtained while on an atypical antipsychotic (BMI: Lipid Panel: HbgA1c: QTc:)   -- Encouraged patient to participate in unit milieu and in scheduled group therapies   -- Short Term  Goals: Ability to identify changes in lifestyle to reduce recurrence of condition will improve, Ability to verbalize feelings will improve, Ability to disclose and discuss suicidal ideas, Ability to demonstrate self-control will improve, Ability to identify and develop effective coping behaviors will improve, Ability to maintain clinical measurements within normal limits will improve, Compliance with prescribed medications will improve, and Ability to identify triggers associated with substance abuse/mental health issues will improve  -- Long Term Goals: Improvement in symptoms so as ready for discharge    3. Medical Issues Being Addressed:   Tobacco Use Disorder  -- Nicotine  patch 21mg /24 hours ordered  -- Smoking cessation encouraged  4. Discharge Planning:   -- Social work and case management to assist with discharge planning and identification of hospital follow-up needs prior to discharge   -- Estimated LOS: Probably Wednesday 2/5, after result a valproic acid  level   -- Discharge Concerns: Need to establish a safety plan; Medication compliance and effectiveness  -- Discharge Goals: Return home with outpatient referrals for mental health follow-up including medication management/psychotherapy   Donia Snell, NP 06/04/2023, 1:53 PM  Total Time Spent in Direct Patient Care:

## 2023-06-04 NOTE — Group Note (Signed)
 Recreation Therapy Group Note   Group Topic:Self-Esteem  Group Date: 06/04/2023 Start Time: 1030 End Time: 1114 Facilitators: Miray Mancino-McCall, LRT,CTRS Location: [address] Dayroom   Group Topic: Self-Esteem   Goal Area(s) Addresses:  Patient will successfully identify positive attributes about themselves.  Patient will identify healthy ways to increase self-esteem. Patient will acknowledge benefit(s) of improved self-esteem.    Intervention: Worksheets, Markers   Activity: Pictures of Me. LRT and patients discussed the importance of self esteem and its benefits. Patients were given a worksheet that was broken up into four picture frames. Patients were to identify four ways in which they present themselves. Patients were to then draw a picture that represented the identities they chose.   Education:  Self-Esteem, Discharge Planning   Education Outcome: Acknowledges education/In group clarification offered/Needs additional education   Affect/Mood: N/A   Participation Level: Did not attend    Clinical Observations/Individualized Feedback:     Plan: Continue to engage patient in RT group sessions 2-3x/week.   Akari Defelice-McCall, LRT,CTRS 06/04/2023 2:03 PM

## 2023-06-04 NOTE — Plan of Care (Signed)
 Pt presents with anxious/preoccupied affect in regard to discharge plans. Pt complains of restlessness and trouble sleeping last night r/t to discharge concern but reports good appetite. Denies SI, HI, AVH, and physical pain. Assertive and fidgety in interaction with staff but remains cooperative and pleasant. Isolative to room and did not attend or participate in group. Medication compliant with no adverse reactions. Safety checks maintained at q 15 minutes. Support, encouragement, and reassurance offered to pt.   Problem: Education: Goal: Mental status will improve Outcome: Progressing Goal: Verbalization of understanding the information provided will improve Outcome: Progressing   Problem: Safety: Goal: Periods of time without injury will increase Outcome: Progressing

## 2023-06-04 NOTE — Plan of Care (Signed)
  Problem: Education: Goal: Emotional status will improve Outcome: Progressing Goal: Mental status will improve Outcome: Progressing   Problem: Activity: Goal: Interest or engagement in activities will improve Outcome: Progressing   Problem: Coping: Goal: Ability to demonstrate self-control will improve Outcome: Progressing   Problem: Safety: Goal: Periods of time without injury will increase Outcome: Progressing   Problem: Education: Goal: Emotional status will improve Outcome: Progressing

## 2023-06-04 NOTE — Progress Notes (Signed)
   06/04/23 2000  Psych Admission Type (Psych Patients Only)  Admission Status Involuntary  Psychosocial Assessment  Patient Complaints Restlessness;Sleep disturbance  Eye Contact Brief  Facial Expression Anxious;Animated  Affect Anxious;Apprehensive  Speech Logical/coherent  Interaction Cautious;Guarded  Motor Activity Slow  Appearance/Hygiene Unremarkable  Behavior Characteristics Hyperactive;Cooperative  Mood Anxious;Suspicious;Pleasant  Aggressive Behavior  Effect No apparent injury  Thought Process  Coherency Circumstantial  Content Preoccupation  Delusions Paranoid  Perception Hallucinations  Hallucination Auditory  Judgment WDL  Confusion None  Danger to Self  Current suicidal ideation? Denies  Danger to Others  Danger to Others None reported or observed

## 2023-06-05 LAB — VALPROIC ACID LEVEL: Valproic Acid Lvl: 98 ug/mL (ref 50.0–100.0)

## 2023-06-05 MED ORDER — BENZTROPINE MESYLATE 0.5 MG PO TABS: 0.5000 mg | ORAL_TABLET | Freq: Two times a day (BID) | ORAL | 30 refills | Status: DC

## 2023-06-05 MED ORDER — FLUPHENAZINE HCL 5 MG PO TABS: 5.0000 mg | ORAL_TABLET | Freq: Two times a day (BID) | ORAL | 0 refills | Status: DC

## 2023-06-05 MED ORDER — NICOTINE POLACRILEX 2 MG MT GUM: 2.0000 mg | CHEWING_GUM | OROMUCOSAL | Status: DC | PRN

## 2023-06-05 MED ORDER — HYDROXYZINE HCL 25 MG PO TABS: 25.0000 mg | ORAL_TABLET | Freq: Three times a day (TID) | ORAL | 0 refills | Status: DC | PRN

## 2023-06-05 MED ORDER — NICOTINE POLACRILEX 2 MG MT GUM: 2.0000 mg | CHEWING_GUM | OROMUCOSAL | 0 refills | Status: DC | PRN

## 2023-06-05 MED ORDER — TRAZODONE HCL 50 MG PO TABS: 50.0000 mg | ORAL_TABLET | Freq: Every evening | ORAL | 0 refills | Status: DC | PRN

## 2023-06-05 MED ORDER — PROPRANOLOL HCL 10 MG PO TABS: 5.0000 mg | ORAL_TABLET | Freq: Two times a day (BID) | ORAL | 0 refills | Status: AC

## 2023-06-05 MED ORDER — NICOTINE 21 MG/24HR TD PT24: 21.0000 mg | MEDICATED_PATCH | Freq: Every day | TRANSDERMAL | Status: DC

## 2023-06-05 MED ORDER — DIVALPROEX SODIUM 500 MG PO DR TAB: 500.0000 mg | DELAYED_RELEASE_TABLET | Freq: Two times a day (BID) | ORAL | 0 refills | Status: DC

## 2023-06-05 NOTE — Progress Notes (Signed)
 Recreation Therapy Notes  INPATIENT RECREATION TR PLAN  Patient Details Name: [name] MRN: [id number] DOB: 09-28-1995 Today's Date: 06/05/2023  Rec Therapy Plan Is patient appropriate for Therapeutic Recreation?: Yes Treatment times per week: about 3 days Estimated Length of Stay: 5-7 days TR Treatment/Interventions: Group participation (Comment)  Discharge Criteria Pt will be discharged from therapy if:: Discharged Treatment plan/goals/alternatives discussed and agreed upon by:: Patient/family  Discharge Summary Short term goals set: See patient care plan Short term goals met: Not met Reason goals not met: Patient did not attend group sessions. Therapeutic equipment acquired: N/A Reason patient discharged from therapy: Discharge from hospital Pt/family agrees with progress & goals achieved: Yes Date patient discharged from therapy: 06/05/23    Eann Cleland-McCall, LRT,CTRS Loveta Dellis A Harris Penton-McCall 06/05/2023, 12:21 PM

## 2023-06-05 NOTE — Progress Notes (Signed)
 Labs appeared to show up while pt was at breakfast

## 2023-06-05 NOTE — Progress Notes (Signed)
  [facility] Case Management Discharge Plan :  Will you be returning to the same living situation after discharge:  Yes,  patient will return to his father's home At discharge, do you have transportation home?: Yes,  Wasyl Dornfeld (father) will pick him up at 12 PM Do you have the ability to pay for your medications: Yes,  patient has insurance  Release of information consent forms completed and in the chart;  Patient's signature needed at discharge.  Patient to Follow up at:  Follow-up Information     [facility] Go on 06/19/2023.   Why: You have an appointment for medication management services on 06/19/23 at 4:00 pm, in person but you may switch to Virtual. Contact information: [address] [place] [address] [phone]         [facility] Go to.   Specialty: Professional Counselor Why: You may go to this provider on 06/06/2023 at 9:00 AM for therapy services.  Walk in hours for new patients are:  Monday thru Friday, from 9 am to 1 pm. Contact information: [address] [place]  [address] [place] [address] [phone]                 Next level of care provider has access to [facility] Link:no  Safety Planning and Suicide Prevention discussed: Yes,  Kvon Mcilhenny (father) [phone]     Has patient been referred to the Quitline?: Patient does not use tobacco/nicotine  products  Patient has been referred for addiction treatment: Patient refused referral for treatment.  Temitope Griffing O Ronnica Dreese, LCSWA 06/05/2023, 10:08 AM

## 2023-06-05 NOTE — Group Note (Signed)
 Recreation Therapy Group Note   Group Topic:Communication  Group Date: 06/05/2023 Start Time: 1000 End Time: 1020 Facilitators: Trejon Duford-McCall, LRT,CTRS Location: [address] Dayroom  Group Topic: Communication, Problem Solving   Goal Area(s) Addresses:  Patient will effectively listen to complete activity.  Patient will identify communication skills used to make activity successful.  Patient will identify how skills used during activity can be used to reach post d/c goals.    Intervention: Building Surveyor Activity - Geometric pattern cards, pencils, blank paper    Activity: Geometric Drawings.  Three volunteers from the peer group will be shown an abstract picture with a particular arrangement of geometrical shapes.  Each round, one 'speaker' will describe the pattern, as accurately as possible without revealing the image to the group.  The remaining group members will listen and draw the picture to reflect how it is described to them. Patients with the role of 'listener' cannot ask clarifying questions but, may request that the speaker repeat a direction. Once the drawings are complete, the presenter will show the rest of the group the picture and compare how close each person came to drawing the picture. LRT will facilitate a post-activity discussion regarding effective communication and the importance of planning, listening, and asking for clarification in daily interactions with others.  Education: Environmental consultant, Active listening, Support systems, Discharge planning  Education Outcome: Acknowledges understanding/In group clarification offered/Needs additional education.   Affect/Mood: N/A   Participation Level: Did not attend    Clinical Observations/Individualized Feedback:      Plan: Continue to engage patient in RT group sessions 2-3x/week.   Yona Stansbury-McCall, LRT,CTRS 06/05/2023 11:34 AM

## 2023-06-05 NOTE — [facility] Suicide Risk Assessment (Signed)
 Suicide Risk Assessment  Discharge Assessment    [facility] Discharge Suicide Risk Assessment   Principal Problem: Disorganized schizophrenia (HCC)  Discharge Diagnoses: Principal Problem:   Disorganized schizophrenia (HCC)  Total Time spent with patient:  Greater than 30 minutes  Musculoskeletal: Strength & Muscle Tone: within normal limits Gait & Station: normal Patient leans: N/A  Psychiatric Specialty Exam  Presentation  General Appearance:  Appropriate for Environment; Casual; Fairly Groomed  Eye Contact: Good  Speech: Clear and Coherent; Normal Rate  Speech Volume: Normal  Handedness: Right   Mood and Affect  Mood: Euthymic  Duration of Depression Symptoms: Greater than two weeks  Affect: Appropriate; Congruent   Thought Process  Thought Processes: Coherent  Descriptions of Associations:Intact  Orientation:Full (Time, Place and Person)  Thought Content:Logical  History of Schizophrenia/Schizoaffective disorder:Yes  Duration of Psychotic Symptoms:Greater than six months  Hallucinations:Hallucinations: None  Ideas of Reference:None  Suicidal Thoughts:Suicidal Thoughts: No  Homicidal Thoughts:Homicidal Thoughts: No   Sensorium  Memory: Recent Good; Immediate Good; Remote Good  Judgment: Good  Insight: Fair   Executive Functions  Concentration: Good  Attention Span: Good  Recall: Good  Fund of Knowledge: Fair  Language: Good   Psychomotor Activity  Psychomotor Activity: Psychomotor Activity: Normal   Assets  Assets: Communication Skills; Desire for Improvement; Housing; Physical Health; Resilience; Social Support   Sleep  Sleep: Sleep: Good Number of Hours of Sleep: 8   Physical Exam: See the discharge summary.  Blood pressure 93/65, pulse 75, temperature (!) 97.5 F (36.4 C), temperature source Oral, resp. rate 16, height 5' 5 (1.651 m), weight 58.5 kg, SpO2 100%. Body mass index is 21.47  kg/m.  Mental Status Per Nursing Assessment::   On Admission:  NA  Demographic Factors:  Male, Adolescent or young adult, Low socioeconomic status, and Unemployed  Loss Factors: Decrease in vocational status  Historical Factors: Impulsivity  Risk Reduction Factors:   Sense of responsibility to family, Living with another person, especially a relative, Positive social support, Positive therapeutic relationship, and Positive coping skills or problem solving skills  Continued Clinical Symptoms:  Alcohol/Substance Abuse/Dependencies  Cognitive Features That Contribute To Risk:  Closed-mindedness, Polarized thinking, and Thought constriction (tunnel vision)    Suicide Risk:  Patient currently/adamantly denies nay SIHI, plans or intent.   Follow-up Information     [facility] Go on 06/19/2023.   Why: You have an appointment for medication management services on 06/19/23 at 4:00 pm, in person but you may switch to Virtual. Contact information: [address] [place] [address] [phone]         [place] Go to.   Specialty: Professional Counselor Why: You may go to this provider on 06/06/2023 at 9:00 AM for therapy services.  Walk in hours for new patients are:  Monday thru Friday, from 9 am to 1 pm. Contact information: [address] [place]  [address] [place] [address] [phone]                Plan Of Care/Follow-up recommendations:  See the discharge recommendation ablve.  Mac Bolster, NP, pmhnp, Fnp-bc. 06/05/2023, 9:41 AM

## 2023-06-05 NOTE — Plan of Care (Signed)
Patient did not attend recreation therapy group sessions.   Brendan Hogan, LRT,CTRS 

## 2023-06-05 NOTE — Discharge Summary (Signed)
 Physician Discharge Summary Note  Patient:  Brendan Hogan is an 28 y.o., male MRN:  [id number] DOB:  11-Jan-1996 Patient phone:  [phone] (home)  Patient address:   [address] [place] [address],  Total Time spent with patient:  Greater than 30 minutes.  Date of Admission:  05/27/2023  Date of Discharge: 06-05-23  Reason for Admission: Decreased oral intake, increased self-talking & escalating aggression.   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  Past Medical History:  Past Medical History:  Diagnosis Date   Psychosis (HCC) 05/2019   [facility]    Seasonal allergies     Past Surgical History:  Procedure Laterality Date   TONSILLECTOMY     Family History:  Family History  Problem Relation Age of Onset   Hypertension Mother    Family Psychiatric  History: See H&P.  Social History:  Social History   Substance and Sexual Activity  Alcohol Use Not Currently   Comment: rare     Social History   Substance and Sexual Activity  Drug Use No    Social History   Socioeconomic History   Marital status: Single    Spouse name: Not on file   Number of children: Not on file   Years of education: Not on file   Highest education level: Not on file  Occupational History   Not on file  Tobacco Use   Smoking status: Every Day    Current packs/day: 1.00    Types: Cigarettes   Smokeless tobacco: Former  Building Services Engineer status: Never Used  Substance and Sexual Activity   Alcohol use: Not Currently    Comment: rare   Drug use: No   Sexual activity: Not Currently  Other Topics Concern   Not on file  Social History Narrative   Not on file   Social Drivers of Health   Financial Resource Strain: Not on file  Food Insecurity: Patient Declined (05/27/2023)   Hunger Vital Sign    Worried About  Running Out of Food in the Last Year: Patient declined    Ran Out of Food in the Last Year: Patient declined  Transportation Needs: Patient Declined (05/27/2023)   PRAPARE - Administrator, Civil Service (Medical): Patient declined    Lack of Transportation (Non-Medical): Patient declined  Physical Activity: Not on file  Stress: Not on file  Social Connections: Not on file   Hospital Course: (Per admission evaluation notes):  28 year old male, with a psychiatric history significant for polysubstance abuse, psychosis, schizophrenia, and multiple psychiatric hospitalizations. According to family reports, over the past week, he has exhibited decreased oral intake, increased self-talking, and escalating aggression. It has been more than a week since he last took his psychotropic medications. The family further reported that he became combative at home, prompting them to contact law enforcement. Upon police arrival, the patient continued to display combative behavior, leading to his transport to [name] ED on January 22 under involuntary commitment (IVC).  In the ED, he reportedly remained combative, necessitating the use of restraints.   This is one of several psychiatric admission/discharge summaries from this [facility] for this 27 year old AA male with hx of chronic mental illness, substance use disorder & multiple psychiatric admissions. He is known in this [facility] for worsening symptoms of Schizophrenia. Brendan Hogan has been tried on multiple psychotropic medications  for his symptoms & it appeared his symptoms has not been able to improve on as of yet & yet, patient is known to be non-compliant to his recommended treatment regimen pre-disposing him to relapses/recurrent of symptoms & frequent hospitalizations. He was brought to the [facility] this time around for evaluation & treatment for decreased oral intake, increased self-talking, and escalating aggression.   Upon his arrival to [facility] & after evaluation of  his presenting symptoms, Brendan Hogan was recommended for mood stabilization treatments. The medication regimen for his presenting symptoms were discussed & with his consent initiated. Initiated, he was refusing to take his ordered medication leading to an order for forced medication. If at anytime that patient refused to take his oral medications, he will given the injectable form. This went on few days until patient got tired of being forced to receive injectable & started taking his medications orally. Brendan Hogan was medicated, stabilized & discharged on the medications as listed below on his discharge medication lists. He was also enrolled & participated in the group counseling sessions being offered & held on this unit. He learned coping skills. He presented on this admission, no other chronic medical conditions that required treatment & monitoring. He tolerated his treatment regimen without any adverse effects or reactions reported.  Patient's symptoms responded well to his treatment regimen warranting this discharge. He is currently mentally & medically stable to be discharged to continue mental health & medication management on an outpatient basis as noted below. He is agreaable to this discharged. During the course of his hospitalization, the 15-minute checks were adequate to ensure his safety.  Patient did not display any dangerous, violent or suicidal behaviors on the unit. He interacted with patients & staff appropriately. He participated appropriately in the group sessions/therapies. His medications were addressed & adjusted to meet his needs. He was recommended for outpatient follow-up care & medication management upon discharge to assure his continuity of care.  At the time of this discharge, patient is not reporting any acute suicidal/homicidal ideations. He currently denies any new issues or concerns. Education and supportive counseling provided throughout her hospital stay & upon discharge.  Today upon  his discharge evaluation with his treatment team, patient presents stable. He denies any other specific concerns. He is sleeping well. His appetite is good. He denies other physical complaints. He denies AH/VH, delusional thoughts or paranoia. He feels that his medications have been helpful & is in agreement to continue his current treatment regimen as recommended. He was able to engage in safety planning including plan to return to [facility] or contact emergency services if he feels unable to maintain his own safety or the safety of others. Pt had no further questions, comments, or concerns. He left [facility] with all personal belongings in no apparent distress. Transportation per his family (father).  Physical Findings: AIMS:  , ,  ,  ,    CIWA:    COWS:     Musculoskeletal: Strength & Muscle Tone: within normal limits Gait & Station: normal Patient leans: N/A   Psychiatric Specialty Exam:  Presentation  General Appearance:  Appropriate for Environment; Casual; Fairly Groomed  Eye Contact: Good  Speech: Clear and Coherent; Normal Rate  Speech Volume: Normal  Handedness: Right   Mood and Affect  Mood: Euthymic  Affect: Appropriate; Congruent   Thought Process  Thought Processes: Coherent  Descriptions of Associations:Intact  Orientation:Full (Time, Place and Person)  Thought Content:Logical  History of Schizophrenia/Schizoaffective disorder:Yes  Duration of Psychotic Symptoms:Greater than six  months  Hallucinations:Hallucinations: None  Ideas of Reference:None  Suicidal Thoughts:Suicidal Thoughts: No  Homicidal Thoughts:Homicidal Thoughts: No   Sensorium  Memory: Recent Good; Immediate Good; Remote Good  Judgment: Good  Insight: Fair   Executive Functions  Concentration: Good  Attention Span: Good  Recall: Good  Fund of Knowledge: Fair  Language: Good   Psychomotor Activity  Psychomotor Activity: Psychomotor Activity: Normal  Assets   Assets: Communication Skills; Desire for Improvement; Housing; Physical Health; Resilience; Social Support  Sleep  Sleep: Sleep: Good Number of Hours of Sleep: 8  Physical Exam: Physical Exam Vitals and nursing note reviewed.  Cardiovascular:     Rate and Rhythm: Normal rate.     Pulses: Normal pulses.  Pulmonary:     Effort: Pulmonary effort is normal.  Genitourinary:    Comments: Deferred Musculoskeletal:        General: Normal range of motion.     Cervical back: Normal range of motion.  Skin:    General: Skin is warm and dry.  Neurological:     General: No focal deficit present.     Mental Status: He is alert and oriented to person, place, and time. Mental status is at baseline.    Review of Systems  Constitutional:  Negative for chills, diaphoresis and fever.  HENT:  Negative for congestion and sore throat.   Respiratory:  Negative for cough, shortness of breath and wheezing.   Cardiovascular:  Negative for chest pain and palpitations.  Gastrointestinal:  Negative for abdominal pain, constipation, diarrhea, heartburn, nausea and vomiting.  Musculoskeletal:  Negative for joint pain and myalgias.  Neurological:  Negative for dizziness, tingling, tremors, sensory change, speech change, focal weakness, seizures, loss of consciousness, weakness and headaches.  Endo/Heme/Allergies:        Allergies: Haldol , fluphenazine , Geodon .  Psychiatric/Behavioral:  Positive for substance abuse (Hx substance use disorder.). Negative for depression, hallucinations, memory loss and suicidal ideas. The patient has insomnia (Hx. of (stable on medication).). The patient is not nervous/anxious (Stable upon discharge.).    Blood pressure 93/65, pulse 75, temperature (!) 97.5 F (36.4 C), temperature source Oral, resp. rate 16, height 5' 5 (1.651 m), weight 58.5 kg, SpO2 100%. Body mass index is 21.47 kg/m.   Social History   Tobacco Use  Smoking Status Every Day   Current packs/day:  1.00   Types: Cigarettes  Smokeless Tobacco Former   Tobacco Cessation:  A prescription for an FDA-approved tobacco cessation medication provided at discharge  Blood Alcohol level:  Lab Results  Component Value Date   [facility] <10 05/21/2023   ETH <10 08/18/2022   Metabolic Disorder Labs:  Lab Results  Component Value Date   HGBA1C 5.3 12/09/2022   MPG 105 12/09/2022   MPG 108 05/02/2022   Lab Results  Component Value Date   PROLACTIN 26.5 (H) 09/08/2019   Lab Results  Component Value Date   CHOL 150 12/09/2022   TRIG 95 12/09/2022   HDL 40 (L) 12/09/2022   CHOLHDL 3.8 12/09/2022   VLDL 19 12/09/2022   LDLCALC 91 12/09/2022   LDLCALC 96 05/02/2022    See Psychiatric Specialty Exam and Suicide Risk Assessment completed by Attending Physician prior to discharge.  Discharge destination:  Home  Is patient on multiple antipsychotic therapies at discharge:  No   Has Patient had three or more failed trials of antipsychotic monotherapy by history:  No  Recommended Plan for Multiple Antipsychotic Therapies: NA  Allergies as of 06/05/2023  Reactions   Haldol  [haloperidol ] Other (See Comments)   Muscle spasms- involuntary muscle contractions   Fluphenazine  Other (See Comments)   Over 10 mg a day causes drooling   Geodon  [ziprasidone ] Other (See Comments)   Patient reports that this medicine made him feel badly. He has difficulty describing symptoms. He requests not to take anymore.         Medication List     TAKE these medications      Indication  benztropine  0.5 MG tablet Commonly known as: COGENTIN  Take 1 tablet (0.5 mg total) by mouth 2 (two) times daily. For prevention of drug induced tremors.  Indication: Extrapyramidal Reaction caused by Medications   divalproex  500 MG DR tablet Commonly known as: DEPAKOTE  Take 1 tablet (500 mg total) by mouth every 12 (twelve) hours. For mood stabilization.  Indication: Mood stabilization   fluPHENAZine  5 MG  tablet Commonly known as: PROLIXIN  Take 1 tablet (5 mg total) by mouth every 12 (twelve) hours. For mood control  Indication: Mood control   hydrOXYzine  25 MG tablet Commonly known as: ATARAX  Take 1 tablet (25 mg total) by mouth 3 (three) times daily as needed for anxiety.  Indication: Feeling Anxious   nicotine  21 mg/24hr patch Commonly known as: NICODERM CQ  - dosed in mg/24 hours Place 1 patch (21 mg total) onto the skin daily. (May buy from over the counter): Foe smoking cessation. Start taking on: June 06, 2023  Indication: Nicotine  Addiction   nicotine  polacrilex 2 MG gum Commonly known as: NICORETTE  Take 1 each (2 mg total) by mouth as needed. (May buy from over the counter: For smoking cessation.  Indication: Nicotine  Addiction   propranolol  10 MG tablet Commonly known as: INDERAL  Take 0.5 tablets (5 mg total) by mouth every 12 (twelve) hours. For anxiety  Indication: Feeling Anxious   traZODone  50 MG tablet Commonly known as: DESYREL  Take 1 tablet (50 mg total) by mouth at bedtime as needed for sleep.  Indication: Trouble Sleeping        Follow-up Information     [facility] Go on 06/19/2023.   Why: You have an appointment for medication management services on 06/19/23 at 4:00 pm, in person but you may switch to Virtual. Contact information: [address] [place] [address] [phone]         [place] Go to.   Specialty: Professional Counselor Why: You may go to this provider on 06/06/2023 at 9:00 AM for therapy services.  Walk in hours for new patients are:  Monday thru Friday, from 9 am to 1 pm. Contact information: [address] [place]  [address] [place] [address] [phone]                Follow-up recommendations: Activity:  As tolerated Diet: As recommended by your primary care doctor. Keep all scheduled follow-up appointments as recommended.   Comments: Comments: Patient is recommended  to follow-up care on an outpatient basis as noted above. Prescriptions sent to pt's pharmacy of choice at discharge.   Patient agreeable to plan.   Given opportunity to ask questions.   Appears to feel comfortable with discharge denies any current suicidal or homicidal thought. Patient is also instructed prior to discharge to: Take all medications as prescribed by his/her mental healthcare provider. Report any adverse effects and or reactions from the medicines to his/her outpatient provider promptly. Patient has been instructed & cautioned: To not engage in alcohol and or illegal drug use while  on prescription medicines. In the event of worsening symptoms, patient is instructed to call the crisis hotline, 911 and or go to the nearest ED for appropriate evaluation and treatment of symptoms. To follow-up with his/her primary care provider for your other medical issues, concerns and or health care needs.  Signed: Mac Bolster, NP, pmhnp, fnp-bc. 06/05/2023, 9:42 AM

## 2023-06-05 NOTE — Progress Notes (Signed)
 D: Pt A & O X 3. Denies SI, HI, AVH and pain at this time. D/C home as ordered. Picked up in lobby by his father. A: D/C instructions reviewed with pt including prescriptions, medication samples and follow up appointment, compliance encouraged. All belongings from assigned locker returned to pt at time of departure. Scheduled medications given with verbal education and effects monitored. Safety checks maintained without incident till time of d/c.  R: Pt receptive to care. Compliant with medications when offered. Denies adverse drug reactions when assessed. Verbalized understanding related to d/c instructions. Signed belonging sheet in agreement with items received from locker. Ambulatory with a steady gait. Appears to be in no physical distress at time of departure.

## 2023-06-09 ENCOUNTER — Encounter (HOSPITAL_COMMUNITY): Payer: Self-pay

## 2023-06-09 ENCOUNTER — Other Ambulatory Visit: Payer: Self-pay

## 2023-06-09 ENCOUNTER — Emergency Department (HOSPITAL_COMMUNITY)
Admission: EM | Admit: 2023-06-09 | Discharge: 2023-06-11 | Disposition: A | Discharge status: Other Acute Inpatient Hospital | Payer: PRIVATE HEALTH INSURANCE | Attending: Emergency Medicine | Admitting: Emergency Medicine

## 2023-06-09 DIAGNOSIS — F201 Disorganized schizophrenia: Secondary | ICD-10-CM | POA: Diagnosis not present

## 2023-06-09 DIAGNOSIS — R451 Restlessness and agitation: Secondary | ICD-10-CM | POA: Diagnosis not present

## 2023-06-09 DIAGNOSIS — F29 Unspecified psychosis not due to a substance or known physiological condition: Secondary | ICD-10-CM | POA: Diagnosis not present

## 2023-06-09 DIAGNOSIS — F209 Schizophrenia, unspecified: Secondary | ICD-10-CM | POA: Insufficient documentation

## 2023-06-09 DIAGNOSIS — Z91148 Patient's other noncompliance with medication regimen for other reason: Secondary | ICD-10-CM

## 2023-06-09 DIAGNOSIS — Z1152 Encounter for screening for COVID-19: Secondary | ICD-10-CM | POA: Diagnosis not present

## 2023-06-09 DIAGNOSIS — F23 Brief psychotic disorder: Secondary | ICD-10-CM

## 2023-06-09 HISTORY — DX: Primary insomnia: F51.01

## 2023-06-09 HISTORY — DX: Disorganized schizophrenia: F20.1

## 2023-06-09 LAB — CBC WITH DIFFERENTIAL/PLATELET
Abs Immature Granulocytes: 0.02 10*3/uL (ref 0.00–0.07)
Basophils Absolute: 0.1 10*3/uL (ref 0.0–0.1)
Basophils Relative: 1 %
Eosinophils Absolute: 0.1 10*3/uL (ref 0.0–0.5)
Eosinophils Relative: 1 %
HCT: 43.8 % (ref 39.0–52.0)
Hemoglobin: 15 g/dL (ref 13.0–17.0)
Immature Granulocytes: 0 %
Lymphocytes Relative: 26 %
Lymphs Abs: 1.9 10*3/uL (ref 0.7–4.0)
MCH: 29.4 pg (ref 26.0–34.0)
MCHC: 34.2 g/dL (ref 30.0–36.0)
MCV: 85.7 fL (ref 80.0–100.0)
Monocytes Absolute: 0.9 10*3/uL (ref 0.1–1.0)
Monocytes Relative: 12 %
Neutro Abs: 4.2 10*3/uL (ref 1.7–7.7)
Neutrophils Relative %: 60 %
Platelets: 159 10*3/uL (ref 150–400)
RBC: 5.11 MIL/uL (ref 4.22–5.81)
RDW: 12.6 % (ref 11.5–15.5)
WBC: 7.2 10*3/uL (ref 4.0–10.5)
nRBC: 0 % (ref 0.0–0.2)

## 2023-06-09 LAB — RAPID URINE DRUG SCREEN, HOSP PERFORMED
Amphetamines: NOT DETECTED
Barbiturates: NOT DETECTED
Benzodiazepines: NOT DETECTED
Cocaine: NOT DETECTED
Opiates: NOT DETECTED
Tetrahydrocannabinol: POSITIVE — AB

## 2023-06-09 LAB — COMPREHENSIVE METABOLIC PANEL
ALT: 17 U/L (ref 0–44)
AST: 22 U/L (ref 15–41)
Albumin: 4.3 g/dL (ref 3.5–5.0)
Alkaline Phosphatase: 80 U/L (ref 38–126)
Anion gap: 14 (ref 5–15)
BUN: 9 mg/dL (ref 6–20)
CO2: 22 mmol/L (ref 22–32)
Calcium: 9.5 mg/dL (ref 8.9–10.3)
Chloride: 106 mmol/L (ref 98–111)
Creatinine, Ser: 0.77 mg/dL (ref 0.61–1.24)
GFR, Estimated: >60 mL/min (ref >60)
Glucose, Bld: 94 mg/dL (ref 70–99)
Potassium: 3.5 mmol/L (ref 3.5–5.1)
Sodium: 142 mmol/L (ref 135–145)
Total Bilirubin: 0.7 mg/dL (ref 0.0–1.2)
Total Protein: 6.9 g/dL (ref 6.5–8.1)

## 2023-06-09 LAB — SALICYLATE LEVEL: Salicylate Lvl: <7 mg/dL — ABNORMAL LOW (ref 7.0–30.0)

## 2023-06-09 LAB — CBG MONITORING, ED: Glucose-Capillary: 83 mg/dL (ref 70–99)

## 2023-06-09 LAB — ETHANOL: Alcohol, Ethyl (B): <10 mg/dL (ref <10)

## 2023-06-09 LAB — ACETAMINOPHEN LEVEL: Acetaminophen (Tylenol), Serum: <10 ug/mL — ABNORMAL LOW (ref 10–30)

## 2023-06-09 MED ORDER — STERILE WATER FOR INJECTION IJ SOLN
INTRAMUSCULAR | Status: AC
Start: 2023-06-09 — End: 2023-06-09
  Administered 2023-06-09: 10 mL
  Filled 2023-06-09: qty 10

## 2023-06-09 MED ORDER — ZIPRASIDONE MESYLATE 20 MG IM SOLR
20.0000 mg | Freq: Once | INTRAMUSCULAR | Status: AC
  Administered 2023-06-09: 20 mg via INTRAMUSCULAR
  Filled 2023-06-09: qty 20

## 2023-06-09 MED ORDER — LORAZEPAM 2 MG/ML IJ SOLN
1.0000 mg | Freq: Once | INTRAMUSCULAR | Status: AC
  Administered 2023-06-09: 1 mg via INTRAVENOUS
  Filled 2023-06-09: qty 1

## 2023-06-09 MED ORDER — HYDROXYZINE HCL 25 MG PO TABS: 25.0000 mg | ORAL_TABLET | Freq: Three times a day (TID) | ORAL | Status: DC

## 2023-06-09 MED ORDER — PROPRANOLOL HCL 10 MG PO TABS
5.0000 mg | ORAL_TABLET | Freq: Two times a day (BID) | ORAL | Status: DC
  Administered 2023-06-09 – 2023-06-10 (×3): 5 mg via ORAL
  Filled 2023-06-09 (×5): qty 0.5

## 2023-06-09 MED ORDER — TRAZODONE HCL 50 MG PO TABS
50.0000 mg | ORAL_TABLET | Freq: Every evening | ORAL | Status: DC | PRN
  Administered 2023-06-10: 50 mg via ORAL
  Filled 2023-06-09: qty 1

## 2023-06-09 MED ORDER — HYDROXYZINE HCL 25 MG PO TABS: 25.0000 mg | ORAL_TABLET | Freq: Three times a day (TID) | ORAL | Status: DC | PRN

## 2023-06-09 MED ORDER — FLUPHENAZINE HCL 5 MG PO TABS
5.0000 mg | ORAL_TABLET | Freq: Two times a day (BID) | ORAL | Status: DC
  Administered 2023-06-09 – 2023-06-10 (×4): 5 mg via ORAL
  Filled 2023-06-09 (×5): qty 1

## 2023-06-09 MED ORDER — BENZTROPINE MESYLATE 1 MG PO TABS
0.5000 mg | ORAL_TABLET | Freq: Two times a day (BID) | ORAL | Status: DC
  Administered 2023-06-09 – 2023-06-10 (×4): 0.5 mg via ORAL
  Filled 2023-06-09 (×5): qty 1

## 2023-06-09 MED ORDER — DIVALPROEX SODIUM 500 MG PO DR TAB
500.0000 mg | DELAYED_RELEASE_TABLET | Freq: Two times a day (BID) | ORAL | Status: DC
  Administered 2023-06-09 – 2023-06-10 (×4): 500 mg via ORAL
  Filled 2023-06-09 (×4): qty 1

## 2023-06-09 NOTE — ED Notes (Signed)
Patient is resting comfortably.

## 2023-06-09 NOTE — BH Assessment (Signed)
Disposition Note:   @ 8:23 PM, Pete, the intake coordinator at [facility], confirmed that the patient has been accepted for admission on 06/10/2023. The accepting doctor is Loura Halt. The nurse report contact number is [phone]. The patient will be admitted to [facility].  Hoy Finlay, RN, provided disposition updates.

## 2023-06-09 NOTE — ED Provider Notes (Signed)
[name] [facility]GENCY DE[facility]MENT AT [facility] Provider Note   CSN: [id number] Arrival date & time: 06/09/23  1104     History  Chief Complaint  Patient presents with   Altered Mental Status    Brendan Hogan is a 28 y.o. male.  Patient with history of disorganized schizophrenia, acute psychosis presents today with concerns for acute psychosis. Patient speaking nonsensically, therefore history provided by family at bedside. Patient was just admitted to behavioral health and discharged on 2/06. Parents endorse that at home he refuses to take his medications and therefore he ends up in the same condition that got him admitted to behavioral health in the first place.  They report that behavioral health has to force him to take his medication and has to give him IM doses given his refusal.  They do not feel like behavioral health is giving him any benefit as he immediately stops his medications after he leaves.  They are requesting long-term care.  They state that this morning around 9 AM the patient suddenly left the house and started walking down the street.  Mom reportedly was following him in her car and called EMS to have him transported here given concern for his safety.  She states that he is behaving as he usually does when he is off his medications.  She has not seen him use any substances.  The history is provided by the patient and a parent. No language interpreter was used.  Altered Mental Status Associated symptoms: agitation        Home Medications Prior to Admission medications   Not on File      Allergies    Patient has no known allergies.    Review of Systems   Review of Systems  Psychiatric/Behavioral:  Positive for agitation and dysphoric mood. The patient is hyperactive.   All other systems reviewed and are negative.   Physical Exam Updated Vital Signs BP 106/66   Pulse 85   Temp 97.6 F (36.4 C) (Temporal)   Resp 16   Wt 74.8 kg   SpO2 97%   Physical Exam Vitals and nursing note reviewed.  Constitutional:      General: He is not in acute distress.    Appearance: Normal appearance. He is normal weight. He is not ill-appearing, toxic-appearing or diaphoretic.  HENT:     Head: Normocephalic and atraumatic.  Cardiovascular:     Rate and Rhythm: Normal rate.  Pulmonary:     Effort: Pulmonary effort is normal. No respiratory distress.  Musculoskeletal:        General: Normal range of motion.     Cervical back: Normal range of motion.  Skin:    General: Skin is warm and dry.  Neurological:     General: No focal deficit present.     Mental Status: He is alert.  Psychiatric:        Mood and Affect: Mood normal.        Behavior: Behavior normal.     Comments: Patient speaking and yelling nonsensically, clearly agitated     ED Results / Procedures / Treatments   Labs (all labs ordered are listed, but only abnormal results are displayed) Labs Reviewed  SALICYLATE LEVEL - Abnormal; Notable for the following components:      Result Value   Salicylate Lvl <7.0 (*)    All other components within normal limits  ACETAMINOPHEN LEVEL - Abnormal; Notable for the following components:   Acetaminophen (Tylenol), Serum <10 (*)  All other components within normal limits  COMPREHENSIVE METABOLIC PANEL  ETHANOL  CBC WITH DIFFERENTIAL/PLATELET  RAPID URINE DRUG SCREEN, HOSP PERFORMED  CBG MONITORING, ED    EKG EKG Interpretation Date/Time:  Monday June 09 2023 11:33:26 EST Ventricular Rate:  76 PR Interval:  119 QRS Duration:  88 QT Interval:  356 QTC Calculation: 401 R Axis:   21  Text Interpretation: Sinus rhythm Ventricular premature complex Borderline short PR interval Confirmed by Elayne Snare ([id number]) on 06/09/2023 12:31:19 PM  Radiology No results found.  Procedures Procedures    Medications Ordered in ED Medications  LORazepam (ATIVAN) injection 1 mg (1 mg Intravenous Given 06/09/23 1122)  LORazepam  (ATIVAN) injection 1 mg (1 mg Intravenous Given 06/09/23 1131)  ziprasidone (GEODON) injection 20 mg (20 mg Intramuscular Given 06/09/23 1156)  sterile water (preservative free) injection (10 mLs  Given 06/09/23 1156)    ED Course/ Medical Decision Making/ A&P                                 Medical Decision Making Amount and/or Complexity of Data Reviewed Labs: ordered.  Risk Prescription drug management.   Patient is a 28 y.o. male  who presents to the emergency department for psychiatric complaint.  Past Medical History: Patient with history of disorganized schizophrenia and acute psychosis  Physical Exam: Patient with disorganized nonsensical speech, agitation, refusing to let staff get vital signs  Additional history: Chart reviewed. Pertinent results include: patient recently hospitalized from 05/21/2023 to 06/05/2023 for disorganized schizophrenia and acute psychosis.  It appears that during this stay he required IM medications due to refusing oral medications until several days into his stay when he finally started taking oral medications.  He has been IVC had several times in the past year and has been admitted for similar complaints.  Labs: Medical clearance labs ordered, with following pertinent results: no acute laboratory abnormalities  Cardiac monitoring: EKG obtained and interpreted by attending physician which shows: sinus rhythm  Medications: I ordered medication including ativan, Geodon  for acute psychosis, agitation. I have reviewed the patients home medicines and have made adjustments as needed.  Disposition: Patient is medically cleared at this time. Will consult TTS and appreciate their recommendations.  I discussed this case with my attending physician Dr. Theresia Lo who cosigned this note including patient's presenting symptoms, physical exam, and planned diagnostics and interventions. Attending physician stated agreement with plan or made changes to plan  which were implemented.   Final Clinical Impression(s) / ED Diagnoses Final diagnoses:  Acute psychosis [facility])    Rx / DC Orders ED Discharge Orders     None         Silva Bandy, PA-C 06/09/23 1335    Elayne Snare K, DO 06/09/23 1533

## 2023-06-09 NOTE — BH Assessment (Signed)
Disposition Note:  Per Eligha Bridegroom, NP, the patient meets the criteria for inpatient psychiatric treatment. Per [facility] AC Rosey Bath, RN), no appropriate [facility] beds are available at this time. Dsposition counselor was advised to seek alternative hospital placement. The patient was referred to the following hospitals for consideration of bed placement.    Destination  Service Provider Request Status Services Address Phone Fax Patient Preferred  [facility] Pending - Request Sent -- [address] [place] [address] [phone] --  [facility] Pending - Request Rhina Brackett [place] [address] [phone] --  [facility] Pending - Request Sent -- [address] [place] [address] [phone] --  [facility] Pending - Request Sent -- [address] [place] [address] [phone] --  [facility] Pending - Request Sent -- [address] [place] [address] [phone] --  [facility] Pending - Request Sent -- [address] [place], [place] [address] [phone] --  [facility] Pending - Request Sent -- [id number] [place] [address] [phone] --  [facility] Pending - Request Sent -- [address] [place], [place] [address] [phone] --  [facility] Pending - Request Sent -- [address], [place] [address] [phone] --  [facility] Pending - Request Sent -- [address] [place] [address] [phone] --  [facility] Pending - Request Sent -- [address], [place] [address] [phone] --  [facility] Pending - Request Sent -- [address] [place] [address] [phone] --  [facility] Pending -  Request Sent -- [address], [place] [address] [phone] --  [facility] Pending - Request Sent -- [address], [place] [address] [phone] --  [facility] Pending - Request Sent -- [address] [place] [address] [phone] --  [facility] Pending - Request Sent -- [address] [name] [place] [address] [phone] --  [facility] Pending - Request Sent -- [address] [name] [place] [phone] --  [facility] Pending - Request Sent -- [address], [place] [address] [phone] --  [facility] Pending - Request Sent -- [address], [place] [address] [phone] --  [facility] Pending - Request Sent -- [address] [place] [address] [phone] --  [facility] Pending - Request Sent -- [address] [place] [address] [phone] --  [facility] Pending - Request Sent -- [address] [place], [place] [address] [phone] --  [facility] Pending - Request Sent -- [address] [place] [address] [phone] --  [facility] Pending - Request Sent -- [address], [place] [address] [phone] --  [facility] Pending - No Request Sent -- [place] [phone] -- --

## 2023-06-09 NOTE — ED Notes (Signed)
All patient belongings with mother and father, none in locker
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
## 2023-06-09 NOTE — ED Notes (Signed)
Pt ambulated to bathroom w/ urine cup in hand. Pt nodded in agreement to give a urine sample.

## 2023-06-09 NOTE — ED Notes (Signed)
Pt asking for water. Sitter getting pt water.
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
## 2023-06-10 LAB — RESP PANEL BY RT-PCR (RSV, FLU A&B, COVID)  RVPGX2
Influenza A by PCR: NEGATIVE
Influenza B by PCR: NEGATIVE
Resp Syncytial Virus by PCR: NEGATIVE
SARS Coronavirus 2 by RT PCR: NEGATIVE

## 2023-06-10 NOTE — ED Notes (Signed)
Per [facility], pt will have to be IVC'd or legal guardian will have to sign pt in. Notified EDP, BH NP, BH CSW.

## 2023-06-10 NOTE — ED Provider Notes (Signed)
Emergency Medicine Observation Re-evaluation Note  Brendan Hogan is a 28 y.o. male, seen on rounds today.  Pt initially presented to the ED for complaints of Altered Mental Status Currently, the patient is asleep.  Physical Exam  BP 107/63 (BP Location: Right Arm)   Pulse 75   Temp 97.6 F (36.4 C) (Oral)   Resp 18   Wt 74.8 kg   SpO2 100%  Physical Exam General: asleep Cardiac: asleep Lungs: asleep Psych: asleep  ED Course / MDM  EKG:EKG Interpretation Date/Time:  Monday June 09 2023 11:33:26 EST Ventricular Rate:  76 PR Interval:  119 QRS Duration:  88 QT Interval:  356 QTC Calculation: 401 R Axis:   21  Text Interpretation: Sinus rhythm Ventricular premature complex Borderline short PR interval Confirmed by Elayne Snare ([id number]) on 06/09/2023 12:31:19 PM  I have reviewed the labs performed to date as well as medications administered while in observation.  Recent changes in the last 24 hours include home medications being restarted.  Plan  Current plan is for [name] admission today.    Pricilla Loveless, MD 06/10/23 [phone]

## 2023-06-10 NOTE — ED Notes (Signed)
Called [facility] and left message on secure pager line for call back for report.

## 2023-06-10 NOTE — ED Notes (Signed)
Pt c/o sore throat and cough. Notified EDP

## 2023-06-10 NOTE — ED Provider Notes (Signed)
Patient will need to be IVC for transfer.  He is currently experiencing decompensated schizophrenia and IVC paperwork has been filled out.   Pricilla Loveless, MD 06/10/23 1027

## 2023-06-11 ENCOUNTER — Encounter (HOSPITAL_COMMUNITY): Payer: Self-pay | Admitting: Nurse Practitioner

## 2023-06-11 NOTE — ED Provider Notes (Signed)
Emergency Medicine Observation Re-evaluation Note  Brendan Hogan is a 28 y.o. male, seen on rounds today.  Pt initially presented to the ED for complaints of Altered Mental Status Currently, the patient is taking a shower.  Physical Exam  BP 101/69   Pulse 71   Temp 97.6 F (36.4 C)   Resp 18   Wt 74.8 kg   SpO2 100%  Physical Exam General: nad Cardiac: regular rate Lungs: clear Psych: cooperative  ED Course / MDM  EKG:EKG Interpretation Date/Time:  Monday June 09 2023 11:33:26 EST Ventricular Rate:  76 PR Interval:  119 QRS Duration:  88 QT Interval:  356 QTC Calculation: 401 R Axis:   21  Text Interpretation: Sinus rhythm Ventricular premature complex Borderline short PR interval Confirmed by Elayne Snare ([id number]) on 06/09/2023 12:31:19 PM  I have reviewed the labs performed to date as well as medications administered while in observation.  Recent changes in the last 24 hours include none.  Plan  Current plan is for going to [place] today.    Gwyneth Sprout, MD 06/11/23 [phone]

## 2023-06-11 NOTE — ED Notes (Signed)
Called [facility] this Am PT on waiting list

## 2023-06-12 ENCOUNTER — Telehealth (HOSPITAL_COMMUNITY): Payer: Self-pay | Admitting: *Deleted

## 2023-06-12 NOTE — Telephone Encounter (Signed)
Father called for note stating date pt was in ED.

## 2023-08-18 ENCOUNTER — Emergency Department (HOSPITAL_COMMUNITY)
Admission: EM | Admit: 2023-08-18 | Discharge: 2023-08-19 | Disposition: A | Discharge status: Other Acute Inpatient Hospital | Payer: Self-pay | Attending: Emergency Medicine | Admitting: Emergency Medicine

## 2023-08-18 ENCOUNTER — Encounter (HOSPITAL_COMMUNITY): Payer: Self-pay

## 2023-08-18 ENCOUNTER — Other Ambulatory Visit: Payer: Self-pay

## 2023-08-18 DIAGNOSIS — F23 Brief psychotic disorder: Secondary | ICD-10-CM | POA: Diagnosis present

## 2023-08-18 DIAGNOSIS — R451 Restlessness and agitation: Secondary | ICD-10-CM

## 2023-08-18 DIAGNOSIS — F201 Disorganized schizophrenia: Secondary | ICD-10-CM | POA: Diagnosis present

## 2023-08-18 DIAGNOSIS — E876 Hypokalemia: Secondary | ICD-10-CM | POA: Insufficient documentation

## 2023-08-18 DIAGNOSIS — F29 Unspecified psychosis not due to a substance or known physiological condition: Secondary | ICD-10-CM | POA: Insufficient documentation

## 2023-08-18 MED ORDER — DIPHENHYDRAMINE HCL 50 MG/ML IJ SOLN
25.0000 mg | Freq: Once | INTRAMUSCULAR | Status: AC
  Administered 2023-08-18: 25 mg via INTRAMUSCULAR
  Filled 2023-08-18: qty 1

## 2023-08-18 MED ORDER — ZIPRASIDONE MESYLATE 20 MG IM SOLR
10.0000 mg | Freq: Once | INTRAMUSCULAR | Status: AC
  Administered 2023-08-18: 10 mg via INTRAMUSCULAR
  Filled 2023-08-18: qty 20

## 2023-08-18 MED ORDER — STERILE WATER FOR INJECTION IJ SOLN
INTRAMUSCULAR | Status: AC
  Filled 2023-08-18: qty 10
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
## 2023-08-18 NOTE — ED Notes (Signed)
 Pt refused blood draw, nurse aware.  KM
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
## 2023-08-19 ENCOUNTER — Inpatient Hospital Stay (HOSPITAL_COMMUNITY)
Admission: AD | Admit: 2023-08-19 | Discharge: 2023-09-06 | DRG: 885 | Disposition: A | Discharge status: Home / Self Care | Source: Intra-hospital | Attending: Psychiatry | Admitting: Psychiatry

## 2023-08-19 ENCOUNTER — Encounter (HOSPITAL_COMMUNITY): Payer: Self-pay | Admitting: Nurse Practitioner

## 2023-08-19 DIAGNOSIS — F201 Disorganized schizophrenia: Secondary | ICD-10-CM | POA: Diagnosis present

## 2023-08-19 DIAGNOSIS — F17200 Nicotine dependence, unspecified, uncomplicated: Secondary | ICD-10-CM | POA: Diagnosis present

## 2023-08-19 DIAGNOSIS — G4700 Insomnia, unspecified: Secondary | ICD-10-CM | POA: Diagnosis present

## 2023-08-19 DIAGNOSIS — Z9151 Personal history of suicidal behavior: Secondary | ICD-10-CM

## 2023-08-19 DIAGNOSIS — F23 Brief psychotic disorder: Secondary | ICD-10-CM

## 2023-08-19 DIAGNOSIS — Z8249 Family history of ischemic heart disease and other diseases of the circulatory system: Secondary | ICD-10-CM

## 2023-08-19 DIAGNOSIS — Z91148 Patient's other noncompliance with medication regimen for other reason: Secondary | ICD-10-CM

## 2023-08-19 DIAGNOSIS — F209 Schizophrenia, unspecified: Principal | ICD-10-CM | POA: Diagnosis present

## 2023-08-19 DIAGNOSIS — Z79899 Other long term (current) drug therapy: Secondary | ICD-10-CM | POA: Diagnosis not present

## 2023-08-19 LAB — CBC
HCT: 42.4 % (ref 39.0–52.0)
Hemoglobin: 14.1 g/dL (ref 13.0–17.0)
MCH: 28.7 pg (ref 26.0–34.0)
MCHC: 33.3 g/dL (ref 30.0–36.0)
MCV: 86.2 fL (ref 80.0–100.0)
Platelets: 253 10*3/uL (ref 150–400)
RBC: 4.92 MIL/uL (ref 4.22–5.81)
RDW: 12.4 % (ref 11.5–15.5)
WBC: 8.3 10*3/uL (ref 4.0–10.5)
nRBC: 0 % (ref 0.0–0.2)

## 2023-08-19 LAB — COMPREHENSIVE METABOLIC PANEL WITH GFR
ALT: 12 U/L (ref 0–44)
AST: 21 U/L (ref 15–41)
Albumin: 4.2 g/dL (ref 3.5–5.0)
Alkaline Phosphatase: 75 U/L (ref 38–126)
Anion gap: 12 (ref 5–15)
BUN: 14 mg/dL (ref 6–20)
CO2: 19 mmol/L — ABNORMAL LOW (ref 22–32)
Calcium: 9.5 mg/dL (ref 8.9–10.3)
Chloride: 106 mmol/L (ref 98–111)
Creatinine, Ser: 0.94 mg/dL (ref 0.61–1.24)
GFR, Estimated: >60 mL/min (ref >60)
Glucose, Bld: 99 mg/dL (ref 70–99)
Potassium: 3.3 mmol/L — ABNORMAL LOW (ref 3.5–5.1)
Sodium: 137 mmol/L (ref 135–145)
Total Bilirubin: 1.1 mg/dL (ref 0.0–1.2)
Total Protein: 7.2 g/dL (ref 6.5–8.1)

## 2023-08-19 LAB — RAPID URINE DRUG SCREEN, HOSP PERFORMED
Amphetamines: NOT DETECTED
Barbiturates: NOT DETECTED
Benzodiazepines: NOT DETECTED
Cocaine: NOT DETECTED
Opiates: NOT DETECTED
Tetrahydrocannabinol: NOT DETECTED

## 2023-08-19 LAB — ETHANOL: Alcohol, Ethyl (B): <10 mg/dL (ref <10)

## 2023-08-19 MED ORDER — FLUPHENAZINE HCL 5 MG PO TABS
5.0000 mg | ORAL_TABLET | Freq: Two times a day (BID) | ORAL | Status: DC
  Filled 2023-08-19 (×2): qty 1

## 2023-08-19 MED ORDER — MAGNESIUM HYDROXIDE 400 MG/5ML PO SUSP: 30.0000 mL | Freq: Every day | ORAL | Status: DC | PRN

## 2023-08-19 MED ORDER — NICOTINE 14 MG/24HR TD PT24
14.0000 mg | MEDICATED_PATCH | Freq: Every day | TRANSDERMAL | Status: DC
  Filled 2023-08-19 (×14): qty 1

## 2023-08-19 MED ORDER — BENZTROPINE MESYLATE 0.5 MG PO TABS
0.5000 mg | ORAL_TABLET | Freq: Two times a day (BID) | ORAL | Status: DC
Start: 2023-08-19 — End: 2023-08-19
  Filled 2023-08-19 (×2): qty 1

## 2023-08-19 MED ORDER — HYDROXYZINE HCL 25 MG PO TABS
25.0000 mg | ORAL_TABLET | Freq: Three times a day (TID) | ORAL | Status: DC | PRN
  Filled 2023-08-19: qty 1

## 2023-08-19 MED ORDER — DIVALPROEX SODIUM 250 MG PO DR TAB
500.0000 mg | DELAYED_RELEASE_TABLET | Freq: Two times a day (BID) | ORAL | Status: DC
  Filled 2023-08-19: qty 2

## 2023-08-19 MED ORDER — FLUPHENAZINE HCL 5 MG PO TABS
5.0000 mg | ORAL_TABLET | Freq: Two times a day (BID) | ORAL | Status: DC
  Filled 2023-08-19 (×4): qty 1

## 2023-08-19 MED ORDER — OLANZAPINE 10 MG IM SOLR
10.0000 mg | Freq: Three times a day (TID) | INTRAMUSCULAR | Status: DC | PRN
  Administered 2023-08-19 – 2023-09-03 (×5): 10 mg via INTRAMUSCULAR
  Filled 2023-08-19 (×4): qty 10

## 2023-08-19 MED ORDER — PROPRANOLOL HCL 10 MG PO TABS
5.0000 mg | ORAL_TABLET | Freq: Two times a day (BID) | ORAL | Status: DC
  Filled 2023-08-19: qty 1

## 2023-08-19 MED ORDER — BENZTROPINE MESYLATE 0.5 MG PO TABS
0.5000 mg | ORAL_TABLET | Freq: Two times a day (BID) | ORAL | Status: DC
  Administered 2023-08-26 – 2023-09-03 (×6): 0.5 mg via ORAL
  Filled 2023-08-19 (×41): qty 1

## 2023-08-19 MED ORDER — TRAZODONE HCL 50 MG PO TABS
50.0000 mg | ORAL_TABLET | Freq: Every evening | ORAL | Status: DC | PRN
  Administered 2023-08-30: 50 mg via ORAL
  Filled 2023-08-19 (×2): qty 1

## 2023-08-19 MED ORDER — HYDROXYZINE HCL 25 MG PO TABS
25.0000 mg | ORAL_TABLET | Freq: Three times a day (TID) | ORAL | Status: DC | PRN
  Administered 2023-08-30 – 2023-09-05 (×3): 25 mg via ORAL
  Filled 2023-08-19 (×5): qty 1

## 2023-08-19 MED ORDER — DIVALPROEX SODIUM 500 MG PO DR TAB
500.0000 mg | DELAYED_RELEASE_TABLET | Freq: Two times a day (BID) | ORAL | Status: DC
  Administered 2023-08-19 – 2023-09-06 (×35): 500 mg via ORAL
  Filled 2023-08-19 (×46): qty 1

## 2023-08-19 MED ORDER — LORAZEPAM 1 MG PO TABS
1.0000 mg | ORAL_TABLET | Freq: Four times a day (QID) | ORAL | Status: DC | PRN
  Administered 2023-08-30 – 2023-08-31 (×3): 1 mg via ORAL
  Filled 2023-08-19 (×5): qty 1

## 2023-08-19 MED ORDER — ACETAMINOPHEN 325 MG PO TABS
650.0000 mg | ORAL_TABLET | Freq: Four times a day (QID) | ORAL | Status: DC | PRN
  Administered 2023-08-25 – 2023-08-29 (×4): 650 mg via ORAL
  Filled 2023-08-19 (×5): qty 2

## 2023-08-19 MED ORDER — OLANZAPINE 5 MG PO TBDP: 5.0000 mg | ORAL_TABLET | Freq: Three times a day (TID) | ORAL | Status: DC | PRN

## 2023-08-19 MED ORDER — PROPRANOLOL HCL 10 MG PO TABS
5.0000 mg | ORAL_TABLET | Freq: Two times a day (BID) | ORAL | Status: DC
  Administered 2023-08-28 – 2023-09-02 (×2): 5 mg via ORAL
  Filled 2023-08-19 (×9): qty 0.5
  Filled 2023-08-19: qty 1
  Filled 2023-08-19 (×10): qty 0.5
  Filled 2023-08-19: qty 1
  Filled 2023-08-19 (×21): qty 0.5

## 2023-08-19 MED ORDER — TRAZODONE HCL 50 MG PO TABS: 50.0000 mg | ORAL_TABLET | Freq: Every evening | ORAL | Status: DC | PRN

## 2023-08-19 MED ORDER — ALUM & MAG HYDROXIDE-SIMETH 200-200-20 MG/5ML PO SUSP: 30.0000 mL | ORAL | Status: DC | PRN

## 2023-08-19 MED ORDER — OLANZAPINE 10 MG IM SOLR
5.0000 mg | Freq: Three times a day (TID) | INTRAMUSCULAR | Status: DC | PRN
  Filled 2023-08-19: qty 10

## 2023-08-19 NOTE — ED Notes (Signed)
 IVC paperwork in blue zone, expires 08/19/23, envelope # [id number] (case # given by clerk of court during week)

## 2023-08-19 NOTE — Progress Notes (Signed)
 Pt has been accepted to [facility] on 08/19/2023 Bed assignment: 500-1   Pt meets inpatient criteria per: [name] NP  Attending Physician will be Dr. Zouev MD   Report can be called to: Adult unit: [phone]  Pt can arrive after discharge   Care Team Notified: [facility]  Kathryn Parish RN, [name] NP, Desiree Florence RN, Arla Lab RN   [place] Pluma Diniz LCSW-A   08/19/2023 10:52 AM

## 2023-08-19 NOTE — ED Notes (Signed)
 Packet with IVC and other paperwork provided to [name].  Pt has left the premesis with [name].

## 2023-08-19 NOTE — Plan of Care (Signed)
  Problem: Safety: Goal: Periods of time without injury will increase Outcome: Progressing   Problem: Coping: Goal: Ability to demonstrate self-control will improve Outcome: Not Progressing   Problem: Coping: Goal: Ability to demonstrate self-control will improve Outcome: Not Progressing

## 2023-08-19 NOTE — ED Notes (Signed)
 Pt is refusing his oral meds.
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
## 2023-08-19 NOTE — [facility] Group Notes (Signed)
 [facility] Group Notes:  (Nursing/MHT/Case Management/Adjunct)  Date:  08/19/2023  Time:  2000  Type of Therapy:   wrap up group  Participation Level:  Did Not Attend  Participation Quality:   Did not attend  Affect:   Did not attend  Cognitive:   Did not attend  Insight:  None  Engagement in Group:   Did not attend  Modes of Intervention:   Did not attend  Summary of Progress/Problems: Pt were aware of group but refused.  Tita Form 08/19/2023, 8:22 PM

## 2023-08-19 NOTE — ED Notes (Signed)
 IVC paperwork in blue zone, expires 08/26/23, envelope # [id number] (case # given by clerk of court during week)

## 2023-08-19 NOTE — ED Notes (Signed)
[name] has been called for transport  ?
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
## 2023-08-20 ENCOUNTER — Encounter (HOSPITAL_COMMUNITY): Payer: Self-pay

## 2023-08-20 DIAGNOSIS — F201 Disorganized schizophrenia: Secondary | ICD-10-CM

## 2023-08-20 MED ORDER — CEPHALEXIN 500 MG PO CAPS
500.0000 mg | ORAL_CAPSULE | Freq: Three times a day (TID) | ORAL | Status: DC
  Administered 2023-08-25 – 2023-09-06 (×36): 500 mg via ORAL
  Filled 2023-08-20 (×61): qty 1

## 2023-08-20 MED ORDER — ARIPIPRAZOLE 5 MG PO TABS
5.0000 mg | ORAL_TABLET | Freq: Every day | ORAL | Status: DC
  Filled 2023-08-20 (×4): qty 1

## 2023-08-20 MED ORDER — ARIPIPRAZOLE 10 MG PO TABS
10.0000 mg | ORAL_TABLET | Freq: Every day | ORAL | Status: DC
  Filled 2023-08-20 (×3): qty 1

## 2023-08-20 MED ORDER — POTASSIUM CHLORIDE CRYS ER 20 MEQ PO TBCR
20.0000 meq | EXTENDED_RELEASE_TABLET | Freq: Two times a day (BID) | ORAL | Status: AC
  Filled 2023-08-20 (×6): qty 1

## 2023-08-20 MED ORDER — ARIPIPRAZOLE 5 MG PO TABS
5.0000 mg | ORAL_TABLET | Freq: Every day | ORAL | Status: DC
Start: 2023-08-21 — End: 2023-08-20
  Filled 2023-08-20: qty 1
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
## 2023-08-20 NOTE — Plan of Care (Signed)
?  Problem: Education: Goal: Emotional status will improve Outcome: Not Progressing Goal: Mental status will improve Outcome: Not Progressing   Problem: Coping: Goal: Ability to demonstrate self-control will improve Outcome: Not Progressing   

## 2023-08-20 NOTE — Progress Notes (Signed)
 Patient refused keflex  and potassium. Provider notified.

## 2023-08-20 NOTE — Progress Notes (Signed)
 D- Patient alert and oriented.  Denies SI, HI, AVH, and pain. Patient pacing hallway and doing lunges. Patient refused all medication with the exception of depakote . Wound on right hand free from pink, moist, and without drainage.  A- Dressing applied to wound. Support and encouragement provided.  Routine safety checks conducted every 15 minutes.  Patient informed to notify staff with problems or concerns.  R- No adverse drug reactions noted. Patient contracts for safety at this time. Patient compliant with medications and treatment plan. Patient receptive, calm, and cooperative. Patient interacts well with others on the unit.  Patient remains safe at this time.     08/20/23 0759  Psych Admission Type (Psych Patients Only)  Admission Status Involuntary  Psychosocial Assessment  Patient Complaints None  Eye Contact Intense  Facial Expression Animated  Affect Preoccupied;Labile  Speech Pressured;Tangential  Interaction Attention-seeking;Demanding;Intrusive  Motor Activity Hyperactive;Restless;Fidgety  Appearance/Hygiene In scrubs  Behavior Characteristics Resistant to care;Hyperactive  Mood Labile;Preoccupied  Thought Process  Coherency Loose associations;Disorganized;Flight of ideas  Content Magical thinking;Religiosity;Paranoia  Delusions Paranoid;Grandeur  Perception Hallucinations  Hallucination Auditory  Judgment Poor  Confusion Moderate  Danger to Self  Current suicidal ideation? Denies  Agreement Not to Harm Self Yes  Description of Agreement Verbal  Danger to Others  Danger to Others None reported or observed

## 2023-08-20 NOTE — BH IP Treatment Plan (Signed)
 Interdisciplinary Treatment and Diagnostic Plan Update  08/20/2023 Time of Session: 1135am Brendan Hogan MRN: [id number]  Principal Diagnosis: Schizophrenia [facility])  Secondary Diagnoses: Principal Problem:   Schizophrenia (HCC)   Current Medications:  Current Facility-Administered Medications  Medication Dose Route Frequency Provider Last Rate Last Admin   acetaminophen  (TYLENOL ) tablet 650 mg  650 mg Oral Q6H PRN [name], NP       alum & mag hydroxide-simeth (MAALOX/MYLANTA) 200-200-20 MG/5ML suspension 30 mL  30 mL Oral Q4H PRN [name], NP       benztropine  (COGENTIN ) tablet 0.5 mg  0.5 mg Oral BID [name], NP       cephALEXin  (KEFLEX ) capsule 500 mg  500 mg Oral Q8H Nwoko, Agnes I, NP       divalproex  (DEPAKOTE ) DR tablet 500 mg  500 mg Oral Q12H [name], NP   500 mg at 08/20/23 0759   fluPHENAZine  (PROLIXIN ) tablet 5 mg  5 mg Oral Q12H [name], NP       hydrOXYzine  (ATARAX ) tablet 25 mg  25 mg Oral TID PRN [name], NP       LORazepam  (ATIVAN ) tablet 1 mg  1 mg Oral Q6H PRN [name], NP       magnesium  hydroxide (MILK OF MAGNESIA) suspension 30 mL  30 mL Oral Daily PRN [name], NP       nicotine  (NICODERM CQ  - dosed in mg/24 hours) patch 14 mg  14 mg Transdermal Daily Ntuen, Tina C, FNP       OLANZapine  (ZYPREXA ) injection 10 mg  10 mg Intramuscular TID PRN [name], NP   10 mg at 08/19/23 1502   OLANZapine  (ZYPREXA ) injection 5 mg  5 mg Intramuscular TID PRN [name], NP       OLANZapine  zydis (ZYPREXA ) disintegrating tablet 5 mg  5 mg Oral TID PRN [name], NP       potassium chloride  SA (KLOR-CON  M) CR tablet 20 mEq  20 mEq Oral BID Nwoko, Agnes I, NP       propranolol  (INDERAL ) tablet 5 mg  5 mg Oral Q12H [name], NP       traZODone  (DESYREL ) tablet 50 mg  50 mg Oral QHS PRN [name], NP       PTA Medications: Medications Prior to Admission  Medication Sig Dispense  Refill Last Dose/Taking   divalproex  (DEPAKOTE ) 500 MG DR tablet Take 1 tablet (500 mg total) by mouth every 12 (twelve) hours. For mood stabilization. 60 tablet 0 08/20/2023 Morning   benztropine  (COGENTIN ) 0.5 MG tablet Take 1 tablet (0.5 mg total) by mouth 2 (two) times daily. For prevention of drug induced tremors. (Patient not taking: Reported on 08/20/2023) 60 tablet 30 Not Taking   fluPHENAZine  (PROLIXIN ) 5 MG tablet Take 1 tablet (5 mg total) by mouth every 12 (twelve) hours. For mood control (Patient not taking: Reported on 08/20/2023) 60 tablet 0 Not Taking   hydrOXYzine  (ATARAX ) 25 MG tablet Take 1 tablet (25 mg total) by mouth 3 (three) times daily as needed for anxiety. (Patient not taking: Reported on 08/20/2023) 90 tablet 0 Not Taking   nicotine  (NICODERM CQ  - DOSED IN MG/24 HOURS) 21 mg/24hr patch Place 1 patch (21 mg total) onto the skin daily. (May buy from over the counter): Foe smoking cessation. (Patient not taking: Reported on 08/20/2023)   Not Taking   nicotine  polacrilex (NICORETTE ) 2 MG gum Take 1 each (2 mg total) by mouth as needed. (May  buy from over the counter: For smoking cessation. (Patient not taking: Reported on 08/20/2023)   Not Taking   propranolol  (INDERAL ) 10 MG tablet Take 0.5 tablets (5 mg total) by mouth every 12 (twelve) hours. For anxiety (Patient not taking: Reported on 08/20/2023) 60 tablet 0 Not Taking   traZODone  (DESYREL ) 50 MG tablet Take 1 tablet (50 mg total) by mouth at bedtime as needed for sleep. (Patient not taking: Reported on 08/20/2023) 30 tablet 0 Not Taking    Patient Stressors:    Patient Strengths:    Treatment Modalities: Medication Management, Group therapy, Case management,  1 to 1 session with clinician, Psychoeducation, Recreational therapy.   Physician Treatment Plan for Primary Diagnosis: Schizophrenia (HCC) Long Term Goal(s): Improvement in symptoms so as ready for discharge   Short Term Goals: Ability to identify changes in  lifestyle to reduce recurrence of condition will improve Ability to demonstrate self-control will improve Ability to identify and develop effective coping behaviors will improve Ability to maintain clinical measurements within normal limits will improve Compliance with prescribed medications will improve Ability to identify triggers associated with substance abuse/mental health issues will improve  Medication Management: Evaluate patient's response, side effects, and tolerance of medication regimen.  Therapeutic Interventions: 1 to 1 sessions, Unit Group sessions and Medication administration.  Evaluation of Outcomes: Not Progressing  Physician Treatment Plan for Secondary Diagnosis: Principal Problem:   Schizophrenia (HCC)  Long Term Goal(s): Improvement in symptoms so as ready for discharge   Short Term Goals: Ability to identify changes in lifestyle to reduce recurrence of condition will improve Ability to demonstrate self-control will improve Ability to identify and develop effective coping behaviors will improve Ability to maintain clinical measurements within normal limits will improve Compliance with prescribed medications will improve Ability to identify triggers associated with substance abuse/mental health issues will improve     Medication Management: Evaluate patient's response, side effects, and tolerance of medication regimen.  Therapeutic Interventions: 1 to 1 sessions, Unit Group sessions and Medication administration.  Evaluation of Outcomes: Not Progressing   RN Treatment Plan for Primary Diagnosis: Schizophrenia (HCC) Long Term Goal(s): Knowledge of disease and therapeutic regimen to maintain health will improve  Short Term Goals: Ability to remain free from injury will improve, Ability to verbalize frustration and anger appropriately will improve, Ability to demonstrate self-control, Ability to participate in decision making will improve, Ability to verbalize  feelings will improve, Ability to disclose and discuss suicidal ideas, Ability to identify and develop effective coping behaviors will improve, and Compliance with prescribed medications will improve  Medication Management: RN will administer medications as ordered by provider, will assess and evaluate patient's response and provide education to patient for prescribed medication. RN will report any adverse and/or side effects to prescribing provider.  Therapeutic Interventions: 1 on 1 counseling sessions, Psychoeducation, Medication administration, Evaluate responses to treatment, Monitor vital signs and CBGs as ordered, Perform/monitor CIWA, COWS, AIMS and Fall Risk screenings as ordered, Perform wound care treatments as ordered.  Evaluation of Outcomes: Not Progressing   LCSW Treatment Plan for Primary Diagnosis: Schizophrenia (HCC) Long Term Goal(s): Safe transition to appropriate next level of care at discharge, Engage patient in therapeutic group addressing interpersonal concerns.  Short Term Goals: Engage patient in aftercare planning with referrals and resources, Increase social support, Increase ability to appropriately verbalize feelings, Increase emotional regulation, Facilitate acceptance of mental health diagnosis and concerns, Facilitate patient progression through stages of change regarding substance use diagnoses and concerns, Identify triggers associated with  mental health/substance abuse issues, and Increase skills for wellness and recovery  Therapeutic Interventions: Assess for all discharge needs, 1 to 1 time with Social worker, Explore available resources and support systems, Assess for adequacy in community support network, Educate family and significant other(s) on suicide prevention, Complete Psychosocial Assessment, Interpersonal group therapy.  Evaluation of Outcomes: Not Progressing   Progress in Treatment: Attending groups: No. Participating in groups: No. Taking  medication as prescribed: No. Toleration medication: No. Family/Significant other contact made: No, will contact:  consents pending Patient understands diagnosis: No. Discussing patient identified problems/goals with staff: Yes. Medical problems stabilized or resolved: Yes. Denies suicidal/homicidal ideation: Yes. Issues/concerns per patient self-inventory: No.  New problem(s) identified: No, Describe:  none  New Short Term/Long Term Goal(s): medication stabilization, elimination of SI thoughts, development of comprehensive mental wellness plan.    Patient Goals:  "I am only taking Depakote "   Discharge Plan or Barriers: Patient recently admitted. CSW will continue to follow and assess for appropriate referrals and possible discharge planning.    Reason for Continuation of Hospitalization: Delusions  Hallucinations Medication stabilization  Estimated Length of Stay: 5-7 days  Last 3 [place] Suicide Severity Risk Score: Flowsheet Row Ad[facility] (Current) [facility] from [facility]18/2023 in [facility] ED from 06/09/2023 in [facility] Emergency Department at [facility]  C-SSRS RISK CATEGORY No Risk No Risk No Risk       Last [facility] 2/9 Scores:    07/11/2022   11:18 AM 06/27/2022    2:09 PM 02/18/2022   12:20 PM  Depression screen PHQ 2/9  Decreased Interest 0 1 0  Down, Depressed, Hopeless 1 0 0  PHQ - 2 Score 1 1 0  Altered sleeping 3 2 0  Tired, decreased energy 2 2 0  Change in appetite 1 1 0  Feeling bad or failure about yourself  0 0 0  Trouble concentrating 3 1 0  Moving slowly or fidgety/restless 3 2 0  Suicidal thoughts 0 0 0  PHQ-9 Score 13 9 0  Difficult doing work/chores   Not difficult at all    Scribe for Treatment Team: Vonzell Guerin, LCSWA 08/20/2023 12:52 PM

## 2023-08-20 NOTE — [facility] Suicide Risk Assessment (Signed)
 Suicide Risk Assessment  Admission Assessment    [facility] Admission Suicide Risk Assessment   Nursing information obtained from:  Patient  Demographic factors:  Male, Unemployed, Adolescent or young adult  Current Mental Status:  NA  Loss Factors:  Decrease in vocational status, Financial problems / change in socioeconomic status  Historical Factors:  Impulsivity  Risk Reduction Factors:  Sense of responsibility to family, Religious beliefs about death, Positive social support  Total Time spent with patient: 1.5 hours  Principal Problem: Schizophrenia (HCC)  Diagnosis:  Principal Problem:   Schizophrenia (HCC)  Subjective Data: See H&P  Continued Clinical Symptoms:  Alcohol Use Disorder Identification Test Final Score (AUDIT): 0 The "Alcohol Use Disorders Identification Test", Guidelines for Use in Primary Care, Second Edition.  World Science writer [facility]). Score between 0-7:  no or low risk or alcohol related problems. Score between 8-15:  moderate risk of alcohol related problems. Score between 16-19:  high risk of alcohol related problems. Score 20 or above:  warrants further diagnostic evaluation for alcohol dependence and treatment.  CLINICAL FACTORS:   Schizophrenia:   Paranoid or undifferentiated type Currently Psychotic Unstable or Poor Therapeutic Relationship Previous Psychiatric Diagnoses and Treatments  Musculoskeletal: Strength & Muscle Tone: within normal limits Gait & Station: normal Patient leans: N/A  Psychiatric Specialty Exam:  Presentation  General Appearance:  Casual  Eye Contact: Fair  Speech: -- (Disorganized.)  Speech Volume: Increased  Handedness: Right   Mood and Affect  Mood: -- (Psychotic.)  Affect: Congruent  Thought Process  Thought Processes: Disorganized  Descriptions of Associations:Tangential  Orientation:Partial  Thought Content:Delusions; Illogical; Scattered; Tangential  History of  Schizophrenia/Schizoaffective disorder:Yes  Duration of Psychotic Symptoms:Greater than six months  Hallucinations:Hallucinations: -- (Patient denies any AVH, however, he appears as if responding to any internal stimuli.)  Ideas of Reference:Delusions  Suicidal Thoughts:Suicidal Thoughts: No  Homicidal Thoughts:Homicidal Thoughts: No   Sensorium  Memory: Immediate Poor; Recent Fair; Remote Poor  Judgment: Impaired  Insight: Lacking   Executive Functions  Concentration: Fair  Attention Span: Fair  Recall: Poor  Fund of Knowledge: Poor  Language: Fair   Psychomotor Activity  Psychomotor Activity:Psychomotor Activity: Normal   Assets  Assets: Housing; Social Support   Sleep  Sleep:Sleep: Fair Number of Hours of Sleep: 5    Physical Exam: See H&P.  Blood pressure 96/61, pulse 100, temperature 97.8 F (36.6 C), temperature source Oral, resp. rate 20, height 5\' 5"  (1.651 m), weight 56.1 kg, SpO2 100%. Body mass index is 20.57 kg/m.   COGNITIVE FEATURES THAT CONTRIBUTE TO RISK:  Loss of executive function    SUICIDE RISK: Probably patient is at increased risks for self harm or harm to others as he presents with delusional thoughts, disorganized behaviors & presently psychotic.  PLAN OF CARE: See H&P.  I certify that inpatient services furnished can reasonably be expected to improve the patient's condition.   Asuncion Layer, NP, pmhnp, fnp-bc 08/20/2023, 12:21 PM

## 2023-08-20 NOTE — Progress Notes (Signed)
Patient refused afternoon medications.

## 2023-08-20 NOTE — Plan of Care (Signed)

## 2023-08-20 NOTE — Group Note (Signed)
 Recreation Therapy Group Note   Group Topic:Coping Skills  Group Date: 08/20/2023 Start Time: 1036 End Time: 1056 Facilitators: Martina Brodbeck-McCall, LRT,CTRS Location: 500 [address] Dayroom   Group Topic: Coping Skills   Goal Area(s) Addresses: Patient will define what a coping skill is. Patient will create a list of healthy coping skills beginning with each letter of the alphabet. Patient will successfully identify positive coping skills they can use post d/c.  Patient will acknowledge benefit(s) of using learned coping skills post d/c.  Intervention: Worksheet   Activity: Coping A to Z. Patient asked to identify what a coping skill is and when they use them. Patients with Clinical research associate discussed healthy versus unhealthy coping skills. Next patients were given a blank worksheet titled "Coping Skills A-Z". Patients were instructed to come up with at least one positive coping skill per letter of the alphabet. Patients were given 15 minutes to brainstorm before ideas were presented to the large group. Patients and LRT debriefed on the importance of coping skill selection based on situation and back-up plans when a skill tried is not effective. At the end of group, patients were given an handout of alphabetized strategies to keep for future reference.   Education: Pharmacologist, Scientist, physiological, Discharge Planning.    Education Outcome: Acknowledges education/Verbalizes understanding/In group clarification offered/Additional education needed   Affect/Mood: N/A   Participation Level: Did not attend    Clinical Observations/Individualized Feedback:     Plan: Continue to engage patient in RT group sessions 2-3x/week.   Brendan Hogan, LRT,CTRS 08/20/2023 1:18 PM

## 2023-08-20 NOTE — B[facility] Group Notes (Signed)
 Adult Psychoeducational Group Note  Date:  08/20/2023 Time:  9:55 PM  Group Topic/Focus:  Wrap-Up Group:   The focus of this group is to help patients review their daily goal of treatment and discuss progress on daily workbooks.  Participation Level:  Active  Participation Quality:  Appropriate  Affect:  Appropriate  Cognitive:  Appropriate  Insight: Appropriate  Engagement in Group:  Engaged  Modes of Intervention:  Discussion  Additional Comments:  Patient attended and participated in the Wrap-up group.  Becki Bouton 08/20/2023, 9:55 PM

## 2023-08-20 NOTE — Progress Notes (Signed)
   08/20/23 2000  Psych Admission Type (Psych Patients Only)  Admission Status Involuntary  Psychosocial Assessment  Patient Complaints None  Eye Contact Suspiciousness;Intense  Facial Expression Animated  Affect Labile;Preoccupied;Blunted  Speech Pressured;Tangential  Interaction Dominating;Demanding;Hypervigilant;Intrusive  Motor Activity Hyperactive;Fidgety  Appearance/Hygiene Disheveled;In scrubs  Behavior Characteristics Resistant to care;Hyperactive  Mood Labile;Preoccupied  Thought Process  Coherency Loose associations;Flight of ideas  Content Preoccupation;Magical thinking  Delusions Paranoid;Grandeur  Perception Hallucinations  Hallucination Auditory  Judgment Poor  Confusion Moderate  Danger to Self  Current suicidal ideation? Denies  Agreement Not to Harm Self Yes  Description of Agreement verbal  Danger to Others  Danger to Others None reported or observed

## 2023-08-21 DIAGNOSIS — F201 Disorganized schizophrenia: Secondary | ICD-10-CM | POA: Diagnosis not present

## 2023-08-21 LAB — HEMOGLOBIN A1C
Hgb A1c MFr Bld: 4.8 % (ref 4.8–5.6)
Mean Plasma Glucose: 91.06 mg/dL

## 2023-08-21 LAB — LIPID PANEL
Cholesterol: 131 mg/dL (ref 0–200)
HDL: 43 mg/dL (ref >40)
LDL Cholesterol: 77 mg/dL (ref 0–99)
Total CHOL/HDL Ratio: 3 ratio
Triglycerides: 53 mg/dL (ref <150)
VLDL: 11 mg/dL (ref 0–40)

## 2023-08-21 LAB — TSH: TSH: 2.363 u[IU]/mL (ref 0.350–4.500)

## 2023-08-21 MED ORDER — SODIUM CHLORIDE 0.9 % IN NEBU
INHALATION_SOLUTION | RESPIRATORY_TRACT | Status: AC
  Filled 2023-08-21: qty 12

## 2023-08-21 MED ORDER — SILVER SULFADIAZINE 1 % EX CREA
TOPICAL_CREAM | Freq: Every day | CUTANEOUS | Status: DC
  Administered 2023-08-23 – 2023-09-05 (×6): 1 via TOPICAL
  Filled 2023-08-21 (×4): qty 85

## 2023-08-21 NOTE — Progress Notes (Addendum)
 Cleansed left hand wound with normal saline- Applied Silvadene  per MD order. Serosanguineous drainage noted on old dressing. Pt denies pain. Wound dressed with non adherent telfa dressing, and wrapped in gauze. Pt continues to refuse oral antibiotics, despite encouragement from staff.

## 2023-08-21 NOTE — Plan of Care (Signed)
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
## 2023-08-21 NOTE — Progress Notes (Signed)
 D. Pt alert and oriented- easily aroused from sleep for morning meds. Pt refused all of his medications except for Depakote . Patient was reminded of the importance of taking his Keflex  to prevent infection, but pt continued to refuse. Provider made aware. When asked why he didn't want his other meds, pt responded, "I want to go home. I will take them when I'm home." Pt currently denies SI/HI and AVH and pain A. Labs and vitals monitored. Pt supported emotionally and encouraged to express concerns and ask questions.   R. Pt remains safe with 15 minute checks. Will continue POC.
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
## 2023-08-21 NOTE — Progress Notes (Incomplete)
 Patient remains dysphoric and manic.  He continues to express delusions that his parents are not his real parents.  He has been refusing Abilify  but taking Depakote .  He has no insight into his illness as he does not feel like he needs medication.  He is also refusing his antibiotics without processing the risk of severe infection.  I have encouraged him to take his meds orally.  I informed him of the need to force his medications in a couple of days if he is not taking it orally. Patient will likely need a forced medication order if he continues to refuse his antipsychotic medication.

## 2023-08-21 NOTE — Group Note (Signed)
 Recreation Therapy Group Note   Group Topic:Other  Group Date: 08/21/2023 Start Time: 1000 End Time: 1140 Facilitators: Nikia Mangino-McCall, LRT,CTRS Location: [address] Hall Dayroom   Group Topic: Exercise/Wellness  Goal Area(s) Addresses:  Patient will verbalize benefit of exercise during group session. Patient will identify an exercise that can be completed post d/c. Patient will acknowledge benefits of exercise when used as a coping mechanism.   Intervention: Music  Activity: Patients were to take turns leading the group in the exercises of their choosing.  Education: Physical Activity, Health and Wellness  Education Outcome: Acknowledges understanding/In group clarification offered/Needs additional education.   Clinical Observations/Individualized Feedback: Due acuity on unit, LRT unable to complete group session. LRT assisted on the hall by staying in the dayroom.    Plan: Continue to engage patient in RT group sessions 2-3x/week.   Julez Huseby-McCall, LRT,CTRS 08/21/2023 11:53 AM
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
## 2023-08-21 NOTE — Plan of Care (Signed)
  Problem: Activity: Goal: Interest or engagement in activities will improve Outcome: Progressing   Problem: Physical Regulation: Goal: Ability to maintain clinical measurements within normal limits will improve Outcome: Progressing   Problem: Safety: Goal: Periods of time without injury will increase Outcome: Progressing

## 2023-08-21 NOTE — Progress Notes (Signed)
 At visitation, pt's father approached this nurse requesting to pass on information : According to father, "Patient responds better to fluphenazine  than other meds (when patient takes it), and sleeps well when taking mirtazapine ."

## 2023-08-21 NOTE — BHH Group Notes (Signed)
 B[facility] Group Notes:  (Nursing/MHT/Case Management/Adjunct)  Date:  08/21/2023  Time:  8:23 PM  Type of Therapy:  Psychoeducational Skills  Participation Level:  Did Not Attend  Participation Quality:  Resistant  Affect:  Resistant  Cognitive:  Lacking  Insight:  None  Engagement in Group:  None  Modes of Intervention:  Education  Summary of Progress/Problems: The patient did not attend group this evening.   Lyllian Gause S 08/21/2023, 8:23 PM

## 2023-08-22 DIAGNOSIS — F201 Disorganized schizophrenia: Secondary | ICD-10-CM | POA: Diagnosis not present

## 2023-08-22 NOTE — Plan of Care (Signed)
   Problem: Education: Goal: Emotional status will improve Outcome: Not Progressing Goal: Mental status will improve Outcome: Not Progressing
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
## 2023-08-22 NOTE — Group Note (Signed)
 Recreation Therapy Group Note   Group Topic:Communication  Group Date: 08/22/2023 Start Time: 1022 End Time: 1040 Facilitators: Evren Shankland-McCall, LRT,CTRS Location: [address] Dayroom   Group Topic: Communication, Problem Solving   Goal Area(s) Addresses:  Patient will effectively listen to complete activity.  Patient will identify communication skills used to make activity successful.  Patient will identify how skills used during activity can be used to reach post d/c goals.    Intervention: Building surveyor Activity - Geometric pattern cards, pencils, blank paper    Activity: Geometric Drawings.  Three volunteers from the peer group will be shown an abstract picture with a particular arrangement of geometrical shapes.  Each round, one 'speaker' will describe the pattern, as accurately as possible without revealing the image to the group.  The remaining group members will listen and draw the picture to reflect how it is described to them. Patients with the role of 'listener' cannot ask clarifying questions but, may request that the speaker repeat a direction. Once the drawings are complete, the presenter will show the rest of the group the picture and compare how close each person came to drawing the picture. LRT will facilitate a post-activity discussion regarding effective communication and the importance of planning, listening, and asking for clarification in daily interactions with others.  Education: Environmental consultant, Active listening, Support systems, Discharge planning  Education Outcome: Acknowledges understanding/In group clarification offered/Needs additional education.    Affect/Mood: N/A   Participation Level: Did not attend    Clinical Observations/Individualized Feedback:     Plan: Continue to engage patient in RT group sessions 2-3x/week.   Malikah Lakey-McCall, LRT,CTRS  08/22/2023 1:28 PM

## 2023-08-22 NOTE — Progress Notes (Signed)
   08/22/23 0100  Psych Admission Type (Psych Patients Only)  Admission Status Involuntary  Psychosocial Assessment  Eye Contact Suspiciousness;Intense  Facial Expression Animated  Affect Labile;Preoccupied;Blunted  Speech Pressured;Tangential  Interaction Dominating;Demanding;Hypervigilant;Intrusive  Motor Activity Hyperactive;Fidgety  Appearance/Hygiene Disheveled;In scrubs  Behavior Characteristics Resistant to care  Mood Preoccupied  Thought Process  Coherency Loose associations;Flight of ideas  Content Preoccupation;Magical thinking  Delusions Paranoid;Grandeur  Perception Hallucinations  Hallucination Auditory  Judgment Poor  Confusion Moderate  Danger to Self  Current suicidal ideation? Denies  Agreement Not to Harm Self Yes  Description of Agreement verbal  Danger to Others  Danger to Others None reported or observed

## 2023-08-22 NOTE — Progress Notes (Signed)
   08/22/23 1000  Psych Admission Type (Psych Patients Only)  Admission Status Involuntary  Psychosocial Assessment  Patient Complaints None  Eye Contact Intense;Suspiciousness  Facial Expression Animated  Affect Preoccupied  Air traffic controller Activity Fidgety  Appearance/Hygiene Disheveled  Behavior Characteristics Resistant to care  Mood Preoccupied  Thought Process  Coherency Disorganized  Content Preoccupation  Delusions Paranoid  Perception Hallucinations  Hallucination Auditory  Judgment Poor  Confusion None  Danger to Self  Current suicidal ideation? Denies  Danger to Others  Danger to Others None reported or observed   Dar Note: Patient presents with anxious affect and paranoid behaviors.  Patient is disorganized, flight of ideas and delusional.  Believed that his parents are not his biological parents.  Patient observed talking to himself and responding to internal stimuli.  Stated that he belongs to the royal family of [place].  Refused all his prescribed medication except Depakote .  Dressing to right hand changed.  No s/s of infection noted.  Patient encouraged to do ROM on right hand/fingers.  Patient brought back from [place] during lunch due to intrusive and disruptive behaviors.   Routine safety checks maintained.  Patient is safe on the unit.

## 2023-08-22 NOTE — BHH Group Notes (Signed)
 Adult Psychoeducational Group Note  Date:  08/22/2023 Time:  9:32 AM  Group Topic/Focus:  Goals Group:   The focus of this group is to help patients establish daily goals to achieve during treatment and discuss how the patient can incorporate goal setting into their daily lives to aide in recovery. Orientation:   The focus of this group is to educate the patient on the purpose and policies of crisis stabilization and provide a format to answer questions about their admission.  The group details unit policies and expectations of patients while admitted.  Participation Level:  Did Not Attend  Participation Quality:    Affect:    Cognitive:    Insight:   Engagement in Group:    Modes of Intervention:    Additional Comments:    Ancel Baltimore 08/22/2023, 9:32 AM

## 2023-08-22 NOTE — BH Assessment (Incomplete)
 Patient was up and in day room until 2130. Pt was dancing to music and conversing with other patients. Pt denies anxiety and depression. Pt. Denies AVH. Patient went in room early and did not take bedtime medicati

## 2023-08-22 NOTE — BHH Group Notes (Signed)
 Spirituality Group   Description: Participant directed exploration of values, beliefs and meaning   Following a brief framework of chaplain's role and ground rules of group behavior, participants are invited to share concerns or questions that engage spiritual life. Emphasis placed on common themes and shared experiences and ways to make meaning and clarify living into one's values.   Theory/Process/Goal: Utilize the theoretical framework of group therapy established by Derrell Flight, Relational Cultural Theory and Rogerian approaches to facilitate relational empathy and use of the "here and now" to foster reflection, self-awareness, and sharing.   Observations: Brendan Hogan was in and out of the room during group. His behavior was somewhat disruptive and one time required staff to call out when gesture toward another patient seemed threatening (though he was not actually aggressive).  Marlenne Ridge L. Minetta Aly, M.Div [phone]

## 2023-08-22 NOTE — BH Assessment (Signed)
 Patient was up and in day room until 2130. Pt was dancing to music and conversing with other patients. Pt denies anxiety and depression. Pt. Denies AVH. Patient went in room early and did not take bedtime medications. I knocked and entered room to ask patient to come and take his medications and pt. Was masturbating and stated I will be there in a little while. 15 min checks maintained.

## 2023-08-22 NOTE — Group Note (Signed)
 Date:  08/22/2023 Time:  9:11 PM  Group Topic/Focus:  Wrap-Up Group:   The focus of this group is to help patients review their daily goal of treatment and discuss progress on daily workbooks.    Participation Level:  Active  Participation Quality:  Appropriate  Affect:  Appropriate  Cognitive:  Appropriate  Insight: Appropriate  Engagement in Group:  Developing/Improving  Modes of Intervention:  Discussion  Additional Comments:  Pt stated his goal for today was to focus on his treatment plan. Pt stated he accomplished his goal today. Pt stated he did not talked with his doctor but did get a chance to speak with her social worker about his care today. Pt rated his overall day a 7 out of 10. Pt stated he enjoyed going outside with his peers today, Pt stated he made no calls today. Pt stated he felt better about himself today. Pt stated staff brought back all his meals today. Pt stated he took all medications provided today. Pt stated he attend all groups held today. Pt stated his appetite was pretty good today. Pt rated sleep last night was pretty good. Pt stated the goal tonight was to get some rest. Pt stated he had no physical pain tonight. Pt deny visual hallucinations and auditory issues tonight. Pt denies thoughts of harming himself or others. Pt stated he would alert staff if anything changed  Dwaine Gip 08/22/2023, 9:11 PM

## 2023-08-22 NOTE — Plan of Care (Signed)
   Problem: Education: Goal: Knowledge of [place] [facility] Education information/materials will improve Outcome: Progressing Goal: Emotional status will improve Outcome: Progressing Goal: Mental status will improve Outcome: Progressing

## 2023-08-23 DIAGNOSIS — F201 Disorganized schizophrenia: Secondary | ICD-10-CM | POA: Diagnosis not present

## 2023-08-23 LAB — VALPROIC ACID LEVEL: Valproic Acid Lvl: 67 ug/mL (ref 50–100)

## 2023-08-23 MED ORDER — FLUPHENAZINE HCL 2.5 MG/ML IJ SOLN
2.5000 mg | Freq: Two times a day (BID) | INTRAMUSCULAR | Status: DC
  Administered 2023-08-23: 2.5 mg via INTRAMUSCULAR
  Filled 2023-08-23: qty 10
  Filled 2023-08-23 (×8): qty 1

## 2023-08-23 MED ORDER — FLUPHENAZINE HCL 2.5 MG PO TABS
2.5000 mg | ORAL_TABLET | Freq: Two times a day (BID) | ORAL | Status: DC
  Administered 2023-08-24 – 2023-08-26 (×5): 2.5 mg via ORAL
  Filled 2023-08-23 (×8): qty 1

## 2023-08-23 NOTE — Plan of Care (Signed)
   Problem: Education: Goal: Emotional status will improve Outcome: Progressing Goal: Mental status will improve Outcome: Progressing Goal: Verbalization of understanding the information provided will improve Outcome: Progressing

## 2023-08-23 NOTE — Progress Notes (Signed)
   08/23/23 1029  Psych Admission Type (Psych Patients Only)  Admission Status Involuntary  Psychosocial Assessment  Patient Complaints Irritability  Eye Contact Fair  Facial Expression Angry  Affect Preoccupied;Labile;Irritable  Speech Pressured;Tangential  Interaction Attention-seeking  Motor Activity Restless;Fidgety  Appearance/Hygiene Disheveled  Behavior Characteristics Irritable;Aggressive physically;Agressive verbally  Mood Labile;Irritable  Thought Process  Coherency Disorganized;Loose associations  Content Preoccupation;Blaming others  Delusions Grandeur;Paranoid  Perception Hallucinations  Hallucination None reported or observed  Judgment Poor  Confusion None  Danger to Self  Current suicidal ideation? Denies  Agreement Not to Harm Self Yes  Description of Agreement Verbal  Danger to Others  Danger to Others None reported or observed

## 2023-08-23 NOTE — Progress Notes (Addendum)
 Dressing change performed. Wound noted to be red, malodorous, and with purulent drainage. Patient continues to refuse antibiotics, as well as all meds with the exception of depakote . MD informed and came to assess. Patient remains safe at this time.
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
## 2023-08-23 NOTE — [facility] Group Notes (Signed)
 Adult Psychoeducational Group Note  Date:  08/23/2023 Time:  12:49 PM  Group Topic/Focus:  Goals Group:   The focus of this group is to help patients establish daily goals to achieve during treatment and discuss how the patient can incorporate goal setting into their daily lives to aide in recovery. Orientation:   The focus of this group is to educate the patient on the purpose and policies of crisis stabilization and provide a format to answer questions about their admission.  The group details unit policies and expectations of patients while admitted.  Participation Level:  Did Not Attend  Participation Quality:    Affect:    Cognitive:    Insight:   Engagement in Group:    Modes of Intervention:    Additional Comments:    Ancel Baltimore 08/23/2023, 12:49 PM

## 2023-08-23 NOTE — Progress Notes (Signed)
 Second Opinion for Medications Against Objection  Reason for the Medication: The patient, without the benefit of the specific treatment measure, is incapable of participating in any available treatment plan that will give the patient a realistic opportunity of improving the patient's condition.   Consideration of Side Effects: Consideration of the side effects related to the medication plan has been given.   Rationale for Medication Administration: Patient has shown improvement in past hospitalizations with medication management.  He decompensates after discharge when he becomes medication noncompliant.    -----   At the present time, patient has no capacity to understand the need for treatment, refusing to take psychotropic medication, because of poor insight and judgment.  Patient states that they have no problem, no mental illness and refuses to consider taking medication, recommended by the psychiatrist- Prolixin .  Diagnosis: Paranoid Schizophrenia   Treatment plan/Opinion: -patient is mentally ill, as needs hospitalization -patient continues to display active symptoms of responding to internal stimuli and aggression -patient lacks the capacity to consent to treatment with medication   -patient is unable to rationally discuss medication options, risks versus benefit due to their symptoms. -treatment with medication and medication changes would be in their best interest -psychotropics are effective treatment for symptoms of psychosis   Based on my evaluation, the following medications are recommend and indicated for treatment of the patient's psychiatric diagnosis and symptoms:  -Prolixin  2.5 mg PO or 2.5 mg IM BID for psychosis and mood stability    Rainell Buoy MD Resident

## 2023-08-23 NOTE — [facility] Group Notes (Signed)
 Adult Psychoeducational Group Note  Date:  08/23/2023 Time:  9:00 PM  Group Topic/Focus:  Wrap-Up Group:   The focus of this group is to help patients review their daily goal of treatment and discuss progress on daily workbooks.  Participation Level:  Active  Participation Quality:  Appropriate  Affect:  Appropriate  Cognitive:  Appropriate  Insight: Appropriate  Engagement in Group:  Engaged  Modes of Intervention:  Discussion and Support  Additional Comments:  Pt told that today was a good day on the unit, the highlight of which was "having good food." On the subject of ways to stay well upon discharge, Pt mentioned only wanting to work out more. Pt rated his day a 6 out of 10.  Ulis Kaps Lee 08/23/2023, 9:00 PM
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
## 2023-08-24 DIAGNOSIS — F201 Disorganized schizophrenia: Secondary | ICD-10-CM | POA: Diagnosis not present

## 2023-08-24 NOTE — Plan of Care (Signed)
   Problem: Education: Goal: Knowledge of [facility] Education information/materials will improve Outcome: Progressing   Problem: Activity: Goal: Interest or engagement in activities will improve Outcome: Progressing   Problem: Coping: Goal: Ability to verbalize frustrations and anger appropriately will improve Outcome: Progressing

## 2023-08-24 NOTE — Plan of Care (Signed)

## 2023-08-24 NOTE — Progress Notes (Signed)
   08/24/23 2151  Psych Admission Type (Psych Patients Only)  Admission Status Involuntary  Psychosocial Assessment  Patient Complaints Anxiety  Eye Contact Fair  Facial Expression Animated  Affect Preoccupied  Speech Pressured  Interaction Childlike  Motor Activity Fidgety  Appearance/Hygiene Disheveled  Behavior Characteristics Cooperative;Appropriate to situation  Mood Anxious  Thought Process  Coherency Disorganized  Content Preoccupation  Delusions Paranoid  Perception WDL  Hallucination None reported or observed  Judgment Poor  Confusion None  Danger to Self  Current suicidal ideation? Denies  Agreement Not to Harm Self Yes  Description of Agreement verbal  Danger to Others  Danger to Others None reported or observed

## 2023-08-24 NOTE — Plan of Care (Signed)
   Problem: Education: Goal: Emotional status will improve Outcome: Not Progressing Goal: Mental status will improve Outcome: Not Progressing Goal: Verbalization of understanding the information provided will improve Outcome: Not Progressing

## 2023-08-24 NOTE — Progress Notes (Signed)
   08/23/23 2100  Psych Admission Type (Psych Patients Only)  Admission Status Involuntary  Psychosocial Assessment  Patient Complaints Irritability  Eye Contact Fair  Facial Expression Animated  Affect Preoccupied  Speech Pressured  Interaction Childlike  Motor Activity Fidgety  Appearance/Hygiene Disheveled  Behavior Characteristics Appropriate to situation  Mood Anxious  Thought Process  Coherency Disorganized  Content Preoccupation  Delusions Paranoid  Perception WDL  Hallucination None reported or observed  Judgment Poor  Confusion None  Danger to Self  Current suicidal ideation? Denies  Agreement Not to Harm Self Yes  Description of Agreement verbal  Danger to Others  Danger to Others None reported or observed

## 2023-08-24 NOTE — [facility] Group Notes (Signed)
 Adult Psychoeducational Group Note  Date:  08/24/2023 Time:  8:46 PM  Group Topic/Focus:  Wrap-Up Group:   The focus of this group is to help patients review their daily goal of treatment and discuss progress on daily workbooks.  Participation Level:  Active  Participation Quality:  Appropriate  Affect:  Appropriate  Cognitive:  Appropriate  Insight: Appropriate  Engagement in Group:  Engaged  Modes of Intervention:  Discussion and Support  Additional Comments:  Pt told that today was a good day on the unit, the highlight of which was "working out in my room." On the subject of goals for the coming week, Pt mentioned being focused only upon his discharge and was encouraged by the Writer to speak with his Treatment Team about this. Pt rated his day a 7 out of 10.  Dal Dubin 08/24/2023, 8:46 PM

## 2023-08-24 NOTE — Progress Notes (Signed)
 D- Patient alert and oriented.  Denies SI, HI, AVH, and pain. Patient pacing halls and performing lunges down the hall.   A- Patient agreed to take PO depakote  and PO prolixin  after encouragement, but continues to refuse all other medications. Support and encouragement provided.  Routine safety checks conducted every 15 minutes.  Patient informed to notify staff with problems or concerns.  R- rNo adverse drug reactions noted. Patient contracts for safety at this time. Patient remains safe at this time.

## 2023-08-24 NOTE — [facility] Suicide Risk Assessment (Signed)
 [facility] INPATIENT:  Family/Significant Other Suicide Prevention Education  Suicide Prevention Education:  Contact Attempts:  Magda Schneider (father) [phone], has been identified by the patient as the family member/significant other with whom the patient will be residing, and identified as the person(s) who will aid the patient in the event of a mental health crisis.  With written consent from the patient, two attempts were made to provide suicide prevention education, prior to and/or following the patient's discharge.  We were unsuccessful in providing suicide prevention education.  A suicide education pamphlet was given to the patient to share with family/significant other.   CSW called no pick up and no voicemail   Date and time of first attempt:08/24/2023/04:03 pm   Brendan Hogan O Itzael Liptak 08/24/2023, 4:02 PM
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
## 2023-08-25 ENCOUNTER — Encounter (HOSPITAL_COMMUNITY): Payer: Self-pay

## 2023-08-25 DIAGNOSIS — F201 Disorganized schizophrenia: Secondary | ICD-10-CM | POA: Diagnosis not present

## 2023-08-25 LAB — VITAMIN B12: Vitamin B-12: 256 pg/mL (ref 180–914)

## 2023-08-25 LAB — VITAMIN D 25 HYDROXY (VIT D DEFICIENCY, FRACTURES): Vit D, 25-Hydroxy: 34.85 ng/mL (ref 30–100)

## 2023-08-25 LAB — FOLATE: Folate: 16.1 ng/mL (ref >5.9)

## 2023-08-25 NOTE — Progress Notes (Signed)
   08/25/23 2146  Psych Admission Type (Psych Patients Only)  Admission Status Involuntary  Psychosocial Assessment  Patient Complaints None  Eye Contact Fair  Facial Expression Animated  Affect Preoccupied  Speech Pressured  Interaction Childlike  Motor Activity Fidgety  Appearance/Hygiene Disheveled  Behavior Characteristics Cooperative;Appropriate to situation  Mood Pleasant  Thought Process  Coherency Disorganized  Content Preoccupation  Delusions Paranoid  Perception WDL  Hallucination None reported or observed  Judgment Poor  Confusion None  Danger to Self  Current suicidal ideation? Denies  Agreement Not to Harm Self Yes  Description of Agreement verbal  Danger to Others  Danger to Others None reported or observed

## 2023-08-25 NOTE — Group Note (Signed)
 LCSW Group Therapy Note   Group Date: 08/25/2023 Start Time: 1300 End Time: 1400   Participation:  did not attend  Type of Therapy:  Group Therapy  Title:  Finding Balance: Using [employer] for Thoughtful Decisions  Objective:  the objective of this class is to help participants understand the concept of Wise Mind and learn how to apply it to real-life situations to make balanced, thoughtful decisions. Participants will gain tools to manage emotions, consider logic, and find a middle ground that leads to healthier responses and outcome.    Goals: Understand the concept of Wise Mind.  Participants will learn the difference between Emotional Mind, Reasonable Mind, and Agustina Aldrich Mind, and how Agustina Aldrich Mind helps in balancing emotions and logic to make thoughtful decisions. Recognize when you're in Emotional Mind or Reasonable Mind.  Participants will identify the signs of Emotional Mind and Reasonable Mind in their own reactions to situations and understand how to move into Wise Mind for more balanced responses. Practice applying Agustina Aldrich Mind to real-life situations.  Through scenarios and group activities, participants will practice using Wise Mind in everyday situations, learning how to acknowledge their emotions, think logically, and create solutions that are thoughtful and balanced.  Summary:  In this class, we explored the concept of Wise Mind--the balance between Emotional Mind and Reasonable Mind. We discussed how Emotional Mind can sometimes lead to impulsive, reactive decisions driven by intense feelings, and how Reasonable Mind might ignore feelings altogether, focusing only on facts and logic. Agustina Aldrich Mind is the middle ground that combines both, allowing you to consider your emotions and use logic to make balanced, thoughtful decisions.  We learned how to recognize when we're in Emotional Mind or Reasonable Mind, and practiced using Wise Mind in real-life situations. By using Beola [place], we can improve  how we handle challenging situations, make better decisions, and strengthen our relationships with others.  Therapeutic Modalities: Elements of DBT:  emotional regulation   Averil Digman O Dawn Convery, LCSWA 08/25/2023  5:14 PM

## 2023-08-25 NOTE — [facility] Group Notes (Signed)
 Adult Psychoeducational Group Note  Date:  08/25/2023 Time:  1:38 PM  Group Topic/Focus:  Goals Group:   The focus of this group is to help patients establish daily goals to achieve during treatment and discuss how the patient can incorporate goal setting into their daily lives to aide in recovery. Orientation:   The focus of this group is to educate the patient on the purpose and policies of crisis stabilization and provide a format to answer questions about their admission.  The group details unit policies and expectations of patients while admitted.  Participation Level:  Did Not Attend  Participation Quality:    Affect:    Cognitive:    Insight:   Engagement in Group:    Modes of Intervention:    Additional Comments:    Griselda Bramblett O 08/25/2023, 1:38 PM

## 2023-08-25 NOTE — [facility] Suicide Risk Assessment (Signed)
 [facility] INPATIENT:  Family/Significant Other Suicide Prevention Education  Suicide Prevention Education:  Contact Attempts: Magda Schneider (father) [phone], (name of family member/significant other) has been identified by the patient as the family member/significant other with whom the patient will be residing, and identified as the person(s) who will aid the patient in the event of a mental health crisis.  With verbal consent from the patient, two attempts were made to provide suicide prevention education, prior to and/or following the patient's discharge.  We were unsuccessful in providing suicide prevention education.   Date and time of second attempt:  08/25/2023 at 10:35 AM  There was no response.  Voicemail wasn't an option.   [facility] 08/25/2023, 10:37 AM
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
## 2023-08-25 NOTE — Plan of Care (Signed)
  Problem: Education: Goal: Emotional status will improve Outcome: Progressing Goal: Verbalization of understanding the information provided will improve Outcome: Progressing   Problem: Coping: Goal: Ability to verbalize frustrations and anger appropriately will improve Outcome: Not Progressing Goal: Ability to demonstrate self-control will improve Outcome: Not Progressing

## 2023-08-25 NOTE — Plan of Care (Signed)
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
## 2023-08-25 NOTE — Group Note (Signed)
 Date:  08/25/2023 Time:  8:52 PM  Group Topic/Focus:  Wrap-Up Group:   The focus of this group is to help patients review their daily goal of treatment and discuss progress on daily workbooks.    Participation Level:  Active  Participation Quality:  Appropriate  Affect:  Appropriate  Cognitive:  Appropriate  Insight: Appropriate  Engagement in Group:  Distracting  Modes of Intervention:  Education and Exploration  Additional Comments:  Patient attended and participated in group tonight. He reports that today he learn that his medication is important to take.  Eugena Herter Dacosta 08/25/2023, 8:52 PM

## 2023-08-25 NOTE — BH IP Treatment Plan (Signed)
 Interdisciplinary Treatment and Diagnostic Plan Update  08/25/2023 Time of Session: 1255PM - update Brendan Hogan MRN: [id number]  Principal Diagnosis: Disorganized schizophrenia [facility])  Secondary Diagnoses: Principal Problem:   Disorganized schizophrenia (HCC) Active Problems:   Schizophrenia (HCC)   Current Medications:  Current Facility-Administered Medications  Medication Dose Route Frequency Provider Last Rate Last Admin   acetaminophen  (TYLENOL ) tablet 650 mg  650 mg Oral Q6H PRN [name], NP       alum & mag hydroxide-simeth (MAALOX/MYLANTA) 200-200-20 MG/5ML suspension 30 mL  30 mL Oral Q4H PRN [name], NP       benztropine  (COGENTIN ) tablet 0.5 mg  0.5 mg Oral BID [name], NP       cephALEXin  (KEFLEX ) capsule 500 mg  500 mg Oral Q8H Nwoko, Agnes I, NP       divalproex  (DEPAKOTE ) DR tablet 500 mg  500 mg Oral Q12H [name], NP   500 mg at 08/25/23 4098   fluPHENAZine  (PROLIXIN ) tablet 2.5 mg  2.5 mg Oral BID Pashayan, Alexander S, MD   2.5 mg at 08/25/23 1191   Or   fluPHENAZine  (PROLIXIN ) injection 2.5 mg  2.5 mg Intramuscular BID Basilia Bosworth, MD   2.5 mg at 08/23/23 4782   hydrOXYzine  (ATARAX ) tablet 25 mg  25 mg Oral TID PRN [name], NP       LORazepam  (ATIVAN ) tablet 1 mg  1 mg Oral Q6H PRN [name], NP       magnesium  hydroxide (MILK OF MAGNESIA) suspension 30 mL  30 mL Oral Daily PRN [name], NP       nicotine  (NICODERM CQ  - dosed in mg/24 hours) patch 14 mg  14 mg Transdermal Daily Ntuen, Tina C, FNP       OLANZapine  (ZYPREXA ) injection 10 mg  10 mg Intramuscular TID PRN [name], NP   10 mg at 08/23/23 1015   OLANZapine  (ZYPREXA ) injection 5 mg  5 mg Intramuscular TID PRN [name], NP       OLANZapine  zydis (ZYPREXA ) disintegrating tablet 5 mg  5 mg Oral TID PRN [name], NP       propranolol  (INDERAL ) tablet 5 mg  5 mg Oral Q12H [name], NP       silver   sulfADIAZINE  (SILVADENE ) 1 % cream   Topical Daily Nwoko, Agnes I, NP   1 Application at 08/24/23 1201   traZODone  (DESYREL ) tablet 50 mg  50 mg Oral QHS PRN [name], NP       PTA Medications: Medications Prior to Admission  Medication Sig Dispense Refill Last Dose/Taking   divalproex  (DEPAKOTE ) 500 MG DR tablet Take 1 tablet (500 mg total) by mouth every 12 (twelve) hours. For mood stabilization. 60 tablet 0 08/20/2023 Morning   benztropine  (COGENTIN ) 0.5 MG tablet Take 1 tablet (0.5 mg total) by mouth 2 (two) times daily. For prevention of drug induced tremors. (Patient not taking: Reported on 08/20/2023) 60 tablet 30 Not Taking   fluPHENAZine  (PROLIXIN ) 5 MG tablet Take 1 tablet (5 mg total) by mouth every 12 (twelve) hours. For mood control (Patient not taking: Reported on 08/20/2023) 60 tablet 0 Not Taking   hydrOXYzine  (ATARAX ) 25 MG tablet Take 1 tablet (25 mg total) by mouth 3 (three) times daily as needed for anxiety. (Patient not taking: Reported on 08/20/2023) 90 tablet 0 Not Taking   nicotine  (NICODERM CQ  - DOSED IN MG/24 HOURS) 21 mg/24hr patch Place 1 patch (21 mg total) onto the skin  daily. (May buy from over the counter): Foe smoking cessation. (Patient not taking: Reported on 08/20/2023)   Not Taking   nicotine  polacrilex (NICORETTE ) 2 MG gum Take 1 each (2 mg total) by mouth as needed. (May buy from over the counter: For smoking cessation. (Patient not taking: Reported on 08/20/2023)   Not Taking   propranolol  (INDERAL ) 10 MG tablet Take 0.5 tablets (5 mg total) by mouth every 12 (twelve) hours. For anxiety (Patient not taking: Reported on 08/20/2023) 60 tablet 0 Not Taking   traZODone  (DESYREL ) 50 MG tablet Take 1 tablet (50 mg total) by mouth at bedtime as needed for sleep. (Patient not taking: Reported on 08/20/2023) 30 tablet 0 Not Taking    Patient Stressors: Marital or family conflict   Medication change or noncompliance    Patient Strengths: Physical Health  Religious  Affiliation  Supportive family/friends   Treatment Modalities: Medication Management, Group therapy, Case management,  1 to 1 session with clinician, Psychoeducation, Recreational therapy.   Physician Treatment Plan for Primary Diagnosis: Disorganized schizophrenia (HCC) Long Term Goal(s):     Short Term Goals:    Medication Management: Evaluate patient's response, side effects, and tolerance of medication regimen.  Therapeutic Interventions: 1 to 1 sessions, Unit Group sessions and Medication administration.  Evaluation of Outcomes: Progressing  Physician Treatment Plan for Secondary Diagnosis: Principal Problem:   Disorganized schizophrenia (HCC) Active Problems:   Schizophrenia (HCC)  Long Term Goal(s):     Short Term Goals:       Medication Management: Evaluate patient's response, side effects, and tolerance of medication regimen.  Therapeutic Interventions: 1 to 1 sessions, Unit Group sessions and Medication administration.  Evaluation of Outcomes: Progressing   RN Treatment Plan for Primary Diagnosis: Disorganized schizophrenia (HCC) Long Term Goal(s): Knowledge of disease and therapeutic regimen to maintain health will improve  Short Term Goals: Ability to remain free from injury will improve, Ability to demonstrate self-control, Ability to participate in decision making will improve, Ability to disclose and discuss suicidal ideas, and Ability to identify and develop effective coping behaviors will improve  Medication Management: RN will administer medications as ordered by provider, will assess and evaluate patient's response and provide education to patient for prescribed medication. RN will report any adverse and/or side effects to prescribing provider.  Therapeutic Interventions: 1 on 1 counseling sessions, Psychoeducation, Medication administration, Evaluate responses to treatment, Monitor vital signs and CBGs as ordered, Perform/monitor CIWA, COWS, AIMS and Fall  Risk screenings as ordered, Perform wound care treatments as ordered.  Evaluation of Outcomes: Progressing   LCSW Treatment Plan for Primary Diagnosis: Disorganized schizophrenia (HCC) Long Term Goal(s): Safe transition to appropriate next level of care at discharge, Engage patient in therapeutic group addressing interpersonal concerns.  Short Term Goals: Engage patient in aftercare planning with referrals and resources, Increase emotional regulation, Facilitate patient progression through stages of change regarding substance use diagnoses and concerns, and Identify triggers associated with mental health/substance abuse issues  Therapeutic Interventions: Assess for all discharge needs, 1 to 1 time with Social worker, Explore available resources and support systems, Assess for adequacy in community support network, Educate family and significant other(s) on suicide prevention, Complete Psychosocial Assessment, Interpersonal group therapy.  Evaluation of Outcomes: Progressing   Progress in Treatment: Attending groups: Yes. Participating in groups: Yes. Taking medication as prescribed: Some Toleration medication: Yes. Family/Significant other contact made: No, will contact:  Magda Schneider (father) [phone] Patient understands diagnosis: No. Discussing patient identified problems/goals with staff: Yes. Medical problems  stabilized or resolved: Yes. Denies suicidal/homicidal ideation: Yes. Issues/concerns per patient self-inventory: No.   New problem(s) identified: No, Describe:  none   New Short Term/Long Term Goal(s): medication stabilization, elimination of SI thoughts, development of comprehensive mental wellness plan.      Patient Goals:  "I am only taking Depakote "    Discharge Plan or Barriers: Patient recently admitted. CSW will continue to follow and assess for appropriate referrals and possible discharge planning.      Reason for Continuation of Hospitalization: Delusions   Hallucinations Medication stabilization   Estimated Length of Stay: 5-7 days (~5/4)  Last 3 [place] Suicide Severity Risk Score: Flowsheet Row Ad[facility] (Current) [facility] from [facility]18[facility]23 [facility] ED from 06/09/2023 in [facility] Emergency Department at [facility]  C-SSRS RISK CATEGORY No Risk No Risk No Risk       Last [facility] 2/9 Scores:    07/11/2022   11:18 AM 06/27/2022    2:09 PM 02/18/2022   12:20 PM  Depression screen PHQ 2/9  Decreased Interest 0 1 0  Down, Depressed, Hopeless 1 0 0  PHQ - 2 Score 1 1 0  Altered sleeping 3 2 0  Tired, decreased energy 2 2 0  Change in appetite 1 1 0  Feeling bad or failure about yourself  0 0 0  Trouble concentrating 3 1 0  Moving slowly or fidgety/restless 3 2 0  Suicidal thoughts 0 0 0  PHQ-9 Score 13 9 0  Difficult doing work/chores   Not difficult at all    Scribe for Treatment Team: Vonzell Guerin, LCSWA 08/25/2023 12:56 PM

## 2023-08-25 NOTE — Group Note (Signed)
 Recreation Therapy Group Note   Group Topic:Healthy Decision Making  Group Date: 08/25/2023 Start Time: 1020 End Time: 1050 Facilitators: Shondrika Hoque-McCall, LRT,CTRS Location: [address] Dayroom   Group Topic: Decision Making, Problem Solving, Communication  Goal Area(s) Addresses:  Patient will effectively work with peer towards shared goal.  Patient will identify factors that guided their decision making.  Patient will pro-socially communicate ideas during group session.   Intervention: Survival Scenario - pencil, paper  Activity: Patients were given a scenario that they were going to be stranded on a deserted [place] for several months before being rescued. Writer tasked them with making a list of 15 things they would choose to bring with them for "survival". The list of items was prioritized most important to least. Each patient would come up with their own list, then work together to create a new list of 15 items while in a group of 3-5 peers. LRT discussed each person's list and how it differed from others. The debrief included discussion of priorities, good decisions versus bad decisions, and how it is important to think before acting so we can make the best decision possible. LRT tied the concept of effective communication among group members to patient's support systems outside of the hospital and its benefit post discharge.  Education: Pharmacist, community, Priorities, Support System, Discharge Planning   Education Outcome: Acknowledges education/In group clarification/Needs additional education   Affect/Mood: N/A   Participation Level: Did not attend    Clinical Observations/Individualized Feedback:     Plan: Continue to engage patient in RT group sessions 2-3x/week.   Jilliane Kazanjian-McCall, LRT,CTRS 08/25/2023 1:28 PM

## 2023-08-26 DIAGNOSIS — F201 Disorganized schizophrenia: Secondary | ICD-10-CM | POA: Diagnosis not present

## 2023-08-26 LAB — RPR: RPR Ser Ql: NONREACTIVE

## 2023-08-26 LAB — HIV ANTIBODY (ROUTINE TESTING W REFLEX): HIV Screen 4th Generation wRfx: NONREACTIVE

## 2023-08-26 MED ORDER — SODIUM CHLORIDE 0.9 % IN NEBU
INHALATION_SOLUTION | RESPIRATORY_TRACT | Status: AC
  Filled 2023-08-26: qty 9

## 2023-08-26 MED ORDER — FLUPHENAZINE HCL 2.5 MG PO TABS
2.5000 mg | ORAL_TABLET | Freq: Three times a day (TID) | ORAL | Status: DC
  Administered 2023-08-26 – 2023-08-30 (×12): 2.5 mg via ORAL
  Filled 2023-08-26 (×15): qty 1

## 2023-08-26 MED ORDER — FLUPHENAZINE HCL 2.5 MG/ML IJ SOLN
2.5000 mg | Freq: Three times a day (TID) | INTRAMUSCULAR | Status: DC
  Filled 2023-08-26 (×15): qty 1
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
## 2023-08-26 NOTE — [facility] Group Notes (Signed)
 Adult Psychoeducational Group Note  Date:  08/26/2023 Time:  10:17 AM  Group Topic/Focus:  Goals Group:   The focus of this group is to help patients establish daily goals to achieve during treatment and discuss how the patient can incorporate goal setting into their daily lives to aide in recovery.  Participation Level:  Did Not Attend  Participation Quality:    Affect:    Cognitive:    Insight:   Engagement in Group:    Modes of Intervention:    Additional Comments:    Brendan Hogan 08/26/2023, 10:17 AM

## 2023-08-26 NOTE — Progress Notes (Signed)
   08/26/23 0845  Psych Admission Type (Psych Patients Only)  Admission Status Involuntary  Psychosocial Assessment  Patient Complaints Irritability  Eye Contact Fair  Facial Expression Animated  Affect Labile;Preoccupied  Speech Pressured  Interaction Childlike;Attention-seeking  Motor Activity Fidgety  Appearance/Hygiene Disheveled  Behavior Characteristics Restless  Mood Labile  Thought Process  Coherency Disorganized  Content Preoccupation  Delusions Paranoid  Perception WDL  Hallucination None reported or observed  Judgment Poor  Confusion None  Danger to Self  Current suicidal ideation? Denies  Agreement Not to Harm Self Yes  Description of Agreement Verbal  Danger to Others  Danger to Others None reported or observed

## 2023-08-26 NOTE — Group Note (Signed)
 Date:  08/26/2023 Time:  8:51 PM  Group Topic/Focus:  Wrap-Up Group:   The focus of this group is to help patients review their daily goal of treatment and discuss progress on daily workbooks.    Participation Level:  Active  Participation Quality:  Appropriate  Affect:  Appropriate  Cognitive:  Appropriate  Insight: Appropriate  Engagement in Group:  Engaged  Modes of Intervention:  Education and Exploration  Additional Comments:  Patient attended and participated in group tonight.  He reports that he love sports. Love women, love his family,  love good food, love money and love entertainment.  Eugena Herter Dacosta 08/26/2023, 8:51 PM

## 2023-08-26 NOTE — Plan of Care (Signed)
   Problem: Education: Goal: Emotional status will improve Outcome: Not Progressing Goal: Mental status will improve Outcome: Not Progressing Goal: Verbalization of understanding the information provided will improve Outcome: Not Progressing

## 2023-08-26 NOTE — Group Note (Signed)
 Recreation Therapy Group Note   Group Topic:General Recreation  Group Date: 08/26/2023 Start Time: 1011 End Time: 1045 Facilitators: Cheresa Siers-McCall, LRT,CTRS Location: [address]   Group Topic/Focus: General Recreation   Goal Area(s) Addresses:  Patient will use appropriate interactions in play with peers.   Patient will exhibit appropriate behavior with peers during group.  Intervention: [employer], Music  Activity: LRT offered patients a variety of activities they could choose to participate in. Patients were engage appropriately and respectful with peers during group session.     Education: Ability to engage with others/Communication/Team Building   Affect/Mood: N/A   Participation Level: Did not attend    Clinical Observations/Individualized Feedback:     Plan: Continue to engage patient in RT group sessions 2-3x/week.   Jahara Dail-McCall, LRT,CTRS 08/26/2023 12:28 PM

## 2023-08-27 DIAGNOSIS — F201 Disorganized schizophrenia: Secondary | ICD-10-CM | POA: Diagnosis not present

## 2023-08-27 MED ORDER — ENSURE ENLIVE PO LIQD
237.0000 mL | Freq: Two times a day (BID) | ORAL | Status: DC
  Administered 2023-08-27 – 2023-09-06 (×16): 237 mL via ORAL
  Filled 2023-08-27 (×23): qty 237

## 2023-08-27 MED ORDER — SODIUM CHLORIDE 0.9 % IN NEBU
INHALATION_SOLUTION | RESPIRATORY_TRACT | Status: AC
  Administered 2023-08-27: 5 mL
  Filled 2023-08-27: qty 6

## 2023-08-27 NOTE — Progress Notes (Signed)
   08/26/23 2314  Psych Admission Type (Psych Patients Only)  Admission Status Involuntary  Psychosocial Assessment  Patient Complaints None  Eye Contact Fair  Facial Expression Animated  Affect Preoccupied  Speech Pressured  Interaction Childlike  Motor Activity Fidgety  Appearance/Hygiene Disheveled  Behavior Characteristics Restless  Mood Pleasant  Thought Process  Coherency Disorganized  Content Preoccupation  Delusions Paranoid  Perception WDL  Hallucination None reported or observed  Judgment Poor  Confusion None  Danger to Self  Current suicidal ideation? Denies  Agreement Not to Harm Self Yes  Description of Agreement verbal  Danger to Others  Danger to Others None reported or observed

## 2023-08-27 NOTE — Group Note (Signed)
 Recreation Therapy Group Note   Group Topic:Self-Esteem  Group Date: 08/27/2023 Start Time: 1016 End Time: 1050 Facilitators: Emely Fahy-McCall, LRT,CTRS Location: [address]   Group Topic: Self-esteem  Goal Area(s) Addresses:  Patient will identify and write at least one positive trait about themself. Patient will acknowledge the benefit of healthy self-esteem. Patient will endorse understanding of ways to increase self-esteem.   Intervention: Personalized Plate- printed license plate template, markers or colored pencils   Activity: LRT began group session with open dialogue asking the patients to define self-esteem and verbally identify positive qualities and traits people may possess. Patients were then instructed to design a personalized license plate, with words and drawings, representing at least 3 positive things about themselves. Pts were encouraged to include favorites, things they are proud of, what they enjoy doing, and dreams for their future. If a patient had a life motto or a meaningful phase that expressed their life values, pt's were asked to incorporate that into their design as well. Patients were given the opportunity to share their completed work with the group.   Education: Healthy self-esteem, Positive character traits, Accepting compliments, Leisure as competence and coping, Support Systems, Discharge planning LRT educated patients on the importance of healthy self-esteem and ways to build self-esteem. LRT addressed discharge planning reviewing positive coping skills and healthy support systems.  Education Outcome: Acknowledges education/In group clarification offered   Affect/Mood: N/A   Participation Level: Did not attend    Clinical Observations/Individualized Feedback:     Plan: Continue to engage patient in RT group sessions 2-3x/week.   Maela Takeda-McCall, LRT,CTRS 08/27/2023 12:45 PM

## 2023-08-27 NOTE — Progress Notes (Signed)
   08/27/23 0800  Psych Admission Type (Psych Patients Only)  Admission Status Involuntary  Psychosocial Assessment  Patient Complaints None  Eye Contact Fair  Facial Expression Animated  Affect Preoccupied;Labile  Speech Logical/coherent;Pressured  Interaction Childlike;Defensive;Dominating  Motor Activity Restless;Fidgety  Appearance/Hygiene Disheveled  Behavior Characteristics Restless  Mood Pleasant  Thought Process  Coherency WDL  Content Preoccupation  Delusions Paranoid  Perception WDL  Hallucination None reported or observed  Judgment Poor  Confusion None  Danger to Self  Current suicidal ideation? Denies  Agreement Not to Harm Self Yes  Description of Agreement verbal  Danger to Others  Danger to Others None reported or observed

## 2023-08-27 NOTE — Progress Notes (Deleted)
   08/27/23 0800  Psych Admission Type (Psych Patients Only)  Admission Status Involuntary  Psychosocial Assessment  Patient Complaints None  Eye Contact Fair  Facial Expression Animated  Affect Preoccupied;Labile  Speech Logical/coherent;Pressured  Interaction Childlike;Defensive;Dominating  Motor Activity Restless;Fidgety  Appearance/Hygiene Disheveled  Behavior Characteristics Restless  Mood Pleasant  Thought Process  Coherency Disorganized  Content Preoccupation  Delusions Paranoid  Perception WDL  Hallucination None reported or observed  Judgment Poor  Confusion None  Danger to Self  Current suicidal ideation? Denies  Agreement Not to Harm Self Yes  Description of Agreement verbal  Danger to Others  Danger to Others None reported or observed

## 2023-08-27 NOTE — Plan of Care (Signed)
  Problem: Education: Goal: Emotional status will improve Outcome: Progressing Goal: Verbalization of understanding the information provided will improve Outcome: Progressing   Problem: Activity: Goal: Sleeping patterns will improve Outcome: Progressing   Problem: Coping: Goal: Ability to demonstrate self-control will improve Outcome: Progressing   Problem: Health Behavior/Discharge Planning: Goal: Compliance with treatment plan for underlying cause of condition will improve Outcome: Progressing

## 2023-08-27 NOTE — [facility] Group Notes (Signed)
 Adult Psychoeducational Group Note  Date:  08/27/2023 Time:  11:29 AM  Group Topic/Focus:  Goals Group:   The focus of this group is to help patients establish daily goals to achieve during treatment and discuss how the patient can incorporate goal setting into their daily lives to aide in recovery. Orientation:   The focus of this group is to educate the patient on the purpose and policies of crisis stabilization and provide a format to answer questions about their admission.  The group details unit policies and expectations of patients while admitted.  Participation Level:  Did Not Attend  Participation Quality:    Affect:    Cognitive:    Insight:   Engagement in Group:    Modes of Intervention:    Additional Comments:    Ancel Baltimore 08/27/2023, 11:29 AM

## 2023-08-27 NOTE — Plan of Care (Signed)

## 2023-08-27 NOTE — Progress Notes (Signed)
 [facility] MD Progress Note  08/27/2023 10:47 AM Brendan Hogan  MRN:  [id number] Subjective:   Brendan Hogan is a 28 yr old male who presented on 4/21 to [facility] under IVC by [name] due to threatening to cut his neck with a knife and threatening others, he was admitted to [facility] on 4/23.  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  He does have a Legal Guardian, Brendan Hogan, his father.  Case was discussed in the multidisciplinary team. MAR was reviewed and patient required encouragement to take antibiotic as well as Prolixin .  Forced meds continue order being placed on 4-26.  Today's assessment notes: During assessment today, patient was seen and examined in his room lying down on his bed.  He appears alert, cooperative, pleasant and oriented to person, time, place, and situation.  Able to carry out meaningful conversation with this provider during assessment.  Patient reports that he is fine obviously that last night, and thought that he is improving.  Added that the visit with the dad went well.  Chart reviewed and findings shared with the treatment team and consult with attending psychiatrist Dr. Gleen Lance. Brendan Hogan is compliant with his medication regimen for his mood stability and psychosis. Dressing changes to right hand burn areas daily with application of Silvadene  1% cream.  Continued on Keflex  antibiotic treatment for wound areas to right hand.  Patient delusion has greatly improved as he is more coherent and communicating the staff and other patients.  No agitation protocol or as needed required.  Reports his sleep was good and slept over 6.75 hours last night reports anxiety is at manageable level and rated as #0/10, with 10 being high severity.  Report depression is rated as 0/10. He denies suicidal ideation, homicidal ideation or AVH. This provider  reinforce teaching and taking his psychotropic medications and antibiotic as ordered to facilitate healing.  He continues on Prolixin  2.5 mg p.o. 3 times daily, without complaint of side effect.  No TD/EPS/Joint rigidity. We will continue to monitor his psychotic symptoms for improvement.   Principal Problem: Disorganized schizophrenia (HCC) Diagnosis: Principal Problem:   Disorganized schizophrenia (HCC) Active Problems:   Schizophrenia (HCC)  Total Time spent with patient: 45 minutes  Past Psychiatric History:  Disorganized Schizophrenia, Polysubstance Abuse, and medication noncompliance, 1 Suicide Attempt, and Multiple Psychiatric Hospitalizations (Last- [facility]).  He does have a Legal Guardian, Brendan Hogan, his father.  Past Medical History:  Past Medical History:  Diagnosis Date   Disorganized schizophrenia (HCC)    Primary insomnia    Psychosis (HCC) 05/2019   [facility] [place]    Seasonal allergies     Past Surgical History:  Procedure Laterality Date   TONSILLECTOMY     Family History:  Family History  Problem Relation Age of Onset   Hypertension Mother    Family Psychiatric  History:  None Reported.  Social History:  Social History   Substance and Sexual Activity  Alcohol Use Not Currently   Comment: rare     Social History   Substance and Sexual Activity  Drug Use No    Social History   Socioeconomic History   Marital status: Single    Spouse name: Not on file   Number of children: Not on file   Years of education: Not on file   Highest education level: Not on file  Occupational History   Not  on file  Tobacco Use   Smoking status: Every Day   Smokeless tobacco: Former  Building services engineer status: Never Used  Substance and Sexual Activity   Alcohol use: Not Currently    Comment: rare   Drug use: No   Sexual activity: Not Currently  Other Topics Concern   Not on file  Social History Narrative   ** Merged History Encounter  **       Social Drivers of Health   Financial Resource Strain: Not on file  Food Insecurity: Patient Declined (08/19/2023)   Hunger Vital Sign    Worried About Running Out of Food in the Last Year: Patient declined    Ran Out of Food in the Last Year: Patient declined  Transportation Needs: Patient Declined (08/19/2023)   PRAPARE - Administrator, Civil Service (Medical): Patient declined    Lack of Transportation (Non-Medical): Patient declined  Physical Activity: Not on file  Stress: Not on file  Social Connections: Not on file   Additional Social History:    Sleep: Good  Appetite:  Good  Current Medications: Current Facility-Administered Medications  Medication Dose Route Frequency Provider Last Rate Last Admin   acetaminophen  (TYLENOL ) tablet 650 mg  650 mg Oral Q6H PRN [name], NP   650 mg at 08/26/23 2135   alum & mag hydroxide-simeth (MAALOX/MYLANTA) 200-200-20 MG/5ML suspension 30 mL  30 mL Oral Q4H PRN [name], NP       benztropine  (COGENTIN ) tablet 0.5 mg  0.5 mg Oral BID [name], NP   0.5 mg at 08/26/23 1706   cephALEXin  (KEFLEX ) capsule 500 mg  500 mg Oral Q8H Nwoko, Agnes I, NP   500 mg at 08/27/23 0759   divalproex  (DEPAKOTE ) DR tablet 500 mg  500 mg Oral Q12H [name], NP   500 mg at 08/27/23 0800   fluPHENAZine  (PROLIXIN ) tablet 2.5 mg  2.5 mg Oral TID Timmothy Foots, MD   2.5 mg at 08/27/23 1610   Or   fluPHENAZine  (PROLIXIN ) injection 2.5 mg  2.5 mg Intramuscular TID Timmothy Foots, MD       hydrOXYzine  (ATARAX ) tablet 25 mg  25 mg Oral TID PRN [name], NP       LORazepam  (ATIVAN ) tablet 1 mg  1 mg Oral Q6H PRN [name], NP       magnesium  hydroxide (MILK OF MAGNESIA) suspension 30 mL  30 mL Oral Daily PRN [name], NP       nicotine  (NICODERM CQ  - dosed in mg/24 hours) patch 14 mg  14 mg Transdermal Daily Anber Mckiver C, FNP       OLANZapine  (ZYPREXA ) injection 10 mg  10 mg Intramuscular  TID PRN [name], NP   10 mg at 08/23/23 1015   OLANZapine  (ZYPREXA ) injection 5 mg  5 mg Intramuscular TID PRN [name], NP       OLANZapine  zydis (ZYPREXA ) disintegrating tablet 5 mg  5 mg Oral TID PRN [name], NP       propranolol  (INDERAL ) tablet 5 mg  5 mg Oral Q12H [name], NP       silver  sulfADIAZINE  (SILVADENE ) 1 % cream   Topical Daily Nwoko, Agnes I, NP   Given at 08/26/23 1441   traZODone  (DESYREL ) tablet 50 mg  50 mg Oral QHS PRN [name], NP        Lab Results:  Results for orders placed or performed during the hospital encounter  of 08/19/23 (from the past 48 hours)  Folate     Status: None   Collection Time: 08/25/23  7:32 PM  Result Value Ref Range   Folate 16.1 >5.9 ng/mL    Comment: Performed at [facility], [address], [place], [place] [address]  RPR     Status: None   Collection Time: 08/25/23  7:32 PM  Result Value Ref Range   RPR Ser Ql NON REACTIVE NON REACTIVE    Comment: Performed at [facility], [address] [place], [place] [address]  Vitamin B12     Status: None   Collection Time: 08/25/23  7:32 PM  Result Value Ref Range   Vitamin B-12 256 180 - 914 pg/mL    Comment: (NOTE) This assay is not validated for testing neonatal or myeloproliferative syndrome specimens for Vitamin B12 levels. Performed at [facility], [address] [place], [place] [address]   VITAMIN D 25 Hydroxy (Vit-D Deficiency, Fractures)     Status: None   Collection Time: 08/25/23  7:32 PM  Result Value Ref Range   Vit D, 25-Hydroxy 34.85 30 - 100 ng/mL    Comment: (NOTE) Vitamin D deficiency has been defined by the Institute of Medicine  and an Endocrine Society practice guideline as a level of serum 25-OH  vitamin D less than 20 ng/mL (1,2). The Endocrine Society went on to  further define vitamin D insufficiency as a level between 21 and 29  ng/mL (2).  1. IOM (Institute of  Medicine). 2010. Dietary reference intakes for  calcium and D. [place]: [employer]. 2. Holick MF, Binkley Everton, Bischoff-Ferrari HA, et al. Evaluation,  treatment, and prevention of vitamin D deficiency: an Endocrine  Society clinical practice guideline, JCEM. 2011 Jul; 96(7): 1911-30.  Performed at [facility], [address] [place], [place] [address]   HIV Antibody (routine testing w rflx)     Status: None   Collection Time: 08/25/23  7:32 PM  Result Value Ref Range   HIV Screen 4th Generation wRfx Non Reactive Non Reactive    Comment: Performed at [facility], [address], [place], [place] [address]   Blood Alcohol level:  Lab Results  Component Value Date   ETH <10 08/18/2023   ETH <10 06/09/2023   Metabolic Disorder Labs: Lab Results  Component Value Date   HGBA1C 4.8 08/21/2023   MPG 91.06 08/21/2023   MPG 105 12/09/2022   Lab Results  Component Value Date   PROLACTIN 26.5 (H) 09/08/2019   Lab Results  Component Value Date   CHOL 131 08/21/2023   TRIG 53 08/21/2023   HDL 43 08/21/2023   CHOLHDL 3.0 08/21/2023   VLDL 11 08/21/2023   LDLCALC 77 08/21/2023   LDLCALC 91 12/09/2022    Physical Findings: AIMS:  , ,  ,  ,    CIWA:    COWS:     Musculoskeletal: Strength & Muscle Tone: within normal limits Gait & Station: normal Patient leans: N/A  Psychiatric Specialty Exam:  Presentation  General Appearance:  Appropriate for Environment; Casual  Eye Contact: Good  Speech: Clear and Coherent  Speech Volume: Normal  Handedness: Right  Mood and Affect  Mood: Euthymic  Affect: Appropriate; Congruent  Thought Process  Thought Processes: Coherent; Linear  Descriptions of Associations:Intact  Orientation:Full (Time, Place and Person)  Thought Content:Logical  History of Schizophrenia/Schizoaffective disorder:Yes  Duration of Psychotic Symptoms:Greater than six  months  Hallucinations:Hallucinations: None   Ideas of Reference:None  Suicidal Thoughts:Suicidal Thoughts: No   Homicidal Thoughts:Homicidal Thoughts: No  Sensorium  Memory: Immediate Fair; Recent Fair  Judgment: Fair  Insight: Shallow  Executive Functions  Concentration: Fair  Attention Span: Fair  Recall: Fair  Fund of Knowledge: Fair  Language: Fair  Psychomotor Activity  Psychomotor Activity:Psychomotor Activity: Normal  Assets  Assets: Communication Skills; Desire for Improvement; Physical Health; Resilience; Social Support  Sleep  Sleep:Sleep: Good Number of Hours of Sleep: 6.75  Physical Exam: Physical Exam Vitals and nursing note reviewed.  Constitutional:      General: He is not in acute distress.    Appearance: Normal appearance. He is normal weight. He is not ill-appearing or toxic-appearing.  HENT:     Head: Normocephalic and atraumatic.     Right Ear: External ear normal.     Left Ear: External ear normal.     Nose: Nose normal.     Mouth/Throat:     Mouth: Mucous membranes are moist.     Pharynx: Oropharynx is clear.  Eyes:     Extraocular Movements: Extraocular movements intact.  Cardiovascular:     Rate and Rhythm: Normal rate.     Pulses: Normal pulses.  Pulmonary:     Effort: Pulmonary effort is normal.  Abdominal:     Comments: Deferred  Genitourinary:    Comments: Deferred Musculoskeletal:        General: Normal range of motion.  Skin:    General: Skin is warm.  Neurological:     General: No focal deficit present.     Mental Status: He is alert.    Review of Systems  Constitutional:  Negative for chills and fever.  HENT:  Negative for sore throat.   Respiratory:  Negative for cough and shortness of breath.   Cardiovascular:  Negative for chest pain.  Gastrointestinal:  Negative for abdominal pain, constipation, diarrhea, nausea and vomiting.  Genitourinary:  Negative for dysuria and urgency.   Musculoskeletal:  Negative for myalgias.  Skin:  Negative for itching and rash.       Dressing to right hand burn area intact.  Patient continues on antibiotic treatment and Silvadene  1% ointment to area.  Neurological:  Negative for dizziness, weakness and headaches.  Psychiatric/Behavioral:  Positive for hallucinations (Reports None but visibly responding to internal stimuli). Negative for depression and suicidal ideas. The patient is not nervous/anxious.    Blood pressure 109/70, pulse 81, temperature 97.6 F (36.4 C), temperature source Oral, resp. rate 16, height 5\' 5"  (1.651 m), weight 56.1 kg, SpO2 100%. Body mass index is 20.57 kg/m.   Treatment Plan Summary: Daily contact with patient to assess and evaluate symptoms and progress in treatment and Medication management  Brendan Hogan is a 28 yr old male who presented on 4/21 to [facility] under IVC by [name] due to threatening to cut his neck with a knife and threatening others, he was admitted to [facility] on 4/23.  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  He does have a Legal Guardian, Brendan Hogan, his father.  Jaquil is compliant with his medication regimen for his mood stability and psychosis.  Dressing changes to right hand burn areas daily with application of Silvadene  1% cream.  Continued on Keflex  antibiotic treatment for wound areas to right hand.  Patient delusion has greatly improved as she is more coherent and communicating the  staff and other patients.  No agitation protocol or as needed required.  Paranoid Schizophrenia: -Increase Prolixin  2.5 mg PO or 2.5 mg IM from BID to 3 times daily for psychosis and mood stability -Forced Medication Order Placed 4/26 -Continue Depakote  500 mg q8 for mood stability -Continue Propanolol 5 mg BID for anxiety -Continue Agitation Protocol:  Zyprexa /Ativan  PO or Zyprexa  IM  Hand Burn: -Continue Keflex  500 mg q8 -Continue Silvadene  1% cream q12  Nicotine  Dependence: -Continue Nicotine  Patch 14 mg daily  -Continue PRN's: Tylenol , Maalox, Atarax , Milk of Magnesia, Trazodone   Laurence Pons, FNP 08/27/2023, 10:47 AM Patient ID: Baldwin Bones, male   DOB: 02/08/1996, 28 y.o.   MRN: [id number] Patient ID: MARGIE WOLD, male   DOB: 1996-03-20, 28 y.o.   MRN: [id number]

## 2023-08-27 NOTE — [facility] Group Notes (Signed)
 [facility] Group Notes:  (Nursing/MHT/Case Management/Adjunct)  Date:  08/27/2023  Time:  2000  Type of Therapy:   Wrap up group  Participation Level:  Active  Participation Quality:  Appropriate and Attentive  Affect:  Appropriate  Cognitive:  Alert and Appropriate  Insight:  Appropriate and Good  Engagement in Group:  Engaged  Modes of Intervention:  Discussion  Summary of Progress/Problems:  Tita Form 08/27/2023, 9:02 PM

## 2023-08-28 DIAGNOSIS — F201 Disorganized schizophrenia: Secondary | ICD-10-CM | POA: Diagnosis not present

## 2023-08-28 MED ORDER — SODIUM CHLORIDE 0.9 % IN NEBU
INHALATION_SOLUTION | RESPIRATORY_TRACT | Status: AC
  Filled 2023-08-28: qty 6

## 2023-08-28 MED ORDER — SODIUM CHLORIDE 0.9 % IN NEBU
INHALATION_SOLUTION | RESPIRATORY_TRACT | Status: AC
  Administered 2023-08-28: 5 mL
  Filled 2023-08-28: qty 9

## 2023-08-28 NOTE — Progress Notes (Signed)
 [facility] MD Progress Note  08/28/2023 12:25 PM Brendan Hogan  MRN:  [id number] Subjective:   Brendan Hogan is a 28 yr old male who presented on 4/21 to [facility] under IVC by [name] due to threatening to cut his neck with a knife and threatening others, he was admitted to [facility] on 4/23.  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  He does have a Legal Guardian, Bradrick Lafrenier, his father.  Case was discussed in the multidisciplinary team. MAR was reviewed and patient required encouragement to take antibiotic as well as Prolixin .  Forced meds continue order being placed on 4-26.  Today's assessment notes: Patient presents alert, calm, and oriented to person, time, place, and situation.  He reports his mood is less depressed and rates depression as number is 0/10, with 10 being high severity.  Speech is coherent with normal volume and pattern.  Patient able to answer assessment questions appropriately.  Chart reviewed and findings shared with the treatment team and consult with attending psychiatrist Dr. Gleen Lance, with no recommendation for medication changes today. He continues on Prolixin  2.5 mg p.o. 3 times daily, without complaint of side effect.  No TD/EPS/Joint rigidity. We will continue to monitor his psychotic symptoms for improvement.  Hygiene is fair. Dressing intact to right hand and requiring with Tylenol  prn for hand pain. Brendan Hogan is compliant with his medication regimen for his mood stability and psychosis. No agitation protocol or as needed required.  Reports his sleep was good and slept over 7 hours last night. Reports anxiety is at manageable level and rated as #0/10, with 10 being high severity.  Report depression is rated as 0/10. He denies suicidal ideation, homicidal ideation or AVH. This provider reinforce teaching on taking his psychotropic  medications and antibiotic as ordered to facilitate healing.   Principal Problem: Disorganized schizophrenia (HCC) Diagnosis: Principal Problem:   Disorganized schizophrenia (HCC) Active Problems:   Schizophrenia (HCC)  Total Time spent with patient: 45 minutes  Past Psychiatric History:  Disorganized Schizophrenia, Polysubstance Abuse, and medication noncompliance, 1 Suicide Attempt, and Multiple Psychiatric Hospitalizations (Last- [facility]).  He does have a Legal Guardian, Ladale Rinner, his father.  Past Medical History:  Past Medical History:  Diagnosis Date   Disorganized schizophrenia (HCC)    Primary insomnia    Psychosis (HCC) 05/2019   [facility]    Seasonal allergies     Past Surgical History:  Procedure Laterality Date   TONSILLECTOMY     Family History:  Family History  Problem Relation Age of Onset   Hypertension Mother    Family Psychiatric  History:  None Reported.  Social History:  Social History   Substance and Sexual Activity  Alcohol Use Not Currently   Comment: rare     Social History   Substance and Sexual Activity  Drug Use No    Social History   Socioeconomic History   Marital status: Single    Spouse name: Not on file   Number of children: Not on file   Years of education: Not on file   Highest education level: Not on file  Occupational History   Not on file  Tobacco Use   Smoking status: Every Day   Smokeless tobacco: Former  Advertising account planner   Vaping status: Never Used  Substance and Sexual Activity   Alcohol use: Not Currently    Comment: rare  Drug use: No   Sexual activity: Not Currently  Other Topics Concern   Not on file  Social History Narrative   ** Merged History Encounter **       Social Drivers of Health   Financial Resource Strain: Not on file  Food Insecurity: Patient Declined (08/19/2023)   Hunger Vital Sign    Worried About Running Out of Food in the Last Year: Patient declined    Ran  Out of Food in the Last Year: Patient declined  Transportation Needs: Patient Declined (08/19/2023)   PRAPARE - Administrator, Civil Service (Medical): Patient declined    Lack of Transportation (Non-Medical): Patient declined  Physical Activity: Not on file  Stress: Not on file  Social Connections: Not on file   Additional Social History:    Sleep: Good  Appetite:  Good  Current Medications: Current Facility-Administered Medications  Medication Dose Route Frequency Provider Last Rate Last Admin   acetaminophen  (TYLENOL ) tablet 650 mg  650 mg Oral Q6H PRN [name], NP   650 mg at 08/27/23 1519   alum & mag hydroxide-simeth (MAALOX/MYLANTA) 200-200-20 MG/5ML suspension 30 mL  30 mL Oral Q4H PRN [name], NP       benztropine  (COGENTIN ) tablet 0.5 mg  0.5 mg Oral BID [name], NP   0.5 mg at 08/28/23 1610   cephALEXin  (KEFLEX ) capsule 500 mg  500 mg Oral Q8H Nwoko, Devra Fontana I, NP   500 mg at 08/28/23 9604   divalproex  (DEPAKOTE ) DR tablet 500 mg  500 mg Oral Q12H [name], NP   500 mg at 08/28/23 5409   feeding supplement (ENSURE ENLIVE / ENSURE PLUS) liquid 237 mL  237 mL Oral BID BM Robet Chiquito, NP   237 mL at 08/28/23 1048   fluPHENAZine  (PROLIXIN ) tablet 2.5 mg  2.5 mg Oral TID Timmothy Foots, MD   2.5 mg at 08/28/23 1159   Or   fluPHENAZine  (PROLIXIN ) injection 2.5 mg  2.5 mg Intramuscular TID Parker, Alvin S, MD       hydrOXYzine  (ATARAX ) tablet 25 mg  25 mg Oral TID PRN [name], NP       LORazepam  (ATIVAN ) tablet 1 mg  1 mg Oral Q6H PRN [name], NP       magnesium  hydroxide (MILK OF MAGNESIA) suspension 30 mL  30 mL Oral Daily PRN [name], NP       nicotine  (NICODERM CQ  - dosed in mg/24 hours) patch 14 mg  14 mg Transdermal Daily Clarence Cogswell C, FNP       OLANZapine  (ZYPREXA ) injection 10 mg  10 mg Intramuscular TID PRN [name], NP   10 mg at 08/23/23 1015   OLANZapine  (ZYPREXA ) injection 5 mg  5 mg  Intramuscular TID PRN [name], NP       OLANZapine  zydis (ZYPREXA ) disintegrating tablet 5 mg  5 mg Oral TID PRN [name], NP       propranolol  (INDERAL ) tablet 5 mg  5 mg Oral Q12H [name], NP   5 mg at 08/28/23 8119   silver  sulfADIAZINE  (SILVADENE ) 1 % cream   Topical Daily Nwoko, Agnes I, NP   Given at 08/28/23 0807   traZODone  (DESYREL ) tablet 50 mg  50 mg Oral QHS PRN [name], NP        Lab Results:  No results found for this or any previous visit (from the past 48 hours).  Blood Alcohol level:  Lab Results  Component Value Date   ETH <10 08/18/2023   ETH <10 06/09/2023   Metabolic Disorder Labs: Lab Results  Component Value Date   HGBA1C 4.8 08/21/2023   MPG 91.06 08/21/2023   MPG 105 12/09/2022   Lab Results  Component Value Date   PROLACTIN 26.5 (H) 09/08/2019   Lab Results  Component Value Date   CHOL 131 08/21/2023   TRIG 53 08/21/2023   HDL 43 08/21/2023   CHOLHDL 3.0 08/21/2023   VLDL 11 08/21/2023   LDLCALC 77 08/21/2023   LDLCALC 91 12/09/2022    Physical Findings: AIMS:  , ,  ,  ,    CIWA:    COWS:     Musculoskeletal: Strength & Muscle Tone: within normal limits Gait & Station: normal Patient leans: N/A  Psychiatric Specialty Exam:  Presentation  General Appearance:  Appropriate for Environment; Casual  Eye Contact: Fair  Speech: Clear and Coherent  Speech Volume: Normal  Handedness: Right  Mood and Affect  Mood: Euthymic  Affect: Appropriate; Congruent  Thought Process  Thought Processes: Coherent  Descriptions of Associations:Intact  Orientation:Full (Time, Place and Person)  Thought Content:Logical  History of Schizophrenia/Schizoaffective disorder:Yes  Duration of Psychotic Symptoms:Greater than six months  Hallucinations:Hallucinations: None   Ideas of Reference:None  Suicidal Thoughts:Suicidal Thoughts: No   Homicidal Thoughts:Homicidal Thoughts: No  Sensorium   Memory: Immediate Fair; Recent Fair  Judgment: Fair  Insight: Shallow  Executive Functions  Concentration: Fair  Attention Span: Fair  Recall: Fair  Fund of Knowledge: Fair  Language: Fair  Psychomotor Activity  Psychomotor Activity:Psychomotor Activity: Normal  Assets  Assets: Communication Skills; Physical Health; Resilience  Sleep  Sleep:Sleep: Good Number of Hours of Sleep: 7  Physical Exam: Physical Exam Vitals and nursing note reviewed.  Constitutional:      General: He is not in acute distress.    Appearance: Normal appearance. He is normal weight. He is not ill-appearing or toxic-appearing.  HENT:     Head: Normocephalic and atraumatic.     Right Ear: External ear normal.     Left Ear: External ear normal.     Nose: Nose normal.     Mouth/Throat:     Mouth: Mucous membranes are moist.     Pharynx: Oropharynx is clear.  Eyes:     Extraocular Movements: Extraocular movements intact.  Cardiovascular:     Rate and Rhythm: Normal rate.     Pulses: Normal pulses.  Pulmonary:     Effort: Pulmonary effort is normal.  Abdominal:     Comments: Deferred  Genitourinary:    Comments: Deferred Musculoskeletal:        General: Normal range of motion.     Cervical back: Normal range of motion.  Skin:    General: Skin is warm.  Neurological:     General: No focal deficit present.     Mental Status: He is alert.  Psychiatric:        Mood and Affect: Mood normal.        Behavior: Behavior normal.   Review of Systems  Constitutional:  Negative for chills and fever.  HENT:  Negative for sore throat.   Respiratory:  Negative for cough and shortness of breath.   Cardiovascular:  Negative for chest pain.  Gastrointestinal:  Negative for abdominal pain, constipation, diarrhea, nausea and vomiting.  Genitourinary:  Negative for dysuria and urgency.  Musculoskeletal:  Negative for myalgias.  Skin:  Negative for itching and rash.  Dressing to right  hand burn area intact.  Patient continues on antibiotic treatment and Silvadene  1% ointment to area.  Neurological:  Negative for dizziness, weakness and headaches.  Psychiatric/Behavioral:  Positive for hallucinations (Reports None but visibly responding to internal stimuli). Negative for depression and suicidal ideas. The patient is not nervous/anxious.    Blood pressure 105/76, pulse 87, temperature (!) 97.4 F (36.3 C), temperature source Oral, resp. rate 16, height 5\' 5"  (1.651 m), weight 56.1 kg, SpO2 100%. Body mass index is 20.57 kg/m.   Treatment Plan Summary: Daily contact with patient to assess and evaluate symptoms and progress in treatment and Medication management  Brendan Hogan is a 28 yr old male who presented on 4/21 to [facility] under IVC by [name] due to threatening to cut his neck with a knife and threatening others, he was admitted to [facility] on 4/23.  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  He does have a Legal Guardian, Khris Frischman, his father.  Srikar is compliant with his medication regimen for his mood stability and psychosis.  Dressing changes to right hand burn areas daily with application of Silvadene  1% cream.  Continued on Keflex  antibiotic treatment for wound areas to right hand.  Patient delusion has greatly improved as she is more coherent and communicating the staff and other patients.  No agitation protocol or as needed required.  Paranoid Schizophrenia: -Continue Prolixin  2.5 mg PO 3 times daily for psychosis and mood stability -Forced Medication Order Placed 4/26 -Continue Depakote  500 mg q8 for mood stability -Continue Propanolol 5 mg BID for anxiety -Continue Agitation Protocol: Zyprexa /Ativan  PO or Zyprexa  IM  Hand Burn: -Continue Keflex  500 mg q8 -Continue Silvadene  1% cream q12  Nicotine   Dependence: -Continue Nicotine  Patch 14 mg daily  -Continue PRN's: Tylenol , Maalox, Atarax , Milk of Magnesia, Trazodone   Brendan Pons, FNP 08/28/2023, 12:25 PM Patient ID: Baldwin Bones, male   DOB: [birth year], 28 y.o.   MRN: [id number] Patient ID: KEONNE COPPING, male   DOB: Aug 13, 1995, 28 y.o.   MRN: [id number] Patient ID: NATHANEL MUBARAK, male   DOB: [birth year], 28 y.o.   MRN: [id number]

## 2023-08-28 NOTE — Progress Notes (Signed)
   08/28/23 0800  Psych Admission Type (Psych Patients Only)  Admission Status Involuntary  Psychosocial Assessment  Patient Complaints None  Eye Contact Fair  Facial Expression Animated  Affect Preoccupied  Speech Pressured  Interaction Childlike  Motor Activity Fidgety  Appearance/Hygiene Disheveled  Behavior Characteristics Fidgety;Cooperative  Mood Pleasant  Thought Process  Coherency WDL;Circumstantial  Content Preoccupation  Delusions Paranoid  Perception WDL  Hallucination None reported or observed  Judgment Poor  Confusion None  Danger to Self  Current suicidal ideation? Denies  Agreement Not to Harm Self Yes  Description of Agreement verbal  Danger to Others  Danger to Others None reported or observed

## 2023-08-28 NOTE — Group Note (Unsigned)
 Date:  08/28/2023 Time:  9:02 AM  Group Topic/Focus:  Goals Group:   The focus of this group is to help patients establish daily goals to achieve during treatment and discuss how the patient can incorporate goal setting into their daily lives to aide in recovery. Orientation:   The focus of this group is to educate the patient on the purpose and policies of crisis stabilization and provide a format to answer questions about their admission.  The group details unit policies and expectations of patients while admitted.     Participation Level:  {BHH PARTICIPATION WJXBJ:47829}  Participation Quality:  {BHH PARTICIPATION QUALITY:22265}  Affect:  {BHH AFFECT:22266}  Cognitive:  {BHH COGNITIVE:22267}  Insight: {BHH Insight2:20797}  Engagement in Group:  {BHH ENGAGEMENT IN FAOZH:08657}  Modes of Intervention:  {BHH MODES OF INTERVENTION:22269}  Additional Comments:  ***  Jaise Moser L Alvena Kiernan 08/28/2023, 9:02 AM

## 2023-08-28 NOTE — Progress Notes (Signed)
 Wound dressing change performed. Wound bed is pink with scant serous drainage. Moderate sloughing present. No purulent drainage noted. Wound was cleaned with 0.9% normal saline and coated with silver  sulfadiazine  1% cream. Non-adherent dressing was applied and wrapped with fabric tape. Patient tolerated well.

## 2023-08-28 NOTE — Group Note (Signed)
 Date:  08/28/2023 Time:  9:04 AM  Group Topic/Focus:  Goals Group:   The focus of this group is to help patients establish daily goals to achieve during treatment and discuss how the patient can incorporate goal setting into their daily lives to aide in recovery. Orientation:   The focus of this group is to educate the patient on the purpose and policies of crisis stabilization and provide a format to answer questions about their admission.  The group details unit policies and expectations of patients while admitted.    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    Brendan Hogan 08/28/2023, 9:04 AM

## 2023-08-28 NOTE — Group Note (Signed)
 Recreation Therapy Group Note   Group Topic:Leisure Education  Group Date: 08/28/2023 Start Time: 1012 End Time: 1035 Facilitators: Shonda Mandarino-McCall, LRT,CTRS Location: [address] Dayroom   Group Topic: Leisure Education  Goal Area(s) Addresses:  Patient will identify positive leisure activities for use post discharge. Patient will identify at least one positive benefit of participation in leisure activities.   Intervention: Innovation, Group Presentation   Activity: LRT had patients seated in a circle. Patients used a beach ball, to toss it back and forth as if playing volleyball. Patients were to keep the ball in play for as long as possible without the ball coming to a complete stop. LRT timed patients as they engaged in the activity. If at any time the ball came to a complete stop, the time would start over.   Education:  Leisure Scientist, physiological, Special educational needs teacher, Teamwork, Discharge Planning  Education Outcome: Acknowledges education/In group clarification offered/Needs additional education.    Affect/Mood: N/A   Participation Level: Did not attend    Clinical Observations/Individualized Feedback:     Plan: Continue to engage patient in RT group sessions 2-3x/week.   Katerina Zurn-McCall, LRT,CTRS 08/28/2023 12:19 PM

## 2023-08-28 NOTE — Group Note (Signed)
 Date:  08/28/2023 Time:  8:38 PM  Group Topic/Focus:  Wrap-Up Group:   The focus of this group is to help patients review their daily goal of treatment and discuss progress on daily workbooks.    Participation Level:  Active  Participation Quality:  Appropriate  Affect:  Appropriate  Cognitive:  Appropriate  Insight: Appropriate  Engagement in Group:  Engaged  Modes of Intervention:  Education and Exploration  Additional Comments:  Patient attended and participated in group tonight.  He reports that he had good rest today  Brendan Hogan 08/28/2023, 8:38 PM

## 2023-08-28 NOTE — Plan of Care (Addendum)
 Patient denies SI, HI and AVH. Patient is pleasant, calm and cooperative on the unit. Patient is still restricted to the unit for previous behavioral concerns such as fighting, biting, intimidating other patients, throwing water  on staff. Patient presents with more logical and coherent thought process and makes appropriate conversation with staff and other patients. Patient attends group and is adherent to fluphenazine , depakote , and cephalexin . No adverse effects were noted or reported by patient. Patient remains safe on the unit with q 15 minute safety checks ongoing.   Problem: Education: Goal: Emotional status will improve Outcome: Progressing Goal: Verbalization of understanding the information provided will improve Outcome: Progressing   Problem: Activity: Goal: Sleeping patterns will improve Outcome: Progressing   Problem: Coping: Goal: Ability to verbalize frustrations and anger appropriately will improve Outcome: Progressing Goal: Ability to demonstrate self-control will improve Outcome: Progressing

## 2023-08-29 ENCOUNTER — Encounter (HOSPITAL_COMMUNITY): Payer: Self-pay

## 2023-08-29 DIAGNOSIS — F201 Disorganized schizophrenia: Secondary | ICD-10-CM | POA: Diagnosis not present

## 2023-08-29 NOTE — Plan of Care (Signed)
   Problem: Education: Goal: Emotional status will improve Outcome: Progressing Goal: Mental status will improve Outcome: Progressing   Problem: Activity: Goal: Interest or engagement in activities will improve Outcome: Progressing Goal: Sleeping patterns will improve Outcome: Progressing

## 2023-08-29 NOTE — BHH Group Notes (Signed)
 Adult Psychoeducational Group Note  Date:  08/29/2023 Time:  8:30 PM  Group Topic/Focus:  Wrap-Up Group:   The focus of this group is to help patients review their daily goal of treatment and discuss progress on daily workbooks.  Participation Level:  Active  Participation Quality:  Appropriate  Affect:  Appropriate  Cognitive:  Appropriate  Insight: Appropriate  Engagement in Group:  Engaged  Modes of Intervention:  Discussion and Support  Additional Comments:  Pt told that today was a good day on the unit, the highlight of which was working out. On the subject of goals for the coming week, Pt only said that he hoped to leave as soon as possible. Pt rated his day a 7 out of 10.  Dal Dubin 08/29/2023, 8:30 PM

## 2023-08-29 NOTE — Progress Notes (Signed)
   08/29/23 2000  Psych Admission Type (Psych Patients Only)  Admission Status Involuntary  Psychosocial Assessment  Patient Complaints None  Eye Contact Fair  Facial Expression Anxious;Animated  Affect Euphoric  Primary school teacher;Attention-seeking  Motor Activity Hyperactive  Appearance/Hygiene Disheveled  Behavior Characteristics Cooperative  Mood Preoccupied;Suspicious;Anxious  Aggressive Behavior  Effect No apparent injury  Thought Process  Coherency WDL  Content WDL  Delusions WDL  Perception WDL  Hallucination None reported or observed  Judgment Impaired  Confusion WDL  Danger to Self  Current suicidal ideation? Denies  Danger to Others  Danger to Others None reported or observed

## 2023-08-29 NOTE — Group Note (Signed)
 Recreation Therapy Group Note   Group Topic:Goal Setting  Group Date: 08/29/2023 Start Time: 1027 End Time: 1100 Facilitators: Avika Carbine-McCall, LRT,CTRS Location: [address] Dayroom   Group Topic: Goal Setting  Goal Area(s) Addresses:  Patient will define what a goal is. Patient will identify short term and long term goals Patient will identify obstacles and needs that prevent and help reach goals.  Intervention: Worksheet, Pencils, Music   Activity: LRT and patients discussed what goals are. Patients were then given a worksheet where they had to identify goals they want to achieve in a week, month, year and 5 years. Patients would then go on to identify any obstacles that would prevent them from reaching their goals, what they need to reach their goals and what they could start doing today to work towards those goals.  Education:  Forward Thinking, Personal Purpose, Goal Setting, Support Systems, Discharge Planning  Education Outcome: Acknowledges Education/In Group Clarification Provided/Needs Additional Education   Affect/Mood: Appropriate   Participation Level: None   Participation Quality: None   Behavior: On-looking   Speech/Thought Process: Relevant   Insight: None   Judgement: None   Modes of Intervention: Worksheet   Patient Response to Interventions:  Disengaged   Education Outcome:  In group clarification offered    Clinical Observations/Individualized Feedback: Pt went in and out of group twice. Pt didn't complete the activity stating he only had one hand so he wasn't doing any work. Pt listened to the music while in group.   Plan: Continue to engage patient in RT group sessions 2-3x/week.   Anita Mcadory-McCall, LRT,CTRS 08/29/2023 12:11 PM

## 2023-08-29 NOTE — Plan of Care (Signed)
   Problem: Education: Goal: Emotional status will improve Outcome: Progressing Goal: Mental status will improve Outcome: Progressing

## 2023-08-29 NOTE — BH IP Treatment Plan (Signed)
 Interdisciplinary Treatment and Diagnostic Plan Update  08/29/2023 Time of Session: 12:00 PM - UPDATE Brendan Hogan MRN: [id number]  Principal Diagnosis: Disorganized schizophrenia (HCC)  Secondary Diagnoses: Principal Problem:   Disorganized schizophrenia (HCC) Active Problems:   Schizophrenia (HCC)   Current Medications:  Current Facility-Administered Medications  Medication Dose Route Frequency Provider Last Rate Last Admin   acetaminophen  (TYLENOL ) tablet 650 mg  650 mg Oral Q6H PRN [name], NP   650 mg at 08/29/23 1237   alum & mag hydroxide-simeth (MAALOX/MYLANTA) 200-200-20 MG/5ML suspension 30 mL  30 mL Oral Q4H PRN [name], NP       benztropine  (COGENTIN ) tablet 0.5 mg  0.5 mg Oral BID [name], NP   0.5 mg at 08/29/23 1712   cephALEXin  (KEFLEX ) capsule 500 mg  500 mg Oral Q8H Nwoko, Devra Fontana I, NP   500 mg at 08/29/23 1537   divalproex  (DEPAKOTE ) DR tablet 500 mg  500 mg Oral Q12H [name], NP   500 mg at 08/29/23 0951   feeding supplement (ENSURE ENLIVE / ENSURE PLUS) liquid 237 mL  237 mL Oral BID BM Robet Chiquito, NP   237 mL at 08/29/23 1538   fluPHENAZine  (PROLIXIN ) tablet 2.5 mg  2.5 mg Oral TID Parker, Alvin S, MD   2.5 mg at 08/29/23 1712   Or   fluPHENAZine  (PROLIXIN ) injection 2.5 mg  2.5 mg Intramuscular TID Parker, Alvin S, MD       hydrOXYzine  (ATARAX ) tablet 25 mg  25 mg Oral TID PRN [name], NP       LORazepam  (ATIVAN ) tablet 1 mg  1 mg Oral Q6H PRN [name], NP       magnesium  hydroxide (MILK OF MAGNESIA) suspension 30 mL  30 mL Oral Daily PRN [name], NP       nicotine  (NICODERM CQ  - dosed in mg/24 hours) patch 14 mg  14 mg Transdermal Daily Ntuen, Tina C, FNP       OLANZapine  (ZYPREXA ) injection 10 mg  10 mg Intramuscular TID PRN [name], NP   10 mg at 08/23/23 1015   OLANZapine  (ZYPREXA ) injection 5 mg  5 mg Intramuscular TID PRN [name], NP       OLANZapine  zydis (ZYPREXA )  disintegrating tablet 5 mg  5 mg Oral TID PRN [name], NP       propranolol  (INDERAL ) tablet 5 mg  5 mg Oral Q12H [name], NP   5 mg at 08/28/23 0865   silver  sulfADIAZINE  (SILVADENE ) 1 % cream   Topical Daily Nwoko, Agnes I, NP   Given at 08/28/23 0807   traZODone  (DESYREL ) tablet 50 mg  50 mg Oral QHS PRN [name], NP       PTA Medications: Medications Prior to Admission  Medication Sig Dispense Refill Last Dose/Taking   divalproex  (DEPAKOTE ) 500 MG DR tablet Take 1 tablet (500 mg total) by mouth every 12 (twelve) hours. For mood stabilization. 60 tablet 0 08/20/2023 Morning   benztropine  (COGENTIN ) 0.5 MG tablet Take 1 tablet (0.5 mg total) by mouth 2 (two) times daily. For prevention of drug induced tremors. (Patient not taking: Reported on 08/20/2023) 60 tablet 30 Not Taking   fluPHENAZine  (PROLIXIN ) 5 MG tablet Take 1 tablet (5 mg total) by mouth every 12 (twelve) hours. For mood control (Patient not taking: Reported on 08/20/2023) 60 tablet 0 Not Taking   hydrOXYzine  (ATARAX ) 25 MG tablet Take 1 tablet (25 mg total) by mouth 3 (three) times daily  as needed for anxiety. (Patient not taking: Reported on 08/20/2023) 90 tablet 0 Not Taking   nicotine  (NICODERM CQ  - DOSED IN MG/24 HOURS) 21 mg/24hr patch Place 1 patch (21 mg total) onto the skin daily. (May buy from over the counter): Foe smoking cessation. (Patient not taking: Reported on 08/20/2023)   Not Taking   nicotine  polacrilex (NICORETTE ) 2 MG gum Take 1 each (2 mg total) by mouth as needed. (May buy from over the counter: For smoking cessation. (Patient not taking: Reported on 08/20/2023)   Not Taking   propranolol  (INDERAL ) 10 MG tablet Take 0.5 tablets (5 mg total) by mouth every 12 (twelve) hours. For anxiety (Patient not taking: Reported on 08/20/2023) 60 tablet 0 Not Taking   traZODone  (DESYREL ) 50 MG tablet Take 1 tablet (50 mg total) by mouth at bedtime as needed for sleep. (Patient not taking: Reported on  08/20/2023) 30 tablet 0 Not Taking    Patient Stressors: Marital or family conflict   Medication change or noncompliance    Patient Strengths: Physical Health  Religious Affiliation  Supportive family/friends   Treatment Modalities: Medication Management, Group therapy, Case management,  1 to 1 session with clinician, Psychoeducation, Recreational therapy.   Physician Treatment Plan for Primary Diagnosis: Disorganized schizophrenia (HCC) Long Term Goal(s):     Short Term Goals:    Medication Management: Evaluate patient's response, side effects, and tolerance of medication regimen.  Therapeutic Interventions: 1 to 1 sessions, Unit Group sessions and Medication administration.  Evaluation of Outcomes: Progressing  Physician Treatment Plan for Secondary Diagnosis: Principal Problem:   Disorganized schizophrenia (HCC) Active Problems:   Schizophrenia (HCC)  Long Term Goal(s):     Short Term Goals:       Medication Management: Evaluate patient's response, side effects, and tolerance of medication regimen.  Therapeutic Interventions: 1 to 1 sessions, Unit Group sessions and Medication administration.  Evaluation of Outcomes: Progressing   RN Treatment Plan for Primary Diagnosis: Disorganized schizophrenia (HCC) Long Term Goal(s): Knowledge of disease and therapeutic regimen to maintain health will improve  Short Term Goals: Ability to remain free from injury will improve, Ability to verbalize frustration and anger appropriately will improve, Ability to participate in decision making will improve, Ability to verbalize feelings will improve, Ability to identify and develop effective coping behaviors will improve, and Compliance with prescribed medications will improve  Medication Management: RN will administer medications as ordered by provider, will assess and evaluate patient's response and provide education to patient for prescribed medication. RN will report any adverse  and/or side effects to prescribing provider.  Therapeutic Interventions: 1 on 1 counseling sessions, Psychoeducation, Medication administration, Evaluate responses to treatment, Monitor vital signs and CBGs as ordered, Perform/monitor CIWA, COWS, AIMS and Fall Risk screenings as ordered, Perform wound care treatments as ordered.  Evaluation of Outcomes: Progressing   LCSW Treatment Plan for Primary Diagnosis: Disorganized schizophrenia (HCC) Long Term Goal(s): Safe transition to appropriate next level of care at discharge, Engage patient in therapeutic group addressing interpersonal concerns.  Short Term Goals: Engage patient in aftercare planning with referrals and resources, Increase ability to appropriately verbalize feelings, Facilitate acceptance of mental health diagnosis and concerns, and Identify triggers associated with mental health/substance abuse issues  Therapeutic Interventions: Assess for all discharge needs, 1 to 1 time with Social worker, Explore available resources and support systems, Assess for adequacy in community support network, Educate family and significant other(s) on suicide prevention, Complete Psychosocial Assessment, Interpersonal group therapy.  Evaluation of Outcomes: Progressing  Progress in Treatment: Attending groups: Yes. Participating in groups: Yes. Taking medication as prescribed: Yes Toleration medication: Yes. Family/Significant other contact made: Yes, contacted:  Magda Schneider (father) [phone] Patient understands diagnosis: No. Discussing patient identified problems/goals with staff: Yes. Medical problems stabilized or resolved: Yes. Denies suicidal/homicidal ideation: Yes. Issues/concerns per patient self-inventory: No.   New problem(s) identified: No, Describe:  none   New Short Term/Long Term Goal(s): medication stabilization, elimination of SI thoughts, development of comprehensive mental wellness plan.      Patient Goals:  "I am only  taking Depakote "    Discharge Plan or Barriers: Patient recently admitted. CSW will continue to follow and assess for appropriate referrals and possible discharge planning.      Reason for Continuation of Hospitalization: Delusions  Hallucinations Medication stabilization   Estimated Length of Stay:  4 - 6 days  Last 3 [place] Suicide Severity Risk Score: Flowsheet Row Ad[facility] (Current) [facility] from 08/18/2023 in [facility] ED from 06/09/2023 in [facility] Department at [facility]  C-SSRS RISK CATEGORY No Risk No Risk No Risk       Last [facility] 2/9 Scores:    07/11/2022   11:18 AM 06/27/2022    2:09 PM 02/18/2022   12:20 PM  Depression screen PHQ 2/9  Decreased Interest 0 1 0  Down, Depressed, Hopeless 1 0 0  PHQ - 2 Score 1 1 0  Altered sleeping 3 2 0  Tired, decreased energy 2 2 0  Change in appetite 1 1 0  Feeling bad or failure about yourself  0 0 0  Trouble concentrating 3 1 0  Moving slowly or fidgety/restless 3 2 0  Suicidal thoughts 0 0 0  PHQ-9 Score 13 9 0  Difficult doing work/chores   Not difficult at all    Scribe for Treatment Team: Inza Mikrut O Tyshia Fenter, LCSWA 08/29/2023 5:40 PM

## 2023-08-29 NOTE — Progress Notes (Signed)
   08/29/23 1020  Psych Admission Type (Psych Patients Only)  Admission Status Involuntary  Psychosocial Assessment  Patient Complaints None  Eye Contact Fair  Facial Expression Animated  Affect Euphoric  Speech Logical/coherent  Interaction Assertive  Motor Activity Hyperactive  Appearance/Hygiene Disheveled  Behavior Characteristics Cooperative  Mood Euthymic  Thought Process  Coherency WDL  Content WDL  Delusions None reported or observed  Perception WDL  Hallucination None reported or observed  Judgment Poor  Confusion None  Danger to Self  Current suicidal ideation? Denies  Agreement Not to Harm Self Yes  Description of Agreement verbal  Danger to Others  Danger to Others None reported or observed
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
## 2023-08-30 DIAGNOSIS — F201 Disorganized schizophrenia: Secondary | ICD-10-CM | POA: Diagnosis not present

## 2023-08-30 MED ORDER — FLUPHENAZINE HCL 2.5 MG/ML IJ SOLN
2.5000 mg | Freq: Two times a day (BID) | INTRAMUSCULAR | Status: DC
  Filled 2023-08-30 (×10): qty 1

## 2023-08-30 MED ORDER — FLUPHENAZINE HCL 2.5 MG/ML IJ SOLN
5.0000 mg | Freq: Every day | INTRAMUSCULAR | Status: DC
  Filled 2023-08-30 (×5): qty 2

## 2023-08-30 MED ORDER — FLUPHENAZINE HCL 2.5 MG PO TABS
2.5000 mg | ORAL_TABLET | Freq: Two times a day (BID) | ORAL | Status: DC
  Administered 2023-08-30 – 2023-09-03 (×8): 2.5 mg via ORAL
  Filled 2023-08-30 (×11): qty 1

## 2023-08-30 MED ORDER — FLUPHENAZINE HCL 5 MG PO TABS
5.0000 mg | ORAL_TABLET | Freq: Every day | ORAL | Status: DC
Start: 2023-08-30 — End: 2023-09-02
  Administered 2023-08-30 – 2023-09-02 (×4): 5 mg via ORAL
  Filled 2023-08-30 (×6): qty 1

## 2023-08-30 NOTE — [facility] Group Notes (Signed)
 Adult Psychoeducational Group Note  Date:  08/30/2023 Time:  1:06 PM  Group Topic/Focus:  Goals Group:   The focus of this group is to help patients establish daily goals to achieve during treatment and discuss how the patient can incorporate goal setting into their daily lives to aide in recovery.  Participation Level:  Active  Participation Quality:  Appropriate  Affect:  Appropriate  Cognitive:  Appropriate  Insight: Appropriate  Engagement in Group:  Engaged  Modes of Intervention:  Discussion  Additional Comments: The patient attended group.  Eliakim Tendler Lee 08/30/2023, 1:06 PM

## 2023-08-30 NOTE — Plan of Care (Signed)
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
## 2023-08-30 NOTE — Group Note (Signed)
 Date:  08/30/2023 Time:  8:25 PM  Group Topic/Focus:  Wrap-Up Group:   The focus of this group is to help patients review their daily goal of treatment and discuss progress on daily workbooks.    Participation Level:  Active  Participation Quality:  Appropriate  Affect:  Appropriate  Cognitive:  Appropriate  Insight: Appropriate  Engagement in Group:  Engaged  Modes of Intervention:  Education and Exploration  Additional Comments:  Patient attended and participated in group tonight. He reporssts that today was OK.  He attended one group and slept most of the day.  Eugena Herter Dacosta 08/30/2023, 8:25 PM
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
## 2023-08-30 NOTE — Plan of Care (Signed)
 Problem: Education: Goal: Knowledge of [facility] [place] [facility] Education information/materials will improve Outcome: Progressing Goal: Emotional status will improve Outcome: Progressing Goal: Mental status will improve Outcome: Progressing Goal: Verbalization of understanding the information provided will improve Outcome: Progressing   Problem: Activity: Goal: Interest or engagement in activities will improve Outcome: Progressing Goal: Sleeping patterns will improve Outcome: Progressing   Problem: Coping: Goal: Ability to verbalize frustrations and anger appropriately will improve Outcome: Progressing Goal: Ability to demonstrate self-control will improve Outcome: Progressing   Problem: Health Behavior/Discharge Planning: Goal: Identification of resources available to assist in meeting health care needs will improve Outcome: Progressing Goal: Compliance with treatment plan for underlying cause of condition will improve Outcome: Progressing   Problem: Physical Regulation: Goal: Ability to maintain clinical measurements within normal limits will improve Outcome: Progressing   Problem: Safety: Goal: Periods of time without injury will increase Outcome: Progressing   Problem: Activity: Goal: Will verbalize the importance of balancing activity with adequate rest periods Outcome: Progressing   Problem: Education: Goal: Will be free of psychotic symptoms Outcome: Progressing Goal: Knowledge of the prescribed therapeutic regimen will improve Outcome: Progressing   Problem: Coping: Goal: Coping ability will improve Outcome: Progressing Goal: Will verbalize feelings Outcome: Progressing   Problem: Health Behavior/Discharge Planning: Goal: Compliance with prescribed medication regimen will improve Outcome: Progressing   Problem: Nutritional: Goal: Ability to achieve adequate nutritional intake will improve Outcome: Progressing   Problem: Role Relationship: Goal:  Ability to communicate needs accurately will improve Outcome: Progressing Goal: Ability to interact with others will improve Outcome: Progressing   Problem: Safety: Goal: Ability to redirect hostility and anger into socially appropriate behaviors will improve Outcome: Progressing Goal: Ability to remain free from injury will improve Outcome: Progressing   Problem: Self-Care: Goal: Ability to participate in self-care as condition permits will improve Outcome: Progressing   Problem: Self-Concept: Goal: Will verbalize positive feelings about self Outcome: Progressing   Problem: Education: Goal: Knowledge of [place] [facility] Education information/materials will improve Outcome: Progressing Goal: Emotional status will improve Outcome: Progressing Goal: Mental status will improve Outcome: Progressing Goal: Verbalization of understanding the information provided will improve Outcome: Progressing   Problem: Activity: Goal: Interest or engagement in activities will improve Outcome: Progressing Goal: Sleeping patterns will improve Outcome: Progressing   Problem: Coping: Goal: Ability to verbalize frustrations and anger appropriately will improve Outcome: Progressing Goal: Ability to demonstrate self-control will improve Outcome: Progressing   Problem: Health Behavior/Discharge Planning: Goal: Identification of resources available to assist in meeting health care needs will improve Outcome: Progressing Goal: Compliance with treatment plan for underlying cause of condition will improve Outcome: Progressing   Problem: Physical Regulation: Goal: Ability to maintain clinical measurements within normal limits will improve Outcome: Progressing   Problem: Safety: Goal: Periods of time without injury will increase Outcome: Progressing   Problem: Education: Goal: Ability to incorporate positive changes in behavior to improve self-esteem will improve Outcome: Progressing    Problem: Health Behavior/Discharge Planning: Goal: Ability to identify and utilize available resources and services will improve Outcome: Progressing Goal: Ability to remain free from injury will improve Outcome: Progressing   Problem: Self-Concept: Goal: Will verbalize positive feelings about self Outcome: Progressing   Problem: Skin Integrity: Goal: Demonstration of wound healing without infection will improve Outcome: Progressing   Problem: Activity: Goal: Will identify at least one activity in which they can participate Outcome: Progressing   Problem: Coping: Goal: Ability to identify and develop effective coping behavior will improve Outcome: Progressing Goal:  Ability to interact with others will improve Outcome: Progressing Goal: Demonstration of participation in decision-making regarding own care will improve Outcome: Progressing Goal: Ability to use eye contact when communicating with others will improve Outcome: Progressing   Problem: Health Behavior/Discharge Planning: Goal: Identification of resources available to assist in meeting health care needs will improve Outcome: Progressing

## 2023-08-31 DIAGNOSIS — F201 Disorganized schizophrenia: Secondary | ICD-10-CM | POA: Diagnosis not present

## 2023-08-31 MED ORDER — SODIUM CHLORIDE 0.9 % IN NEBU
INHALATION_SOLUTION | RESPIRATORY_TRACT | Status: AC
  Filled 2023-08-31: qty 6

## 2023-08-31 MED ORDER — TRAZODONE HCL 100 MG PO TABS
100.0000 mg | ORAL_TABLET | Freq: Every evening | ORAL | Status: DC | PRN
  Administered 2023-09-02: 100 mg via ORAL
  Filled 2023-08-31 (×3): qty 1
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
## 2023-08-31 NOTE — Plan of Care (Signed)
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
## 2023-08-31 NOTE — Progress Notes (Signed)
   08/30/23 2100  Psych Admission Type (Psych Patients Only)  Admission Status Involuntary  Psychosocial Assessment  Patient Complaints None  Eye Contact Fair  Facial Expression Animated  Affect Euphoric  Speech Logical/coherent  Interaction Assertive;Attention-seeking  Motor Activity Hyperactive  Appearance/Hygiene Disheveled  Behavior Characteristics Cooperative  Mood Preoccupied  Thought Process  Coherency WDL  Content WDL  Delusions None reported or observed  Perception WDL  Hallucination None reported or observed  Judgment Impaired  Confusion WDL  Danger to Self  Current suicidal ideation? Denies  Agreement Not to Harm Self Yes  Description of Agreement verbal  Danger to Others  Danger to Others None reported or observed
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
## 2023-08-31 NOTE — Progress Notes (Signed)
   08/31/23 1500  Psych Admission Type (Psych Patients Only)  Admission Status Involuntary  Psychosocial Assessment  Patient Complaints Agitation  Eye Contact Fair  Facial Expression Animated;Anxious  Affect Appropriate to circumstance  Speech Logical/coherent  Interaction Assertive;Attention-seeking  Motor Activity Fidgety  Appearance/Hygiene Disheveled  Behavior Characteristics Cooperative  Mood Preoccupied;Anxious  Thought Process  Coherency WDL  Content WDL  Delusions None reported or observed  Perception WDL  Hallucination None reported or observed  Judgment Impaired  Confusion None  Danger to Self  Current suicidal ideation? Denies  Agreement Not to Harm Self Yes  Description of Agreement verbal  Danger to Others  Danger to Others None reported or observed

## 2023-08-31 NOTE — Group Note (Signed)
 Date:  08/31/2023 Time:  8:52 PM  Group Topic/Focus:  Wrap-Up Group:   The focus of this group is to help patients review their daily goal of treatment and discuss progress on daily workbooks.    Participation Level:  Active  Participation Quality:  Appropriate  Affect:  Appropriate  Cognitive:  Appropriate  Insight: Appropriate  Engagement in Group:  Engaged  Modes of Intervention:  Education and Exploration  Additional Comments:  Patient attended and participated in group tonight.  He reports that today he learn not to cause too much problems in order to leave.  Brendan Hogan 08/31/2023, 8:52 PM

## 2023-09-01 ENCOUNTER — Inpatient Hospital Stay (HOSPITAL_COMMUNITY): Payer: Self-pay

## 2023-09-01 DIAGNOSIS — F201 Disorganized schizophrenia: Secondary | ICD-10-CM | POA: Diagnosis not present

## 2023-09-01 MED ORDER — SODIUM CHLORIDE 0.9 % IN NEBU
INHALATION_SOLUTION | RESPIRATORY_TRACT | Status: AC
  Filled 2023-09-01: qty 3

## 2023-09-01 NOTE — Group Note (Signed)
 LCSW Group Therapy Note   Group Date: 09/01/2023 Start Time: 1300 End Time: 1400   Participation:  patient was present, listened and read the quotes.    Type of Therapy:  Group Therapy  Topic:  [facility]:  A Safe Space for Grief  Objective:  To create a compassionate group space where participants can process grief, learn about its stages, and explore personal rituals to honor lost loved ones.   Goals:  1. Provide a safe and supportive space where participants feel comfortable sharing their feelings and experiences of grief without judgment.  2. Educate participants about the stages of grief and emphasize that there is no "right" way to grieve or a fixed timeline for healing.  3. Introduce the concept of rituals to process grief, allowing individuals to honor their loved ones in a personal and meaningful way.  Summary:  In [facility]: A Safe Space for Grief, participants explored the unique and personal nature of grief, with a focus on the five stages (denial, anger, bargaining, depression, acceptance) as a flexible, non-linear process. The group introduced meaningful rituals - like lighting candles or memory walks - as tools for honoring loved ones and managing difficult emotions. Through shared experiences, participants received emotional support, practiced self-care, and reflected on gratitude, emphasizing that healing has no fixed timeline.  Therapeutic Modalities Used: Cognitive Behavioral Therapy (CBT):   Psychoeducation on grief stages, Challenging unhelpful thoughts (e.g., "I should be over this") Dialectical Behavior Therapy (DBT):  Mindfulness (staying present with grief-related emotions), Distress tolerance (using rituals as grounding tools) Group Therapy/Supportive Elements:  Emotional validation and peer support, Encouragement of open sharing in a nonjudgmental space   Amos Balint, LCSWA 09/01/2023  5:40 PM

## 2023-09-01 NOTE — Progress Notes (Signed)
   09/01/23 2130  Psych Admission Type (Psych Patients Only)  Admission Status Involuntary  Psychosocial Assessment  Patient Complaints Anxiety  Eye Contact Fair  Facial Expression Anxious  Affect Anxious;Preoccupied  Speech Argumentative  Interaction Assertive  Motor Activity Restless  Appearance/Hygiene Disheveled  Behavior Characteristics Cooperative  Mood Suspicious;Preoccupied;Anxious  Aggressive Behavior  Effect No apparent injury  Thought Process  Coherency Circumstantial  Content Blaming others  Delusions None reported or observed  Perception WDL  Hallucination None reported or observed  Judgment WDL  Confusion WDL  Danger to Self  Current suicidal ideation? Denies  Danger to Others  Danger to Others None reported or observed

## 2023-09-01 NOTE — Progress Notes (Signed)
 Pt verbalized feeling anxious and requested PRN Ativan .   PRN medication administered as ordered.

## 2023-09-01 NOTE — Progress Notes (Signed)
 Pt returned from CT evaluation without issues. Visited with his father this evening, observed to be animated but argumentative with father and staff when attempting to talk to him about safety measures in milieu and appropriate means of interactions with others. Continues to demand ativan  throughout this shift "I want it, you should give it to me anytime I need. Awake, pacing in hall at this time. Safety maintained at Q 15 minutes intervals without further incident at this time.

## 2023-09-01 NOTE — Plan of Care (Signed)
   Problem: Education: Goal: Emotional status will improve Outcome: Not Progressing Goal: Mental status will improve Outcome: Not Progressing

## 2023-09-01 NOTE — Progress Notes (Incomplete)
 Patient is doing better now that he is on antipsychotic medication.  He is more rationale and not exhibiting any dangerousness.  He is eager to be discharged.  We will get feedback from his family and finalize aftercare.  Hopeful discharge on Wednesday if he maintains progress.

## 2023-09-01 NOTE — Group Note (Signed)
 Recreation Therapy Group Note   Group Topic:Stress Management  Group Date: 09/01/2023 Start Time: 1030 End Time: 1105 Facilitators: Renisha Cockrum-McCall, LRT,CTRS Location: [address] Day[address]   Group Topic:Stress Management  Group Date: 09/01/2023 Start Time: 1030 End Time: 1105 Facilitators: Johnisha Louks-McCall, LRT,CTRS Location: [address] Dayroom     Group Topic: Stress Management   Goal Area(s) Addresses:  Patient will identify positive stress management techniques. Patient will identify benefits of using stress management post d/c.   Intervention: Music, Speaker   Activity: Music Therapy. LRT discussed the importance music can play helping to relieve stress and helping people to create calm spaces. Patients could request the songs of their choosing that were clean, appropriate and provided a special feeling to individual.     Education:  Stress Management, Discharge Planning.    Education Outcome: Acknowledges Education   Affect/Mood: N/A   Participation Level: Did not attend    Clinical Observations/Individualized Feedback:    Plan: Continue to engage patient in RT group sessions 2-3x/week.   Mellody Masri-McCall, LRT,CTRS 09/01/2023 1:37 PM

## 2023-09-01 NOTE — Progress Notes (Signed)
 Pt has continuously refused to take his propanolol medication targeted for his anxiety. Pt educated on medication but remains hesitant about taking it.
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
## 2023-09-01 NOTE — Plan of Care (Signed)
   Problem: Education: Goal: Emotional status will improve Outcome: Progressing Goal: Mental status will improve Outcome: Progressing   Problem: Activity: Goal: Interest or engagement in activities will improve Outcome: Progressing Goal: Sleeping patterns will improve Outcome: Progressing

## 2023-09-01 NOTE — Group Note (Signed)
 Date:  09/01/2023 Time:  8:43 PM  Group Topic/Focus:  Wrap-Up Group:   The focus of this group is to help patients review their daily goal of treatment and discuss progress on daily workbooks.    Participation Level:  Active  Participation Quality:  Appropriate  Affect:  Appropriate  Cognitive:  Appropriate  Insight: Appropriate  Engagement in Group:  Engaged  Modes of Intervention:  Education and Exploration  Additional Comments:  Patient attended and participated in group tonight. He reports that the thing he likes about himself is that he does not lest things get to him.j   The thing he does not like about himself is his impulsiveness.  Eugena Herter Dacosta 09/01/2023, 8:43 PM

## 2023-09-02 DIAGNOSIS — F201 Disorganized schizophrenia: Secondary | ICD-10-CM | POA: Diagnosis not present

## 2023-09-02 MED ORDER — SODIUM CHLORIDE 0.9 % IN NEBU
INHALATION_SOLUTION | RESPIRATORY_TRACT | Status: AC
  Administered 2023-09-02: 5 mL
  Filled 2023-09-02: qty 3

## 2023-09-02 MED ORDER — FLUPHENAZINE HCL 2.5 MG/ML IJ SOLN
5.0000 mg | Freq: Every day | INTRAMUSCULAR | Status: DC
  Filled 2023-09-02: qty 2

## 2023-09-02 MED ORDER — FLUPHENAZINE HCL 5 MG PO TABS
5.0000 mg | ORAL_TABLET | Freq: Every day | ORAL | Status: DC
  Filled 2023-09-02: qty 1

## 2023-09-02 NOTE — Progress Notes (Incomplete)
 Patient seems to be at his baseline.  His family remains supportive and willing to take him back home.  No current dangerousness.  We are finalizing aftercare.  Hopeful discharge tomorrow if he maintains progress.

## 2023-09-02 NOTE — Plan of Care (Signed)
  Problem: Health Behavior/Discharge Planning: Goal: Compliance with treatment plan for underlying cause of condition will improve Outcome: Progressing   Problem: Nutritional: Goal: Ability to achieve adequate nutritional intake will improve Outcome: Progressing   Problem: Health Behavior/Discharge Planning: Goal: Ability to remain free from injury will improve Outcome: Progressing
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
## 2023-09-02 NOTE — Group Note (Signed)
 Recreation Therapy Group Note   Group Topic:Coping Skills  Group Date: 09/02/2023 Start Time: 1045 End Time: 1130 Facilitators: Natacia Chaisson-McCall, LRT,CTRS Location: [address] Dayroom  Group Topic: Coping Skills    Goal Area(s) Addresses: Patient will define what a coping skill is. Patient will create a list of healthy coping skills beginning with each letter of the alphabet. Patient will successfully identify positive coping skills they can use post d/c.  Patient will acknowledge benefit(s) of using learned coping skills post d/c.   Intervention: Group work   Activity: Coping A to Z. Patient asked to identify what a coping skill is and when they use them. Patients with Clinical research associate discussed healthy versus unhealthy coping skills. Next patients were given a blank worksheet titled "Coping Skills A-Z". Partners were instructed to come up with at least one positive coping skill per letter of the alphabet. Patients were given 15 minutes to brainstorm before ideas were presented to the large group. Patients and LRT debriefed on the importance of coping skill selection based on situation and back-up plans when a skill tried is not effective. At the end of group, patients were given an handout of alphabetized strategies to keep for future reference.   Education: Pharmacologist, Scientist, physiological, Discharge Planning.    Education Outcome: Acknowledges education/Verbalizes understanding/In group clarification offered/Additional education needed    Affect/Mood: Appropriate   Participation Level: None   Participation Quality: None   Behavior: On-looking   Speech/Thought Process: None   Insight: None   Judgement: None   Modes of Intervention: Worksheet   Patient Response to Interventions:  Disengaged   Education Outcome:  In group clarification offered    Clinical Observations/Individualized Feedback: Pt came in late to group. Pt didn't participate in activity. Pt observed peers as  they worked.    Plan: Continue to engage patient in RT group sessions 2-3x/week.   Gerrianne Aydelott-McCall, LRT,CTRS  09/02/2023 1:34 PM
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
## 2023-09-03 ENCOUNTER — Encounter (HOSPITAL_COMMUNITY): Payer: Self-pay

## 2023-09-03 DIAGNOSIS — F201 Disorganized schizophrenia: Secondary | ICD-10-CM | POA: Diagnosis not present

## 2023-09-03 MED ORDER — FLUPHENAZINE HCL 2.5 MG/ML IJ SOLN
2.5000 mg | Freq: Two times a day (BID) | INTRAMUSCULAR | Status: DC
  Filled 2023-09-03 (×12): qty 1

## 2023-09-03 MED ORDER — FLUPHENAZINE HCL 10 MG PO TABS
10.0000 mg | ORAL_TABLET | Freq: Every day | ORAL | Status: DC
  Administered 2023-09-03 – 2023-09-05 (×3): 10 mg via ORAL
  Filled 2023-09-03 (×6): qty 1

## 2023-09-03 MED ORDER — FLUPHENAZINE HCL 5 MG PO TABS
5.0000 mg | ORAL_TABLET | Freq: Two times a day (BID) | ORAL | Status: DC
  Administered 2023-09-03 – 2023-09-06 (×7): 5 mg via ORAL
  Filled 2023-09-03 (×12): qty 1

## 2023-09-03 MED ORDER — FLUPHENAZINE HCL 2.5 MG/ML IJ SOLN: 2.5000 mg | Freq: Two times a day (BID) | INTRAMUSCULAR | Status: DC

## 2023-09-03 MED ORDER — FLUPHENAZINE HCL 10 MG PO TABS: 10.0000 mg | ORAL_TABLET | Freq: Two times a day (BID) | ORAL | Status: DC

## 2023-09-03 MED ORDER — FLUPHENAZINE HCL 2.5 MG/ML IJ SOLN
5.0000 mg | Freq: Every day | INTRAMUSCULAR | Status: DC
  Filled 2023-09-03 (×6): qty 2
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
## 2023-09-03 NOTE — Progress Notes (Signed)
 Patient has disorganised mood. His Father visited was having arguments with him. Staff had to escort the Father off the unit. Patient continues to be intrusive. Compliant with medications denies SI/HI/A/VH and verbally contracts for safety. Patient requesting for Ativan  for anxiety Prn Hydroxyzine  25 mg PO given and Trazodone  for sleep at HS.   Q15 minutes safety checks ongoing Patient remains safe.

## 2023-09-03 NOTE — Plan of Care (Signed)
  Problem: Education: Goal: Emotional status will improve Outcome: Progressing Goal: Verbalization of understanding the information provided will improve Outcome: Progressing   Problem: Activity: Goal: Sleeping patterns will improve Outcome: Progressing   Problem: Coping: Goal: Ability to demonstrate self-control will improve Outcome: Progressing

## 2023-09-03 NOTE — BH IP Treatment Plan (Signed)
 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[facility])  Secondary Diagnoses: Principal Problem:   Disorganized schizophrenia (HCC) Active Problems:   Schizophrenia (HCC)   Current Medications:  Current Facility-Administered Medications  Medication Dose Route Frequency Provider Last Rate Last Admin   acetaminophen  (TYLENOL ) tablet 650 mg  650 mg Oral Q6H PRN [name], NP   650 mg at 08/29/23 1237   alum & mag hydroxide-simeth (MAALOX/MYLANTA) 200-200-20 MG/5ML suspension 30 mL  30 mL Oral Q4H PRN [name], NP       benztropine  (COGENTIN ) tablet 0.5 mg  0.5 mg Oral BID [name], NP   0.5 mg at 09/03/23 4098   cephALEXin  (KEFLEX ) capsule 500 mg  500 mg Oral Q8H Nwoko, Devra Fontana I, NP   500 mg at 09/03/23 1191   divalproex  (DEPAKOTE ) DR tablet 500 mg  500 mg Oral Q12H [name], NP   500 mg at 09/03/23 0815   feeding supplement (ENSURE ENLIVE / ENSURE PLUS) liquid 237 mL  237 mL Oral BID BM Robet Chiquito, NP   237 mL at 09/03/23 0816   fluPHENAZine  (PROLIXIN ) tablet 5 mg  5 mg Oral BID Izediuno, Iline Mallory, MD       Or   fluPHENAZine  (PROLIXIN ) injection 2.5 mg  2.5 mg Intramuscular BID Izediuno, Iline Mallory, MD       fluPHENAZine  (PROLIXIN ) tablet 10 mg  10 mg Oral QHS Izediuno, Iline Mallory, MD       Or   fluPHENAZine  (PROLIXIN ) injection 5 mg  5 mg Intramuscular QHS Izediuno, Iline Mallory, MD       hydrOXYzine  (ATARAX ) tablet 25 mg  25 mg Oral TID PRN [name], NP   25 mg at 09/02/23 2055   magnesium  hydroxide (MILK OF MAGNESIA) suspension 30 mL  30 mL Oral Daily PRN [name], NP       OLANZapine  (ZYPREXA ) injection 10 mg  10 mg Intramuscular TID PRN [name], NP   10 mg at 09/01/23 1729   OLANZapine  (ZYPREXA ) injection 5 mg  5 mg Intramuscular TID PRN [name], NP       OLANZapine  zydis (ZYPREXA )  disintegrating tablet 5 mg  5 mg Oral TID PRN [name], NP       propranolol  (INDERAL ) tablet 5 mg  5 mg Oral Q12H [name], NP   5 mg at 09/02/23 2057   silver  sulfADIAZINE  (SILVADENE ) 1 % cream   Topical Daily Nwoko, Agnes I, NP   1 Application at 09/02/23 0850   traZODone  (DESYREL ) tablet 100 mg  100 mg Oral QHS PRN Augusta Blizzard, MD   100 mg at 09/02/23 2055   PTA Medications: Medications Prior to Admission  Medication Sig Dispense Refill Last Dose/Taking   divalproex  (DEPAKOTE ) 500 MG DR tablet Take 1 tablet (500 mg total) by mouth every 12 (twelve) hours. For mood stabilization. 60 tablet 0 08/20/2023 Morning   benztropine  (COGENTIN ) 0.5 MG tablet Take 1 tablet (0.5 mg total) by mouth 2 (two) times daily. For prevention of drug induced tremors. (Patient not taking: Reported on 08/20/2023) 60 tablet 30 Not Taking   fluPHENAZine  (PROLIXIN ) 5 MG tablet Take 1 tablet (5 mg total) by mouth every 12 (twelve) hours. For mood control (Patient not taking: Reported on 08/20/2023) 60 tablet 0 Not Taking   hydrOXYzine  (ATARAX ) 25 MG tablet Take 1 tablet (25 mg total) by mouth 3 (three) times daily  as needed for anxiety. (Patient not taking: Reported on 08/20/2023) 90 tablet 0 Not Taking   nicotine  (NICODERM CQ  - DOSED IN MG/24 HOURS) 21 mg/24hr patch Place 1 patch (21 mg total) onto the skin daily. (May buy from over the counter): Foe smoking cessation. (Patient not taking: Reported on 08/20/2023)   Not Taking   nicotine  polacrilex (NICORETTE ) 2 MG gum Take 1 each (2 mg total) by mouth as needed. (May buy from over the counter: For smoking cessation. (Patient not taking: Reported on 08/20/2023)   Not Taking   propranolol  (INDERAL ) 10 MG tablet Take 0.5 tablets (5 mg total) by mouth every 12 (twelve) hours. For anxiety (Patient not taking: Reported on 08/20/2023) 60 tablet 0 Not Taking   traZODone  (DESYREL ) 50 MG tablet Take 1 tablet (50 mg total) by mouth at bedtime as needed for sleep. (Patient not  taking: Reported on 08/20/2023) 30 tablet 0 Not Taking    Patient Stressors: Marital or family conflict   Medication change or noncompliance    Patient Strengths: Physical Health  Religious Affiliation  Supportive family/friends   Treatment Modalities: Medication Management, Group therapy, Case management,  1 to 1 session with clinician, Psychoeducation, Recreational therapy.   Physician Treatment Plan for Primary Diagnosis: Disorganized schizophrenia (HCC) Long Term Goal(s):     Short Term Goals:    Medication Management: Evaluate patient's response, side effects, and tolerance of medication regimen.  Therapeutic Interventions: 1 to 1 sessions, Unit Group sessions and Medication administration.  Evaluation of Outcomes: Progressing  Physician Treatment Plan for Secondary Diagnosis: Principal Problem:   Disorganized schizophrenia (HCC) Active Problems:   Schizophrenia (HCC)  Long Term Goal(s):     Short Term Goals:       Medication Management: Evaluate patient's response, side effects, and tolerance of medication regimen.  Therapeutic Interventions: 1 to 1 sessions, Unit Group sessions and Medication administration.  Evaluation of Outcomes: Progressing   RN Treatment Plan for Primary Diagnosis: Disorganized schizophrenia (HCC) Long Term Goal(s): Knowledge of disease and therapeutic regimen to maintain health will improve  Short Term Goals: Ability to remain free from injury will improve, Ability to verbalize frustration and anger appropriately will improve, Ability to demonstrate self-control, Ability to participate in decision making will improve, Ability to verbalize feelings will improve, and Ability to identify and develop effective coping behaviors will improve  Medication Management: RN will administer medications as ordered by provider, will assess and evaluate patient's response and provide education to patient for prescribed medication. RN will report any adverse  and/or side effects to prescribing provider.  Therapeutic Interventions: 1 on 1 counseling sessions, Psychoeducation, Medication administration, Evaluate responses to treatment, Monitor vital signs and CBGs as ordered, Perform/monitor CIWA, COWS, AIMS and Fall Risk screenings as ordered, Perform wound care treatments as ordered.  Evaluation of Outcomes: Progressing   LCSW Treatment Plan for Primary Diagnosis: Disorganized schizophrenia (HCC) Long Term Goal(s): Safe transition to appropriate next level of care at discharge, Engage patient in therapeutic group addressing interpersonal concerns.  Short Term Goals: Engage patient in aftercare planning with referrals and resources, Increase social support, Increase ability to appropriately verbalize feelings, Increase emotional regulation, and Increase skills for wellness and recovery  Therapeutic Interventions: Assess for all discharge needs, 1 to 1 time with Social worker, Explore available resources and support systems, Assess for adequacy in community support network, Educate family and significant other(s) on suicide prevention, Complete Psychosocial Assessment, Interpersonal group therapy.  Evaluation of Outcomes: Progressing   Progress in Treatment: Attending  groups: Yes. Participating in groups: Yes. Taking medication as prescribed: Yes Toleration medication: Yes. Family/Significant other contact made: Yes, contacted:  Magda Schneider (father) [phone] Patient understands diagnosis: No. Discussing patient identified problems/goals with staff: Yes. Medical problems stabilized or resolved: Yes. Denies suicidal/homicidal ideation: Yes. Issues/concerns per patient self-inventory: No.   New problem(s) identified: No, Describe:  none   New Short Term/Long Term Goal(s): medication stabilization, elimination of SI thoughts, development of comprehensive mental wellness plan.      Patient Goals:  "I am only taking Depakote "    Discharge Plan  or Barriers: Patient recently admitted. CSW will continue to follow and assess for appropriate referrals and possible discharge planning.      Reason for Continuation of Hospitalization: Delusions  Hallucinations Medication stabilization   Estimated Length of Stay:  2-4 days  Last 3 [place] Suicide Severity Risk Score: Flowsheet Row Admission (Current) [facility] from 08/18/2023 in [facility] Department at [facility] ED from 06/09/2023 in [facility] Emergency Department at [facility]  C-SSRS RISK CATEGORY No Risk No Risk No Risk       Last [facility] 2/9 Scores:    07/11/2022   11:18 AM 06/27/2022    2:09 PM 02/18/2022   12:20 PM  Depression screen PHQ 2/9  Decreased Interest 0 1 0  Down, Depressed, Hopeless 1 0 0  PHQ - 2 Score 1 1 0  Altered sleeping 3 2 0  Tired, decreased energy 2 2 0  Change in appetite 1 1 0  Feeling bad or failure about yourself  0 0 0  Trouble concentrating 3 1 0  Moving slowly or fidgety/restless 3 2 0  Suicidal thoughts 0 0 0  PHQ-9 Score 13 9 0  Difficult doing work/chores   Not difficult at all    Scribe for Treatment Team: Vonzell Guerin, LCSWA 09/03/2023 11:40 AM

## 2023-09-03 NOTE — BHH Group Notes (Signed)
 Adult Psychoeducational Group Note  Date:  09/03/2023 Time:  5:17 PM  Group Topic/Focus:  Goals Group:   The focus of this group is to help patients establish daily goals to achieve during treatment and discuss how the patient can incorporate goal setting into their daily lives to aide in recovery. Orientation:   The focus of this group is to educate the patient on the purpose and policies of crisis stabilization and provide a format to answer questions about their admission.  The group details unit policies and expectations of patients while admitted.  Participation Level:  Did Not Attend  Participation Quality:    Affect:    Cognitive:    Insight:   Engagement in Group:    Modes of Intervention:    Additional Comments:    Cuthbert Turton O 09/03/2023, 5:17 PM

## 2023-09-03 NOTE — BHH Group Notes (Signed)
 Adult Psychoeducational Group Note  Date:  09/03/2023 Time:  8:38 PM  Group Topic/Focus:  Wrap-Up Group:   The focus of this group is to help patients review their daily goal of treatment and discuss progress on daily workbooks.  Participation Level:  Did Not Attend  Dal Dubin 09/03/2023, 8:38 PM

## 2023-09-03 NOTE — Progress Notes (Signed)
   09/03/23 1000  Psych Admission Type (Psych Patients Only)  Admission Status Involuntary  Psychosocial Assessment  Patient Complaints None  Eye Contact Fair  Facial Expression Animated;Anxious  Affect Anxious  Speech Therapist, music Cooperative;Anxious  Mood Labile;Anxious  Thought Process  Coherency WDL  Content Preoccupation  Delusions None reported or observed  Perception WDL  Hallucination None reported or observed  Judgment Poor  Confusion None  Danger to Self  Current suicidal ideation? Denies  Agreement Not to Harm Self Yes  Description of Agreement verbal  Danger to Others  Danger to Others None reported or observed

## 2023-09-03 NOTE — Plan of Care (Signed)
  Problem: Coping: Goal: Demonstration of participation in decision-making regarding own care will improve Outcome: Progressing Goal: Ability to use eye contact when communicating with others will improve Outcome: Progressing   

## 2023-09-03 NOTE — Group Note (Signed)
 Recreation Therapy Group Note   Group Topic:Health and Wellness  Group Date: 09/03/2023 Start Time: 1009 End Time: 1032 Facilitators: Miklo Aken-McCall, LRT,CTRS Location: 500 [address] Dayroom   Group Topic: Exercise/Wellness  Goal Area(s) Addresses:  Patient will verbalize benefit of exercise during group session. Patient will identify an exercise that can be completed post d/c. Patient will acknowledge benefits of exercise when used as a coping mechanism.   Behavioral Response: N/A  Intervention: Music  Activity: Exercise. LRT and patients discussed the importance of good physical health and how it pairs with mental and spiritual health. Patients and LRT took turn leading the group in the exercises of their choosing. The group aimed to complete at least 20 minutes of exercise. Patients could take breaks and get water  as needed.  Education: Physical Activity, Health and Wellness  Education Outcome: Acknowledges understanding/In group clarification offered/Needs additional education.    Affect/Mood: N/A   Participation Level: Did not attend    Clinical Observations/Individualized Feedback:     Plan: Continue to engage patient in RT group sessions 2-3x/week.   Rishabh Rinkenberger-McCall, LRT,CTRS 09/03/2023 11:02 AM

## 2023-09-03 NOTE — Progress Notes (Signed)
 1217 IM olanzapine  10 mg administered to right deltoid after patient began banging the phone on the hook and trying to rip the phone out of the wall. Patient became agitated when he was told that he would not be leaving today after he was told otherwise earlier in the day. Patient called his parents to update them and became irate when parents expressed relief. Patient began shouting "no one loves me." Patient had a cup of juice in the room and threw it on staff during medication administration. Patient then attempted to fill up a cup and throw it at staff again. All items were removed from room and patient went back to room. Patient is quiet in his room at this time.

## 2023-09-03 NOTE — Plan of Care (Signed)
   Problem: Education: Goal: Knowledge of [place] [facility] Education information/materials will improve Outcome: Progressing Goal: Emotional status will improve Outcome: Progressing Goal: Mental status will improve Outcome: Progressing

## 2023-09-04 DIAGNOSIS — F201 Disorganized schizophrenia: Secondary | ICD-10-CM | POA: Diagnosis not present

## 2023-09-04 MED ORDER — SODIUM CHLORIDE 0.9 % IN NEBU
INHALATION_SOLUTION | RESPIRATORY_TRACT | Status: AC
  Administered 2023-09-04: 5 mL
  Filled 2023-09-04: qty 3

## 2023-09-04 NOTE — Progress Notes (Signed)
   09/04/23 0900  Psych Admission Type (Psych Patients Only)  Admission Status Involuntary  Psychosocial Assessment  Patient Complaints None  Eye Contact Fair  Facial Expression Sad  Affect Sad;Preoccupied  Speech Logical/coherent  Interaction Assertive  Motor Activity Fidgety  Appearance/Hygiene Disheveled  Behavior Characteristics Anxious  Mood Labile  Thought Process  Coherency WDL  Content Preoccupation  Delusions None reported or observed  Perception WDL  Hallucination None reported or observed  Judgment Impaired  Confusion None  Danger to Self  Current suicidal ideation? Denies  Agreement Not to Harm Self Yes  Description of Agreement verbal  Danger to Others  Danger to Others None reported or observed

## 2023-09-04 NOTE — Plan of Care (Signed)
  Problem: Education: Goal: Emotional status will improve Outcome: Progressing Goal: Verbalization of understanding the information provided will improve Outcome: Progressing   Problem: Activity: Goal: Sleeping patterns will improve Outcome: Progressing   Problem: Coping: Goal: Ability to demonstrate self-control will improve Outcome: Progressing

## 2023-09-04 NOTE — Progress Notes (Signed)
 [facility] MD Progress Note  09/04/2023 2:58 PM Brendan Hogan  MRN:  [id number] Subjective:   28 year old African-American male who originally from [place]. Well-known to the services as he has presented on multiple occasions. History of schizoaffective disorder bipolar type. Typically will not adhere to treatment in the community. Current presentation was on account of agitation and aggressive behavior at home. Reported to be threatening his family. Delusional belief that his parents are no longer his parents. He thinks it is from [place]. Reported to be sexually disinhibited. Not redirectable on the unit. Reported to be hyperactive as he is exercising on the unit. He required emergency medications.  Chart reviewed today.  Patient discussed at multidisciplinary team meeting.  Nursing staff reports that patient slept for 7.5 hours.  He was frustrated about not being discharged yesterday.  He had behavioral issues.  Reported to have thrown juice on staff.  He has been adherent with his medications.  No PRNs required lately.  Seen today.  Patient disappointed that he did not have to leave yesterday.  Patient tells me that he feels good and he wants to leave.  He is not endorsing any form of hallucination today.  He is not endorsing any delusions.  No current rageful thoughts towards himself or towards anybody else.  No side effects from recent increasing dose of his medication.  I explained the rationale for delaying his discharge.  I did explain that the progress site for discharge will be maintaining appropriate behavior for at least 24 hours.  Patient plans to work towards this. Encouraged to keep ventilating his feelings to staff.   Principal Problem: Disorganized schizophrenia (HCC) Diagnosis: Principal Problem:   Disorganized schizophrenia (HCC) Active Problems:   Schizophrenia (HCC)  Total Time spent with patient: 30 minutes  Past Psychiatric History:  See H&P  Past Medical History:   Past Medical History:  Diagnosis Date   Disorganized schizophrenia (HCC)    Primary insomnia    Psychosis (HCC) 05/2019   [facility] [place]    Seasonal allergies     Past Surgical History:  Procedure Laterality Date   TONSILLECTOMY     Family History:  Family History  Problem Relation Age of Onset   Hypertension Mother    Family Psychiatric  History:  See H&P  Social History:  Social History   Substance and Sexual Activity  Alcohol Use Not Currently   Comment: rare     Social History   Substance and Sexual Activity  Drug Use No    Social History   Socioeconomic History   Marital status: Single    Spouse name: Not on file   Number of children: Not on file   Years of education: Not on file   Highest education level: Not on file  Occupational History   Not on file  Tobacco Use   Smoking status: Every Day   Smokeless tobacco: Former  Building services engineer status: Never Used  Substance and Sexual Activity   Alcohol use: Not Currently    Comment: rare   Drug use: No   Sexual activity: Not Currently  Other Topics Concern   Not on file  Social History Narrative   ** Merged History Encounter **       Social Drivers of Health   Financial Resource Strain: Not on file  Food Insecurity: Patient Declined (08/19/2023)   Hunger Vital Sign    Worried About Running Out of Food in the Last Year: Patient  declined    Barista in the Last Year: Patient declined  Transportation Needs: Patient Declined (08/19/2023)   PRAPARE - Administrator, Civil Service (Medical): Patient declined    Lack of Transportation (Non-Medical): Patient declined  Physical Activity: Not on file  Stress: Not on file  Social Connections: Not on file   Additional Social History:   Current Medications: Current Facility-Administered Medications  Medication Dose Route Frequency Provider Last Rate Last Admin   acetaminophen  (TYLENOL ) tablet 650 mg  650 mg Oral Q6H PRN  [name], NP   650 mg at 08/29/23 1237   alum & mag hydroxide-simeth (MAALOX/MYLANTA) 200-200-20 MG/5ML suspension 30 mL  30 mL Oral Q4H PRN [name], NP       benztropine  (COGENTIN ) tablet 0.5 mg  0.5 mg Oral BID [name], NP   0.5 mg at 09/03/23 0817   cephALEXin  (KEFLEX ) capsule 500 mg  500 mg Oral Q8H Nwoko, Agnes I, NP   500 mg at 09/04/23 1410   divalproex  (DEPAKOTE ) DR tablet 500 mg  500 mg Oral Q12H [name], NP   500 mg at 09/04/23 1610   feeding supplement (ENSURE ENLIVE / ENSURE PLUS) liquid 237 mL  237 mL Oral BID BM Nkwenti, Arzella Laurence, NP   237 mL at 09/04/23 1411   fluPHENAZine  (PROLIXIN ) tablet 5 mg  5 mg Oral BID Alexa Blish, Iline Mallory, MD   5 mg at 09/04/23 1125   Or   fluPHENAZine  (PROLIXIN ) injection 2.5 mg  2.5 mg Intramuscular BID Giannina Bartolome, Iline Mallory, MD       fluPHENAZine  (PROLIXIN ) tablet 10 mg  10 mg Oral QHS Makhari Dovidio, Iline Mallory, MD   10 mg at 09/03/23 2111   Or   fluPHENAZine  (PROLIXIN ) injection 5 mg  5 mg Intramuscular QHS Waleska Buttery, Iline Mallory, MD       hydrOXYzine  (ATARAX ) tablet 25 mg  25 mg Oral TID PRN [name], NP   25 mg at 09/02/23 2055   magnesium  hydroxide (MILK OF MAGNESIA) suspension 30 mL  30 mL Oral Daily PRN [name], NP       OLANZapine  (ZYPREXA ) injection 10 mg  10 mg Intramuscular TID PRN [name], NP   10 mg at 09/03/23 1217   OLANZapine  (ZYPREXA ) injection 5 mg  5 mg Intramuscular TID PRN [name], NP       OLANZapine  zydis (ZYPREXA ) disintegrating tablet 5 mg  5 mg Oral TID PRN [name], NP       propranolol  (INDERAL ) tablet 5 mg  5 mg Oral Q12H [name], NP   5 mg at 09/02/23 2057   silver  sulfADIAZINE  (SILVADENE ) 1 % cream   Topical Daily Nwoko, Agnes I, NP   1 Application at 09/04/23 9604   traZODone  (DESYREL ) tablet 100 mg  100 mg Oral QHS PRN Augusta Blizzard, MD   100 mg at 09/02/23 2055    Lab Results: No results found for this or any previous visit (from the past 48  hours).  Blood Alcohol level:  Lab Results  Component Value Date   [facility] <10 08/18/2023   ETH <10 06/09/2023    Metabolic Disorder Labs: Lab Results  Component Value Date   HGBA1C 4.8 08/21/2023   MPG 91.06 08/21/2023   MPG 105 12/09/2022   Lab Results  Component Value Date   PROLACTIN 26.5 (H) 09/08/2019   Lab Results  Component Value Date   CHOL 131 08/21/2023   TRIG 53 08/21/2023  HDL 43 08/21/2023   CHOLHDL 3.0 08/21/2023   VLDL 11 08/21/2023   LDLCALC 77 08/21/2023   LDLCALC 91 12/09/2022    Physical Findings: AIMS:  , ,  ,  ,    CIWA:    COWS:     Musculoskeletal: Strength & Muscle Tone: within normal limits Gait & Station: normal Patient leans: N/A  Psychiatric Specialty Exam:  Presentation  General Appearance and behavior:  Casually dressed, not in acute distress, better engagement.  No EPS.  Eye Contact: Good.  Speech: Spontaneous.  Not pressured or loud.  Mood and Affect  Mood: Subjectively and objectively better.  Affect: Mood congruent.  Thought Process  Thought Processes: Linear and goal directed.  Descriptions of Associations:Intact  Orientation:Full (Time, Place and Person)  Thought Content: Future oriented.  No current suicidal thoughts.  No homicidal thoughts.  No thoughts of violence.  No negative ruminative flooding.  No guilty ruminations.  No delusional theme.  No obsessions.  Hallucinations: No hallucination in any modality.  Sensorium  Memory: Good.  Judgment: Better.  Insight: Better.  Executive Functions  Concentration: Better.  Attention Span: Better.  Recall: Fair.  Fund of Knowledge: Fair.  Language: Good   Psychomotor Activity  Normalizing psychomotor activity   Physical Exam: Physical Exam ROS Blood pressure 101/71, pulse 89, temperature 97.7 F (36.5 C), temperature source Oral, resp. rate 16, height 5\' 5"  (1.651 m), weight 56.1 kg, SpO2 100%. Body mass index is 20.57  kg/m.   Treatment Plan Summary: Patient has tolerated recent adjustments made.  He is responding appropriately to treatment.  He is less frustrated today and able to tolerate the fact that he is not being discharged today.  No current dangerousness.  We will keep his medication the same and evaluate him further.  1.  Fluphenazine  5 mg twice daily. 2.  Fluphenazine  10 mg at bedtime. 3.  Depakote  ER 500 mg twice daily. 4.  Continue antibiotics for wound infection. 5.  Continue to monitor mood behavior and interaction with others. 6.  Continue to encourage unit groups and therapeutic activities. 7.  Social worker will coordinate discharge and aftercare planning.  Amelie Jury, MD 09/04/2023, 2:58 PM

## 2023-09-04 NOTE — Group Note (Signed)
 Date:  09/04/2023 Time:  8:39 PM  Group Topic/Focus:  Wrap-Up Group:   The focus of this group is to help patients review their daily goal of treatment and discuss progress on daily workbooks.    Participation Level:  Active  Participation Quality:  Appropriate and Attentive  Affect:  Appropriate  Cognitive:  Alert and Appropriate  Insight: Appropriate and Good  Engagement in Group:  Engaged  Modes of Intervention:  Discussion and Education  Additional Comments:  Pt attended and participated in wrap up group this evening and rated their day a 7/10. Pt stated that they had a good day and went to all the scheduled groups. Pt is anticipating being D/C and has plans to get a job. Pt has no complaints at this time.   Eligah Grow 09/04/2023, 8:39 PM

## 2023-09-04 NOTE — Group Note (Signed)
 Occupational Therapy Group Note  Group Topic: Sleep Hygiene  Group Date: 09/04/2023 Start Time: 1431 End Time: 1558 Facilitators: Lynnda Sas, OT   Group Description: Group encouraged increased participation and engagement through topic focused on sleep hygiene. Patients reflected on the quality of sleep they typically receive and identified areas that need improvement. Group was given background information on sleep and sleep hygiene, including common sleep disorders. Group members also received information on how to improve one's sleep and introduced a sleep diary as a tool that can be utilized to track sleep quality over a length of time. Group session ended with patients identifying one or more strategies they could utilize or implement into their sleep routine in order to improve overall sleep quality.        Therapeutic Goal(s):  Identify one or more strategies to improve overall sleep hygiene  Identify one or more areas of sleep that are negatively impacted (sleep too much, too little, etc)     Participation Level: Engaged   Participation Quality: Moderate Cues   Behavior: Distracted   Speech/Thought Process: Disorganized   Affect/Mood: Flat   Insight: Limited   Judgement: Limited      Modes of Intervention: Education  Patient Response to Interventions:  Disengaged   Plan: Continue to engage patient in OT groups 2 - 3x/week.  09/04/2023  Lynnda Sas, OT  Henchy Mccauley, OT

## 2023-09-04 NOTE — Progress Notes (Signed)
 Patient is complaint with medication pacing in his room this evening he is irritable and agitated stated that he was disappointed about his Parents not taking him home today stated that they will have to take him tomorrow as they have no option. Support and encouragement provided.  Q 15 minutes safety checks ongoing.

## 2023-09-04 NOTE — [facility] Group Notes (Signed)
 Adult Psychoeducational Group Note  Date:  09/04/2023 Time:  6:18 PM  Group Topic/Focus:  Goals Group:   The focus of this group is to help patients establish daily goals to achieve during treatment and discuss how the patient can incorporate goal setting into their daily lives to aide in recovery. Orientation:   The focus of this group is to educate the patient on the purpose and policies of crisis stabilization and provide a format to answer questions about their admission.  The group details unit policies and expectations of patients while admitted.  Participation Level:  Did Not Attend  Participation Quality:    Affect:    Cognitive:    Insight:   Engagement in Group:    Modes of Intervention:    Additional Comments:    Kimberlee Shoun O 09/04/2023, 6:18 PM

## 2023-09-05 DIAGNOSIS — F201 Disorganized schizophrenia: Secondary | ICD-10-CM | POA: Diagnosis not present

## 2023-09-05 NOTE — Progress Notes (Signed)
   09/05/23 0845  Psych Admission Type (Psych Patients Only)  Admission Status Involuntary  Psychosocial Assessment  Patient Complaints None  Eye Contact Fair  Facial Expression Animated  Affect Appropriate to circumstance  Speech Logical/coherent  Interaction Assertive  Motor Activity Fidgety  Appearance/Hygiene Disheveled  Behavior Characteristics Cooperative  Mood Pleasant  Thought Process  Coherency WDL  Content WDL  Delusions None reported or observed  Perception WDL  Hallucination None reported or observed  Judgment Poor  Confusion None  Danger to Self  Current suicidal ideation? Denies  Agreement Not to Harm Self Yes  Description of Agreement verbal  Danger to Others  Danger to Others None reported or observed

## 2023-09-05 NOTE — Progress Notes (Signed)
 [facility] MD Progress Note  09/05/2023 12:49 PM Brendan Hogan  MRN:  [id number] Subjective:   28 year old African-American male who originally from [place]. Well-known to the services as he has presented on multiple occasions. History of schizoaffective disorder bipolar type. Typically will not adhere to treatment in the community. Current presentation was on account of agitation and aggressive behavior at home. Reported to be threatening his family. Delusional belief that his parents are no longer his parents. He thinks it is from [place]. Reported to be sexually disinhibited. Not redirectable on the unit. Reported to be hyperactive as he is exercising on the unit. He required emergency medications.  Chart reviewed today.  Patient discussed at multidisciplinary team meeting.  Staff reports that patient has been more appropriate on the unit.  No challenging behavior lately.  He slept well last night.  He has been taking his medicines as recommended.  He is eats his meals.  No observed response to internal stimuli.  Seen today.  Patient tells me that he is doing well.  States that he did not get into any altercation with anyone.  He feels good and ready to go home.  He is not endorsing any form of hallucination.  He is not endorsing any delusion.  He is able to think clearly.  He is not endorsing any form of external interference with his thought process.  No rageful thoughts.  No suicidal thoughts.  States that he has been in communication with his family. Encouraged to keep ventilating his feelings to staff.   Principal Problem: Disorganized schizophrenia (HCC) Diagnosis: Principal Problem:   Disorganized schizophrenia (HCC) Active Problems:   Schizophrenia (HCC)  Total Time spent with patient: 30 minutes  Past Psychiatric History:  See H&P  Past Medical History:  Past Medical History:  Diagnosis Date   Disorganized schizophrenia (HCC)    Primary insomnia    Psychosis (HCC) 05/2019    [facility]    Seasonal allergies     Past Surgical History:  Procedure Laterality Date   TONSILLECTOMY     Family History:  Family History  Problem Relation Age of Onset   Hypertension Mother    Family Psychiatric  History:  See H&P  Social History:  Social History   Substance and Sexual Activity  Alcohol Use Not Currently   Comment: rare     Social History   Substance and Sexual Activity  Drug Use No    Social History   Socioeconomic History   Marital status: Single    Spouse name: Not on file   Number of children: Not on file   Years of education: Not on file   Highest education level: Not on file  Occupational History   Not on file  Tobacco Use   Smoking status: Every Day   Smokeless tobacco: Former  Building services engineer status: Never Used  Substance and Sexual Activity   Alcohol use: Not Currently    Comment: rare   Drug use: No   Sexual activity: Not Currently  Other Topics Concern   Not on file  Social History Narrative   ** Merged History Encounter **       Social Drivers of Health   Financial Resource Strain: Not on file  Food Insecurity: Patient Declined (08/19/2023)   Hunger Vital Sign    Worried About Running Out of Food in the Last Year: Patient declined    Ran Out of Food in the Last Year: Patient  declined  Transportation Needs: Patient Declined (08/19/2023)   PRAPARE - Administrator, Civil Service (Medical): Patient declined    Lack of Transportation (Non-Medical): Patient declined  Physical Activity: Not on file  Stress: Not on file  Social Connections: Not on file   Additional Social History:   Current Medications: Current Facility-Administered Medications  Medication Dose Route Frequency Provider Last Rate Last Admin   acetaminophen  (TYLENOL ) tablet 650 mg  650 mg Oral Q6H PRN [name], NP   650 mg at 08/29/23 1237   alum & mag hydroxide-simeth (MAALOX/MYLANTA) 200-200-20 MG/5ML suspension 30 mL   30 mL Oral Q4H PRN [name], NP       benztropine  (COGENTIN ) tablet 0.5 mg  0.5 mg Oral BID [name], NP   0.5 mg at 09/03/23 0817   cephALEXin  (KEFLEX ) capsule 500 mg  500 mg Oral Q8H Nwoko, Devra Fontana I, NP   500 mg at 09/05/23 1610   divalproex  (DEPAKOTE ) DR tablet 500 mg  500 mg Oral Q12H [name], NP   500 mg at 09/05/23 0845   feeding supplement (ENSURE ENLIVE / ENSURE PLUS) liquid 237 mL  237 mL Oral BID BM Robet Chiquito, NP   237 mL at 09/05/23 0914   fluPHENAZine  (PROLIXIN ) tablet 5 mg  5 mg Oral BID Torrance Stockley, Iline Mallory, MD   5 mg at 09/05/23 1155   Or   fluPHENAZine  (PROLIXIN ) injection 2.5 mg  2.5 mg Intramuscular BID Abanoub Hanken, Iline Mallory, MD       fluPHENAZine  (PROLIXIN ) tablet 10 mg  10 mg Oral QHS Seriyah Collison, Iline Mallory, MD   10 mg at 09/04/23 1655   Or   fluPHENAZine  (PROLIXIN ) injection 5 mg  5 mg Intramuscular QHS Coreena Rubalcava, Iline Mallory, MD       hydrOXYzine  (ATARAX ) tablet 25 mg  25 mg Oral TID PRN [name], NP   25 mg at 09/02/23 2055   magnesium  hydroxide (MILK OF MAGNESIA) suspension 30 mL  30 mL Oral Daily PRN [name], NP       OLANZapine  (ZYPREXA ) injection 10 mg  10 mg Intramuscular TID PRN [name], NP   10 mg at 09/03/23 1217   OLANZapine  (ZYPREXA ) injection 5 mg  5 mg Intramuscular TID PRN [name], NP       OLANZapine  zydis (ZYPREXA ) disintegrating tablet 5 mg  5 mg Oral TID PRN [name], NP       propranolol  (INDERAL ) tablet 5 mg  5 mg Oral Q12H [name], NP   5 mg at 09/02/23 2057   silver  sulfADIAZINE  (SILVADENE ) 1 % cream   Topical Daily Nwoko, Agnes I, NP   1 Application at 09/05/23 0844   traZODone  (DESYREL ) tablet 100 mg  100 mg Oral QHS PRN Augusta Blizzard, MD   100 mg at 09/02/23 2055    Lab Results: No results found for this or any previous visit (from the past 48 hours).  Blood Alcohol level:  Lab Results  Component Value Date   ETH <10 08/18/2023   ETH <10 06/09/2023    Metabolic Disorder  Labs: Lab Results  Component Value Date   HGBA1C 4.8 08/21/2023   MPG 91.06 08/21/2023   MPG 105 12/09/2022   Lab Results  Component Value Date   PROLACTIN 26.5 (H) 09/08/2019   Lab Results  Component Value Date   CHOL 131 08/21/2023   TRIG 53 08/21/2023   HDL 43 08/21/2023   CHOLHDL 3.0 08/21/2023   VLDL 11 08/21/2023  LDLCALC 77 08/21/2023   LDLCALC 91 12/09/2022    Physical Findings: AIMS:  , ,  ,  ,    CIWA:    COWS:     Musculoskeletal: Strength & Muscle Tone: within normal limits Gait & Station: normal Patient leans: N/A  Psychiatric Specialty Exam:  Presentation  General Appearance and behavior:  Casually dressed, not in acute distress, good rapport.  No EPS.  Eye Contact: Good.  Speech: Spontaneous.  Not pressured or loud.  Mood and Affect  Mood: Subjectively and objectively better.  Affect: 4 range and appropriate. Thought Process  Thought Processes: Linear and goal directed.  Descriptions of Associations:Intact  Orientation:Full (Time, Place and Person)  Thought Content: Future oriented.  No current suicidal thoughts.  No homicidal thoughts.  No thoughts of violence.  No negative ruminative flooding.  No guilty ruminations.  No delusional theme.  No obsessions.  Hallucinations: No hallucination in any modality.  Sensorium  Memory: Good.  Judgment: Better.  Insight: Better.  Executive Functions  Concentration: Good.  Attention Span: Good.  Recall: Good.  Fund of Knowledge: Fair.  Language: Good   Psychomotor Activity  Normalizing psychomotor activity   Physical Exam: Physical Exam ROS Blood pressure 100/63, pulse 93, temperature (!) 97.4 F (36.3 C), temperature source Oral, resp. rate 16, height 5\' 5"  (1.651 m), weight 56.1 kg, SpO2 96%. Body mass index is 20.57 kg/m.   Treatment Plan Summary: Patient is doing better.  He is tolerating his medications well.  His mood is stabilizing appropriately.  No  dangerousness.  We will keep his medications at the same dose and finalize aftercare.  Hopeful discharge tomorrow if he maintains stability.  1.  Fluphenazine  5 mg twice daily. 2.  Fluphenazine  10 mg at bedtime. 3.  Depakote  ER 500 mg twice daily. 4.  Continue antibiotics for wound infection. 5.  Continue to monitor mood behavior and interaction with others. 6.  Continue to encourage unit groups and therapeutic activities. 7.  Social worker will coordinate discharge and aftercare planning.  Amelie Jury, MD 09/05/2023, 12:49 PM

## 2023-09-05 NOTE — Group Note (Signed)
 Date:  09/05/2023 Time:  8:25 PM  Group Topic/Focus:  Wrap-Up Group:   The focus of this group is to help patients review their daily goal of treatment and discuss progress on daily workbooks.    Participation Level:  Active  Participation Quality:  Appropriate  Affect:  Appropriate  Cognitive:  Appropriate  Insight: Appropriate  Engagement in Group:  Engaged  Modes of Intervention:  Education and Exploration  Additional Comments:  Patient attended and participated in group tonight.  He reports that his goal was to not get into trouble.  He meet his goal,  did not get in trouble today.  Eugena Herter Dacosta 09/05/2023, 8:25 PM

## 2023-09-05 NOTE — Plan of Care (Signed)
   Problem: Education: Goal: Knowledge of [facility] Education information/materials will improve Outcome: Progressing Goal: Emotional status will improve Outcome: Progressing Goal: Mental status will improve Outcome: Progressing Goal: Verbalization of understanding the information provided will improve Outcome: Progressing

## 2023-09-05 NOTE — Progress Notes (Signed)
   09/05/23 2300  Psych Admission Type (Psych Patients Only)  Admission Status Involuntary  Psychosocial Assessment  Patient Complaints None  Eye Contact Fair  Facial Expression Animated  Affect Appropriate to circumstance  Speech Logical/coherent  Interaction Assertive  Motor Activity Fidgety  Appearance/Hygiene Unremarkable  Behavior Characteristics Cooperative  Mood Pleasant  Thought Process  Coherency WDL  Content WDL  Delusions None reported or observed  Perception WDL  Hallucination None reported or observed  Judgment Poor  Confusion None  Danger to Self  Current suicidal ideation? Denies  Agreement Not to Harm Self Yes  Description of Agreement Verbal  Danger to Others  Danger to Others None reported or observed

## 2023-09-05 NOTE — [facility] Group Notes (Addendum)
 Spirituality Group   Description: Participant directed exploration of values, beliefs and meaning   Following a brief framework of chaplain's role and ground rules of group behavior, participants are invited to share concerns or questions that engage spiritual life. Emphasis placed on common themes and shared experiences and ways to make meaning and clarify living into one's values.   Theory/Process/Goal: Utilize the theoretical framework of group therapy established by Derrell Flight, Relational Cultural Theory and Rogerian approaches to facilitate relational empathy and use of the "here and now" to foster reflection, self-awareness, and sharing.   Observations: Brendan Hogan was present for about half of the group time. He was pleasant and more responsive when addressed directly than in past encounters. He eventually left and never really joined in the conversation but was very polite. This chaplain offered ongoing support at a later time if desired.

## 2023-09-06 DIAGNOSIS — F201 Disorganized schizophrenia: Secondary | ICD-10-CM | POA: Diagnosis not present

## 2023-09-06 MED ORDER — SILVER SULFADIAZINE 1 % EX CREA: TOPICAL_CREAM | Freq: Every day | CUTANEOUS | 0 refills | Status: AC

## 2023-09-06 MED ORDER — FLUPHENAZINE HCL 5 MG PO TABS
5.0000 mg | ORAL_TABLET | Freq: Two times a day (BID) | ORAL | 0 refills | Status: AC
Start: 2023-09-06 — End: ?

## 2023-09-06 MED ORDER — DIVALPROEX SODIUM 500 MG PO DR TAB
500.0000 mg | DELAYED_RELEASE_TABLET | Freq: Two times a day (BID) | ORAL | 0 refills | Status: AC
Start: 2023-09-06 — End: ?

## 2023-09-06 MED ORDER — FLUPHENAZINE HCL 10 MG PO TABS: 10.0000 mg | ORAL_TABLET | Freq: Every day | ORAL | 0 refills | Status: AC

## 2023-09-06 MED ORDER — BENZTROPINE MESYLATE 0.5 MG PO TABS: 0.5000 mg | ORAL_TABLET | Freq: Two times a day (BID) | ORAL | 0 refills | Status: AC

## 2023-09-06 NOTE — Progress Notes (Signed)
   09/06/23 0800  Psych Admission Type (Psych Patients Only)  Admission Status Involuntary  Psychosocial Assessment  Patient Complaints None  Eye Contact Fair  Facial Expression Animated  Affect Appropriate to circumstance  Speech Logical/coherent  Interaction Minimal  Motor Activity Fidgety  Appearance/Hygiene Unremarkable  Behavior Characteristics Cooperative  Mood Pleasant  Thought Process  Coherency WDL  Content WDL  Delusions None reported or observed  Perception WDL  Hallucination None reported or observed  Judgment Poor  Confusion None  Danger to Self  Current suicidal ideation? Denies  Agreement Not to Harm Self Yes  Description of Agreement Verbal  Danger to Others  Danger to Others None reported or observed

## 2023-09-06 NOTE — Plan of Care (Signed)
 Problem: Education: Goal: Knowledge of [facility] Education information/materials will improve Outcome: Adequate for Discharge Goal: Emotional status will improve Outcome: Adequate for Discharge Goal: Mental status will improve Outcome: Adequate for Discharge Goal: Verbalization of understanding the information provided will improve Outcome: Adequate for Discharge   Problem: Activity: Goal: Interest or engagement in activities will improve Outcome: Adequate for Discharge Goal: Sleeping patterns will improve Outcome: Adequate for Discharge   Problem: Coping: Goal: Ability to verbalize frustrations and anger appropriately will improve Outcome: Adequate for Discharge Goal: Ability to demonstrate self-control will improve Outcome: Adequate for Discharge   Problem: Health Behavior/Discharge Planning: Goal: Identification of resources available to assist in meeting health care needs will improve Outcome: Adequate for Discharge Goal: Compliance with treatment plan for underlying cause of condition will improve Outcome: Adequate for Discharge   Problem: Physical Regulation: Goal: Ability to maintain clinical measurements within normal limits will improve Outcome: Adequate for Discharge   Problem: Safety: Goal: Periods of time without injury will increase Outcome: Adequate for Discharge   Problem: Activity: Goal: Will verbalize the importance of balancing activity with adequate rest periods Outcome: Adequate for Discharge   Problem: Education: Goal: Will be free of psychotic symptoms Outcome: Adequate for Discharge Goal: Knowledge of the prescribed therapeutic regimen will improve Outcome: Adequate for Discharge   Problem: Coping: Goal: Coping ability will improve Outcome: Adequate for Discharge Goal: Will verbalize feelings Outcome: Adequate for Discharge   Problem: Health Behavior/Discharge Planning: Goal: Compliance with prescribed medication regimen will  improve Outcome: Adequate for Discharge   Problem: Nutritional: Goal: Ability to achieve adequate nutritional intake will improve Outcome: Adequate for Discharge   Problem: Role Relationship: Goal: Ability to communicate needs accurately will improve Outcome: Adequate for Discharge Goal: Ability to interact with others will improve Outcome: Adequate for Discharge   Problem: Safety: Goal: Ability to redirect hostility and anger into socially appropriate behaviors will improve Outcome: Adequate for Discharge Goal: Ability to remain free from injury will improve Outcome: Adequate for Discharge   Problem: Self-Care: Goal: Ability to participate in self-care as condition permits will improve Outcome: Adequate for Discharge   Problem: Self-Concept: Goal: Will verbalize positive feelings about self Outcome: Adequate for Discharge   Problem: Education: Goal: Knowledge of [facility] Education information/materials will improve Outcome: Adequate for Discharge Goal: Emotional status will improve Outcome: Adequate for Discharge Goal: Mental status will improve Outcome: Adequate for Discharge Goal: Verbalization of understanding the information provided will improve Outcome: Adequate for Discharge   Problem: Activity: Goal: Interest or engagement in activities will improve Outcome: Adequate for Discharge Goal: Sleeping patterns will improve Outcome: Adequate for Discharge   Problem: Coping: Goal: Ability to verbalize frustrations and anger appropriately will improve Outcome: Adequate for Discharge Goal: Ability to demonstrate self-control will improve Outcome: Adequate for Discharge   Problem: Health Behavior/Discharge Planning: Goal: Identification of resources available to assist in meeting health care needs will improve Outcome: Adequate for Discharge Goal: Compliance with treatment plan for underlying cause of condition will improve Outcome: Adequate for  Discharge   Problem: Physical Regulation: Goal: Ability to maintain clinical measurements within normal limits will improve Outcome: Adequate for Discharge   Problem: Safety: Goal: Periods of time without injury will increase Outcome: Adequate for Discharge   Problem: Education: Goal: Ability to incorporate positive changes in behavior to improve self-esteem will improve Outcome: Adequate for Discharge   Problem: Health Behavior/Discharge Planning: Goal: Ability to identify and utilize available resources and services will improve Outcome: Adequate  for Discharge Goal: Ability to remain free from injury will improve Outcome: Adequate for Discharge   Problem: Self-Concept: Goal: Will verbalize positive feelings about self Outcome: Adequate for Discharge   Problem: Skin Integrity: Goal: Demonstration of wound healing without infection will improve Outcome: Adequate for Discharge   Problem: Activity: Goal: Will identify at least one activity in which they can participate Outcome: Adequate for Discharge   Problem: Coping: Goal: Ability to identify and develop effective coping behavior will improve Outcome: Adequate for Discharge Goal: Ability to interact with others will improve Outcome: Adequate for Discharge Goal: Demonstration of participation in decision-making regarding own care will improve Outcome: Adequate for Discharge Goal: Ability to use eye contact when communicating with others will improve Outcome: Adequate for Discharge   Problem: Health Behavior/Discharge Planning: Goal: Identification of resources available to assist in meeting health care needs will improve Outcome: Adequate for Discharge
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
## 2023-09-06 NOTE — BHH Suicide Risk Assessment (Signed)
 [facility] Discharge Suicide Risk Assessment   Principal Problem: Disorganized schizophrenia [facility]) Discharge Diagnoses: Principal Problem:   Disorganized schizophrenia (HCC) Active Problems:   Schizophrenia (HCC)   Total Time spent with patient: 30 minutes  Musculoskeletal: Strength & Muscle Tone: within normal limits Gait & Station: normal Patient leans: N/A  Psychiatric Specialty Exam  Presentation  General Appearance and behavior:  Casually dressed, slim build, not in any distress, appropriate behavior, engaged politely.  No EPS.  Eye Contact: Good.  Speech: Spontaneous.  Normal rate, tone and volume.  Normal prosody of speech.  Mood and Affect  Mood: Euthymic.  Affect: Full range and appropriate.  Thought Process  Thought Processes: Linear and goal directed.  Descriptions of Associations:Intact  Orientation:Full (Time, Place and Person)  Thought Content: Future-oriented.  No current suicidal thoughts.  No homicidal thoughts.  No thoughts of violence.  No negative ruminative flooding.  No guilty ruminations.  No delusional theme.  No obsessions.  Hallucinations: No hallucination in any modality.  Sensorium  Memory: Good.  Judgment: Good.  Insight: Better.  Executive Functions  Concentration: Good.  Attention Span: Good.  Recall: Good.  Fund of Knowledge: Good.  Language: Good   Psychomotor Activity  Normal psychomotor activity   Physical Exam: Physical Exam ROS Blood pressure 93/62, pulse 81, temperature 97.7 F (36.5 C), temperature source Oral, resp. rate 16, height 5\' 5"  (1.651 m), weight 56.1 kg, SpO2 98%. Body mass index is 20.57 kg/m.  Mental Status Per Nursing Assessment::   On Admission:  NA  Demographic Factors:  Male, Adolescent or young adult, and Unemployed  Loss Factors: Financial problems/change in socioeconomic status  Historical Factors: Family history of mental illness or substance abuse and  Impulsivity  Risk Reduction Factors:   Sense of responsibility to family and Living with another person, especially a relative  Continued Clinical Symptoms:  Psychosis and mania has resolved.  Patient is tolerating his medicines well.  No overwhelming anxiety.  No evidence of depression.  Sleep-wake cycle is well regularized.  Cognitive Features That Contribute To Risk:  None    Suicide Risk:  Minimal: No current suicidal thoughts.  No homicidal thoughts.  No thoughts of violence.  Modifiable risk factor targeted during this admission as psychosis and mania.  Patient is currently mood stabilizers and antipsychotic medication.  No new psychosocial stressor.  He has good support from his family. At this point in time, patient is stable for care at the lower setting.   Follow-up Information     [facility] Go on 09/16/2023.   Specialty: Professional Counselor Why: Please go to this provider on 09/16/23 at 9:00 am for an assessment, to obtain therapy services. You may also go on Monday through Friday, from 9 am to 1 pm. Contact information: [address]  [address] [place] [address] [phone]         [facility] Go on 09/30/2023.   Specialty: Behavioral Health Why: Please go to this provider on 09/30/23 at 2 pm for an assessment, to obtain medication management services. Contact information: [address] [place]  [address] [phone]                Plan Of Care/Follow-up recommendations:  See discharge summary  Amelie Jury, MD 09/06/2023, 10:05 AM

## 2023-09-06 NOTE — Progress Notes (Signed)
  [facility] Adult Case Management Discharge Plan :  Will you be returning to the same living situation after discharge:  Yes,  The patient is going back home At discharge, do you have transportation home?: Yes,  The patient's father will be picking him up Do you have the ability to pay for your medications: Yes,  The patient stated that he has insurance and his father will help him  Release of information consent forms completed and in the chart;  Patient's signature needed at discharge.  Patient to Follow up at:  Follow-up Information     [facility] Go on 09/16/2023.   Specialty: Professional Counselor Why: Please go to this provider on 09/16/23 at 9:00 am for an assessment, to obtain therapy services. You may also go on Monday through Friday, from 9 am to 1 pm. Contact information: [address]  [address] [place] [address] [phone]         [facility]. Go on 09/30/2023.   Specialty: Behavioral Health Why: Please go to this provider on 09/30/23 at 2 pm for an assessment, to obtain medication management services. Contact information: [address] [place]  [id number] [phone]                Next level of care provider has access to [facility] Link:no  Safety Planning and Suicide Prevention discussed: Yes,  CSW spoke to father     Has patient been referred to the Quitline?: Patient refused referral for treatment  Patient has been referred for addiction treatment: Patient refused referral for treatment; referral information given to patient at discharge.  Melaya Hoselton O Zamari Vea, LCSWA 09/06/2023, 10:39 AM

## 2023-09-07 ENCOUNTER — Other Ambulatory Visit: Payer: Self-pay

## 2023-09-07 ENCOUNTER — Encounter (HOSPITAL_COMMUNITY): Payer: Self-pay | Admitting: *Deleted

## 2023-09-07 ENCOUNTER — Emergency Department (HOSPITAL_COMMUNITY)
Admission: EM | Admit: 2023-09-07 | Discharge: 2023-09-07 | Disposition: A | Discharge status: Home / Self Care | Payer: Self-pay | Attending: Emergency Medicine | Admitting: Emergency Medicine

## 2023-09-07 DIAGNOSIS — G4700 Insomnia, unspecified: Secondary | ICD-10-CM | POA: Insufficient documentation
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
## 2023-09-07 NOTE — ED Triage Notes (Signed)
 Pt says he "has not slept in 1 day". D/c from [facility] last night, did not take his medications since leaving "because its not good for me" Denies SI/HI or hallucinations.

## 2023-09-07 NOTE — Discharge Instructions (Addendum)
 Recommend that you follow-up with behavioral health.  Recommended you take the medication as a when she is take.  Would consider over-the-counter melatonin for help with sleep.

## 2023-09-08 ENCOUNTER — Ambulatory Visit (HOSPITAL_COMMUNITY): Payer: PRIVATE HEALTH INSURANCE | Admitting: Psychiatry

## 2023-09-30 ENCOUNTER — Ambulatory Visit (HOSPITAL_COMMUNITY): Admitting: Psychiatry

## 2024-02-19 ENCOUNTER — Encounter (HOSPITAL_COMMUNITY): Payer: Self-pay

## 2024-02-19 ENCOUNTER — Emergency Department (HOSPITAL_COMMUNITY)
Admission: EM | Admit: 2024-02-19 | Discharge: 2024-02-19 | Payer: Self-pay | Attending: Emergency Medicine | Admitting: Emergency Medicine

## 2024-02-19 ENCOUNTER — Emergency Department (HOSPITAL_COMMUNITY): Payer: Self-pay

## 2024-02-19 ENCOUNTER — Other Ambulatory Visit: Payer: Self-pay

## 2024-02-19 DIAGNOSIS — L03011 Cellulitis of right finger: Secondary | ICD-10-CM | POA: Insufficient documentation

## 2024-02-19 DIAGNOSIS — W503XXA Accidental bite by another person, initial encounter: Secondary | ICD-10-CM | POA: Insufficient documentation

## 2024-02-19 MED ORDER — AMOXICILLIN-POT CLAVULANATE 875-125 MG PO TABS: 1.0000 | ORAL_TABLET | Freq: Two times a day (BID) | ORAL | 0 refills | Status: AC

## 2024-02-19 MED ORDER — AMOXICILLIN-POT CLAVULANATE 875-125 MG PO TABS
1.0000 | ORAL_TABLET | Freq: Once | ORAL | Status: AC
  Administered 2024-02-19: 1 via ORAL
  Filled 2024-02-19: qty 1

## 2024-02-19 MED ORDER — TETANUS-DIPHTH-ACELL PERTUSSIS 5-2-15.5 LF-MCG/0.5 IM SUSP: 0.5000 mL | Freq: Once | INTRAMUSCULAR | Status: DC
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
## 2024-02-19 NOTE — Discharge Instructions (Addendum)
 Brendan Hogan:  Thank you for allowing us  to take care of you today.  We hope you begin feeling better soon. You were seen today for right finger infection.  While you are here we performed a physical examination as well as imaging studies.  There was no evidence of deep or bone infection.   you have evidence of a skin infection called cellulitis.  Please take Augmentin (amoxicillin/clavulanate) twice a day for 10 days.  To-Do:  Please follow-up with your primary doctor within the next 2-3 days. It is important that you review any labs or imaging results (if any) that you had today with them. Your preliminary imaging results (if any) are attached. Please return to the Emergency Department or call 911 if you experience chest pain, shortness of breath, severe pain, severe fever, altered mental status, or have any reason to think that you need emergency medical care.  Thank you again.  Hope you feel better soon.  Department of Emergency Medicine
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
